# Patient Record
Sex: Female | Born: 1960 | Race: White | Hispanic: No | Marital: Single | Smoking: Never smoker
Health system: Southern US, Community
[De-identification: ages and names within clinical notes are randomized; demographics above are authoritative.]

## PROBLEM LIST (undated history)

## (undated) DIAGNOSIS — F419 Anxiety disorder, unspecified: Secondary | ICD-10-CM

## (undated) DIAGNOSIS — L309 Dermatitis, unspecified: Secondary | ICD-10-CM

## (undated) DIAGNOSIS — G709 Myoneural disorder, unspecified: Secondary | ICD-10-CM

## (undated) DIAGNOSIS — I890 Lymphedema, not elsewhere classified: Secondary | ICD-10-CM

## (undated) DIAGNOSIS — R51 Headache: Secondary | ICD-10-CM

## (undated) DIAGNOSIS — F32A Depression, unspecified: Secondary | ICD-10-CM

## (undated) DIAGNOSIS — C50919 Malignant neoplasm of unspecified site of unspecified female breast: Secondary | ICD-10-CM

## (undated) DIAGNOSIS — G4730 Sleep apnea, unspecified: Secondary | ICD-10-CM

## (undated) DIAGNOSIS — R350 Frequency of micturition: Secondary | ICD-10-CM

## (undated) DIAGNOSIS — R519 Headache, unspecified: Secondary | ICD-10-CM

## (undated) DIAGNOSIS — E079 Disorder of thyroid, unspecified: Secondary | ICD-10-CM

## (undated) DIAGNOSIS — K219 Gastro-esophageal reflux disease without esophagitis: Secondary | ICD-10-CM

## (undated) DIAGNOSIS — E669 Obesity, unspecified: Secondary | ICD-10-CM

## (undated) DIAGNOSIS — G629 Polyneuropathy, unspecified: Secondary | ICD-10-CM

## (undated) DIAGNOSIS — F329 Major depressive disorder, single episode, unspecified: Secondary | ICD-10-CM

## (undated) HISTORY — DX: Anxiety disorder, unspecified: F41.9

## (undated) HISTORY — DX: Obesity, unspecified: E66.9

## (undated) HISTORY — DX: Depression, unspecified: F32.A

## (undated) HISTORY — DX: Dermatitis, unspecified: L30.9

## (undated) HISTORY — DX: Headache: R51

## (undated) HISTORY — DX: Frequency of micturition: R35.0

## (undated) HISTORY — PX: PORTACATH PLACEMENT: SHX2246

## (undated) HISTORY — PX: OOPHORECTOMY: SHX86

## (undated) HISTORY — DX: Myoneural disorder, unspecified: G70.9

## (undated) HISTORY — DX: Major depressive disorder, single episode, unspecified: F32.9

## (undated) HISTORY — PX: OTHER SURGICAL HISTORY: SHX169

## (undated) HISTORY — DX: Malignant neoplasm of unspecified site of unspecified female breast: C50.919

## (undated) HISTORY — DX: Sleep apnea, unspecified: G47.30

## (undated) HISTORY — DX: Gastro-esophageal reflux disease without esophagitis: K21.9

## (undated) HISTORY — DX: Disorder of thyroid, unspecified: E07.9

## (undated) HISTORY — DX: Headache, unspecified: R51.9

---

## 2002-11-04 ENCOUNTER — Other Ambulatory Visit: Admission: RE | Admit: 2002-11-04 | Discharge: 2002-11-04 | Payer: Self-pay | Admitting: Gynecology

## 2003-11-18 ENCOUNTER — Other Ambulatory Visit: Admission: RE | Admit: 2003-11-18 | Discharge: 2003-11-18 | Payer: Self-pay | Admitting: Gynecology

## 2003-11-18 ENCOUNTER — Ambulatory Visit (HOSPITAL_COMMUNITY): Admission: RE | Admit: 2003-11-18 | Discharge: 2003-11-18 | Payer: Self-pay | Admitting: Gynecology

## 2004-10-31 ENCOUNTER — Other Ambulatory Visit: Admission: RE | Admit: 2004-10-31 | Discharge: 2004-10-31 | Payer: Self-pay | Admitting: Family Medicine

## 2005-07-13 ENCOUNTER — Emergency Department (HOSPITAL_COMMUNITY): Admission: EM | Admit: 2005-07-13 | Discharge: 2005-07-13 | Payer: Self-pay | Admitting: Emergency Medicine

## 2005-07-14 ENCOUNTER — Emergency Department (HOSPITAL_COMMUNITY): Admission: EM | Admit: 2005-07-14 | Discharge: 2005-07-14 | Payer: Self-pay | Admitting: Emergency Medicine

## 2010-09-19 ENCOUNTER — Encounter: Admission: RE | Admit: 2010-09-19 | Discharge: 2010-09-19 | Payer: Self-pay | Admitting: Family Medicine

## 2010-10-25 ENCOUNTER — Encounter
Admission: RE | Admit: 2010-10-25 | Discharge: 2010-10-25 | Payer: Self-pay | Source: Home / Self Care | Attending: Obstetrics and Gynecology | Admitting: Obstetrics and Gynecology

## 2010-11-15 ENCOUNTER — Encounter
Admission: RE | Admit: 2010-11-15 | Discharge: 2010-11-15 | Payer: Self-pay | Source: Home / Self Care | Attending: Obstetrics and Gynecology | Admitting: Obstetrics and Gynecology

## 2010-11-22 ENCOUNTER — Ambulatory Visit: Payer: Self-pay | Admitting: Oncology

## 2010-11-25 ENCOUNTER — Ambulatory Visit (HOSPITAL_COMMUNITY)
Admission: RE | Admit: 2010-11-25 | Discharge: 2010-11-25 | Payer: Self-pay | Source: Home / Self Care | Attending: Surgery | Admitting: Surgery

## 2010-11-28 ENCOUNTER — Encounter
Admission: RE | Admit: 2010-11-28 | Discharge: 2010-11-28 | Payer: Self-pay | Source: Home / Self Care | Attending: Surgery | Admitting: Surgery

## 2010-11-28 LAB — GLUCOSE, CAPILLARY: Glucose-Capillary: 94 mg/dL (ref 70–99)

## 2010-11-29 ENCOUNTER — Other Ambulatory Visit: Payer: Self-pay | Admitting: Radiology

## 2010-11-29 ENCOUNTER — Encounter
Admission: RE | Admit: 2010-11-29 | Discharge: 2010-11-29 | Payer: Self-pay | Source: Home / Self Care | Attending: Surgery | Admitting: Surgery

## 2010-11-29 ENCOUNTER — Ambulatory Visit
Admission: RE | Admit: 2010-11-29 | Discharge: 2010-12-02 | Payer: Self-pay | Source: Home / Self Care | Attending: Radiation Oncology | Admitting: Radiation Oncology

## 2010-11-30 ENCOUNTER — Ambulatory Visit (HOSPITAL_COMMUNITY)
Admission: RE | Admit: 2010-11-30 | Discharge: 2010-11-30 | Payer: Self-pay | Source: Home / Self Care | Attending: Surgery | Admitting: Surgery

## 2010-11-30 ENCOUNTER — Ambulatory Visit (HOSPITAL_BASED_OUTPATIENT_CLINIC_OR_DEPARTMENT_OTHER): Payer: Self-pay | Admitting: Genetic Counselor

## 2010-11-30 LAB — CBC WITH DIFFERENTIAL/PLATELET
BASO%: 0.4 % (ref 0.0–2.0)
Basophils Absolute: 0 10*3/uL (ref 0.0–0.1)
EOS%: 1.8 % (ref 0.0–7.0)
Eosinophils Absolute: 0.1 10*3/uL (ref 0.0–0.5)
HCT: 33.8 % — ABNORMAL LOW (ref 34.8–46.6)
HGB: 11.4 g/dL — ABNORMAL LOW (ref 11.6–15.9)
LYMPH%: 25.6 % (ref 14.0–49.7)
MCH: 27.6 pg (ref 25.1–34.0)
MCHC: 33.6 g/dL (ref 31.5–36.0)
MCV: 82 fL (ref 79.5–101.0)
MONO#: 0.3 10*3/uL (ref 0.1–0.9)
MONO%: 6.1 % (ref 0.0–14.0)
NEUT#: 3.7 10*3/uL (ref 1.5–6.5)
NEUT%: 66.1 % (ref 38.4–76.8)
Platelets: 259 10*3/uL (ref 145–400)
RBC: 4.12 10*6/uL (ref 3.70–5.45)
RDW: 14.7 % — ABNORMAL HIGH (ref 11.2–14.5)
WBC: 5.6 10*3/uL (ref 3.9–10.3)
lymph#: 1.4 10*3/uL (ref 0.9–3.3)

## 2010-11-30 LAB — COMPREHENSIVE METABOLIC PANEL
ALT: 10 U/L (ref 0–35)
AST: 11 U/L (ref 0–37)
Albumin: 4.4 g/dL (ref 3.5–5.2)
Alkaline Phosphatase: 50 U/L (ref 39–117)
BUN: 12 mg/dL (ref 6–23)
CO2: 26 mEq/L (ref 19–32)
Calcium: 9.5 mg/dL (ref 8.4–10.5)
Chloride: 105 mEq/L (ref 96–112)
Creatinine, Ser: 0.66 mg/dL (ref 0.40–1.20)
Glucose, Bld: 120 mg/dL — ABNORMAL HIGH (ref 70–99)
Potassium: 4 mEq/L (ref 3.5–5.3)
Sodium: 140 mEq/L (ref 135–145)
Total Bilirubin: 0.2 mg/dL — ABNORMAL LOW (ref 0.3–1.2)
Total Protein: 6.7 g/dL (ref 6.0–8.3)

## 2010-11-30 LAB — CANCER ANTIGEN 27.29: CA 27.29: 17 U/mL (ref 0–39)

## 2010-12-02 ENCOUNTER — Ambulatory Visit (HOSPITAL_COMMUNITY)
Admission: RE | Admit: 2010-12-02 | Discharge: 2010-12-02 | Payer: Self-pay | Source: Home / Self Care | Attending: Oncology | Admitting: Oncology

## 2010-12-06 ENCOUNTER — Ambulatory Visit
Admission: RE | Admit: 2010-12-06 | Discharge: 2010-12-06 | Payer: Self-pay | Source: Home / Self Care | Attending: Surgery | Admitting: Surgery

## 2010-12-07 LAB — POCT HEMOGLOBIN-HEMACUE: Hemoglobin: 12 g/dL (ref 12.0–15.0)

## 2010-12-08 NOTE — Op Note (Addendum)
  Tina Vaughan, Tina Vaughan                   ACCOUNT NO.:  [id number]  MEDICAL RECORD NO.:  [id number]          PATIENT TYPE:  AMB  LOCATION:  DSC                          FACILITY:  [facility]  PHYSICIAN:  Currie Paris, M.D.DATE OF BIRTH:  [birth year]  DATE OF PROCEDURE:  12/06/2010 DATE OF DISCHARGE:                              OPERATIVE REPORT   PREOPERATIVE DIAGNOSIS:  Inflammatory cancer of the left breast.  POSTOPERATIVE DIAGNOSIS:  Inflammatory cancer of the left breast.  PROCEDURE:  Placement of Port-A-Cath.  SURGEON:  Currie Paris, MD  ANESTHESIA:  General.  CLINICAL HISTORY:  This is a 50 year old lady recently diagnosed with inflammatory left breast cancer.  After multiple disciplinary conference and conversations with the patient, she elected to proceed to chemotherapy.  A port was required for chemo.  DESCRIPTION OF PROCEDURE:  I saw the patient in the holding area with her friend and we went over the plans again.  All questions were answered.  The patient was taken to the operating room and after satisfactory general (LMA) anesthesia had been obtained, the patient was placed in Trendelenburg and the upper chest and lower neck prepped and draped. The time-out was done.  I was able to enter the right subclavian vein easily and threaded this guidewire easily and went into the superior vena cava and into the right atrial area confirmed with fluoro.  I made a transverse incision fairly high because the patient has very large breast and is overweight and I am afraid that we would not have a good site for the port where there would be too much fatty tissue or not enough support underneath it.  I fashioned the pocket with cautery. Bleeders were coagulated.  I placed a Port-A-Cath tubing through a tunnel from the port site to the guidewire site.  Using the dilator and peel-away sheath, the tract was dilated once and the guidewire dilator were removed and the  catheter threaded the 20 cm, were aspirated and flushed easily.  Using fluoro, I backed this up to approximately 16-17 cm where it appeared to be in the SVC.  I used some contrast to try to see the catheter better.  The reservoir was flushed, attached, and locking mechanism engaged. This aspirated and flushed easily.  It was placed in the pocket and a final fluoro was done showing what looked like good positioning.  I flushed the catheter with dilute heparin here and then closed the incision with 3-0 Vicryl, 4-0 Monocryl, subcuticular, and Steri-Strips. The reservoir was accessed and a final flush of dilute followed by concentrated aqueous heparin done and then the tubing for the IV locked so that they could be used for chemo tomorrow.  The patient tolerated the procedure well and there were no complications.  All counts were correct.     Currie Paris, M.D.     CJS/MEDQ  D:  12/06/2010  T:  12/07/2010  Job:  [id number]  cc:   Naima A. Normand Sloop, M.D. Pierce Crane, MD  Electronically Signed by Cyndia Bent M.D. on 12/08/2010 07:30:41 AM

## 2010-12-14 ENCOUNTER — Ambulatory Visit: Payer: Self-pay | Admitting: Radiation Oncology

## 2010-12-14 ENCOUNTER — Encounter (HOSPITAL_BASED_OUTPATIENT_CLINIC_OR_DEPARTMENT_OTHER): Payer: Self-pay | Admitting: Oncology

## 2010-12-14 DIAGNOSIS — C50919 Malignant neoplasm of unspecified site of unspecified female breast: Secondary | ICD-10-CM

## 2010-12-14 LAB — BASIC METABOLIC PANEL
BUN: 10 mg/dL (ref 6–23)
CO2: 27 mEq/L (ref 19–32)
Calcium: 9.2 mg/dL (ref 8.4–10.5)
Chloride: 103 mEq/L (ref 96–112)
Creatinine, Ser: 0.6 mg/dL (ref 0.40–1.20)
Glucose, Bld: 85 mg/dL (ref 70–99)
Potassium: 4.2 mEq/L (ref 3.5–5.3)
Sodium: 137 mEq/L (ref 135–145)

## 2010-12-14 LAB — CBC WITH DIFFERENTIAL/PLATELET
BASO%: 1.3 % (ref 0.0–2.0)
Basophils Absolute: 0 10*3/uL (ref 0.0–0.1)
EOS%: 7.3 % — ABNORMAL HIGH (ref 0.0–7.0)
Eosinophils Absolute: 0.1 10*3/uL (ref 0.0–0.5)
HCT: 32.5 % — ABNORMAL LOW (ref 34.8–46.6)
HGB: 11 g/dL — ABNORMAL LOW (ref 11.6–15.9)
LYMPH%: 51.4 % — ABNORMAL HIGH (ref 14.0–49.7)
MCH: 27.6 pg (ref 25.1–34.0)
MCHC: 34 g/dL (ref 31.5–36.0)
MCV: 81.2 fL (ref 79.5–101.0)
MONO#: 0.1 10*3/uL (ref 0.1–0.9)
MONO%: 7.9 % (ref 0.0–14.0)
NEUT#: 0.4 10*3/uL — CL (ref 1.5–6.5)
NEUT%: 32.1 % — ABNORMAL LOW (ref 38.4–76.8)
Platelets: 145 10*3/uL (ref 145–400)
RBC: 3.99 10*6/uL (ref 3.70–5.45)
RDW: 15.3 % — ABNORMAL HIGH (ref 11.2–14.5)
WBC: 1.2 10*3/uL — ABNORMAL LOW (ref 3.9–10.3)
lymph#: 0.6 10*3/uL — ABNORMAL LOW (ref 0.9–3.3)

## 2010-12-21 ENCOUNTER — Other Ambulatory Visit: Payer: Self-pay | Admitting: Physician Assistant

## 2010-12-21 ENCOUNTER — Encounter (HOSPITAL_BASED_OUTPATIENT_CLINIC_OR_DEPARTMENT_OTHER): Payer: Self-pay | Admitting: Oncology

## 2010-12-21 DIAGNOSIS — Z5111 Encounter for antineoplastic chemotherapy: Secondary | ICD-10-CM

## 2010-12-21 DIAGNOSIS — C50919 Malignant neoplasm of unspecified site of unspecified female breast: Secondary | ICD-10-CM

## 2010-12-21 LAB — CBC WITH DIFFERENTIAL/PLATELET
BASO%: 1.3 % (ref 0.0–2.0)
Basophils Absolute: 0.1 10*3/uL (ref 0.0–0.1)
EOS%: 0.3 % (ref 0.0–7.0)
Eosinophils Absolute: 0 10*3/uL (ref 0.0–0.5)
HCT: 30.1 % — ABNORMAL LOW (ref 34.8–46.6)
HGB: 10.4 g/dL — ABNORMAL LOW (ref 11.6–15.9)
LYMPH%: 24.4 % (ref 14.0–49.7)
MCH: 27.9 pg (ref 25.1–34.0)
MCHC: 34.5 g/dL (ref 31.5–36.0)
MCV: 80.9 fL (ref 79.5–101.0)
MONO#: 0.2 10*3/uL (ref 0.1–0.9)
MONO%: 2.3 % (ref 0.0–14.0)
NEUT#: 5.1 10*3/uL (ref 1.5–6.5)
NEUT%: 71.7 % (ref 38.4–76.8)
Platelets: 171 10*3/uL (ref 145–400)
RBC: 3.73 10*6/uL (ref 3.70–5.45)
RDW: 15.2 % — ABNORMAL HIGH (ref 11.2–14.5)
WBC: 7.2 10*3/uL (ref 3.9–10.3)
lymph#: 1.8 10*3/uL (ref 0.9–3.3)

## 2010-12-21 LAB — COMPREHENSIVE METABOLIC PANEL
ALT: 13 U/L (ref 0–35)
AST: 12 U/L (ref 0–37)
Albumin: 3.9 g/dL (ref 3.5–5.2)
Alkaline Phosphatase: 49 U/L (ref 39–117)
BUN: 12 mg/dL (ref 6–23)
CO2: 24 mEq/L (ref 19–32)
Calcium: 8.7 mg/dL (ref 8.4–10.5)
Chloride: 104 mEq/L (ref 96–112)
Creatinine, Ser: 0.63 mg/dL (ref 0.40–1.20)
Glucose, Bld: 90 mg/dL (ref 70–99)
Potassium: 4.2 mEq/L (ref 3.5–5.3)
Sodium: 136 mEq/L (ref 135–145)
Total Bilirubin: 0.2 mg/dL — ABNORMAL LOW (ref 0.3–1.2)
Total Protein: 6.2 g/dL (ref 6.0–8.3)

## 2010-12-22 ENCOUNTER — Encounter (HOSPITAL_BASED_OUTPATIENT_CLINIC_OR_DEPARTMENT_OTHER): Payer: Self-pay | Admitting: Oncology

## 2010-12-22 DIAGNOSIS — C50919 Malignant neoplasm of unspecified site of unspecified female breast: Secondary | ICD-10-CM

## 2010-12-22 DIAGNOSIS — Z5189 Encounter for other specified aftercare: Secondary | ICD-10-CM

## 2010-12-28 ENCOUNTER — Encounter (HOSPITAL_BASED_OUTPATIENT_CLINIC_OR_DEPARTMENT_OTHER): Payer: Self-pay | Admitting: Oncology

## 2010-12-28 ENCOUNTER — Other Ambulatory Visit: Payer: Self-pay | Admitting: Physician Assistant

## 2010-12-28 DIAGNOSIS — C50919 Malignant neoplasm of unspecified site of unspecified female breast: Secondary | ICD-10-CM

## 2010-12-28 LAB — CBC WITH DIFFERENTIAL/PLATELET
BASO%: 1.5 % (ref 0.0–2.0)
Basophils Absolute: 0.1 10*3/uL (ref 0.0–0.1)
EOS%: 0.6 % (ref 0.0–7.0)
Eosinophils Absolute: 0 10*3/uL (ref 0.0–0.5)
HCT: 30.1 % — ABNORMAL LOW (ref 34.8–46.6)
HGB: 10.1 g/dL — ABNORMAL LOW (ref 11.6–15.9)
LYMPH%: 27 % (ref 14.0–49.7)
MCH: 27.5 pg (ref 25.1–34.0)
MCHC: 33.6 g/dL (ref 31.5–36.0)
MCV: 82 fL (ref 79.5–101.0)
MONO#: 0.3 10*3/uL (ref 0.1–0.9)
MONO%: 6.4 % (ref 0.0–14.0)
NEUT#: 2.5 10*3/uL (ref 1.5–6.5)
NEUT%: 64.5 % (ref 38.4–76.8)
Platelets: 173 10*3/uL (ref 145–400)
RBC: 3.67 10*6/uL — ABNORMAL LOW (ref 3.70–5.45)
RDW: 15.9 % — ABNORMAL HIGH (ref 11.2–14.5)
WBC: 4 10*3/uL (ref 3.9–10.3)
lymph#: 1.1 10*3/uL (ref 0.9–3.3)

## 2011-01-04 ENCOUNTER — Other Ambulatory Visit: Payer: Self-pay | Admitting: Physician Assistant

## 2011-01-04 ENCOUNTER — Encounter (HOSPITAL_BASED_OUTPATIENT_CLINIC_OR_DEPARTMENT_OTHER): Payer: Self-pay | Admitting: Oncology

## 2011-01-04 DIAGNOSIS — Z170 Estrogen receptor positive status [ER+]: Secondary | ICD-10-CM

## 2011-01-04 DIAGNOSIS — Z5111 Encounter for antineoplastic chemotherapy: Secondary | ICD-10-CM

## 2011-01-04 DIAGNOSIS — C50919 Malignant neoplasm of unspecified site of unspecified female breast: Secondary | ICD-10-CM

## 2011-01-04 LAB — CBC WITH DIFFERENTIAL/PLATELET
BASO%: 0.5 % (ref 0.0–2.0)
Basophils Absolute: 0 10*3/uL (ref 0.0–0.1)
EOS%: 0.1 % (ref 0.0–7.0)
Eosinophils Absolute: 0 10*3/uL (ref 0.0–0.5)
HCT: 31.3 % — ABNORMAL LOW (ref 34.8–46.6)
HGB: 10.1 g/dL — ABNORMAL LOW (ref 11.6–15.9)
LYMPH%: 15.6 % (ref 14.0–49.7)
MCH: 26.8 pg (ref 25.1–34.0)
MCHC: 32.3 g/dL (ref 31.5–36.0)
MCV: 83 fL (ref 79.5–101.0)
MONO#: 0.6 10*3/uL (ref 0.1–0.9)
MONO%: 6.4 % (ref 0.0–14.0)
NEUT#: 6.7 10*3/uL — ABNORMAL HIGH (ref 1.5–6.5)
NEUT%: 77.4 % — ABNORMAL HIGH (ref 38.4–76.8)
Platelets: 117 10*3/uL — ABNORMAL LOW (ref 145–400)
RBC: 3.77 10*6/uL (ref 3.70–5.45)
RDW: 16.2 % — ABNORMAL HIGH (ref 11.2–14.5)
WBC: 8.7 10*3/uL (ref 3.9–10.3)
lymph#: 1.4 10*3/uL (ref 0.9–3.3)
nRBC: 1 % — ABNORMAL HIGH (ref 0–0)

## 2011-01-04 LAB — COMPREHENSIVE METABOLIC PANEL
ALT: 12 U/L (ref 0–35)
AST: 11 U/L (ref 0–37)
Albumin: 4 g/dL (ref 3.5–5.2)
Alkaline Phosphatase: 52 U/L (ref 39–117)
BUN: 10 mg/dL (ref 6–23)
CO2: 23 mEq/L (ref 19–32)
Calcium: 8.9 mg/dL (ref 8.4–10.5)
Chloride: 105 mEq/L (ref 96–112)
Creatinine, Ser: 0.6 mg/dL (ref 0.40–1.20)
Glucose, Bld: 91 mg/dL (ref 70–99)
Potassium: 4 mEq/L (ref 3.5–5.3)
Sodium: 138 mEq/L (ref 135–145)
Total Bilirubin: 0.4 mg/dL (ref 0.3–1.2)
Total Protein: 5.9 g/dL — ABNORMAL LOW (ref 6.0–8.3)

## 2011-01-05 ENCOUNTER — Encounter (HOSPITAL_BASED_OUTPATIENT_CLINIC_OR_DEPARTMENT_OTHER): Payer: Self-pay | Admitting: Oncology

## 2011-01-05 DIAGNOSIS — C50919 Malignant neoplasm of unspecified site of unspecified female breast: Secondary | ICD-10-CM

## 2011-01-05 DIAGNOSIS — Z170 Estrogen receptor positive status [ER+]: Secondary | ICD-10-CM

## 2011-01-11 ENCOUNTER — Other Ambulatory Visit: Payer: Self-pay | Admitting: Physician Assistant

## 2011-01-11 ENCOUNTER — Encounter (HOSPITAL_BASED_OUTPATIENT_CLINIC_OR_DEPARTMENT_OTHER): Payer: Self-pay | Admitting: Oncology

## 2011-01-11 DIAGNOSIS — C50919 Malignant neoplasm of unspecified site of unspecified female breast: Secondary | ICD-10-CM

## 2011-01-11 DIAGNOSIS — Z170 Estrogen receptor positive status [ER+]: Secondary | ICD-10-CM

## 2011-01-11 LAB — CBC WITH DIFFERENTIAL/PLATELET
BASO%: 0.4 % (ref 0.0–2.0)
Basophils Absolute: 0 10*3/uL (ref 0.0–0.1)
EOS%: 0.6 % (ref 0.0–7.0)
Eosinophils Absolute: 0 10*3/uL (ref 0.0–0.5)
HCT: 29.3 % — ABNORMAL LOW (ref 34.8–46.6)
HGB: 9.9 g/dL — ABNORMAL LOW (ref 11.6–15.9)
LYMPH%: 15 % (ref 14.0–49.7)
MCH: 27.8 pg (ref 25.1–34.0)
MCHC: 34 g/dL (ref 31.5–36.0)
MCV: 81.6 fL (ref 79.5–101.0)
MONO#: 0.2 10*3/uL (ref 0.1–0.9)
MONO%: 4.7 % (ref 0.0–14.0)
NEUT#: 3.4 10*3/uL (ref 1.5–6.5)
NEUT%: 79.3 % — ABNORMAL HIGH (ref 38.4–76.8)
Platelets: 145 10*3/uL (ref 145–400)
RBC: 3.58 10*6/uL — ABNORMAL LOW (ref 3.70–5.45)
RDW: 17.4 % — ABNORMAL HIGH (ref 11.2–14.5)
WBC: 4.3 10*3/uL (ref 3.9–10.3)
lymph#: 0.6 10*3/uL — ABNORMAL LOW (ref 0.9–3.3)

## 2011-01-18 ENCOUNTER — Other Ambulatory Visit: Payer: Self-pay | Admitting: Physician Assistant

## 2011-01-18 ENCOUNTER — Other Ambulatory Visit: Payer: Self-pay | Admitting: Oncology

## 2011-01-18 ENCOUNTER — Encounter (HOSPITAL_BASED_OUTPATIENT_CLINIC_OR_DEPARTMENT_OTHER): Payer: Self-pay | Admitting: Oncology

## 2011-01-18 DIAGNOSIS — C50919 Malignant neoplasm of unspecified site of unspecified female breast: Secondary | ICD-10-CM

## 2011-01-18 DIAGNOSIS — Z170 Estrogen receptor positive status [ER+]: Secondary | ICD-10-CM

## 2011-01-18 DIAGNOSIS — Z5111 Encounter for antineoplastic chemotherapy: Secondary | ICD-10-CM

## 2011-01-18 LAB — URINALYSIS, MICROSCOPIC - CHCC
Bilirubin (Urine): NEGATIVE
Blood: NEGATIVE
Glucose: NEGATIVE g/dL
Ketones: NEGATIVE mg/dL
Nitrite: NEGATIVE
Protein: <30 mg/dL
Specific Gravity, Urine: 1.015 (ref 1.003–1.035)
pH: 7.5 (ref 4.6–8.0)

## 2011-01-18 LAB — CBC WITH DIFFERENTIAL/PLATELET
BASO%: 0.5 % (ref 0.0–2.0)
Basophils Absolute: 0.1 10*3/uL (ref 0.0–0.1)
EOS%: 0.3 % (ref 0.0–7.0)
Eosinophils Absolute: 0 10*3/uL (ref 0.0–0.5)
HCT: 31 % — ABNORMAL LOW (ref 34.8–46.6)
HGB: 10 g/dL — ABNORMAL LOW (ref 11.6–15.9)
LYMPH%: 11.6 % — ABNORMAL LOW (ref 14.0–49.7)
MCH: 26.8 pg (ref 25.1–34.0)
MCHC: 32.3 g/dL (ref 31.5–36.0)
MCV: 83.1 fL (ref 79.5–101.0)
MONO#: 0.5 10*3/uL (ref 0.1–0.9)
MONO%: 4.6 % (ref 0.0–14.0)
NEUT#: 9.2 10*3/uL — ABNORMAL HIGH (ref 1.5–6.5)
NEUT%: 83 % — ABNORMAL HIGH (ref 38.4–76.8)
Platelets: 151 10*3/uL (ref 145–400)
RBC: 3.73 10*6/uL (ref 3.70–5.45)
RDW: 17.5 % — ABNORMAL HIGH (ref 11.2–14.5)
WBC: 11.1 10*3/uL — ABNORMAL HIGH (ref 3.9–10.3)
lymph#: 1.3 10*3/uL (ref 0.9–3.3)
nRBC: 1 % — ABNORMAL HIGH (ref 0–0)

## 2011-01-18 LAB — COMPREHENSIVE METABOLIC PANEL
ALT: 12 U/L (ref 0–35)
AST: 12 U/L (ref 0–37)
Albumin: 4.2 g/dL (ref 3.5–5.2)
Alkaline Phosphatase: 56 U/L (ref 39–117)
BUN: 8 mg/dL (ref 6–23)
CO2: 23 mEq/L (ref 19–32)
Calcium: 8.5 mg/dL (ref 8.4–10.5)
Chloride: 105 mEq/L (ref 96–112)
Creatinine, Ser: 0.67 mg/dL (ref 0.40–1.20)
Glucose, Bld: 102 mg/dL — ABNORMAL HIGH (ref 70–99)
Potassium: 4.1 mEq/L (ref 3.5–5.3)
Sodium: 139 mEq/L (ref 135–145)
Total Bilirubin: 0.3 mg/dL (ref 0.3–1.2)
Total Protein: 6.2 g/dL (ref 6.0–8.3)

## 2011-01-19 ENCOUNTER — Encounter (HOSPITAL_BASED_OUTPATIENT_CLINIC_OR_DEPARTMENT_OTHER): Payer: Self-pay | Admitting: Oncology

## 2011-01-19 DIAGNOSIS — C50919 Malignant neoplasm of unspecified site of unspecified female breast: Secondary | ICD-10-CM

## 2011-01-19 DIAGNOSIS — Z170 Estrogen receptor positive status [ER+]: Secondary | ICD-10-CM

## 2011-01-19 LAB — URINE CULTURE

## 2011-01-21 ENCOUNTER — Inpatient Hospital Stay (HOSPITAL_COMMUNITY): Admission: RE | Admit: 2011-01-21 | Payer: Self-pay | Source: Ambulatory Visit

## 2011-01-22 ENCOUNTER — Ambulatory Visit (HOSPITAL_COMMUNITY)
Admission: RE | Admit: 2011-01-22 | Discharge: 2011-01-22 | Disposition: A | Discharge status: Home / Self Care | Payer: Self-pay | Source: Ambulatory Visit | Attending: Oncology | Admitting: Oncology

## 2011-01-22 DIAGNOSIS — C50419 Malignant neoplasm of upper-outer quadrant of unspecified female breast: Secondary | ICD-10-CM | POA: Insufficient documentation

## 2011-01-22 DIAGNOSIS — C50519 Malignant neoplasm of lower-outer quadrant of unspecified female breast: Secondary | ICD-10-CM | POA: Insufficient documentation

## 2011-01-22 DIAGNOSIS — C773 Secondary and unspecified malignant neoplasm of axilla and upper limb lymph nodes: Secondary | ICD-10-CM | POA: Insufficient documentation

## 2011-01-22 MED ORDER — GADOBENATE DIMEGLUMINE 529 MG/ML IV SOLN
20.0000 mL | Freq: Once | INTRAVENOUS | Status: AC | PRN
  Administered 2011-01-22: 20 mL via INTRAVENOUS

## 2011-01-23 ENCOUNTER — Other Ambulatory Visit: Payer: Self-pay | Admitting: Physician Assistant

## 2011-01-23 ENCOUNTER — Encounter (HOSPITAL_BASED_OUTPATIENT_CLINIC_OR_DEPARTMENT_OTHER): Payer: Self-pay | Admitting: Oncology

## 2011-01-23 DIAGNOSIS — C50919 Malignant neoplasm of unspecified site of unspecified female breast: Secondary | ICD-10-CM

## 2011-01-23 DIAGNOSIS — Z170 Estrogen receptor positive status [ER+]: Secondary | ICD-10-CM

## 2011-01-23 LAB — CBC WITH DIFFERENTIAL/PLATELET
BASO%: 0.1 % (ref 0.0–2.0)
Basophils Absolute: 0 10*3/uL (ref 0.0–0.1)
Eosinophils Absolute: 0 10*3/uL (ref 0.0–0.5)
HCT: 30 % — ABNORMAL LOW (ref 34.8–46.6)
HGB: 10.1 g/dL — ABNORMAL LOW (ref 11.6–15.9)
LYMPH%: 5.3 % — ABNORMAL LOW (ref 14.0–49.7)
MCHC: 33.5 g/dL (ref 31.5–36.0)
MONO#: 0.1 10*3/uL (ref 0.1–0.9)
NEUT%: 94.1 % — ABNORMAL HIGH (ref 38.4–76.8)
Platelets: 176 10*3/uL (ref 145–400)
WBC: 14.9 10*3/uL — ABNORMAL HIGH (ref 3.9–10.3)
lymph#: 0.8 10*3/uL — ABNORMAL LOW (ref 0.9–3.3)

## 2011-02-01 ENCOUNTER — Other Ambulatory Visit: Payer: Self-pay | Admitting: Physician Assistant

## 2011-02-01 ENCOUNTER — Encounter (HOSPITAL_BASED_OUTPATIENT_CLINIC_OR_DEPARTMENT_OTHER): Payer: Self-pay | Admitting: Oncology

## 2011-02-01 DIAGNOSIS — Z170 Estrogen receptor positive status [ER+]: Secondary | ICD-10-CM

## 2011-02-01 DIAGNOSIS — C50919 Malignant neoplasm of unspecified site of unspecified female breast: Secondary | ICD-10-CM

## 2011-02-01 DIAGNOSIS — Z5111 Encounter for antineoplastic chemotherapy: Secondary | ICD-10-CM

## 2011-02-01 DIAGNOSIS — Z5189 Encounter for other specified aftercare: Secondary | ICD-10-CM

## 2011-02-01 LAB — CBC WITH DIFFERENTIAL/PLATELET
BASO%: 0.1 % (ref 0.0–2.0)
Basophils Absolute: 0 10*3/uL (ref 0.0–0.1)
EOS%: 0.8 % (ref 0.0–7.0)
HCT: 30 % — ABNORMAL LOW (ref 34.8–46.6)
HGB: 10.2 g/dL — ABNORMAL LOW (ref 11.6–15.9)
LYMPH%: 16 % (ref 14.0–49.7)
MCH: 28.3 pg (ref 25.1–34.0)
MCHC: 34 g/dL (ref 31.5–36.0)
MCV: 83.2 fL (ref 79.5–101.0)
NEUT%: 79.7 % — ABNORMAL HIGH (ref 38.4–76.8)
Platelets: 125 10*3/uL — ABNORMAL LOW (ref 145–400)
lymph#: 1.6 10*3/uL (ref 0.9–3.3)

## 2011-02-01 LAB — COMPREHENSIVE METABOLIC PANEL
AST: 16 U/L (ref 0–37)
BUN: 8 mg/dL (ref 6–23)
Calcium: 9.3 mg/dL (ref 8.4–10.5)
Chloride: 105 mEq/L (ref 96–112)
Creatinine, Ser: 0.76 mg/dL (ref 0.40–1.20)
Total Bilirubin: 0.4 mg/dL (ref 0.3–1.2)

## 2011-02-02 ENCOUNTER — Encounter (HOSPITAL_BASED_OUTPATIENT_CLINIC_OR_DEPARTMENT_OTHER): Payer: Self-pay | Admitting: Oncology

## 2011-02-02 DIAGNOSIS — C50919 Malignant neoplasm of unspecified site of unspecified female breast: Secondary | ICD-10-CM

## 2011-02-02 DIAGNOSIS — Z170 Estrogen receptor positive status [ER+]: Secondary | ICD-10-CM

## 2011-02-08 ENCOUNTER — Other Ambulatory Visit: Payer: Self-pay | Admitting: Physician Assistant

## 2011-02-08 ENCOUNTER — Encounter (HOSPITAL_BASED_OUTPATIENT_CLINIC_OR_DEPARTMENT_OTHER): Payer: Self-pay | Admitting: Oncology

## 2011-02-08 DIAGNOSIS — C50919 Malignant neoplasm of unspecified site of unspecified female breast: Secondary | ICD-10-CM

## 2011-02-08 DIAGNOSIS — Z170 Estrogen receptor positive status [ER+]: Secondary | ICD-10-CM

## 2011-02-08 DIAGNOSIS — Z5189 Encounter for other specified aftercare: Secondary | ICD-10-CM

## 2011-02-08 DIAGNOSIS — Z5111 Encounter for antineoplastic chemotherapy: Secondary | ICD-10-CM

## 2011-02-08 LAB — CBC WITH DIFFERENTIAL/PLATELET
Basophils Absolute: 0 10*3/uL (ref 0.0–0.1)
HCT: 29.3 % — ABNORMAL LOW (ref 34.8–46.6)
HGB: 9.5 g/dL — ABNORMAL LOW (ref 11.6–15.9)
LYMPH%: 17.5 % (ref 14.0–49.7)
MCH: 27.2 pg (ref 25.1–34.0)
MONO#: 0.1 10*3/uL (ref 0.1–0.9)
NEUT%: 77.5 % — ABNORMAL HIGH (ref 38.4–76.8)
Platelets: 94 10*3/uL — ABNORMAL LOW (ref 145–400)
WBC: 3 10*3/uL — ABNORMAL LOW (ref 3.9–10.3)
lymph#: 0.5 10*3/uL — ABNORMAL LOW (ref 0.9–3.3)

## 2011-02-15 ENCOUNTER — Other Ambulatory Visit: Payer: Self-pay | Admitting: Oncology

## 2011-02-15 ENCOUNTER — Encounter (HOSPITAL_BASED_OUTPATIENT_CLINIC_OR_DEPARTMENT_OTHER): Payer: Self-pay | Admitting: Oncology

## 2011-02-15 DIAGNOSIS — Z5111 Encounter for antineoplastic chemotherapy: Secondary | ICD-10-CM

## 2011-02-15 DIAGNOSIS — Z170 Estrogen receptor positive status [ER+]: Secondary | ICD-10-CM

## 2011-02-15 DIAGNOSIS — C50919 Malignant neoplasm of unspecified site of unspecified female breast: Secondary | ICD-10-CM

## 2011-02-15 LAB — CBC WITH DIFFERENTIAL/PLATELET
Basophils Absolute: 0.1 10*3/uL (ref 0.0–0.1)
EOS%: 0.2 % (ref 0.0–7.0)
HCT: 30.4 % — ABNORMAL LOW (ref 34.8–46.6)
HGB: 10.5 g/dL — ABNORMAL LOW (ref 11.6–15.9)
LYMPH%: 10.9 % — ABNORMAL LOW (ref 14.0–49.7)
MCH: 29.1 pg (ref 25.1–34.0)
MCV: 84.7 fL (ref 79.5–101.0)
MONO%: 1.4 % (ref 0.0–14.0)
NEUT%: 86.5 % — ABNORMAL HIGH (ref 38.4–76.8)

## 2011-02-15 LAB — COMPREHENSIVE METABOLIC PANEL
AST: 15 U/L (ref 0–37)
Alkaline Phosphatase: 54 U/L (ref 39–117)
BUN: 10 mg/dL (ref 6–23)
Calcium: 9.4 mg/dL (ref 8.4–10.5)
Creatinine, Ser: 0.7 mg/dL (ref 0.40–1.20)
Total Bilirubin: 0.4 mg/dL (ref 0.3–1.2)

## 2011-02-16 ENCOUNTER — Encounter (HOSPITAL_BASED_OUTPATIENT_CLINIC_OR_DEPARTMENT_OTHER): Payer: Self-pay | Admitting: Oncology

## 2011-02-16 DIAGNOSIS — Z170 Estrogen receptor positive status [ER+]: Secondary | ICD-10-CM

## 2011-02-16 DIAGNOSIS — C50919 Malignant neoplasm of unspecified site of unspecified female breast: Secondary | ICD-10-CM

## 2011-02-22 ENCOUNTER — Other Ambulatory Visit: Payer: Self-pay | Admitting: Physician Assistant

## 2011-02-22 ENCOUNTER — Encounter (HOSPITAL_BASED_OUTPATIENT_CLINIC_OR_DEPARTMENT_OTHER): Payer: Self-pay | Admitting: Oncology

## 2011-02-22 DIAGNOSIS — C50919 Malignant neoplasm of unspecified site of unspecified female breast: Secondary | ICD-10-CM

## 2011-02-22 DIAGNOSIS — Z5111 Encounter for antineoplastic chemotherapy: Secondary | ICD-10-CM

## 2011-02-22 DIAGNOSIS — Z170 Estrogen receptor positive status [ER+]: Secondary | ICD-10-CM

## 2011-02-22 LAB — CBC WITH DIFFERENTIAL/PLATELET
Eosinophils Absolute: 0 10*3/uL (ref 0.0–0.5)
LYMPH%: 14.4 % (ref 14.0–49.7)
MONO#: 0.1 10*3/uL (ref 0.1–0.9)
NEUT#: 3.4 10*3/uL (ref 1.5–6.5)
Platelets: 132 10*3/uL — ABNORMAL LOW (ref 145–400)
RBC: 3.21 10*6/uL — ABNORMAL LOW (ref 3.70–5.45)
WBC: 4.2 10*3/uL (ref 3.9–10.3)

## 2011-03-01 ENCOUNTER — Other Ambulatory Visit: Payer: Self-pay | Admitting: Physician Assistant

## 2011-03-01 ENCOUNTER — Encounter (HOSPITAL_BASED_OUTPATIENT_CLINIC_OR_DEPARTMENT_OTHER): Payer: Self-pay | Admitting: Oncology

## 2011-03-01 DIAGNOSIS — Z170 Estrogen receptor positive status [ER+]: Secondary | ICD-10-CM

## 2011-03-01 DIAGNOSIS — C50919 Malignant neoplasm of unspecified site of unspecified female breast: Secondary | ICD-10-CM

## 2011-03-01 DIAGNOSIS — Z5111 Encounter for antineoplastic chemotherapy: Secondary | ICD-10-CM

## 2011-03-01 LAB — CBC WITH DIFFERENTIAL/PLATELET
Basophils Absolute: 0.1 10*3/uL (ref 0.0–0.1)
Eosinophils Absolute: 0 10*3/uL (ref 0.0–0.5)
HCT: 31.1 % — ABNORMAL LOW (ref 34.8–46.6)
LYMPH%: 4.4 % — ABNORMAL LOW (ref 14.0–49.7)
MCHC: 32.5 g/dL (ref 31.5–36.0)
MONO#: 0.7 10*3/uL (ref 0.1–0.9)
NEUT#: 19.1 10*3/uL — ABNORMAL HIGH (ref 1.5–6.5)
NEUT%: 92.2 % — ABNORMAL HIGH (ref 38.4–76.8)
Platelets: 128 10*3/uL — ABNORMAL LOW (ref 145–400)
WBC: 20.7 10*3/uL — ABNORMAL HIGH (ref 3.9–10.3)

## 2011-03-01 LAB — COMPREHENSIVE METABOLIC PANEL
BUN: 12 mg/dL (ref 6–23)
CO2: 19 mEq/L (ref 19–32)
Calcium: 9.6 mg/dL (ref 8.4–10.5)
Chloride: 105 mEq/L (ref 96–112)
Creatinine, Ser: 0.63 mg/dL (ref 0.40–1.20)
Glucose, Bld: 149 mg/dL — ABNORMAL HIGH (ref 70–99)

## 2011-03-02 ENCOUNTER — Encounter (HOSPITAL_BASED_OUTPATIENT_CLINIC_OR_DEPARTMENT_OTHER): Payer: Self-pay | Admitting: Oncology

## 2011-03-02 DIAGNOSIS — C50919 Malignant neoplasm of unspecified site of unspecified female breast: Secondary | ICD-10-CM

## 2011-03-02 DIAGNOSIS — Z170 Estrogen receptor positive status [ER+]: Secondary | ICD-10-CM

## 2011-03-08 ENCOUNTER — Encounter (HOSPITAL_BASED_OUTPATIENT_CLINIC_OR_DEPARTMENT_OTHER): Payer: Self-pay | Admitting: Oncology

## 2011-03-08 ENCOUNTER — Other Ambulatory Visit: Payer: Self-pay | Admitting: Physician Assistant

## 2011-03-08 DIAGNOSIS — Z170 Estrogen receptor positive status [ER+]: Secondary | ICD-10-CM

## 2011-03-08 DIAGNOSIS — C50919 Malignant neoplasm of unspecified site of unspecified female breast: Secondary | ICD-10-CM

## 2011-03-08 DIAGNOSIS — C773 Secondary and unspecified malignant neoplasm of axilla and upper limb lymph nodes: Secondary | ICD-10-CM

## 2011-03-08 DIAGNOSIS — Z1501 Genetic susceptibility to malignant neoplasm of breast: Secondary | ICD-10-CM

## 2011-03-08 LAB — CBC WITH DIFFERENTIAL/PLATELET
BASO%: 1.9 % (ref 0.0–2.0)
Basophils Absolute: 0.2 10*3/uL — ABNORMAL HIGH (ref 0.0–0.1)
HCT: 29.9 % — ABNORMAL LOW (ref 34.8–46.6)
HGB: 9.9 g/dL — ABNORMAL LOW (ref 11.6–15.9)
MONO#: 1.1 10*3/uL — ABNORMAL HIGH (ref 0.1–0.9)
NEUT%: 75.1 % (ref 38.4–76.8)
RDW: 20.5 % — ABNORMAL HIGH (ref 11.2–14.5)
WBC: 10.3 10*3/uL (ref 3.9–10.3)
lymph#: 1.2 10*3/uL (ref 0.9–3.3)

## 2011-03-15 ENCOUNTER — Other Ambulatory Visit: Payer: Self-pay | Admitting: Physician Assistant

## 2011-03-15 ENCOUNTER — Encounter (HOSPITAL_BASED_OUTPATIENT_CLINIC_OR_DEPARTMENT_OTHER): Payer: Self-pay | Admitting: Oncology

## 2011-03-15 DIAGNOSIS — Z170 Estrogen receptor positive status [ER+]: Secondary | ICD-10-CM

## 2011-03-15 DIAGNOSIS — C50919 Malignant neoplasm of unspecified site of unspecified female breast: Secondary | ICD-10-CM

## 2011-03-15 DIAGNOSIS — Z5111 Encounter for antineoplastic chemotherapy: Secondary | ICD-10-CM

## 2011-03-15 LAB — CBC WITH DIFFERENTIAL/PLATELET
Basophils Absolute: 0 10*3/uL (ref 0.0–0.1)
EOS%: 0 % (ref 0.0–7.0)
Eosinophils Absolute: 0 10*3/uL (ref 0.0–0.5)
HCT: 34.6 % — ABNORMAL LOW (ref 34.8–46.6)
HGB: 11.1 g/dL — ABNORMAL LOW (ref 11.6–15.9)
MCH: 28.2 pg (ref 25.1–34.0)
NEUT#: 24.6 10*3/uL — ABNORMAL HIGH (ref 1.5–6.5)
NEUT%: 94.2 % — ABNORMAL HIGH (ref 38.4–76.8)
lymph#: 1 10*3/uL (ref 0.9–3.3)

## 2011-03-15 LAB — COMPREHENSIVE METABOLIC PANEL
Albumin: 4.8 g/dL (ref 3.5–5.2)
Alkaline Phosphatase: 73 U/L (ref 39–117)
BUN: 13 mg/dL (ref 6–23)
CO2: 19 mEq/L (ref 19–32)
Calcium: 10.3 mg/dL (ref 8.4–10.5)
Chloride: 105 mEq/L (ref 96–112)
Glucose, Bld: 174 mg/dL — ABNORMAL HIGH (ref 70–99)
Potassium: 4.3 mEq/L (ref 3.5–5.3)
Total Protein: 7.3 g/dL (ref 6.0–8.3)

## 2011-03-16 ENCOUNTER — Encounter (HOSPITAL_BASED_OUTPATIENT_CLINIC_OR_DEPARTMENT_OTHER): Payer: Self-pay | Admitting: Oncology

## 2011-03-16 DIAGNOSIS — C50919 Malignant neoplasm of unspecified site of unspecified female breast: Secondary | ICD-10-CM

## 2011-03-16 DIAGNOSIS — Z170 Estrogen receptor positive status [ER+]: Secondary | ICD-10-CM

## 2011-03-22 ENCOUNTER — Other Ambulatory Visit: Payer: Self-pay | Admitting: Physician Assistant

## 2011-03-22 ENCOUNTER — Encounter (HOSPITAL_BASED_OUTPATIENT_CLINIC_OR_DEPARTMENT_OTHER): Payer: Self-pay | Admitting: Oncology

## 2011-03-22 DIAGNOSIS — R000 Tachycardia, unspecified: Secondary | ICD-10-CM

## 2011-03-22 DIAGNOSIS — G4700 Insomnia, unspecified: Secondary | ICD-10-CM

## 2011-03-22 DIAGNOSIS — Z170 Estrogen receptor positive status [ER+]: Secondary | ICD-10-CM

## 2011-03-22 DIAGNOSIS — Z5111 Encounter for antineoplastic chemotherapy: Secondary | ICD-10-CM

## 2011-03-22 DIAGNOSIS — C50919 Malignant neoplasm of unspecified site of unspecified female breast: Secondary | ICD-10-CM

## 2011-03-22 LAB — CBC WITH DIFFERENTIAL/PLATELET
Basophils Absolute: 0.1 10*3/uL (ref 0.0–0.1)
Eosinophils Absolute: 0 10*3/uL (ref 0.0–0.5)
HGB: 10 g/dL — ABNORMAL LOW (ref 11.6–15.9)
MONO#: 0.4 10*3/uL (ref 0.1–0.9)
NEUT#: 4.2 10*3/uL (ref 1.5–6.5)
RBC: 3.42 10*6/uL — ABNORMAL LOW (ref 3.70–5.45)
RDW: 19.1 % — ABNORMAL HIGH (ref 11.2–14.5)
WBC: 5.6 10*3/uL (ref 3.9–10.3)
lymph#: 0.9 10*3/uL (ref 0.9–3.3)

## 2011-03-29 ENCOUNTER — Other Ambulatory Visit: Payer: Self-pay | Admitting: Physician Assistant

## 2011-03-29 ENCOUNTER — Encounter (HOSPITAL_BASED_OUTPATIENT_CLINIC_OR_DEPARTMENT_OTHER): Payer: Self-pay | Admitting: Oncology

## 2011-03-29 DIAGNOSIS — Z5111 Encounter for antineoplastic chemotherapy: Secondary | ICD-10-CM

## 2011-03-29 DIAGNOSIS — C50919 Malignant neoplasm of unspecified site of unspecified female breast: Secondary | ICD-10-CM

## 2011-03-29 DIAGNOSIS — Z170 Estrogen receptor positive status [ER+]: Secondary | ICD-10-CM

## 2011-03-29 LAB — COMPREHENSIVE METABOLIC PANEL
ALT: 18 U/L (ref 0–35)
CO2: 18 mEq/L — ABNORMAL LOW (ref 19–32)
Calcium: 9.3 mg/dL (ref 8.4–10.5)
Chloride: 107 mEq/L (ref 96–112)
Creatinine, Ser: 0.65 mg/dL (ref 0.40–1.20)
Glucose, Bld: 119 mg/dL — ABNORMAL HIGH (ref 70–99)
Sodium: 139 mEq/L (ref 135–145)
Total Bilirubin: 0.4 mg/dL (ref 0.3–1.2)
Total Protein: 6.3 g/dL (ref 6.0–8.3)

## 2011-03-29 LAB — CBC WITH DIFFERENTIAL/PLATELET
Basophils Absolute: 0 10*3/uL (ref 0.0–0.1)
Eosinophils Absolute: 0 10*3/uL (ref 0.0–0.5)
HGB: 9.8 g/dL — ABNORMAL LOW (ref 11.6–15.9)
LYMPH%: 5.4 % — ABNORMAL LOW (ref 14.0–49.7)
MCV: 88.5 fL (ref 79.5–101.0)
MONO#: 0.7 10*3/uL (ref 0.1–0.9)
MONO%: 5.2 % (ref 0.0–14.0)
NEUT#: 12.4 10*3/uL — ABNORMAL HIGH (ref 1.5–6.5)
Platelets: 144 10*3/uL — ABNORMAL LOW (ref 145–400)
RBC: 3.49 10*6/uL — ABNORMAL LOW (ref 3.70–5.45)
WBC: 13.9 10*3/uL — ABNORMAL HIGH (ref 3.9–10.3)
nRBC: 0 % (ref 0–0)

## 2011-03-30 ENCOUNTER — Encounter (HOSPITAL_BASED_OUTPATIENT_CLINIC_OR_DEPARTMENT_OTHER): Payer: Self-pay | Admitting: Oncology

## 2011-03-30 DIAGNOSIS — C50919 Malignant neoplasm of unspecified site of unspecified female breast: Secondary | ICD-10-CM

## 2011-03-30 DIAGNOSIS — Z170 Estrogen receptor positive status [ER+]: Secondary | ICD-10-CM

## 2011-04-03 ENCOUNTER — Encounter (INDEPENDENT_AMBULATORY_CARE_PROVIDER_SITE_OTHER): Payer: Self-pay | Admitting: Surgery

## 2011-04-05 ENCOUNTER — Encounter (HOSPITAL_BASED_OUTPATIENT_CLINIC_OR_DEPARTMENT_OTHER): Payer: Self-pay | Admitting: Oncology

## 2011-04-05 ENCOUNTER — Other Ambulatory Visit: Payer: Self-pay | Admitting: Physician Assistant

## 2011-04-05 DIAGNOSIS — Z1501 Genetic susceptibility to malignant neoplasm of breast: Secondary | ICD-10-CM

## 2011-04-05 DIAGNOSIS — Z170 Estrogen receptor positive status [ER+]: Secondary | ICD-10-CM

## 2011-04-05 DIAGNOSIS — C773 Secondary and unspecified malignant neoplasm of axilla and upper limb lymph nodes: Secondary | ICD-10-CM

## 2011-04-05 DIAGNOSIS — C50919 Malignant neoplasm of unspecified site of unspecified female breast: Secondary | ICD-10-CM

## 2011-04-05 DIAGNOSIS — Z5111 Encounter for antineoplastic chemotherapy: Secondary | ICD-10-CM

## 2011-04-05 LAB — CBC WITH DIFFERENTIAL/PLATELET
EOS%: 0.5 % (ref 0.0–7.0)
Eosinophils Absolute: 0 10*3/uL (ref 0.0–0.5)
MCHC: 34.1 g/dL (ref 31.5–36.0)
MONO#: 0.7 10*3/uL (ref 0.1–0.9)
MONO%: 8.9 % (ref 0.0–14.0)
NEUT#: 5.5 10*3/uL (ref 1.5–6.5)
RBC: 3.28 10*6/uL — ABNORMAL LOW (ref 3.70–5.45)
RDW: 18.7 % — ABNORMAL HIGH (ref 11.2–14.5)
WBC: 7.4 10*3/uL (ref 3.9–10.3)
lymph#: 1.1 10*3/uL (ref 0.9–3.3)

## 2011-04-06 ENCOUNTER — Other Ambulatory Visit: Payer: Self-pay | Admitting: Oncology

## 2011-04-06 DIAGNOSIS — C50919 Malignant neoplasm of unspecified site of unspecified female breast: Secondary | ICD-10-CM

## 2011-04-12 ENCOUNTER — Other Ambulatory Visit: Payer: Self-pay | Admitting: Physician Assistant

## 2011-04-12 ENCOUNTER — Encounter (HOSPITAL_BASED_OUTPATIENT_CLINIC_OR_DEPARTMENT_OTHER): Payer: Self-pay | Admitting: Oncology

## 2011-04-12 DIAGNOSIS — Z5111 Encounter for antineoplastic chemotherapy: Secondary | ICD-10-CM

## 2011-04-12 DIAGNOSIS — Z170 Estrogen receptor positive status [ER+]: Secondary | ICD-10-CM

## 2011-04-12 DIAGNOSIS — C50919 Malignant neoplasm of unspecified site of unspecified female breast: Secondary | ICD-10-CM

## 2011-04-12 LAB — CBC WITH DIFFERENTIAL/PLATELET
EOS%: 0 % (ref 0.0–7.0)
LYMPH%: 5.5 % — ABNORMAL LOW (ref 14.0–49.7)
MCH: 28.7 pg (ref 25.1–34.0)
MCV: 88.2 fL (ref 79.5–101.0)
MONO%: 2.7 % (ref 0.0–14.0)
RBC: 3.73 10*6/uL (ref 3.70–5.45)
RDW: 17.1 % — ABNORMAL HIGH (ref 11.2–14.5)

## 2011-04-12 LAB — COMPREHENSIVE METABOLIC PANEL
AST: 14 U/L (ref 0–37)
Albumin: 4.6 g/dL (ref 3.5–5.2)
Alkaline Phosphatase: 62 U/L (ref 39–117)
BUN: 10 mg/dL (ref 6–23)
Potassium: 4.5 mEq/L (ref 3.5–5.3)
Sodium: 137 mEq/L (ref 135–145)
Total Bilirubin: 0.4 mg/dL (ref 0.3–1.2)
Total Protein: 6.8 g/dL (ref 6.0–8.3)

## 2011-04-13 ENCOUNTER — Encounter: Payer: Self-pay | Admitting: Oncology

## 2011-04-18 ENCOUNTER — Encounter (HOSPITAL_BASED_OUTPATIENT_CLINIC_OR_DEPARTMENT_OTHER): Payer: Self-pay | Admitting: Oncology

## 2011-04-18 ENCOUNTER — Other Ambulatory Visit: Payer: Self-pay | Admitting: Physician Assistant

## 2011-04-18 ENCOUNTER — Ambulatory Visit (HOSPITAL_COMMUNITY)
Admission: RE | Admit: 2011-04-18 | Discharge: 2011-04-18 | Disposition: A | Discharge status: Home / Self Care | Payer: Self-pay | Source: Ambulatory Visit | Attending: Oncology | Admitting: Oncology

## 2011-04-18 ENCOUNTER — Other Ambulatory Visit: Payer: Self-pay | Admitting: Oncology

## 2011-04-18 DIAGNOSIS — C50919 Malignant neoplasm of unspecified site of unspecified female breast: Secondary | ICD-10-CM | POA: Insufficient documentation

## 2011-04-18 DIAGNOSIS — Z853 Personal history of malignant neoplasm of breast: Secondary | ICD-10-CM

## 2011-04-18 DIAGNOSIS — Z5111 Encounter for antineoplastic chemotherapy: Secondary | ICD-10-CM

## 2011-04-18 DIAGNOSIS — Z170 Estrogen receptor positive status [ER+]: Secondary | ICD-10-CM

## 2011-04-18 DIAGNOSIS — Z9221 Personal history of antineoplastic chemotherapy: Secondary | ICD-10-CM | POA: Insufficient documentation

## 2011-04-18 LAB — CBC WITH DIFFERENTIAL/PLATELET
Basophils Absolute: 0.1 10*3/uL (ref 0.0–0.1)
EOS%: 0.2 % (ref 0.0–7.0)
Eosinophils Absolute: 0 10*3/uL (ref 0.0–0.5)
HGB: 10.4 g/dL — ABNORMAL LOW (ref 11.6–15.9)
LYMPH%: 16.2 % (ref 14.0–49.7)
MCH: 29.7 pg (ref 25.1–34.0)
MCV: 88.1 fL (ref 79.5–101.0)
MONO%: 4.7 % (ref 0.0–14.0)
Platelets: 163 10*3/uL (ref 145–400)
RDW: 18.4 % — ABNORMAL HIGH (ref 11.2–14.5)

## 2011-04-18 MED ORDER — GADOBENATE DIMEGLUMINE 529 MG/ML IV SOLN
20.0000 mL | Freq: Once | INTRAVENOUS | Status: AC | PRN
  Administered 2011-04-18: 20 mL via INTRAVENOUS

## 2011-05-03 ENCOUNTER — Ambulatory Visit
Admission: RE | Admit: 2011-05-03 | Discharge: 2011-05-03 | Disposition: A | Discharge status: Home / Self Care | Payer: Self-pay | Source: Ambulatory Visit | Attending: Radiation Oncology | Admitting: Radiation Oncology

## 2011-05-03 DIAGNOSIS — Z510 Encounter for antineoplastic radiation therapy: Secondary | ICD-10-CM | POA: Insufficient documentation

## 2011-05-03 DIAGNOSIS — C50919 Malignant neoplasm of unspecified site of unspecified female breast: Secondary | ICD-10-CM | POA: Insufficient documentation

## 2011-05-10 ENCOUNTER — Encounter (HOSPITAL_COMMUNITY)
Admission: RE | Admit: 2011-05-10 | Discharge: 2011-05-10 | Disposition: A | Discharge status: Home / Self Care | Payer: Self-pay | Source: Ambulatory Visit | Attending: Surgery | Admitting: Surgery

## 2011-05-10 LAB — BASIC METABOLIC PANEL
Calcium: 9.4 mg/dL (ref 8.4–10.5)
GFR calc Af Amer: >60 mL/min (ref >60)
GFR calc non Af Amer: >60 mL/min (ref >60)
Glucose, Bld: 96 mg/dL (ref 70–99)
Potassium: 4.1 mEq/L (ref 3.5–5.1)
Sodium: 138 mEq/L (ref 135–145)

## 2011-05-10 LAB — DIFFERENTIAL
Basophils Absolute: 0 10*3/uL (ref 0.0–0.1)
Basophils Relative: 1 % (ref 0–1)
Eosinophils Absolute: 0.1 10*3/uL (ref 0.0–0.7)
Eosinophils Relative: 2 % (ref 0–5)
Monocytes Absolute: 0.4 10*3/uL (ref 0.1–1.0)
Monocytes Relative: 12 % (ref 3–12)
Neutro Abs: 2.3 10*3/uL (ref 1.7–7.7)

## 2011-05-10 LAB — CBC
Hemoglobin: 10.7 g/dL — ABNORMAL LOW (ref 12.0–15.0)
MCH: 28.8 pg (ref 26.0–34.0)
MCHC: 32.9 g/dL (ref 30.0–36.0)
Platelets: 183 10*3/uL (ref 150–400)
RDW: 16.2 % — ABNORMAL HIGH (ref 11.5–15.5)

## 2011-05-10 LAB — SURGICAL PCR SCREEN
MRSA, PCR: NEGATIVE
Staphylococcus aureus: NEGATIVE

## 2011-05-12 ENCOUNTER — Other Ambulatory Visit (INDEPENDENT_AMBULATORY_CARE_PROVIDER_SITE_OTHER): Payer: Self-pay | Admitting: Surgery

## 2011-05-12 ENCOUNTER — Ambulatory Visit (HOSPITAL_COMMUNITY)
Admission: RE | Admit: 2011-05-12 | Discharge: 2011-05-14 | Disposition: A | Discharge status: Home / Self Care | Payer: Self-pay | Source: Ambulatory Visit | Attending: Surgery | Admitting: Surgery

## 2011-05-12 DIAGNOSIS — E669 Obesity, unspecified: Secondary | ICD-10-CM | POA: Insufficient documentation

## 2011-05-12 DIAGNOSIS — G4733 Obstructive sleep apnea (adult) (pediatric): Secondary | ICD-10-CM | POA: Insufficient documentation

## 2011-05-12 DIAGNOSIS — C50919 Malignant neoplasm of unspecified site of unspecified female breast: Secondary | ICD-10-CM | POA: Insufficient documentation

## 2011-05-12 DIAGNOSIS — Z1501 Genetic susceptibility to malignant neoplasm of breast: Secondary | ICD-10-CM

## 2011-05-12 DIAGNOSIS — K219 Gastro-esophageal reflux disease without esophagitis: Secondary | ICD-10-CM | POA: Insufficient documentation

## 2011-05-12 DIAGNOSIS — C773 Secondary and unspecified malignant neoplasm of axilla and upper limb lymph nodes: Secondary | ICD-10-CM | POA: Insufficient documentation

## 2011-05-12 DIAGNOSIS — D249 Benign neoplasm of unspecified breast: Secondary | ICD-10-CM

## 2011-05-12 HISTORY — PX: MASTECTOMY: SHX3

## 2011-05-13 NOTE — Op Note (Signed)
NAMENIREL, BABLER                   ACCOUNT NO.:  [id number]  MEDICAL RECORD NO.:  [id number]  LOCATION:  5123                         FACILITY:  [facility]  PHYSICIAN:  Currie Paris, M.D.DATE OF BIRTH:  02-21-1961  DATE OF PROCEDURE:  05/12/2011 DATE OF DISCHARGE:                              OPERATIVE REPORT   PREOPERATIVE DIAGNOSIS:  Left breast cancer (inflammatory), status post neoadjuvant chemotherapy.  Breast cancer antigen positive.  POSTOPERATIVE DIAGNOSIS:  Left breast cancer (inflammatory), status post neoadjuvant chemotherapy.  Breast cancer antigen positive.  PROCEDURE:  Right total mastectomy, left modified radical mastectomy.  SURGEON:  Currie Paris, MD  ANESTHESIA:  General.  ASSISTANT:  Angelia Mould. Derrell Lolling, MD  CLINICAL HISTORY:  This is a 50 year old lady who presented several months ago with a fairly large left breast mass with diagnosis of cancer and axillary metastases.  She is BRCA positive.  She elected to have a bilateral mastectomy with plans for subsequent oophorectomy.  Because of her disease in the left axilla, we planned a left axillary dissection, so her left side would be essentially a left modified mastectomy.  DESCRIPTION OF PROCEDURE:  I saw the patient in the holding area and confirmed the plans for surgery as noted above.  I initialed left axillary area as the site for the axillary dissection.  The patient was taken to the operating room.  After satisfactory general anesthesia had been obtained, both breasts were prepped and draped, and the time-out was done.  I started on the right side.  I outlined a generous elliptical incision. I then raised the usual skin flaps to sternum, clavicle, inframammary fold, and latissimus.  The breast was removed from medial to lateral taking the fascia and using cautery.  I got to the edge of the clavipectoral fascia and then tried to stay out of the axilla and was taken off the fatty tissue  between the chest wall and the skin.  Once this was all done, I irrigated and spent several minutes making sure everything was dry.  I then put a 19 Blake drain in and secured it with 2-0 nylon.  We irrigated another time and everything appeared to be dry, so I closed with interrupted 3-0 Vicryl and a running 4-0 Monocryl, subcuticular plus Dermabond.  Attention was turned to the left side.  I began the mastectomy identically and raised the same skin flaps.  However, here once I got to the edge of the pectoralis, I opened the clavipectoral fascia.  I identified the axillary vein, dissected the axillary contents out from medial to lateral and superior to inferior.  I stayed below the vein.  I saw and preserved the long thoracodorsal nerves.  Once I had the axillary contents swept out, I disconnected the final lateral attachments to the anterior edge of the latissimus.  Again, we irrigated put in two drains here, irrigated and then closed.  The patient tolerated the procedure well.  There were no operative complications.  All counts were correct.  Estimated blood loss was 100 mL.     Currie Paris, M.D.     CJS/MEDQ  D:  05/12/2011  T:  05/13/2011  Job:  [id number]  cc:   Pierce Crane, M.D., F.R.C.P.C.  Electronically Signed by Cyndia Bent M.D. on 05/13/2011 11:40:18 AM

## 2011-05-15 ENCOUNTER — Encounter (INDEPENDENT_AMBULATORY_CARE_PROVIDER_SITE_OTHER): Payer: Self-pay | Admitting: Surgery

## 2011-05-18 ENCOUNTER — Encounter (INDEPENDENT_AMBULATORY_CARE_PROVIDER_SITE_OTHER): Payer: Self-pay | Admitting: Surgery

## 2011-05-18 ENCOUNTER — Ambulatory Visit (INDEPENDENT_AMBULATORY_CARE_PROVIDER_SITE_OTHER): Payer: Self-pay | Admitting: Surgery

## 2011-05-18 VITALS — BP 128/86 | HR 84 | Temp 97.8°F | Ht 63.0 in | Wt 231.4 lb

## 2011-05-18 DIAGNOSIS — C50512 Malignant neoplasm of lower-outer quadrant of left female breast: Secondary | ICD-10-CM | POA: Insufficient documentation

## 2011-05-18 DIAGNOSIS — C50919 Malignant neoplasm of unspecified site of unspecified female breast: Secondary | ICD-10-CM

## 2011-05-18 MED ORDER — HYDROMORPHONE HCL 2 MG PO TABS: 2.0000 mg | ORAL_TABLET | ORAL | Status: DC | PRN

## 2011-05-18 NOTE — Progress Notes (Signed)
CC: A bilateral mastectomy with left axillary node dissection  HPI: This patient comes in for post op follow-up. She underwent right total mastectomy and left modified radical mastectomy on 05/12/11. She feels that she is doing well.  PE: General: The patient appears to be healthy, NAD  Both incisions are healing nicely. There is no evidence of infection. All drains are draining serous material. There still draining more than 50 cc per day each.  IMPRESSION: The patient is doing well S/P bilateral mastectomies.  DATA REVIWED: Pathology report was reviewed. The right side showed some fibroadenomas. 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 I reviewed that with the patient  PLAN: We will see her next week to see if we can get some drains out.

## 2011-05-18 NOTE — Patient Instructions (Signed)
I will need to see you to next week to take a drain or two out.

## 2011-05-22 ENCOUNTER — Encounter (INDEPENDENT_AMBULATORY_CARE_PROVIDER_SITE_OTHER): Payer: Self-pay | Admitting: Surgery

## 2011-05-22 ENCOUNTER — Ambulatory Visit (INDEPENDENT_AMBULATORY_CARE_PROVIDER_SITE_OTHER): Payer: Self-pay | Admitting: Surgery

## 2011-05-22 DIAGNOSIS — C50919 Malignant neoplasm of unspecified site of unspecified female breast: Secondary | ICD-10-CM

## 2011-05-22 NOTE — Patient Instructions (Addendum)
Call the office when at least one of your drains slows down to less than 30 cc in 24 hours. We will have you come in to begin having drains removed.  I will need to see you in about three weeks.  You may shower and take the streri-strips off as they come loose. Keep a sterile gauze dressing on each drain and use antibiotic ointment when you put new dressing on.

## 2011-05-22 NOTE — Progress Notes (Signed)
CC: Bilateral mastectomy with left axillary dissection  HPI: This patient comes in for post op follow-up. She underwent bilateral mastectomy with left axillary dissection on about June 30. []  feels that she is doing well.  PE: General: The patient appears to be healthy, NAD  Was mastectomy incisions are healing nicely. The Steri-Strips are starting to come loose. The drains still have clear fluid. They have not slowed up enough to remove any of them.  IMPRESSION: The patient is doing well S/P bilateral mastoid.  DATA REVIWED: Noted  PLAN: She will monitor the drain used. She will call our office when at least one of the drains gets less than 30 cc over a 24-hour period so we can begin to take them out. She knows that I will be out of town the next couple of weeks and that one of the other physicians here we'll be able to take care of her.

## 2011-05-24 ENCOUNTER — Ambulatory Visit (INDEPENDENT_AMBULATORY_CARE_PROVIDER_SITE_OTHER): Payer: Self-pay | Admitting: Surgery

## 2011-05-24 VITALS — HR 68 | Temp 96.8°F

## 2011-05-24 DIAGNOSIS — Z9889 Other specified postprocedural states: Secondary | ICD-10-CM

## 2011-05-24 NOTE — Progress Notes (Signed)
CC: Drain slowing down  HPI: This patient comes in for post op follow-up. She underwent bilateral mastectomy on June 29. She feels that she is doing well. Her drain #2 has slowed down. She thought it was ready to be taken out  PE: General: The patient appears to be healthy, NAD  Both mastectomy incisions are healing nicely. All of the drains are producing serous material. The left axillary drain has slowed down to about 10-15 cc per day.  IMPRESSION: The patient is doing well S/P bilateral mastectomy. I think the left lateral drain can be taken out today.Marland Kitchen  DATA REVIWED: No need to  PLAN: The lateral drain is removed easily. Sterile dressings applied. She is to follow up here when the other drains slowed down.

## 2011-05-26 ENCOUNTER — Telehealth (INDEPENDENT_AMBULATORY_CARE_PROVIDER_SITE_OTHER): Payer: Self-pay

## 2011-05-26 NOTE — Telephone Encounter (Signed)
Pt called wanting to know if she can just try Ultram she was given by Dr Jamey Ripa . Pt was taking it with Dilaudid per his instruction. Pt wants to just try Ultram and wait on taking other pain meds unless needed.Pt will d/c dilaudid at this time and pt will call if other pain meds are needed.

## 2011-05-30 ENCOUNTER — Other Ambulatory Visit: Payer: Self-pay

## 2011-06-02 ENCOUNTER — Ambulatory Visit (INDEPENDENT_AMBULATORY_CARE_PROVIDER_SITE_OTHER): Payer: Self-pay | Admitting: General Surgery

## 2011-06-02 VITALS — Temp 96.8°F

## 2011-06-02 DIAGNOSIS — Z4889 Encounter for other specified surgical aftercare: Secondary | ICD-10-CM

## 2011-06-02 DIAGNOSIS — Z5189 Encounter for other specified aftercare: Secondary | ICD-10-CM

## 2011-06-02 MED ORDER — HYDROCODONE-ACETAMINOPHEN 10-325 MG PO TABS: 1.0000 | ORAL_TABLET | Freq: Four times a day (QID) | ORAL | Status: DC | PRN

## 2011-06-02 MED ORDER — CELECOXIB 200 MG PO CAPS: 200.0000 mg | ORAL_CAPSULE | Freq: Every day | ORAL | Status: DC

## 2011-06-02 NOTE — Progress Notes (Signed)
Tina Vaughan is a 50 y.o. female.    Chief Complaint  Patient presents with  . Other    PO br drains    HPI HPI She follows up today for evaluation of bilateral mastectomies with left axillary dissection. She still has 2 Drains in place under each breast flap. Her right mastectomy drain has decreased output to less than 30 cc per day for the last 3 days and she desires removal. The left mastectomy drain is still putting out 50-60 cc per day. She denies any fevers or chills and is doing okay other than left axillary pain which she describes as "shooting" pain to her left arm. She's been taking Ultram for relief which she states helps and would like her usual that today. She denies any swelling in her arms.  Past Medical History  Diagnosis Date  . Frequent urination   . Depression   . Sleep apnea   . Eczema     Past Surgical History  Procedure Date  . Breast surgery     Family History  Problem Relation Age of Onset  . Cancer Mother   . Stroke Father   . Hypertension Father     Social History History  Substance Use Topics  . Smoking status: Never Smoker   . Smokeless tobacco: Not on file  . Alcohol Use: Yes     social    Allergies  Allergen Reactions  . Doxycycline Hydrochloride Nausea Only    Current Outpatient Prescriptions  Medication Sig Dispense Refill  . Citalopram Hydrobromide (CELEXA PO) Take by mouth.        Marland Kitchen econazole nitrate 1 % cream Apply topically daily.        . Ibuprofen (ADVIL PO) Take by mouth as needed.        Marland Kitchen LORazepam (ATIVAN PO) Take by mouth.        . Omeprazole Magnesium (PRILOSEC OTC PO) Take by mouth.        . Tolterodine Tartrate (DETROL PO) Take by mouth.        . TraMADol HCl (ULTRAM PO) Take by mouth every 8 (eight) hours as needed.        . celecoxib (CELEBREX) 200 MG capsule Take 1 capsule (200 mg total) by mouth daily.  30 capsule  2  . HYDROcodone-acetaminophen (NORCO) 10-325 MG per tablet Take 1 tablet by mouth every 6 (six)  hours as needed for pain.  40 tablet  0  . HYDROmorphone (DILAUDID) 2 MG tablet Take 1 tablet (2 mg total) by mouth every 4 (four) hours as needed for pain.  30 tablet  0    Review of Systems ROS  Physical Exam Physical Exam  She is in no acute distress and nontoxic-appearing  Her incisions are healing well without sign of infection. Her right JP drain #1 was removed today without difficulty. Her left JP drain has serous output with somewhat thicker material in it which is likely fat necrosis but sign of infection. The JP site looks fine. She does have some limit in her range of motion in both arms left greater than right which seems to be due to discomfort. Temperature 96.8 F (36 C).  Assessment/Plan Status post bilateral mastectomies for inflammatory breast cancer. Her incisions are healing well and her right JP was removed today without complication. A recommended that she leave JP #3 in place until the output decreases. I also refilled some pain medication for her but instead of the Ultram which interacts  with her Celexa I prescribed some hydrocodone. I also added some Celebrex to take daily which will hopefully replace the advil she is taking routinely for added relief. She will continue current drain care and followup in a week or 2 but her drain output decreases.  Lodema Pilot DAVID 06/02/2011, 10:52 AM

## 2011-06-12 ENCOUNTER — Telehealth (INDEPENDENT_AMBULATORY_CARE_PROVIDER_SITE_OTHER): Payer: Self-pay

## 2011-06-12 NOTE — Telephone Encounter (Signed)
Can you please call [facility] about a follow up appointment. Dr Tenna Child schedule is full but she will need an appointment for possible drain check/removal. Dr Biagio Quint saw her on 7/20 and notes said she needed 2 to be seen in 2 weeks. Please call pt regarding an appointment.

## 2011-06-20 ENCOUNTER — Encounter (INDEPENDENT_AMBULATORY_CARE_PROVIDER_SITE_OTHER): Payer: Self-pay | Admitting: Surgery

## 2011-06-20 ENCOUNTER — Ambulatory Visit (INDEPENDENT_AMBULATORY_CARE_PROVIDER_SITE_OTHER): Payer: Self-pay | Admitting: Surgery

## 2011-06-20 VITALS — Temp 97.0°F

## 2011-06-20 DIAGNOSIS — Z9889 Other specified postprocedural states: Secondary | ICD-10-CM

## 2011-06-20 DIAGNOSIS — C50919 Malignant neoplasm of unspecified site of unspecified female breast: Secondary | ICD-10-CM

## 2011-06-20 NOTE — Progress Notes (Signed)
This patient comes back for drain management. She is about five weeks postop bilateral mastectomies. She still has a left axillary drain in place. It is slow down to 20 or 30 cc per day over the last several days.  Exam: Both incisions are healing nicely. There is extra tissue bilaterally but I told her I thought this would resolve. The drain is draining clear material but is slow down so I removed that day.  Impression: Status post bilateral mastectomies with drain removal today plan: I will see her back in about a week for followup. We did give her ABC information.

## 2011-06-20 NOTE — Patient Instructions (Signed)
You can have normal activities. Please go to the ABC class.  You may get some fluid with the drain out so we will check for that next week

## 2011-06-27 ENCOUNTER — Encounter (INDEPENDENT_AMBULATORY_CARE_PROVIDER_SITE_OTHER): Payer: Self-pay | Admitting: Surgery

## 2011-06-27 ENCOUNTER — Ambulatory Visit (INDEPENDENT_AMBULATORY_CARE_PROVIDER_SITE_OTHER): Payer: Self-pay | Admitting: Surgery

## 2011-06-27 VITALS — BP 120/80 | HR 80 | Temp 98.1°F

## 2011-06-27 DIAGNOSIS — Z9889 Other specified postprocedural states: Secondary | ICD-10-CM

## 2011-06-27 NOTE — Patient Instructions (Signed)
Work more on the exercises on your shoulder

## 2011-06-27 NOTE — Progress Notes (Signed)
History of present illness: Patient comes in for her followup visit. She had her drain from the left axilla removed on her last visit. She feels like she is doing well. She doesn't think she has accumulated any fluid since the drain was removed. She has been working on her shoulder exercises.  Exam: Iincisions bilaterally are healing nicely. There is a fair amount of extraneous fatty tissue in the left axilla but I think this will resolve. There is no evidence of infection. She has improved range of motion of the left shoulder but it is still limited and she is developing a little bit of chordee.   Impression stable exam getting ready to start radiation.  Plan: I will see her back for a followup in about two months. I urged her to continue working on her exercises and followup at the [facility] class.

## 2011-08-01 ENCOUNTER — Ambulatory Visit
Admission: RE | Admit: 2011-08-01 | Discharge: 2011-08-01 | Disposition: A | Discharge status: Home / Self Care | Payer: Self-pay | Source: Ambulatory Visit | Attending: Radiation Oncology | Admitting: Radiation Oncology

## 2011-08-01 DIAGNOSIS — C50919 Malignant neoplasm of unspecified site of unspecified female breast: Secondary | ICD-10-CM | POA: Insufficient documentation

## 2011-08-01 DIAGNOSIS — Y842 Radiological procedure and radiotherapy as the cause of abnormal reaction of the patient, or of later complication, without mention of misadventure at the time of the procedure: Secondary | ICD-10-CM | POA: Insufficient documentation

## 2011-08-01 DIAGNOSIS — Z510 Encounter for antineoplastic radiation therapy: Secondary | ICD-10-CM | POA: Insufficient documentation

## 2011-08-01 DIAGNOSIS — G4700 Insomnia, unspecified: Secondary | ICD-10-CM | POA: Insufficient documentation

## 2011-08-01 DIAGNOSIS — L988 Other specified disorders of the skin and subcutaneous tissue: Secondary | ICD-10-CM | POA: Insufficient documentation

## 2011-08-08 LAB — CBC WITH DIFFERENTIAL/PLATELET
BASO%: 0.4 % (ref 0.0–2.0)
EOS%: 2.3 % (ref 0.0–7.0)
LYMPH%: 14.8 % (ref 14.0–49.7)
MCHC: 35 g/dL (ref 31.5–36.0)
MCV: 79.2 fL — ABNORMAL LOW (ref 79.5–101.0)
MONO%: 8.2 % (ref 0.0–14.0)
Platelets: 200 10*3/uL (ref 145–400)
RBC: 4.55 10*6/uL (ref 3.70–5.45)
RDW: 17.9 % — ABNORMAL HIGH (ref 11.2–14.5)
WBC: 3.9 10*3/uL (ref 3.9–10.3)

## 2011-08-08 LAB — COMPREHENSIVE METABOLIC PANEL
ALT: 15 U/L (ref 0–35)
AST: 11 U/L (ref 0–37)
Alkaline Phosphatase: 76 U/L (ref 39–117)
Potassium: 3.5 mEq/L (ref 3.5–5.3)
Sodium: 140 mEq/L (ref 135–145)
Total Bilirubin: 0.2 mg/dL — ABNORMAL LOW (ref 0.3–1.2)
Total Protein: 6.9 g/dL (ref 6.0–8.3)

## 2011-09-07 ENCOUNTER — Ambulatory Visit
Admission: RE | Admit: 2011-09-07 | Discharge: 2011-09-07 | Disposition: A | Discharge status: Home / Self Care | Payer: Self-pay | Source: Ambulatory Visit | Attending: Radiation Oncology | Admitting: Radiation Oncology

## 2011-09-14 ENCOUNTER — Encounter (INDEPENDENT_AMBULATORY_CARE_PROVIDER_SITE_OTHER): Payer: Self-pay | Admitting: Surgery

## 2011-09-14 ENCOUNTER — Ambulatory Visit (INDEPENDENT_AMBULATORY_CARE_PROVIDER_SITE_OTHER): Payer: Self-pay | Admitting: Surgery

## 2011-09-14 VITALS — BP 122/86 | HR 72 | Temp 97.6°F | Resp 14 | Ht 62.5 in | Wt 232.8 lb

## 2011-09-14 DIAGNOSIS — C50919 Malignant neoplasm of unspecified site of unspecified female breast: Secondary | ICD-10-CM

## 2011-09-14 NOTE — Progress Notes (Signed)
NAME: [name]       DOB: [birth year]           DATE: 09/14/2011       MRN: [id number]   Tina Vaughan is a 50 y.o.Marland Kitchenfemale who presents for routine followup of her Left breast cancer diagnosed in Jan 2012 and treated with neoadjuvant chemo, mastectomy and radiation. She has no problems or concerns on either side.  PFSH: She has had no significant changes since the last visit here.Of note is her mother had breast cancer and the patient is BRCA +  ROS: There have been no significant changes since the last visit here  EXAM: General: The patient is alert, oriented, generally healty appearing, NAD. Mood and affect are normal.  Breasts:  She is status post bilateral mastectomies. The right side is fairly soft and has very little excess tissue. The left side had some recent radiation changes with increased pigmentation. It is always been noted some excess tissues laterally and this is fairly firm now from the radiation. There is no evidence of any local recurrence. She is still trying to decide about having a reconstruction.  Lymphatics: She has no axillary or supraclavicular adenopathy on either side.  Extremities: Full ROM of the surgical side with no lymphedema noted.  Data Reviewed: No new data  Impression: Doing well, with no evidence of recurrent cancer or new cancer  Plan: I will plan to see her back in six months. Since she is BRCA positive I suggested that she strongly consider having an oophorectomy. This should reduce her chance of a recurrent breast cancer plus reduce the chance of developing a primary ovarian cancer. She is still taking about what to do about that. She was originally planning for reconstructions but now is taking about deferring or not doing that also.

## 2011-09-14 NOTE — Patient Instructions (Signed)
I will see you again in about six months. I think you should strongly consider taking your ovaries out

## 2011-09-28 ENCOUNTER — Telehealth: Payer: Self-pay | Admitting: *Deleted

## 2011-09-28 MED ORDER — ZOLPIDEM TARTRATE 10 MG PO TABS: 10.0000 mg | ORAL_TABLET | Freq: Every evening | ORAL | Status: DC | PRN

## 2011-09-28 NOTE — Telephone Encounter (Signed)
Gave patient appointment for 10-18-2011 starting at 10:00am for labs and dr.rubin

## 2011-09-29 ENCOUNTER — Other Ambulatory Visit: Payer: Self-pay | Admitting: Oncology

## 2011-10-13 ENCOUNTER — Telehealth: Payer: Self-pay | Admitting: *Deleted

## 2011-10-13 NOTE — Telephone Encounter (Signed)
the patient of the new appointment asked patient to please call me back and let me know she did get the message

## 2011-10-16 ENCOUNTER — Telehealth: Payer: Self-pay | Admitting: *Deleted

## 2011-10-16 ENCOUNTER — Telehealth: Payer: Self-pay | Admitting: Oncology

## 2011-10-16 DIAGNOSIS — C50919 Malignant neoplasm of unspecified site of unspecified female breast: Secondary | ICD-10-CM

## 2011-10-16 MED ORDER — ZOLPIDEM TARTRATE 10 MG PO TABS: 10.0000 mg | ORAL_TABLET | Freq: Every evening | ORAL | Status: DC | PRN

## 2011-10-16 NOTE — Telephone Encounter (Addendum)
Pt. Called.  She was to see Dr. Donnie Coffin this week and her appt. Has been r/s to 11/23/11.  She c/o  Her right hand swelling and pain up to her elbow.  She does not see any difference early in the am or late at night and she is concerned that she may be starting with lymphedema..    Also Dr. Neldon Mc her Robbins for sleep and it helped.  She has finished radiation and Dr. Michell Heinrich will not refill this and wonders if Dr.Rubin would give her a refill of ambien 10mg ? Per Dr. Donnie Coffin:  Make referral to [facility]/ referral faxed.   OK to refill ambien #30 with one refill.  Done/[facility].  Pt. Aware of both of these.

## 2011-10-16 NOTE — Telephone Encounter (Signed)
Called Jarred @ [facility] [phone] concerning patients disability.  Rosselyn will not see Dr. Donnie Coffin until January and her surgery will possibly be scheduled in February or March, therefore she will be disabled another 4-6 months

## 2011-10-16 NOTE — Telephone Encounter (Signed)
patient called in confirmed appointment on 11-2011

## 2011-10-18 ENCOUNTER — Ambulatory Visit: Payer: Self-pay | Admitting: Oncology

## 2011-10-18 ENCOUNTER — Other Ambulatory Visit: Payer: Self-pay | Admitting: Lab

## 2011-10-23 ENCOUNTER — Ambulatory Visit: Payer: Self-pay | Attending: Oncology | Admitting: Physical Therapy

## 2011-10-23 DIAGNOSIS — I890 Lymphedema, not elsewhere classified: Secondary | ICD-10-CM | POA: Insufficient documentation

## 2011-10-23 DIAGNOSIS — IMO0001 Reserved for inherently not codable concepts without codable children: Secondary | ICD-10-CM | POA: Insufficient documentation

## 2011-10-25 ENCOUNTER — Ambulatory Visit: Payer: Self-pay

## 2011-10-25 ENCOUNTER — Encounter: Payer: Self-pay | Admitting: *Deleted

## 2011-10-25 ENCOUNTER — Telehealth: Payer: Self-pay | Admitting: *Deleted

## 2011-10-25 NOTE — Telephone Encounter (Signed)
patient confirmed new appointment on 10-26-2011 arrival at 2:00pm

## 2011-10-26 ENCOUNTER — Telehealth: Payer: Self-pay | Admitting: *Deleted

## 2011-10-26 ENCOUNTER — Ambulatory Visit (HOSPITAL_BASED_OUTPATIENT_CLINIC_OR_DEPARTMENT_OTHER): Payer: Self-pay | Admitting: Oncology

## 2011-10-26 VITALS — BP 122/86 | HR 108 | Temp 98.1°F | Ht 62.5 in | Wt 238.3 lb

## 2011-10-26 DIAGNOSIS — Z170 Estrogen receptor positive status [ER+]: Secondary | ICD-10-CM

## 2011-10-26 DIAGNOSIS — C50919 Malignant neoplasm of unspecified site of unspecified female breast: Secondary | ICD-10-CM

## 2011-10-26 MED ORDER — GABAPENTIN 300 MG PO CAPS: 300.0000 mg | ORAL_CAPSULE | Freq: Three times a day (TID) | ORAL | Status: DC

## 2011-10-26 NOTE — Progress Notes (Signed)
Hematology and Oncology Follow Up Visit  Tina Vaughan [id number] 1961-02-09 50 y.o. 10/26/2011 2:57 PM PCP  Principle Diagnosis: 50 yo female with hx of inflammatory rt breast cancer s/p FEC x 6 followed by taxotere x 4,  S/p MRM with ypT3N3 er+, breast cancer 05/12/11, s/p xrt completed 10/12.  Interim History:  There have been no intercurrent illness, hospitalizations or medication changes.  Medications: I have reviewed the patient's current medications.  Allergies:  Allergies  Allergen Reactions  . Doxycycline Hydrochloride Nausea Only    Past Medical History, Surgical history, Social history, and Family History were reviewed and updated.  Review of Systems: Constitutional:  Negative for fever, chills, night sweats, anorexia, weight loss, pain. Cardiovascular: no chest pain or dyspnea on exertion Respiratory: no cough, shortness of breath, or wheezing Neurological: no TIA or stroke symptoms Dermatological: negative ENT: negative Skin Gastrointestinal: no abdominal pain, change in bowel habits, or black or bloody stools Genito-Urinary: no dysuria, trouble voiding, or hematuria Hematological and Lymphatic: negative Breast: negative for breast lumps Musculoskeletal: negative Remaining ROS negative., difficulty sleeping  Physical Exam: Blood pressure 122/86, pulse 108, temperature 98.1 F (36.7 C), height 5' 2.5" (1.588 m), weight 238 lb 4.8 oz (108.092 kg). ECOG: 0 General appearance: alert, cooperative and appears stated age Head: Normocephalic, without obvious abnormality, atraumatic Neck: no adenopathy, no carotid bruit, no JVD, supple, symmetrical, trachea midline and thyroid not enlarged, symmetric, no tenderness/mass/nodules Lymph nodes: Cervical, supraclavicular, and axillary nodes normal. Cardiac : regular rate and rhythm Pulmonary:clear to auscultation bilaterally and normal percussion bilaterally Breasts:, s/p bilateral mastectomy, no evidence of local  recurrence Abdomen:soft, non-tender; bowel sounds normal; no masses,  no organomegaly Extremities negative, cyanosis, clubbing Neuro: alert, oriented, normal speech, no focal findings or movement disorder noted  Lab Results: Lab Results  Component Value Date   WBC 3.9 08/08/2011   HGB 12.6 08/08/2011   HCT 36.1 08/08/2011   MCV 79.2* 08/08/2011   PLT 200 08/08/2011     Chemistry      Component Value Date/Time   NA 140 08/08/2011 1544   K 3.5 08/08/2011 1544   CL 102 08/08/2011 1544   CO2 32 08/08/2011 1544   BUN 11 08/08/2011 1544   CREATININE 0.48* 08/08/2011 1544      Component Value Date/Time   CALCIUM 9.5 08/08/2011 1544   ALKPHOS 76 08/08/2011 1544   AST 11 08/08/2011 1544   ALT 15 08/08/2011 1544   BILITOT 0.2* 08/08/2011 1544       Radiological Studies: chest X-ray n/a Mammogram n/a Bone density n/a  Impression and Plan: Pleasant woman with large primary breast cancer now s/p neoadjuvant chemotherapy and surgery.  Hx of amenorrhea with significan vasomotor symptoms Hx of lt arm pain  We diuscussed going on with oophorectomy which would further educe her risks of breast cancer relapse and risk of ovarian cancer. I have recommended tamoxifen in the interim. She still has a port in place that has not been flushed. We will make those arrangements.  F/u in 3 months.  More than 50% of the visit was spent in patient-related counselling   Pierce Crane, MD 12/13/20122:57 PM

## 2011-10-26 NOTE — Telephone Encounter (Signed)
gave patient appointment for flush 10-2011 and three months with dr.rubin

## 2011-10-30 ENCOUNTER — Ambulatory Visit: Payer: Self-pay | Admitting: Physical Therapy

## 2011-10-31 ENCOUNTER — Telehealth: Payer: Self-pay | Admitting: Oncology

## 2011-10-31 ENCOUNTER — Ambulatory Visit (HOSPITAL_BASED_OUTPATIENT_CLINIC_OR_DEPARTMENT_OTHER): Payer: Self-pay

## 2011-10-31 VITALS — BP 128/90 | HR 90 | Temp 97.4°F

## 2011-10-31 DIAGNOSIS — Z452 Encounter for adjustment and management of vascular access device: Secondary | ICD-10-CM

## 2011-10-31 DIAGNOSIS — C50919 Malignant neoplasm of unspecified site of unspecified female breast: Secondary | ICD-10-CM

## 2011-10-31 MED ORDER — SODIUM CHLORIDE 0.9 % IJ SOLN
10.0000 mL | INTRAMUSCULAR | Status: DC | PRN
  Administered 2011-10-31: 10 mL via INTRAVENOUS
  Filled 2011-10-31: qty 10

## 2011-10-31 MED ORDER — HEPARIN SOD (PORK) LOCK FLUSH 100 UNIT/ML IV SOLN
500.0000 [IU] | Freq: Once | INTRAVENOUS | Status: AC
  Administered 2011-10-31: 500 [IU] via INTRAVENOUS
  Filled 2011-10-31: qty 5

## 2011-10-31 NOTE — Telephone Encounter (Signed)
Gv pt appt for [id number] 

## 2011-10-31 NOTE — Telephone Encounter (Signed)
Patient received one prescription from [facility] on 10/26/11 $44.15,her remaning balance ALIGHT $42.39

## 2011-11-01 ENCOUNTER — Ambulatory Visit: Payer: Self-pay

## 2011-11-03 ENCOUNTER — Other Ambulatory Visit: Payer: Self-pay | Admitting: *Deleted

## 2011-11-03 DIAGNOSIS — S20119A Abrasion of breast, unspecified breast, initial encounter: Secondary | ICD-10-CM

## 2011-11-03 MED ORDER — TAMOXIFEN CITRATE 20 MG PO TABS: 20.0000 mg | ORAL_TABLET | Freq: Every day | ORAL | Status: AC

## 2011-11-08 ENCOUNTER — Telehealth: Payer: Self-pay | Admitting: Oncology

## 2011-11-08 ENCOUNTER — Ambulatory Visit: Payer: Self-pay

## 2011-11-08 NOTE — Telephone Encounter (Signed)
Patient received one prescription from [facility] on 11/03/11 $28.31,her remaning balance ALIGHT $14.08.

## 2011-11-10 ENCOUNTER — Encounter: Payer: Self-pay | Admitting: *Deleted

## 2011-11-10 NOTE — Progress Notes (Unsigned)
Pt reports that since she started taking neurotin 300mg  Tid, she is experiencing "nightmares" Lack of sleep  '  pt was instructed to decrease  Dose to 300mg  bid and notify this desk if sx worsen or dont improve

## 2011-11-15 ENCOUNTER — Ambulatory Visit: Payer: Self-pay | Attending: Oncology

## 2011-11-15 DIAGNOSIS — I890 Lymphedema, not elsewhere classified: Secondary | ICD-10-CM | POA: Insufficient documentation

## 2011-11-15 DIAGNOSIS — IMO0001 Reserved for inherently not codable concepts without codable children: Secondary | ICD-10-CM | POA: Insufficient documentation

## 2011-11-17 ENCOUNTER — Telehealth: Payer: Self-pay | Admitting: *Deleted

## 2011-11-17 DIAGNOSIS — C50919 Malignant neoplasm of unspecified site of unspecified female breast: Secondary | ICD-10-CM

## 2011-11-17 MED ORDER — TRAZODONE HCL 50 MG PO TABS: 50.0000 mg | ORAL_TABLET | Freq: Every day | ORAL | Status: AC

## 2011-11-17 NOTE — Telephone Encounter (Signed)
Pt. Calls.  She has decreased the neurontin 300mg  to one tablet from 3.  She is still not sleeping well, although it has helped her hot flashed.  Discussed with Dr. Donnie Coffin.  Add trazadone  50mg   To help her sleep.  Will call to [facility].  Pt. Will try this and stay on the one neurontin.

## 2011-11-21 ENCOUNTER — Encounter: Payer: Self-pay | Admitting: Oncology

## 2011-11-21 NOTE — Progress Notes (Signed)
Patient received one prescription from [facility] on 11/17/11 $14.80,her remaning balance [facility] -0- and ALIGHT- 0-

## 2011-11-22 ENCOUNTER — Ambulatory Visit: Payer: Self-pay

## 2011-11-23 ENCOUNTER — Ambulatory Visit: Payer: Self-pay | Admitting: Oncology

## 2011-11-23 ENCOUNTER — Other Ambulatory Visit: Payer: Self-pay | Admitting: Lab

## 2011-11-24 ENCOUNTER — Ambulatory Visit: Payer: Self-pay | Admitting: Physical Therapy

## 2011-12-01 ENCOUNTER — Encounter: Payer: Self-pay | Admitting: Oncology

## 2011-12-01 NOTE — Progress Notes (Signed)
Faxed clinical information to [facility] to extend patients disability.

## 2011-12-19 ENCOUNTER — Ambulatory Visit (HOSPITAL_BASED_OUTPATIENT_CLINIC_OR_DEPARTMENT_OTHER): Payer: Self-pay

## 2011-12-19 DIAGNOSIS — C50919 Malignant neoplasm of unspecified site of unspecified female breast: Secondary | ICD-10-CM

## 2011-12-19 DIAGNOSIS — Z469 Encounter for fitting and adjustment of unspecified device: Secondary | ICD-10-CM

## 2011-12-19 MED ORDER — SODIUM CHLORIDE 0.9 % IJ SOLN
10.0000 mL | INTRAMUSCULAR | Status: DC | PRN
  Administered 2011-12-19: 10 mL via INTRAVENOUS
  Filled 2011-12-19: qty 10

## 2011-12-19 MED ORDER — HEPARIN SOD (PORK) LOCK FLUSH 100 UNIT/ML IV SOLN
500.0000 [IU] | Freq: Once | INTRAVENOUS | Status: AC
  Administered 2011-12-19: 500 [IU] via INTRAVENOUS
  Filled 2011-12-19: qty 5

## 2012-01-08 ENCOUNTER — Other Ambulatory Visit: Payer: Self-pay | Admitting: *Deleted

## 2012-01-08 DIAGNOSIS — C50919 Malignant neoplasm of unspecified site of unspecified female breast: Secondary | ICD-10-CM

## 2012-01-08 MED ORDER — CITALOPRAM HYDROBROMIDE 40 MG PO TABS: 40.0000 mg | ORAL_TABLET | Freq: Every day | ORAL | Status: DC

## 2012-01-24 ENCOUNTER — Telehealth: Payer: Self-pay | Admitting: *Deleted

## 2012-01-24 ENCOUNTER — Other Ambulatory Visit: Payer: Self-pay | Admitting: Physician Assistant

## 2012-01-24 ENCOUNTER — Ambulatory Visit (HOSPITAL_BASED_OUTPATIENT_CLINIC_OR_DEPARTMENT_OTHER): Payer: Self-pay | Admitting: Oncology

## 2012-01-24 ENCOUNTER — Other Ambulatory Visit (HOSPITAL_BASED_OUTPATIENT_CLINIC_OR_DEPARTMENT_OTHER): Payer: Self-pay | Admitting: Lab

## 2012-01-24 VITALS — BP 115/82 | HR 93 | Temp 98.4°F | Ht 62.5 in | Wt 233.1 lb

## 2012-01-24 DIAGNOSIS — Z170 Estrogen receptor positive status [ER+]: Secondary | ICD-10-CM

## 2012-01-24 DIAGNOSIS — C50919 Malignant neoplasm of unspecified site of unspecified female breast: Secondary | ICD-10-CM

## 2012-01-24 LAB — LACTATE DEHYDROGENASE: LDH: 105 U/L (ref 94–250)

## 2012-01-24 LAB — COMPREHENSIVE METABOLIC PANEL
ALT: 12 U/L (ref 0–35)
AST: 12 U/L (ref 0–37)
Alkaline Phosphatase: 61 U/L (ref 39–117)
Creatinine, Ser: 0.65 mg/dL (ref 0.50–1.10)
Sodium: 140 mEq/L (ref 135–145)
Total Bilirubin: 0.4 mg/dL (ref 0.3–1.2)
Total Protein: 6.8 g/dL (ref 6.0–8.3)

## 2012-01-24 LAB — CBC WITH DIFFERENTIAL/PLATELET
BASO%: 0.3 % (ref 0.0–2.0)
HCT: 38.8 % (ref 34.8–46.6)
LYMPH%: 25.7 % (ref 14.0–49.7)
MCHC: 34.9 g/dL (ref 31.5–36.0)
MCV: 87.8 fL (ref 79.5–101.0)
MONO#: 0.2 10*3/uL (ref 0.1–0.9)
MONO%: 6.2 % (ref 0.0–14.0)
NEUT%: 66.2 % (ref 38.4–76.8)
Platelets: 177 10*3/uL (ref 145–400)
RBC: 4.42 10*6/uL (ref 3.70–5.45)

## 2012-01-24 LAB — CANCER ANTIGEN 27.29: CA 27.29: 17 U/mL (ref 0–39)

## 2012-01-24 NOTE — Patient Instructions (Signed)
  TOTAL ABDOMINAL HYSTERECTOMY AND OOPHORECTOMY (TAH-BSO)

## 2012-01-24 NOTE — Telephone Encounter (Signed)
gave patient appointment for 07-2012 printed out calendar and gave to the patient 

## 2012-02-14 ENCOUNTER — Encounter: Payer: Self-pay | Admitting: *Deleted

## 2012-02-14 NOTE — Progress Notes (Unsigned)
Pt reports a small growth between her fingers and is somewhat painful. Pt has been told in the past to call this desk if "anything out of the ordinary persists for more than 2 weeks". Pt is not in active tx and has been instructed to follow up with her PCP. Pt has been instructed to call this desk if PCP not available for appt

## 2012-02-28 ENCOUNTER — Ambulatory Visit (INDEPENDENT_AMBULATORY_CARE_PROVIDER_SITE_OTHER): Payer: Self-pay | Admitting: Obstetrics & Gynecology

## 2012-02-28 ENCOUNTER — Encounter: Payer: Self-pay | Admitting: Obstetrics & Gynecology

## 2012-02-28 VITALS — BP 127/86 | HR 111 | Temp 99.6°F | Ht 63.0 in | Wt 237.1 lb

## 2012-02-28 DIAGNOSIS — Z01818 Encounter for other preprocedural examination: Secondary | ICD-10-CM

## 2012-02-28 DIAGNOSIS — C50919 Malignant neoplasm of unspecified site of unspecified female breast: Secondary | ICD-10-CM

## 2012-02-28 NOTE — Progress Notes (Signed)
History:  50 y.o. 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 She was found to be BRCA positive she underwent neoadjuvant chemotherapy. On May 12, 2011, she underwent right total and left modified radical mastectomy.  Pathology showed a 5.8 cm residual carcinoma, and 11/17 lymph nodes positive for metastatic disease. She is here today for discussion of prophylactic bilateral salpingo-oophorectomy (BSO).  No bleeding since her chemotherapy, no abnormal vaginal discharge or any other gynecologic concerns.  The following portions of the patient's history were reviewed and updated as appropriate: allergies, current medications, past family history, past medical history, past social history, past surgical history and problem list.  Review of Systems:  Pertinent items are noted in HPI.  Objective:  Physical Exam Blood pressure 127/86, pulse 111, temperature 99.6 F (37.6 C), temperature source Oral, height 5' 3" (1.6 m), weight 237 lb 1.6 oz (107.548 kg). Gen: NAD Abd: Soft, nontender and nondistended Pelvic: Normal appearing external genitalia; normal appearing vaginal mucosa and cervix.  Normal discharge.  Small uterus, no palpable masses or adnexal tenderness.  Assessment & Plan:  Patient desires prophylactic BSO; recommended laparoscopic BSO.  The risks of surgery were discussed in detail with the patient including but not limited to: bleeding which may require transfusion or reoperation; infection which may require prolonged hospitalization or re-hospitalization and antibiotic therapy; injury to bowel, bladder, ureters and major vessels or other surrounding organs; need for additional procedures including laparotomy; thromboembolic phenomenon, incisional problems and other postoperative or anesthesia complications.  Patient was told that the likelihood that her condition and symptoms will be treated effectively with this  surgical managament was very high; the postoperative expectations were also discussed in detail.  All questions were answered.  She was told that she will be contacted by our surgical scheduler regarding the time and date of her surgery; routine preoperative instructions of having nothing to eat or drink after midnight on the day prior to surgery and also coming to the hospital 1 1/2 hours prior to her time of surgery were also emphasized.  She was told she may be called for a preoperative appointment about a week prior to surgery and will be given further preoperative instructions at that visit. Printed patient education handouts about the procedure was given to the patient to review at home.     

## 2012-02-28 NOTE — Patient Instructions (Signed)
Bilateral Salpingo-Oophorectomy Removal of both fallopian tubes and ovaries is called a Bilateral Salpingo-oophorectomy (BSO). The fallopian tubes transport the egg from the ovary to the womb (uterus). The fallopian tube is also where the sperm and egg meet and become fertilized and move down into the uterus. Usually when a BSO is done, the uterus was previously removed. Removing both tubes and ovaries will:  Put you into the menopause. You will no longer have menstrual periods.   May cause you to have symptoms of menopause (hot flashes, night sweats, mood changes).   Not affect your sex drive or physical relationship.   Cause you to not be able to become pregnant (sterile).  LET YOUR CAREGIVER KNOW ABOUT:  Allergies to food or medication.   Medications taken including herbs, eye drops, over-the-counter medications, and creams.   Use of steroids (by mouth or creams).   Previous problems with anesthetics or numbing medication.   Possibility of pregnancy, if this applies.   Your smoking habits   History of blood clots (thrombophlebitis).   History of bleeding or blood problems.   Previous surgeries.   Other health problems.  RISKS AND COMPLICATIONS All surgery is associated with risks. Some of these risks are:  Injury to surrounding organs.   Bleeding.   Infection.   Blood clots in the legs or lungs.   Problems with the anesthesia.   The surgery does not help the problem.   Death.  BEFORE THE PROCEDURE  Do not take aspirin or blood thinners because it can make you bleed.   Do not eat or drink anything at least 8 hours before the surgery.   Let your caregiver know if you develop a cold or an infection.   If you are being admitted the day of surgery, arrive at least 1 hour before the surgery to read and sign the necessary forms and consents.   Arrange for help when you go home from the hospital.   If you smoke, do not smoke for at least 2 weeks before the  surgery.  PROCEDURE  You will change into a hospital gown. Then, you will be given an IV (intravenous) and a medication to relax you. You will be put to sleep with an anesthetic. Any hair on your lower belly (abdomen) will be removed, and a catheter will be placed in your bladder. The fallopian tubes and ovaries will be removed either through 2 very small cuts (incisions) or through large incision in the lower abdomen. AFTER THE PROCEDURE  You will be taken to the recovery room for 1 to 3 hours until your blood pressure, pulse, and temperature are stable and you are waking up.   If you had a laparoscopy, you may be discharged in several hours.   If you had a laparoscopy, you may have shoulder pain for a day or two from air left in the abdomen. The air can irritate the nerve that goes from the diaphragm to the shoulder.   You will be given pain medication as is necessary.   The intravenous and catheter will be removed.   Have someone available to take you home from the hospital.  HOME CARE INSTRUCTIONS   Only take over-the-counter or prescription medicines for pain, discomfort, or fever as directed by your caregiver.   Do not take aspirin. It can cause bleeding.   Do not drive when taking pain medication.   Follow your caregiver's advice regarding diet, exercise, lifting, driving, and general activities.   You may   resume your usual diet as directed and allowed.   Get plenty of rest and sleep.   Do not douche, use tampons, or have sexual intercourse until your caregiver says it is okay.   Change your bandages (dressings) as directed.   Take your temperature twice a day and write it down.   Your caregiver may recommend showers instead of baths for a few weeks.   Do not drink alcohol until your caregiver says it is okay.   If you develop constipation, you may take a mild laxative with your caregiver's permission. Bran foods and drinking fluids helps with constipation problems.    Try to have someone home with you for a week or two to help with the household activities.   Make sure you and your family understands everything about your operation and recovery.   Do not sign any legal documents until you feel normal again.   Keep all your follow-up appointments.  SEEK MEDICAL CARE IF:   There is swelling, redness, or increasing pain in the wound area.   Pus is coming from the wound.   You notice a bad smell from the wound or surgical dressing.   You have pain, redness, or swelling from the intravenous site.   The wound is breaking open (the edges are not staying together).   You feel dizzy or feel like fainting.   You develop pain or bleeding when you urinate.   You develop diarrhea.   You develop nausea and vomiting.   You develop abnormal vaginal discharge.   You develop a rash.   You have any type of abnormal reaction or develop an allergy to your medication.   You need stronger pain medication for your pain.  SEEK IMMEDIATE MEDICAL CARE IF:   You develop an unexplained temperature above 100 F (37.8 C).   You develop abdominal pain.   You develop chest pain.   You develop shortness of breath.   You pass out.   You develop pain, swelling, or redness of your leg.   You develop heavy vaginal bleeding with or without blood clots.  Document Released: 10/30/2005 Document Revised: 10/19/2011 Document Reviewed: 03/26/2009 [employer] Patient Information 2012 [employer]. 

## 2012-02-29 ENCOUNTER — Telehealth: Payer: Self-pay

## 2012-02-29 ENCOUNTER — Encounter (HOSPITAL_COMMUNITY): Payer: Self-pay | Admitting: Pharmacist

## 2012-02-29 NOTE — Telephone Encounter (Signed)
Tc tp rgd below msg. Lm on vm tcb rgd appt.

## 2012-02-29 NOTE — Telephone Encounter (Signed)
Message copied by Rolla Plate on Thu Feb 29, 2012  9:16 AM ------      Message from: Jaymes Graff      Created: Wed Feb 28, 2012  6:00 PM       please make appt to discuss below message.  thanks      ----- Message -----         From: Pierce Crane, MD         Sent: 10/26/2011   3:21 PM           To: Michael Litter, MD            Dr Normand Sloop,      pls see [facility] for consideration of TAH-BSO . She has a hstory f breast cancer with a BRCA2 phenotype. As you know pathology needs to know the background of BRCA positivity, so that they can do serial sections of ovaries and fallopian tubes.      Thanks      Pierce Crane MD

## 2012-03-01 NOTE — Telephone Encounter (Signed)
Spoke with pt rgd below msg advised pt per [place] need to have appt to [facility] surgery pt states already met with surgeon from [facility] they will do her surgery due to her not having insurance informed pt will inform ND pt voice understanding.

## 2012-03-04 ENCOUNTER — Encounter (HOSPITAL_COMMUNITY): Payer: Self-pay

## 2012-03-04 ENCOUNTER — Encounter (HOSPITAL_COMMUNITY)
Admission: RE | Admit: 2012-03-04 | Discharge: 2012-03-04 | Disposition: A | Discharge status: Home / Self Care | Payer: Self-pay | Source: Ambulatory Visit | Attending: Obstetrics & Gynecology | Admitting: Obstetrics & Gynecology

## 2012-03-04 HISTORY — DX: Polyneuropathy, unspecified: G62.9

## 2012-03-04 LAB — SURGICAL PCR SCREEN
MRSA, PCR: NEGATIVE
Staphylococcus aureus: NEGATIVE

## 2012-03-04 LAB — CBC
MCHC: 33.3 g/dL (ref 30.0–36.0)
Platelets: 162 10*3/uL (ref 150–400)
RDW: 13.8 % (ref 11.5–15.5)
WBC: 4.5 10*3/uL (ref 4.0–10.5)

## 2012-03-04 NOTE — Patient Instructions (Addendum)
21 Tina Vaughan  03/04/2012   Your procedure is scheduled on:  03/14/12  Enter through the Main Entrance of [facility] at 1230 PM Pick up the phone at the desk and dial [phone].   Call this number if you have problems the morning of surgery: [phone]   Remember:   Do not eat food:after midnight the evening before.  Do not drink clear liquids: 4 Hours before arrival.  Take these medicines the morning of surgery with A SIP OF WATER: take all AM medications as prescribed   Do not wear jewelry, make-up or nail polish.  Do not wear lotions, powders, or perfumes. You may wear deodorant.  Do not shave 48 hours prior to surgery.  Do not bring valuables to the hospital.  Contacts, dentures or bridgework may not be worn into surgery.  Leave suitcase in the car. After surgery it may be brought to your room.  For patients admitted to the hospital, checkout time is 11:00 AM the day of discharge.   Patients discharged the day of surgery will not be allowed to drive home.  Name and phone number of your driver: Friend  Special Instructions: CHG Shower Use Special Wash: 1/2 bottle night before surgery and 1/2 bottle morning of surgery.   Please read over the following fact sheets that you were given: MRSA Information

## 2012-03-04 NOTE — Pre-Procedure Instructions (Signed)
Previous EKG reviewed and accepted by Dr, Arby Barrette.  Patton Salles, RN

## 2012-03-04 NOTE — Pre-Procedure Instructions (Signed)
Pt. Has Porta Cath. Chemo completed, may be removed by surgeon soon.  Patton Salles, RN

## 2012-03-04 NOTE — Pre-Procedure Instructions (Signed)
Do not use left arm for IV, BP or Labs.  Patton Salles, RN

## 2012-03-13 MED ORDER — CEFAZOLIN SODIUM-DEXTROSE 2-3 GM-% IV SOLR
2.0000 g | INTRAVENOUS | Status: AC
  Administered 2012-03-14: 2 g via INTRAVENOUS
  Filled 2012-03-13: qty 50

## 2012-03-14 ENCOUNTER — Ambulatory Visit (HOSPITAL_COMMUNITY): Payer: Self-pay | Admitting: Anesthesiology

## 2012-03-14 ENCOUNTER — Ambulatory Visit (HOSPITAL_COMMUNITY)
Admission: RE | Admit: 2012-03-14 | Discharge: 2012-03-14 | Disposition: A | Discharge status: Home / Self Care | Payer: Self-pay | Source: Ambulatory Visit | Attending: Obstetrics & Gynecology | Admitting: Obstetrics & Gynecology

## 2012-03-14 ENCOUNTER — Encounter (HOSPITAL_COMMUNITY): Payer: Self-pay | Admitting: Anesthesiology

## 2012-03-14 ENCOUNTER — Encounter (HOSPITAL_COMMUNITY): Payer: Self-pay | Admitting: *Deleted

## 2012-03-14 ENCOUNTER — Encounter (HOSPITAL_COMMUNITY)
Admission: RE | Disposition: A | Discharge status: Home / Self Care | Payer: Self-pay | Source: Ambulatory Visit | Attending: Obstetrics & Gynecology

## 2012-03-14 DIAGNOSIS — Z01818 Encounter for other preprocedural examination: Secondary | ICD-10-CM | POA: Insufficient documentation

## 2012-03-14 DIAGNOSIS — C50919 Malignant neoplasm of unspecified site of unspecified female breast: Secondary | ICD-10-CM

## 2012-03-14 DIAGNOSIS — Z853 Personal history of malignant neoplasm of breast: Secondary | ICD-10-CM | POA: Insufficient documentation

## 2012-03-14 DIAGNOSIS — C50512 Malignant neoplasm of lower-outer quadrant of left female breast: Secondary | ICD-10-CM | POA: Diagnosis present

## 2012-03-14 DIAGNOSIS — Z1501 Genetic susceptibility to malignant neoplasm of breast: Secondary | ICD-10-CM | POA: Insufficient documentation

## 2012-03-14 DIAGNOSIS — Z4002 Encounter for prophylactic removal of ovary: Secondary | ICD-10-CM | POA: Insufficient documentation

## 2012-03-14 DIAGNOSIS — Z01812 Encounter for preprocedural laboratory examination: Secondary | ICD-10-CM | POA: Insufficient documentation

## 2012-03-14 HISTORY — PX: LAPAROSCOPY: SHX197

## 2012-03-14 HISTORY — PX: SALPINGOOPHORECTOMY: SHX82

## 2012-03-14 LAB — PREGNANCY, URINE: Preg Test, Ur: NEGATIVE

## 2012-03-14 SURGERY — LAPAROSCOPY OPERATIVE
Anesthesia: General | Site: Abdomen | Wound class: Clean

## 2012-03-14 MED ORDER — GLYCOPYRROLATE 0.2 MG/ML IJ SOLN
INTRAMUSCULAR | Status: AC
  Filled 2012-03-14: qty 1

## 2012-03-14 MED ORDER — PROPOFOL 10 MG/ML IV BOLUS
INTRAVENOUS | Status: DC | PRN
  Administered 2012-03-14: 150 mg via INTRAVENOUS

## 2012-03-14 MED ORDER — ROCURONIUM BROMIDE 100 MG/10ML IV SOLN
INTRAVENOUS | Status: DC | PRN
  Administered 2012-03-14: 50 mg via INTRAVENOUS

## 2012-03-14 MED ORDER — BUPIVACAINE-EPINEPHRINE PF 0.25-1:200000 % IJ SOLN
INTRAMUSCULAR | Status: AC
  Filled 2012-03-14: qty 30

## 2012-03-14 MED ORDER — ONDANSETRON HCL 4 MG/2ML IJ SOLN
INTRAMUSCULAR | Status: AC
  Filled 2012-03-14: qty 2

## 2012-03-14 MED ORDER — GLYCOPYRROLATE 0.2 MG/ML IJ SOLN
INTRAMUSCULAR | Status: DC | PRN
  Administered 2012-03-14: .5 mg via INTRAVENOUS

## 2012-03-14 MED ORDER — FENTANYL CITRATE 0.05 MG/ML IJ SOLN
INTRAMUSCULAR | Status: DC | PRN
  Administered 2012-03-14: 50 ug via INTRAVENOUS
  Administered 2012-03-14: 100 ug via INTRAVENOUS
  Administered 2012-03-14: 50 ug via INTRAVENOUS
  Administered 2012-03-14: 100 ug via INTRAVENOUS
  Administered 2012-03-14: 50 ug via INTRAVENOUS

## 2012-03-14 MED ORDER — LACTATED RINGERS IV SOLN
INTRAVENOUS | Status: DC
  Administered 2012-03-14: 125 mL/h via INTRAVENOUS
  Administered 2012-03-14: 15:00:00 via INTRAVENOUS

## 2012-03-14 MED ORDER — HEMOSTATIC AGENTS (NO CHARGE) OPTIME
TOPICAL | Status: DC | PRN
  Administered 2012-03-14: 1 via TOPICAL

## 2012-03-14 MED ORDER — MIDAZOLAM HCL 5 MG/5ML IJ SOLN
INTRAMUSCULAR | Status: DC | PRN
  Administered 2012-03-14: 2 mg via INTRAVENOUS

## 2012-03-14 MED ORDER — FENTANYL CITRATE 0.05 MG/ML IJ SOLN
INTRAMUSCULAR | Status: AC
  Filled 2012-03-14: qty 2

## 2012-03-14 MED ORDER — METOCLOPRAMIDE HCL 5 MG/ML IJ SOLN: 10.0000 mg | Freq: Once | INTRAMUSCULAR | Status: DC | PRN

## 2012-03-14 MED ORDER — MEPERIDINE HCL 25 MG/ML IJ SOLN: 6.2500 mg | INTRAMUSCULAR | Status: DC | PRN

## 2012-03-14 MED ORDER — 0.9 % SODIUM CHLORIDE (POUR BTL) OPTIME
TOPICAL | Status: DC | PRN
  Administered 2012-03-14: 1000 mL

## 2012-03-14 MED ORDER — NEOSTIGMINE METHYLSULFATE 1 MG/ML IJ SOLN
INTRAMUSCULAR | Status: AC
  Filled 2012-03-14: qty 10

## 2012-03-14 MED ORDER — DEXAMETHASONE SODIUM PHOSPHATE 10 MG/ML IJ SOLN
INTRAMUSCULAR | Status: AC
  Filled 2012-03-14: qty 1

## 2012-03-14 MED ORDER — LACTATED RINGERS IR SOLN
Status: DC | PRN
  Administered 2012-03-14: 3000 mL

## 2012-03-14 MED ORDER — NEOSTIGMINE METHYLSULFATE 1 MG/ML IJ SOLN
INTRAMUSCULAR | Status: DC | PRN
  Administered 2012-03-14: 4 mg via INTRAVENOUS

## 2012-03-14 MED ORDER — PROPOFOL 10 MG/ML IV EMUL
INTRAVENOUS | Status: AC
  Filled 2012-03-14: qty 20

## 2012-03-14 MED ORDER — ONDANSETRON HCL 4 MG/2ML IJ SOLN
INTRAMUSCULAR | Status: DC | PRN
  Administered 2012-03-14: 4 mg via INTRAVENOUS

## 2012-03-14 MED ORDER — FENTANYL CITRATE 0.05 MG/ML IJ SOLN
INTRAMUSCULAR | Status: AC
  Filled 2012-03-14: qty 5

## 2012-03-14 MED ORDER — BUPIVACAINE HCL (PF) 0.25 % IJ SOLN
INTRAMUSCULAR | Status: DC | PRN
  Administered 2012-03-14: 30 mL

## 2012-03-14 MED ORDER — DEXAMETHASONE SODIUM PHOSPHATE 10 MG/ML IJ SOLN
INTRAMUSCULAR | Status: DC | PRN
  Administered 2012-03-14: 10 mg via INTRAVENOUS

## 2012-03-14 MED ORDER — IBUPROFEN 600 MG PO TABS: 600.0000 mg | ORAL_TABLET | Freq: Four times a day (QID) | ORAL | Status: AC | PRN

## 2012-03-14 MED ORDER — MIDAZOLAM HCL 2 MG/2ML IJ SOLN
INTRAMUSCULAR | Status: AC
  Filled 2012-03-14: qty 2

## 2012-03-14 MED ORDER — OXYCODONE-ACETAMINOPHEN 5-325 MG PO TABS: 1.0000 | ORAL_TABLET | Freq: Four times a day (QID) | ORAL | Status: AC | PRN

## 2012-03-14 MED ORDER — ROCURONIUM BROMIDE 50 MG/5ML IV SOLN
INTRAVENOUS | Status: AC
  Filled 2012-03-14: qty 1

## 2012-03-14 MED ORDER — HYDROMORPHONE HCL PF 1 MG/ML IJ SOLN
0.2500 mg | INTRAMUSCULAR | Status: DC | PRN
  Administered 2012-03-14: 0.25 mg via INTRAVENOUS

## 2012-03-14 MED ORDER — HYDROMORPHONE HCL PF 1 MG/ML IJ SOLN
INTRAMUSCULAR | Status: AC
  Administered 2012-03-14: 0.25 mg via INTRAVENOUS
  Filled 2012-03-14: qty 1

## 2012-03-14 MED ORDER — DOCUSATE SODIUM 100 MG PO CAPS: 100.0000 mg | ORAL_CAPSULE | Freq: Two times a day (BID) | ORAL | Status: AC | PRN

## 2012-03-14 SURGICAL SUPPLY — 35 items
BARRIER ADHS 3X4 INTERCEED (GAUZE/BANDAGES/DRESSINGS) ×3 IMPLANT
CABLE HIGH FREQUENCY MONO STRZ (ELECTRODE) IMPLANT
CHLORAPREP W/TINT 26ML (MISCELLANEOUS) ×3 IMPLANT
CLOTH BEACON ORANGE TIMEOUT ST (SAFETY) ×3 IMPLANT
DERMABOND ADVANCED (GAUZE/BANDAGES/DRESSINGS) ×1
DERMABOND ADVANCED .7 DNX12 (GAUZE/BANDAGES/DRESSINGS) ×2 IMPLANT
GLOVE BIO SURGEON STRL SZ7 (GLOVE) ×3 IMPLANT
GLOVE BIOGEL PI IND STRL 6 (GLOVE) ×2 IMPLANT
GLOVE BIOGEL PI IND STRL 7.0 (GLOVE) ×6 IMPLANT
GLOVE BIOGEL PI INDICATOR 6 (GLOVE) ×1
GLOVE BIOGEL PI INDICATOR 7.0 (GLOVE) ×3
GLOVE ECLIPSE 6.0 STRL STRAW (GLOVE) ×3 IMPLANT
GLOVE INDICATOR 7.0 STRL GRN (GLOVE) ×6 IMPLANT
GOWN PREVENTION PLUS LG XLONG (DISPOSABLE) ×6 IMPLANT
HEMOSTAT SURGICEL 4X8 (HEMOSTASIS) ×3 IMPLANT
NEEDLE GYRUS 33CM (NEEDLE) IMPLANT
NEEDLE INSUFFLATION 14GA 120MM (NEEDLE) IMPLANT
NS IRRIG 1000ML POUR BTL (IV SOLUTION) ×3 IMPLANT
PACK LAPAROSCOPY BASIN (CUSTOM PROCEDURE TRAY) ×3 IMPLANT
POUCH SPECIMEN RETRIEVAL 10MM (ENDOMECHANICALS) ×3 IMPLANT
PROTECTOR NERVE ULNAR (MISCELLANEOUS) ×3 IMPLANT
SEALER TISSUE G2 CVD JAW 35 (ENDOMECHANICALS) ×2 IMPLANT
SEALER TISSUE G2 CVD JAW 45CM (ENDOMECHANICALS) ×1 IMPLANT
SET IRRIG TUBING LAPAROSCOPIC (IRRIGATION / IRRIGATOR) ×3 IMPLANT
SUT VIC AB 3-0 X1 27 (SUTURE) IMPLANT
SUT VICRYL 0 UR6 27IN ABS (SUTURE) ×3 IMPLANT
SUT VICRYL 4-0 PS2 18IN ABS (SUTURE) ×3 IMPLANT
TOWEL OR 17X24 6PK STRL BLUE (TOWEL DISPOSABLE) ×6 IMPLANT
TRAY FOLEY CATH 14FR (SET/KITS/TRAYS/PACK) ×3 IMPLANT
TROCAR XCEL NON-BLD 11X100MML (ENDOMECHANICALS) ×6 IMPLANT
TROCAR Z-THREAD BLADED 11X100M (TROCAR) ×3 IMPLANT
TROCAR Z-THREAD BLADED 5X100MM (TROCAR) ×3 IMPLANT
TROCAR Z-THREAD FIOS 11X100 BL (TROCAR) ×3 IMPLANT
WARMER LAPAROSCOPE (MISCELLANEOUS) ×3 IMPLANT
WATER STERILE IRR 1000ML POUR (IV SOLUTION) ×3 IMPLANT

## 2012-03-14 NOTE — Preoperative (Signed)
Beta Blockers   Reason not to administer Beta Blockers:Not Applicable 

## 2012-03-14 NOTE — Anesthesia Postprocedure Evaluation (Signed)
  Anesthesia Post-op Note  Patient: Tina Vaughan  Procedure(s) Performed: Procedure(s) (LRB): LAPAROSCOPY OPERATIVE (N/A) SALPINGO OOPHERECTOMY (Bilateral)  Patient is awake and responsive. Pain and nausea are reasonably well controlled. Vital signs are stable and clinically acceptable. Oxygen saturation is clinically acceptable. There are no apparent anesthetic complications at this time. Patient is ready for discharge.

## 2012-03-14 NOTE — Transfer of Care (Signed)
Immediate Anesthesia Transfer of Care Note  Patient: Tina Vaughan  Procedure(s) Performed: Procedure(s) (LRB): LAPAROSCOPY OPERATIVE (N/A) SALPINGO OOPHERECTOMY (Bilateral)  Patient Location: PACU  Anesthesia Type: General  Level of Consciousness: awake, alert , oriented and patient cooperative  Airway & Oxygen Therapy: Patient Spontanous Breathing and Patient connected to face mask oxygen  Post-op Assessment: Report given to PACU RN and Post -op Vital signs reviewed and stable  Post vital signs: Reviewed and stable  Complications: No apparent anesthesia complications

## 2012-03-14 NOTE — Anesthesia Preprocedure Evaluation (Signed)
Anesthesia Evaluation  Patient identified by MRN, date of birth, ID band Patient awake    Reviewed: Allergy & Precautions, H&P , NPO status , Patient's Chart, lab work & pertinent test results  Airway Mallampati: III TM Distance: >3 FB Neck ROM: full    Dental No notable dental hx. (+) Teeth Intact   Pulmonary sleep apnea and Continuous Positive Airway Pressure Ventilation ,  breath sounds clear to auscultation  Pulmonary exam normal       Cardiovascular Rhythm:regular Rate:Normal     Neuro/Psych PSYCHIATRIC DISORDERS  Neuromuscular disease    GI/Hepatic Neg liver ROS, GERD-  Medicated and Controlled,  Endo/Other  Morbid obesity  Renal/GU negative Renal ROS  negative genitourinary   Musculoskeletal   Abdominal Normal abdominal exam  (+)   Peds  Hematology negative hematology ROS (+)   Anesthesia Other Findings   Reproductive/Obstetrics negative OB ROS                           Anesthesia Physical Anesthesia Plan  ASA: III  Anesthesia Plan: General ETT   Post-op Pain Management:    Induction:   Airway Management Planned:   Additional Equipment:   Intra-op Plan:   Post-operative Plan:   Informed Consent: I have reviewed the patients History and Physical, chart, labs and discussed the procedure including the risks, benefits and alternatives for the proposed anesthesia with the patient or authorized representative who has indicated his/her understanding and acceptance.   Dental Advisory Given  Plan Discussed with: Anesthesiologist, CRNA and Surgeon  Anesthesia Plan Comments:         Anesthesia Quick Evaluation

## 2012-03-14 NOTE — Discharge Instructions (Signed)
Laparoscopic Surgery Care After Refer to this sheet in the next few weeks. These instructions provide you with information on caring for yourself after your procedure. Your caregiver may also give you more specific instructions. Your treatment has been planned according to current medical practices, but problems sometimes occur. Call your caregiver if you have any problems or questions after your procedure. HOME CARE INSTRUCTIONS Healing will take time. You may have discomfort, tenderness, swelling, and bruising at the surgical site for a couple of weeks. This is normal and will get better as time goes on.  Only take over-the-counter or prescription medicines for pain, discomfort, or fever as directed by your caregiver.   Do not take aspirin. It can cause bleeding.   Do not drive when taking pain medicine.   Follow your caregiver's advice regarding diet, exercise, lifting, driving, and general activities.   Resume your usual diet as directed and allowed.   Get plenty of rest and sleep.   Change your bandages (dressings) as directed by your caregiver.   Take showers instead of baths for 1 week.   Do not drink alcohol until your caregiver gives you permission.   If you develop constipation, you may take a mild laxative with your caregiver's permission. Bran foods may help with constipation problems. Drinking enough fluids to keep your urine clear or pale yellow may help as well.   Try to have someone home with you for 1 or 2 days to help around the house.   Keep all your follow-up appointments as directed by your caregiver.  SEEK MEDICIAL CARE IF:   You have swelling, redness, or increasing pain in the wound.   You have pus coming from the wound.   You notice a bad smell coming from the wound or bandage (dressing).   You have swelling, redness, or pain from the intravenous (IV) site.   Your wound breaks open.   You feel dizzy or lightheaded.   You have pain or bleeding when  you urinate.   You have persistent diarrhea.   You have persistent nausea and vomiting.   You have abnormal vaginal discharge.   You have a rash.   You have any type of abnormal reaction or develop an allergy to your medicine.   You have poor pain control with your prescribed medicine.  SEEK IMMEDIATE MEDICIAL CARE IF:   You have a fever.   You have severe abdominal pain.   You have chest pain.   You have shortness of breath.   You faint.   You have pain, swelling, or redness of your leg.   You have heavy vaginal bleeding with blood clots.  MAKE SURE YOU:  Understand these instructions.   Will watch your condition.   Will get help right away if you are not doing well or get worse.  Document Released: 10/19/2011 Document Reviewed: 10/16/2011 [facility] Patient Information 2012 [place], [place].  General Gynecological Post-Operative Instructions You may expect to feel dizzy, weak, and drowsy for as long as 24 hours after receiving the medicine that made you sleep (anesthetic).  Do not drive a car, ride a bicycle, participate in physical activities, or take public transportation until you are done taking narcotic pain medicines or as directed by your doctor.  Do not drink alcohol or take tranquilizers.  Do not take medicine that has not been prescribed by your doctor.  Do not sign important papers or make important decisions while on narcotic pain medicines.  Have a responsible person with  you.  CARE OF INCISION  Keep incision clean and dry. Take showers instead of baths until your doctor gives you permission to take baths.  Avoid heavy lifting (more than 10 pounds/4.5 kilograms), pushing, or pulling.  Avoid activities that may risk injury to your surgical site.  No sexual intercourse or placement of anything in the vagina for one week Only take prescription or over-the-counter medicines  for pain, discomfort, or fever as directed by your doctor. Do not take aspirin. It  can make you bleed. Take medicines (antibiotics) that kill germs if they are prescribed for you.  Call the office or go to the MAU if:  You feel sick to your stomach (nauseous).  You start to throw up (vomit).  You have trouble eating or drinking.  You have an oral temperature above 101.  You have constipation that is not helped by adjusting diet or increasing fluid intake. Pain medicines are a common cause of constipation.  You have any other concerns. SEEK IMMEDIATE MEDICAL CARE IF:  You have persistent dizziness.  You have difficulty breathing or a congested sounding (croupy) cough.  You have an oral temperature above 102.5, not controlled by medicine.  There is increasing pain or tenderness near or in the surgical site.

## 2012-03-14 NOTE — Op Note (Signed)
Tina Vaughan PROCEDURE DATE: 03/14/2012  PREOPERATIVE DIAGNOSIS: Diagnosis of BRCA positive, ER positive breast cancer; desires prophylactic removal of fallopian tubes and ovaries. POSTOPERATIVE DIAGNOSIS: The same PROCEDURE: Laparoscopic bilateral salpingo-oophorectomy; lysis of adhesions SURGEON:  Dr. Jaynie Collins ASSISTANT: Dr. Elsie Lincoln  INDICATIONS: 51 y.o. G1P0010 here for prophylactic surgery, with the diagnosis of BRCA positive, ER positive breast cancer who desires prophylactic removal of fallopian tubes and ovaries. Risks of surgery were discussed with the patient including but not limited to: bleeding which may require transfusion or reoperation; infection which may require antibiotics; injury to bowel, bladder, ureters or other surrounding organs; need for additional procedures; thromboembolic phenomenon, incisional problems and other postoperative/anesthesia complications. Written informed consent was obtained.    FINDINGS:  Normal ovary and fallopian tube on her right; the left adnexa was involved with adhesions from the surrounding bowel and side wall that were lysed using blunt and sharp methods.  Uterus was enlarged and had multiple subserosal fibroids.  ANESTHESIA:    General INTRAVENOUS FLUIDS: 1400  ml ESTIMATED BLOOD LOSS:100 ml URINE OUTPUT: 200 ml SPECIMENS: Bilateral fallopian tubes and ovaries COMPLICATIONS: None immediate  PROCEDURE IN DETAIL:  The patient received intravenous antibiotics and had sequential compression devices applied to her lower extremities while in the preoperative area.  She was then taken to the operating room where general anesthesia was administered and was found to be adequate.  She was placed in the dorsal lithotomy position, and was prepped and draped in a sterile manner.  A Foley catheter was inserted into her bladder and attached to constant drainage and a uterine manipulator was then advanced into the uterus .  After an adequate timeout  was performed, attention was then turned to the patient's abdomen where a 10-mm skin incision was made on the umbilical fold.  The laparoscope was placed into the abdomen with the help of the 10-mm Optivue trocar which allowed for direct visualization.  The abdomen was insufflated with carbon dioxide gas.  Adequate pneumoperitoneum was obtained, and a survey of the patient's pelvis and abdomen revealed the findings above.   Bilateral 10-mm Xcel lower quadrant ports  were then placed under direct visualization.   On the right side, the uteroovarian ligament was then clamped and transected with the Enseal device.  The right infundibulopelvic ligament was also clamped and transected allowing for salpingooophorectomy.  Excellent hemostasis was noted.  On the left side, extensive lysis of adhesions was carried out to free the adnexa from the adhesions to bowel and pelvic side wall with care given to avoid the ureters and injury to the bowel.  A combination of blunt and sharp methods were used.  Once the lysis of adhesions were done, the left uteroovarian ligament was then clamped and transected with the Enseal device; and the left infundibulopelvic ligament was also clamped and transected allowing for salpingooophorectomy. The fimbriated end of the left fallopian tube was recognized and no remnant of the fallopian tube or ovary was noted. The specimens were then removed from the abdomen through the 10-mm ports  under direct visualization.  The operative site was surveyed, and it was found to be hemostatic.  Surgicel and Interceed were placed on the left operative site.  No intraoperative injury to other surrounding organs was noted.  The abdomen was desufflated and all instruments were then removed from the patient's abdomen. The fascial incision of the umbilicus was closed with a 0 Vicryl figure of eight stitch.  All skin incisions were closed with  3-0 Vicryl subcuticular stitches and Dermabond.   The patient will be  discharged to home as per PACU criteria.  Routine postoperative instructions given.  She was prescribed Percocet, Ibuprofen and Colace.  She will follow up in the clinic on 04/11/12 for postoperative evaluation .

## 2012-03-14 NOTE — Interval H&P Note (Signed)
History and Physical Interval Note:  Tina Vaughan  has presented today for prophylactic surgery, with the diagnosis of BRCA positive, ER positive breast cancer  The various methods of treatment have been discussed with the patient and family. After consideration of risks, benefits and other options for treatment, the patient has consented to  Procedure(s): LAPAROSCOPY OPERATIVE, BILATERAL SALPINGO OOPHERECTOMY as a surgical intervention .  The patients' history has been reviewed, patient examined, no change in status, stable for surgery.  I have reviewed the patients' chart and labs.  Questions were answered to the patient's satisfaction.  To OR when ready.  Jaynie Collins, M.D. 03/14/2012 1:01 PM

## 2012-03-14 NOTE — H&P (View-Only) (Signed)
History:  51 y.o. 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 She was found to be BRCA positive she underwent neoadjuvant chemotherapy. On May 12, 2011, she underwent right total and left modified radical mastectomy.  Pathology showed a 5.8 cm residual carcinoma, and 11/17 lymph nodes positive for metastatic disease. She is here today for discussion of prophylactic bilateral salpingo-oophorectomy (BSO).  No bleeding since her chemotherapy, no abnormal vaginal discharge or any other gynecologic concerns.  The following portions of the patient's history were reviewed and updated as appropriate: allergies, current medications, past family history, past medical history, past social history, past surgical history and problem list.  Review of Systems:  Pertinent items are noted in HPI.  Objective:  Physical Exam Blood pressure 127/86, pulse 111, temperature 99.6 F (37.6 C), temperature source Oral, height 5\' 3"  (1.6 m), weight 237 lb 1.6 oz (107.548 kg). Gen: NAD Abd: Soft, nontender and nondistended Pelvic: Normal appearing external genitalia; normal appearing vaginal mucosa and cervix.  Normal discharge.  Small uterus, no palpable masses or adnexal tenderness.  Assessment & Plan:  Patient desires prophylactic BSO; recommended laparoscopic BSO.  The risks of surgery were discussed in detail with the patient including but not limited to: bleeding which may require transfusion or reoperation; infection which may require prolonged hospitalization or re-hospitalization and antibiotic therapy; injury to bowel, bladder, ureters and major vessels or other surrounding organs; need for additional procedures including laparotomy; thromboembolic phenomenon, incisional problems and other postoperative or anesthesia complications.  Patient was told that the likelihood that her condition and symptoms will be treated effectively with this  surgical managament was very high; the postoperative expectations were also discussed in detail.  All questions were answered.  She was told that she will be contacted by our surgical scheduler regarding the time and date of her surgery; routine preoperative instructions of having nothing to eat or drink after midnight on the day prior to surgery and also coming to the hospital 1 1/2 hours prior to her time of surgery were also emphasized.  She was told she may be called for a preoperative appointment about a week prior to surgery and will be given further preoperative instructions at that visit. Printed patient education handouts about the procedure was given to the patient to review at home.

## 2012-03-18 ENCOUNTER — Encounter (HOSPITAL_COMMUNITY): Payer: Self-pay | Admitting: Obstetrics & Gynecology

## 2012-03-19 ENCOUNTER — Encounter: Payer: Self-pay | Admitting: Obstetrics & Gynecology

## 2012-03-19 DIAGNOSIS — Z90722 Acquired absence of ovaries, bilateral: Secondary | ICD-10-CM | POA: Insufficient documentation

## 2012-03-28 ENCOUNTER — Telehealth (INDEPENDENT_AMBULATORY_CARE_PROVIDER_SITE_OTHER): Payer: Self-pay | Admitting: General Surgery

## 2012-03-28 NOTE — Telephone Encounter (Signed)
Spoke with patient. Will discuss PAC removal with Dr Jamey Ripa at her appt next week.

## 2012-03-28 NOTE — Telephone Encounter (Signed)
Message copied by Liliana Cline on Thu Mar 28, 2012  4:15 PM ------      Message from: Larry Sierras      Created: Thu Mar 28, 2012  4:11 PM      Regarding: [facility] RE: [facility]      Contact: [phone]       PLEASE CALL PT TO CLARIFY PAC REMOVAL?/ CS PT/THANKS GM

## 2012-03-29 ENCOUNTER — Encounter (INDEPENDENT_AMBULATORY_CARE_PROVIDER_SITE_OTHER): Payer: Self-pay | Admitting: Surgery

## 2012-04-03 ENCOUNTER — Ambulatory Visit (INDEPENDENT_AMBULATORY_CARE_PROVIDER_SITE_OTHER): Payer: Self-pay | Admitting: Surgery

## 2012-04-03 ENCOUNTER — Encounter (INDEPENDENT_AMBULATORY_CARE_PROVIDER_SITE_OTHER): Payer: Self-pay | Admitting: Surgery

## 2012-04-03 VITALS — BP 120/80 | HR 118 | Temp 97.6°F | Resp 14 | Ht 63.0 in | Wt 240.2 lb

## 2012-04-03 DIAGNOSIS — Z853 Personal history of malignant neoplasm of breast: Secondary | ICD-10-CM

## 2012-04-03 NOTE — Progress Notes (Signed)
NAME: [name]       DOB: 12/19/1960           DATE: 04/03/2012       MRN: [id number]   Tina Vaughan is a 50 y.o..female who presents for routine followup of her Left breast cnacer diagnosed in 2012 and treated with neo-adjuvant chemo, mastectomy, radiation. She has no problems or concerns on either side. She would like to get her port out and Dr Rubin has told her that is OK. She is planning to see a plastic surgeon about reconstruction  PFSH: She has had no significant changes since the last visit here.  ROS: There have been no significant changes since the last visit here  EXAM: General: The patient is alert, oriented, generally healty appearing, NAD. Mood and affect are normal.  Breasts:  S/P bilateral mastectomy. No evidence of recurrence. Marked radiation change on the left  Lymphatics: She has no axillary or supraclavicular adenopathy on either side.  Extremities: Full ROM of the surgical side with no lymphedema noted.  Data Reviewed: Notes in Epic  Impression: Doing well, with no evidence of recurrent cancer or new cancer Un-needed PAC  Plan: Will continue to follow up on an annual basis here, next visit in six months. Will schedule for port removal under local anesthesia   

## 2012-04-03 NOTE — Patient Instructions (Signed)
We will schedule you to have your port removed under local anesthesia

## 2012-04-11 ENCOUNTER — Ambulatory Visit: Payer: Self-pay | Admitting: Obstetrics & Gynecology

## 2012-04-19 ENCOUNTER — Ambulatory Visit (HOSPITAL_BASED_OUTPATIENT_CLINIC_OR_DEPARTMENT_OTHER)
Admission: RE | Admit: 2012-04-19 | Discharge: 2012-04-19 | Disposition: A | Discharge status: Home / Self Care | Payer: Self-pay | Source: Ambulatory Visit | Attending: Surgery | Admitting: Surgery

## 2012-04-19 ENCOUNTER — Encounter (HOSPITAL_BASED_OUTPATIENT_CLINIC_OR_DEPARTMENT_OTHER)
Admission: RE | Disposition: A | Discharge status: Home / Self Care | Payer: Self-pay | Source: Ambulatory Visit | Attending: Surgery

## 2012-04-19 DIAGNOSIS — Z853 Personal history of malignant neoplasm of breast: Secondary | ICD-10-CM | POA: Insufficient documentation

## 2012-04-19 DIAGNOSIS — Z452 Encounter for adjustment and management of vascular access device: Secondary | ICD-10-CM | POA: Insufficient documentation

## 2012-04-19 HISTORY — PX: PORT-A-CATH REMOVAL: SHX5289

## 2012-04-19 SURGERY — MINOR REMOVAL PORT-A-CATH
Anesthesia: LOCAL | Site: Chest | Wound class: Clean

## 2012-04-19 MED ORDER — SODIUM BICARBONATE 4 % IV SOLN
INTRAVENOUS | Status: DC | PRN
  Administered 2012-04-19 (×2): via INTRAMUSCULAR

## 2012-04-19 SURGICAL SUPPLY — 27 items
BENZOIN TINCTURE PRP APPL 2/3 (GAUZE/BANDAGES/DRESSINGS) IMPLANT
BLADE SURG 15 STRL LF DISP TIS (BLADE) ×1 IMPLANT
BLADE SURG 15 STRL SS (BLADE) ×1
CHLORAPREP W/TINT 26ML (MISCELLANEOUS) ×2 IMPLANT
CLOTH BEACON ORANGE TIMEOUT ST (SAFETY) ×2 IMPLANT
DERMABOND ADVANCED (GAUZE/BANDAGES/DRESSINGS)
DERMABOND ADVANCED .7 DNX12 (GAUZE/BANDAGES/DRESSINGS) IMPLANT
DRSG TEGADERM 4X4.75 (GAUZE/BANDAGES/DRESSINGS) IMPLANT
ELECT REM PT RETURN 9FT ADLT (ELECTROSURGICAL)
ELECTRODE REM PT RTRN 9FT ADLT (ELECTROSURGICAL) IMPLANT
GAUZE SPONGE 4X4 12PLY STRL LF (GAUZE/BANDAGES/DRESSINGS) IMPLANT
GAUZE SPONGE 4X4 16PLY XRAY LF (GAUZE/BANDAGES/DRESSINGS) IMPLANT
GLOVE BIOGEL M STRL SZ7.5 (GLOVE) ×4 IMPLANT
GLOVE EUDERMIC 7 POWDERFREE (GLOVE) ×2 IMPLANT
MARKER SKIN DUAL TIP RULER LAB (MISCELLANEOUS) ×2 IMPLANT
NDL SAFETY ECLIPSE 18X1.5 (NEEDLE) ×1 IMPLANT
NEEDLE HYPO 18GX1.5 SHARP (NEEDLE) ×1
NEEDLE HYPO 25X1 1.5 SAFETY (NEEDLE) ×4 IMPLANT
PENCIL BUTTON HOLSTER BLD 10FT (ELECTRODE) IMPLANT
STRIP CLOSURE SKIN 1/2X4 (GAUZE/BANDAGES/DRESSINGS) IMPLANT
SUT MNCRL AB 4-0 PS2 18 (SUTURE) ×2 IMPLANT
SUT VIC AB 3-0 FS2 27 (SUTURE) IMPLANT
SUT VIC AB 4-0 BRD 54 (SUTURE) IMPLANT
SUT VIC AB 4-0 P-3 18XBRD (SUTURE) IMPLANT
SUT VIC AB 4-0 P3 18 (SUTURE)
SUT VIC AB 4-0 SH 18 (SUTURE) ×2 IMPLANT
SYR CONTROL 10ML LL (SYRINGE) ×4 IMPLANT

## 2012-04-19 NOTE — Op Note (Signed)
Tina Vaughan 04-Nov-1961 [id number] 04/03/2012  Preoperative diagnosis: Un-Needed PAC  Postoperative diagnosis: Same  Procedure: Portacath Removal  Surgeon: Currie Paris, MD, FACS  Anesthesia:local   Clinical History and Indications: The patient has finished her chemotherapy and no longer needs a port. She wishes to have it removed.  Procedure: The patient was seen in the preoperative area and we confirmed the plans for the procedure as noted above. The Port-A-Cath site was identified and marked. The patient had no further questions.  The patient was then taken into the procedure room. The timeout was done. The area over the Port-A-Cath was anesthetized with 1% Xylocaine with epinephrine. I waited about 10 minutes and then the area was prepped and draped.  The old scar was opened. The capsule around the port opened and the port identified. The holding sutures were cut. The catheter was backed partially out of its tract. A figure 8 3-0 Vicryl suture was placed, the tubing removed, and the suture tied down to prevent backbleeding.  The port was then removed from its pocket. I made sure everything was dry. The incision was closed with 3-0 Vicryl, 4-0 Monocryl subcuticular, and Dermabond.  The patient tolerated the procedure well. There were no complications.  Currie Paris, MD, FACS 04/19/2012 2:33 PM

## 2012-04-19 NOTE — H&P (View-Only) (Signed)
NAME: Tina Vaughan       DOB: 05-03-1961           DATE: 04/03/2012       MRN: [id number]   TSUYAKO JOLLEY is a 51 y.o.Marland Kitchenfemale who presents for routine followup of her Left breast cnacer diagnosed in 2012 and treated with neo-adjuvant chemo, mastectomy, radiation. She has no problems or concerns on either side. She would like to get her port out and Dr Donnie Coffin has told her that is OK. She is planning to see a plastic surgeon about reconstruction  PFSH: She has had no significant changes since the last visit here.  ROS: There have been no significant changes since the last visit here  EXAM: General: The patient is alert, oriented, generally healty appearing, NAD. Mood and affect are normal.  Breasts:  S/P bilateral mastectomy. No evidence of recurrence. Marked radiation change on the left  Lymphatics: She has no axillary or supraclavicular adenopathy on either side.  Extremities: Full ROM of the surgical side with no lymphedema noted.  Data Reviewed: Notes in Epic  Impression: Doing well, with no evidence of recurrent cancer or new cancer Un-needed PAC  Plan: Will continue to follow up on an annual basis here, next visit in six months. Will schedule for port removal under local anesthesia

## 2012-04-19 NOTE — Interval H&P Note (Signed)
History and Physical Interval Note:  04/19/2012 1:52 PM  Tina Vaughan  has presented today for surgery, with the diagnosis of unneeded port a cath  The various methods of treatment have been discussed with the patient and family. After consideration of risks, benefits and other options for treatment, the patient has consented to  Procedure(s) (LRB): MINOR REMOVAL PORT-A-CATH (N/A) as a surgical intervention .  The patients' history has been reviewed, patient examined, no change in status, stable for surgery.  I have reviewed the patients' chart and labs.  Questions were answered to the patient's satisfaction.     [name]

## 2012-04-22 ENCOUNTER — Encounter (HOSPITAL_BASED_OUTPATIENT_CLINIC_OR_DEPARTMENT_OTHER): Payer: Self-pay

## 2012-04-22 ENCOUNTER — Encounter (HOSPITAL_BASED_OUTPATIENT_CLINIC_OR_DEPARTMENT_OTHER): Payer: Self-pay | Admitting: Surgery

## 2012-04-22 ENCOUNTER — Telehealth: Payer: Self-pay | Admitting: Oncology

## 2012-04-22 ENCOUNTER — Encounter: Payer: Self-pay | Admitting: *Deleted

## 2012-04-22 NOTE — Telephone Encounter (Signed)
S/w the pt and she is aware of the July appt

## 2012-04-25 ENCOUNTER — Telehealth: Payer: Self-pay | Admitting: Oncology

## 2012-04-25 ENCOUNTER — Ambulatory Visit (HOSPITAL_BASED_OUTPATIENT_CLINIC_OR_DEPARTMENT_OTHER): Payer: Self-pay | Admitting: Oncology

## 2012-04-25 ENCOUNTER — Ambulatory Visit (HOSPITAL_COMMUNITY)
Admission: RE | Admit: 2012-04-25 | Discharge: 2012-04-25 | Disposition: A | Discharge status: Home / Self Care | Payer: Self-pay | Source: Ambulatory Visit | Attending: Oncology | Admitting: Oncology

## 2012-04-25 VITALS — BP 122/85 | HR 105 | Temp 98.6°F | Ht 63.0 in | Wt 236.6 lb

## 2012-04-25 DIAGNOSIS — C50919 Malignant neoplasm of unspecified site of unspecified female breast: Secondary | ICD-10-CM | POA: Insufficient documentation

## 2012-04-25 DIAGNOSIS — M25559 Pain in unspecified hip: Secondary | ICD-10-CM | POA: Insufficient documentation

## 2012-04-25 DIAGNOSIS — Z170 Estrogen receptor positive status [ER+]: Secondary | ICD-10-CM

## 2012-04-25 MED ORDER — ANASTROZOLE 1 MG PO TABS: 1.0000 mg | ORAL_TABLET | Freq: Every day | ORAL | Status: AC

## 2012-04-25 NOTE — Telephone Encounter (Signed)
gve the pt her sept 2013 appt calendar along with the bone density appt

## 2012-04-25 NOTE — Progress Notes (Signed)
Hematology and Oncology Follow Up Visit  Tina Vaughan [id number] May 30, 1961 51 y.o. 04/25/2012 3:32 PM PCP  Principle Diagnosis: 51 yo female with hx of inflammatory rt breast cancer s/p FEC x 6 followed by taxotere x 4,  S/p MRM with ypT3N3 er+, breast cancer 05/12/11, s/p xrt completed 10/12.  Status post TAH/BSO completed March 2013., Port has been removed Pathology is negative.  Interim History:  There have been no intercurrent illness, hospitalizations or medication changes. She returns today because of hip pain which is been going on for about 2 weeks. Of note is she is also having significant hot flashes since she had her oophorectomy. In her hip there is persistent worse at nighttime she is able to and without significant pain.  Medications: I have reviewed the patient's current medications.  Allergies:  Allergies  Allergen Reactions  . Doxycycline Hydrochloride Nausea Only    Past Medical History, Surgical history, Social history, and Family History were reviewed and updated.  Review of Systems: Constitutional:  Negative for fever, chills, night sweats, anorexia, weight loss, pain. Cardiovascular: no chest pain or dyspnea on exertion Respiratory: no cough, shortness of breath, or wheezing Neurological: no TIA or stroke symptoms Dermatological: negative ENT: negative Skin Gastrointestinal: no abdominal pain, change in bowel habits, or black or bloody stools Genito-Urinary: no dysuria, trouble voiding, or hematuria Hematological and Lymphatic: negative Breast: negative for breast lumps Musculoskeletal: negative, good range of motion her right hip. Good strength no obvious neurological changes. Remaining ROS negative., difficulty sleeping  Physical Exam: Blood pressure 122/85, pulse 105, temperature 98.6 F (37 C), height 5\' 3"  (1.6 m), weight 236 lb 9.6 oz (107.321 kg). ECOG: 0 General appearance: alert, cooperative and appears stated age Head: Normocephalic, without  obvious abnormality, atraumatic Neck: no adenopathy, no carotid bruit, no JVD, supple, symmetrical, trachea midline and thyroid not enlarged, symmetric, no tenderness/mass/nodules Lymph nodes: Cervical, supraclavicular, and axillary nodes normal. Cardiac : regular rate and rhythm Pulmonary:clear to auscultation bilaterally and normal percussion bilaterally Breasts:, s/p bilateral mastectomy, no evidence of local recurrence Abdomen:soft, non-tender; bowel sounds normal; no masses,  no organomegaly Extremities negative, cyanosis, clubbing Neuro: alert, oriented, normal speech, no focal findings or movement disorder noted  Lab Results: Lab Results  Component Value Date   WBC 4.5 03/04/2012   HGB 12.5 03/04/2012   HCT 37.5 03/04/2012   MCV 89.7 03/04/2012   PLT 162 03/04/2012     Chemistry      Component Value Date/Time   NA 140 01/24/2012 1303   K 4.1 01/24/2012 1303   CL 106 01/24/2012 1303   CO2 27 01/24/2012 1303   BUN 13 01/24/2012 1303   CREATININE 0.65 01/24/2012 1303      Component Value Date/Time   CALCIUM 9.2 01/24/2012 1303   ALKPHOS 61 01/24/2012 1303   AST 12 01/24/2012 1303   ALT 12 01/24/2012 1303   BILITOT 0.4 01/24/2012 1303       Radiological Studies: chest X-ray n/a Mammogram n/a Bone density n/a  Impression and Plan: Pleasant woman with large primary breast cancer now s/p neoadjuvant chemotherapy and surgery.  Hx of amenorrhea with significan vasomotor symptoms  Patient returns with new onset right hip pain. I will go ahead and get x-rays today. She is range of motion I did not feel overly suspicious that she has occult metastatic disease. She was returned to see [name] in September. I while she was here I discussed switching her from tamoxifen to Arimidex again that she's had an  oophorectomy. discussed side effects of Arimidex for her. I will arrange for a bone density test prior to her return. We will notify her if there are any issues related to the x-rays to  More  than 50% of the visit was spent in patient-related counselling   Pierce Crane, MD 6/13/20133:32 PM

## 2012-05-21 ENCOUNTER — Other Ambulatory Visit: Payer: Self-pay | Admitting: *Deleted

## 2012-05-21 DIAGNOSIS — C50919 Malignant neoplasm of unspecified site of unspecified female breast: Secondary | ICD-10-CM

## 2012-05-21 MED ORDER — CITALOPRAM HYDROBROMIDE 40 MG PO TABS: 40.0000 mg | ORAL_TABLET | Freq: Every day | ORAL | Status: DC

## 2012-06-25 ENCOUNTER — Ambulatory Visit
Admission: RE | Admit: 2012-06-25 | Discharge: 2012-06-25 | Disposition: A | Discharge status: Home / Self Care | Payer: Self-pay | Source: Ambulatory Visit | Attending: Oncology | Admitting: Oncology

## 2012-06-25 DIAGNOSIS — C50919 Malignant neoplasm of unspecified site of unspecified female breast: Secondary | ICD-10-CM

## 2012-07-30 ENCOUNTER — Ambulatory Visit (HOSPITAL_BASED_OUTPATIENT_CLINIC_OR_DEPARTMENT_OTHER): Payer: Self-pay | Admitting: Oncology

## 2012-07-30 ENCOUNTER — Ambulatory Visit (HOSPITAL_BASED_OUTPATIENT_CLINIC_OR_DEPARTMENT_OTHER): Payer: Self-pay | Admitting: Lab

## 2012-07-30 ENCOUNTER — Other Ambulatory Visit: Payer: Self-pay | Admitting: *Deleted

## 2012-07-30 ENCOUNTER — Other Ambulatory Visit: Payer: Self-pay | Admitting: Lab

## 2012-07-30 ENCOUNTER — Telehealth: Payer: Self-pay | Admitting: *Deleted

## 2012-07-30 VITALS — BP 126/85 | HR 98 | Temp 98.8°F | Resp 20 | Ht 63.0 in | Wt 239.6 lb

## 2012-07-30 DIAGNOSIS — C773 Secondary and unspecified malignant neoplasm of axilla and upper limb lymph nodes: Secondary | ICD-10-CM

## 2012-07-30 DIAGNOSIS — Z170 Estrogen receptor positive status [ER+]: Secondary | ICD-10-CM

## 2012-07-30 DIAGNOSIS — C50919 Malignant neoplasm of unspecified site of unspecified female breast: Secondary | ICD-10-CM

## 2012-07-30 LAB — CBC WITH DIFFERENTIAL/PLATELET
BASO%: 0.4 % (ref 0.0–2.0)
Basophils Absolute: 0 10e3/uL (ref 0.0–0.1)
EOS%: 1.4 % (ref 0.0–7.0)
Eosinophils Absolute: 0.1 10e3/uL (ref 0.0–0.5)
HCT: 37 % (ref 34.8–46.6)
HGB: 13 g/dL (ref 11.6–15.9)
LYMPH%: 19.6 % (ref 14.0–49.7)
MCH: 31.5 pg (ref 25.1–34.0)
MCHC: 35.3 g/dL (ref 31.5–36.0)
MCV: 89.5 fL (ref 79.5–101.0)
MONO#: 0.5 10e3/uL (ref 0.1–0.9)
MONO%: 8 % (ref 0.0–14.0)
NEUT#: 4.2 10e3/uL (ref 1.5–6.5)
NEUT%: 70.6 % (ref 38.4–76.8)
Platelets: 172 10e3/uL (ref 145–400)
RBC: 4.14 10e6/uL (ref 3.70–5.45)
RDW: 13.9 % (ref 11.2–14.5)
WBC: 5.9 10e3/uL (ref 3.9–10.3)
lymph#: 1.2 10e3/uL (ref 0.9–3.3)

## 2012-07-30 LAB — COMPREHENSIVE METABOLIC PANEL (CC13)
ALT: 28 U/L (ref 0–55)
AST: 17 U/L (ref 5–34)
Albumin: 3.9 g/dL (ref 3.5–5.0)
Alkaline Phosphatase: 66 U/L (ref 40–150)
BUN: 13 mg/dL (ref 7.0–26.0)
CO2: 25 meq/L (ref 22–29)
Calcium: 9.4 mg/dL (ref 8.4–10.4)
Chloride: 108 meq/L — ABNORMAL HIGH (ref 98–107)
Creatinine: 0.7 mg/dL (ref 0.6–1.1)
Glucose: 83 mg/dL (ref 70–99)
Potassium: 4.1 meq/L (ref 3.5–5.1)
Sodium: 142 meq/L (ref 136–145)
Total Bilirubin: 0.3 mg/dL (ref 0.20–1.20)
Total Protein: 6.7 g/dL (ref 6.4–8.3)

## 2012-07-30 MED ORDER — OXYBUTYNIN CHLORIDE 5 MG PO TABS: ORAL_TABLET | ORAL | Status: DC

## 2012-07-30 MED ORDER — SOLIFENACIN SUCCINATE 5 MG PO TABS: 5.0000 mg | ORAL_TABLET | Freq: Every day | ORAL | Status: DC

## 2012-07-30 NOTE — Telephone Encounter (Signed)
01-23-2013 lab only  01-30-2013 md

## 2012-07-30 NOTE — Progress Notes (Signed)
Hematology and Oncology Follow Up Visit  Tina Vaughan [id number] 1961/02/20 51 y.o. 07/30/2012 1:53 PM PCP  Principle Diagnosis: 50 yo female with hx of inflammatory rt breast cancer s/p FEC x 6 followed by taxotere x 4,  S/p MRM with ypT3N3 er+, breast cancer 05/12/11, s/p xrt completed 10/12.  Status post TAH/BSO completed March 2013., Port has been removed Pathology is negative.  Interim History:  There have been no intercurrent illness, hospitalizations or medication changes. She is on Arimidex and tolerates a fairly well. She is having occasional joint pains. She is having some hot flashes and night sweats. She has also began to experience some urinary incontinence which is fairly frequent and troubling for her. She had a you are GI recently and is to go to [place] for a meeting. Medications: I have reviewed the patient's current medications.  Allergies:  Allergies  Allergen Reactions  . Doxycycline Hydrochloride Nausea Only    Past Medical History, Surgical history, Social history, and Family History were reviewed and updated.  Review of Systems: Constitutional:  Negative for fever, chills, night sweats, anorexia, weight loss, pain. Cardiovascular: no chest pain or dyspnea on exertion Respiratory: no cough, shortness of breath, or wheezing Neurological: no TIA or stroke symptoms Dermatological: negative ENT: negative Skin Gastrointestinal: no abdominal pain, change in bowel habits, or black or bloody stools Genito-Urinary: no dysuria, trouble voiding, or hematuria Hematological and Lymphatic: negative Breast: negative for breast lumps Musculoskeletal: negative, good range of motion her right hip. Good strength no obvious neurological changes. Remaining ROS negative., difficulty sleeping  Physical Exam: Blood pressure 126/85, pulse 98, temperature 98.8 F (37.1 C), temperature source Oral, resp. rate 20, height 5\' 3"  (1.6 m), weight 239 lb 9.6 oz (108.682 kg). ECOG: 0 General  appearance: alert, cooperative and appears stated age Head: Normocephalic, without obvious abnormality, atraumatic Neck: no adenopathy, no carotid bruit, no JVD, supple, symmetrical, trachea midline and thyroid not enlarged, symmetric, no tenderness/mass/nodules Lymph nodes: Cervical, supraclavicular, and axillary nodes normal. Cardiac : regular rate and rhythm Pulmonary:clear to auscultation bilaterally and normal percussion bilaterally Breasts:, s/p bilateral mastectomy, no evidence of local recurrence Abdomen:soft, non-tender; bowel sounds normal; no masses,  no organomegaly Extremities negative, cyanosis, clubbing Neuro: alert, oriented, normal speech, no focal findings or movement disorder noted  Lab Results: Lab Results  Component Value Date   WBC 4.5 03/04/2012   HGB 12.5 03/04/2012   HCT 37.5 03/04/2012   MCV 89.7 03/04/2012   PLT 162 03/04/2012     Chemistry      Component Value Date/Time   NA 140 01/24/2012 1303   K 4.1 01/24/2012 1303   CL 106 01/24/2012 1303   CO2 27 01/24/2012 1303   BUN 13 01/24/2012 1303   CREATININE 0.65 01/24/2012 1303      Component Value Date/Time   CALCIUM 9.2 01/24/2012 1303   ALKPHOS 61 01/24/2012 1303   AST 12 01/24/2012 1303   ALT 12 01/24/2012 1303   BILITOT 0.4 01/24/2012 1303        Impression and Plan: Pleasant woman with large primary breast cancer now s/p neoadjuvant chemotherapy and surgery.  Hx of amenorrhea with significan vasomotor symptoms  Moderate pannus to improve. Her axilla negative. She is is not particularly interested in pursuing reconstructive surgery. She is tolerating Arimidex pretty well we discussed using anticholinergic agent for her bladder control. I also gave her some information about urinary incontinence. I suspect this may be related to her new menopausal state. I discussed weight  loss with her as well. She knows that she needs to focus on this. I will plan to see her back in followup in 6 months time.  More than  50% of the visit was spent in patient-related counselling   Pierce Crane, MD 9/17/20131:53 PM

## 2012-09-30 ENCOUNTER — Other Ambulatory Visit: Payer: Self-pay | Admitting: *Deleted

## 2012-09-30 DIAGNOSIS — C50919 Malignant neoplasm of unspecified site of unspecified female breast: Secondary | ICD-10-CM

## 2012-09-30 MED ORDER — CITALOPRAM HYDROBROMIDE 40 MG PO TABS: 40.0000 mg | ORAL_TABLET | Freq: Every day | ORAL | Status: DC

## 2012-10-09 ENCOUNTER — Encounter (INDEPENDENT_AMBULATORY_CARE_PROVIDER_SITE_OTHER): Payer: Self-pay | Admitting: Surgery

## 2012-12-12 NOTE — Progress Notes (Signed)
ID: Tina Vaughan  DOB: 01/02/1961  MR#: [id number]  CSN#: [id number] DOS: 01/24/12  Principle Diagnosis: 52 yo female with hx of inflammatory lt breast cancer s/p FEC x 6 followed by taxotere x 4, S/p bilateral MRM with ypT3N3 er+, breast cancer 05/12/11, s/p xrt completed 10/12. On arimidex.   Interval History:   Tina Vaughan returns for f/u she is doing well, anticipates having a TAH in a few months. She denies any significant problems .  ROS:  14 point ROS is negative  Allergies  Allergen Reactions  . Doxycycline Hydrochloride Nausea Only    Current Outpatient Prescriptions  Medication Sig Dispense Refill  . anastrozole (ARIMIDEX) 1 MG tablet Take 1 mg by mouth daily.      . citalopram (CELEXA) 40 MG tablet Take 1 tablet (40 mg total) by mouth daily.  30 tablet  3  . econazole nitrate 1 % cream Apply topically daily as needed. rash      . gabapentin (NEURONTIN) 300 MG capsule Take 1 capsule (300 mg total) by mouth 3 (three) times daily.  90 capsule  11  . Ibuprofen (ADVIL PO) Take 200-400 mg by mouth every 6 (six) hours as needed. For pain      . Omeprazole Magnesium (PRILOSEC OTC PO) Take 1 capsule by mouth daily.       Marland Kitchen oxybutynin (DITROPAN) 5 MG tablet TAKE ONE TAB TWICE A DAY  60 tablet  1  . prochlorperazine (COMPAZINE) 10 MG tablet Take 10 mg by mouth every 6 (six) hours as needed.           Objective:  Filed Vitals:   01/24/12 1241  BP: 115/82  Pulse: 93  Temp: 98.4 F (36.9 C)    BMI: Body mass index is 41.95 kg/(m^2).   ECOG FS: 0  Physical Exam:   Sclerae unicteric  Oropharynx clear  No peripheral adenopathy  Lungs clear -- no rales or rhonchi  Heart regular rate and rhythm  Abdomen benign  MSK no focal spinal tenderness, no peripheral edema  Neuro nonfocal  Breast exam: cw exam is unremarkable.  Lab Results:      Chemistry      Component Value Date/Time   NA 142 07/30/2012 1430   NA 140 01/24/2012 1303   K 4.1 07/30/2012 1430   K 4.1 01/24/2012 1303   CL 108*  07/30/2012 1430   CL 106 01/24/2012 1303   CO2 25 07/30/2012 1430   CO2 27 01/24/2012 1303   BUN 13.0 07/30/2012 1430   BUN 13 01/24/2012 1303   CREATININE 0.7 07/30/2012 1430   CREATININE 0.65 01/24/2012 1303      Component Value Date/Time   CALCIUM 9.4 07/30/2012 1430   CALCIUM 9.2 01/24/2012 1303   ALKPHOS 66 07/30/2012 1430   ALKPHOS 61 01/24/2012 1303   AST 17 07/30/2012 1430   AST 12 01/24/2012 1303   ALT 28 07/30/2012 1430   ALT 12 01/24/2012 1303   BILITOT 0.30 07/30/2012 1430   BILITOT 0.4 01/24/2012 1303       Lab Results  Component Value Date   WBC 5.9 07/30/2012   HGB 13.0 07/30/2012   HCT 37.0 07/30/2012   MCV 89.5 07/30/2012   PLT 172 07/30/2012   NEUTROABS 4.2 07/30/2012    Studies/Results:  No results found.  Assessment: Hx inflammatory breast cancer s/p chemotherapy/mrm/radiation on arimidex. She is clinically NED     Plan: anticipated f/u in 6 months, after TAH-BS)  Tina Vaughan

## 2012-12-20 ENCOUNTER — Encounter: Payer: Self-pay | Admitting: Oncology

## 2012-12-20 ENCOUNTER — Telehealth: Payer: Self-pay | Admitting: *Deleted

## 2012-12-20 NOTE — Telephone Encounter (Signed)
Pt request Dr. Welton Flakes.  Confirmed new appt date and time with Larina Bras, NP at 2:00pm on 01/13/13.

## 2013-01-13 ENCOUNTER — Encounter: Payer: Self-pay | Admitting: Family

## 2013-01-13 ENCOUNTER — Ambulatory Visit (HOSPITAL_BASED_OUTPATIENT_CLINIC_OR_DEPARTMENT_OTHER): Payer: Self-pay

## 2013-01-13 ENCOUNTER — Ambulatory Visit (HOSPITAL_BASED_OUTPATIENT_CLINIC_OR_DEPARTMENT_OTHER): Payer: Self-pay | Admitting: Family

## 2013-01-13 ENCOUNTER — Telehealth: Payer: Self-pay | Admitting: Oncology

## 2013-01-13 VITALS — BP 121/84 | HR 103 | Temp 98.1°F | Resp 20 | Ht 63.0 in | Wt 240.0 lb

## 2013-01-13 DIAGNOSIS — R5381 Other malaise: Secondary | ICD-10-CM

## 2013-01-13 DIAGNOSIS — C50912 Malignant neoplasm of unspecified site of left female breast: Secondary | ICD-10-CM

## 2013-01-13 DIAGNOSIS — C50919 Malignant neoplasm of unspecified site of unspecified female breast: Secondary | ICD-10-CM

## 2013-01-13 DIAGNOSIS — F39 Unspecified mood [affective] disorder: Secondary | ICD-10-CM

## 2013-01-13 DIAGNOSIS — N959 Unspecified menopausal and perimenopausal disorder: Secondary | ICD-10-CM

## 2013-01-13 LAB — CBC WITH DIFFERENTIAL/PLATELET
Basophils Absolute: 0 10*3/uL (ref 0.0–0.1)
Eosinophils Absolute: 0.1 10*3/uL (ref 0.0–0.5)
HCT: 39.4 % (ref 34.8–46.6)
LYMPH%: 26.3 % (ref 14.0–49.7)
MCHC: 34.5 g/dL (ref 31.5–36.0)
MONO#: 0.4 10*3/uL (ref 0.1–0.9)
NEUT#: 4.2 10*3/uL (ref 1.5–6.5)
NEUT%: 65 % (ref 38.4–76.8)
Platelets: 183 10*3/uL (ref 145–400)
WBC: 6.5 10*3/uL (ref 3.9–10.3)

## 2013-01-13 LAB — COMPREHENSIVE METABOLIC PANEL
Albumin: 4 g/dL (ref 3.5–5.2)
BUN: 13 mg/dL (ref 6–23)
Calcium: 9.5 mg/dL (ref 8.4–10.5)
Chloride: 100 mEq/L (ref 96–112)
Glucose, Bld: 92 mg/dL (ref 70–99)
Potassium: 4 mEq/L (ref 3.5–5.3)

## 2013-01-13 MED ORDER — GABAPENTIN 300 MG PO CAPS: 300.0000 mg | ORAL_CAPSULE | Freq: Every day | ORAL | Status: DC

## 2013-01-13 MED ORDER — ANASTROZOLE 1 MG PO TABS: 1.0000 mg | ORAL_TABLET | Freq: Every day | ORAL | Status: DC

## 2013-01-13 MED ORDER — CITALOPRAM HYDROBROMIDE 40 MG PO TABS: 40.0000 mg | ORAL_TABLET | Freq: Every day | ORAL | Status: DC

## 2013-01-13 NOTE — Patient Instructions (Addendum)
Please contact us at [phone] if you have any questions or concerns.  Exercise including strength training and aerobics  Consume balanced meals including an increase in you protein intake ( greek yogurt, chicken and fish).

## 2013-01-13 NOTE — Progress Notes (Signed)
[facility]  [phone]) [phone] [phone]) [phone]  OFFICE PROGRESS NOTE  PATIENT: Tina Vaughan   DOB: [birth year]  MR#: [id number]  HYQ#:[id number]  XB:[id number],MWNUU A, MD Currie Paris, MD Claud Kelp, MD Jethro Bastos. Macon Large, MD Lurline Hare, MD   DIAGNOSIS: 52 year old [place], [place] woman with inflammatory carcinoma of the left breast.  PRIOR THERAPY: 1. Neoadjuvant chemotherapy with FEC from 12/07/2010 through 02/16/2011 x 6 cycles with Neulasta support.  2. Neoadjuvant chemotherapy with Taxotere from 03/01/2011 through 04/12/2011 x 4 cycles.  3. Bilateral mastectomy on 05/12/2011 with left axillary dissection and 11/21 positive lymph nodes, 5.8 cm tumor, ER 95%, PR 0%, Ki-67 61%, HER-2/neu no amplification with a ratio of 1.39. 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  4. Radiation therapy from 07/03/2011 through 08/17/2011.  5. The patient started antiestrogen therapy with Tamoxifen in 08/2011.  She was switched to Arimidex in 07/2012.  6. 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  7. Bilateral salpingo-oophorectomy in 04/2012. Uterus was retained.   CURRENT THERAPY:  Arimidex 1 mg by mouth daily with plans to continue this medication for a at least 10 years, possibly indefinitely.   INTERVAL HISTORY: Dr. Welton Flakes and I saw Tina Vaughan for followup today regarding inflammatory cancer of the left breast. She was last seen by Dr. Donnie Coffin on 07/30/2012. Since her last office visit, the patient states that she has no energy, she is experiencing frequent mood swings, hot flashes/night sweats and she's had some nausea/vomiting. The patient denies any other symptomatology.  PAST MEDICAL HISTORY: Past Medical History  Diagnosis Date  . Frequent urination   . Depression   . Sleep apnea   . Eczema   . Breast cancer, stage 3 2012  . Neuromuscular disorder    . GERD (gastroesophageal reflux disease)   . 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 Breast surgery  05/12/11    total mastectomy on right, radical mastectomy on left  . Portacath placement    . Laparoscopy  03/14/2012    Procedure: LAPAROSCOPY OPERATIVE;  Surgeon: Tereso Newcomer, MD;  Location: [facility];  Service: Gynecology;  Laterality: N/A;  . Salpingoophorectomy  03/14/2012    Procedure: SALPINGO OOPHERECTOMY;  Surgeon: Tereso Newcomer, MD;  Location: [facility];  Service: Gynecology;  Laterality: Bilateral;  . Port-a-cath removal  04/19/2012    Procedure: MINOR REMOVAL PORT-A-CATH;  Surgeon: Currie Paris, MD;  Location: [facility];  Service: General;  Laterality: N/A;     FAMILY HISTORY: Family History  Problem Relation Age of Onset  . Cancer Mother     Breast cancer and pancreatic cancer x 2  . Stroke Father   . Hypertension Father     SOCIAL HISTORY: History  Substance Use Topics  . Smoking status: Never Smoker   . Smokeless tobacco: Never Used  . Alcohol Use: Yes     Comment: social    ALLERGIES: Allergies  Allergen Reactions  . Doxycycline Hydrochloride Nausea Only     MEDICATIONS:  Current Outpatient Prescriptions  Medication Sig Dispense Refill  . anastrozole (ARIMIDEX) 1 MG tablet Take 1 tablet (1 mg total) by mouth daily.  30 tablet  36  . citalopram (CELEXA) 40 MG tablet Take 1 tablet (40 mg total) by mouth daily.  30 tablet  3  . econazole nitrate 1 % cream Apply topically daily as needed. rash      . gabapentin (NEURONTIN) 300 MG capsule Take 1 capsule (300 mg total) by mouth daily.  30 capsule  12  . Ibuprofen (ADVIL PO) Take 200-400 mg by mouth every 6 (six) hours as needed. For pain      . Omeprazole Magnesium (PRILOSEC OTC PO) Take 1 capsule by mouth daily.       Marland Kitchen oxybutynin (DITROPAN) 5 MG tablet TAKE ONE TAB TWICE A DAY  60 tablet  1  .  prochlorperazine (COMPAZINE) 10 MG tablet Take 10 mg by mouth every 6 (six) hours as needed.         No current facility-administered medications for this visit.      REVIEW OF SYSTEMS: A 10 point review of systems was completed and is negative except as noted above.    PHYSICAL EXAMINATION: BP 121/84  Pulse 103  Temp(Src) 98.1 F (36.7 C) (Oral)  Resp 20  Ht 5\' 3"  (1.6 m)  Wt 240 lb (108.863 kg)  BMI 42.52 kg/m2   General appearance: Alert, cooperative, well nourished, no apparent distress Head: Normocephalic, without obvious abnormality, atraumatic Eyes: Conjunctivae/corneas clear, PERRLA, EOMI Nose: Nares, septum and mucosa are normal, no drainage or sinus tenderness Neck: No adenopathy, supple, symmetrical, trachea midline, thyroid not enlarged, no tenderness Resp: Clear to auscultation bilaterally Cardio: Regular rate and rhythm, S1, S2 normal, no murmur, click, rub or gallop Breasts: Surgically absent bilaterally, well-healed surgical scars, bilateral lymphadenopathy, right axillary ridge, bilateral axillary fullness GI: Soft, distended, non-tender, normoactive bowel sounds, no organomegaly Extremities: Extremities normal, atraumatic, no cyanosis or edema Lymph nodes: Cervical and supraclavicular are normal Neurologic: Grossly normal    ECOG FS:   Grade 1 - Symptomatic but completely ambulatory   LAB RESULTS: Lab Results  Component Value Date   WBC 6.5 01/13/2013   NEUTROABS 4.2 01/13/2013   HGB 13.6 01/13/2013   HCT 39.4 01/13/2013   MCV 87.9 01/13/2013   PLT 183 01/13/2013      Chemistry      Component Value Date/Time   NA 137 01/13/2013 1634   NA 142 07/30/2012 1430   K 4.0 01/13/2013 1634   K 4.1 07/30/2012 1430   CL 100 01/13/2013 1634   CL 108* 07/30/2012 1430   CO2 28 01/13/2013 1634   CO2 25 07/30/2012 1430   BUN 13 01/13/2013 1634   BUN 13.0 07/30/2012 1430   CREATININE 0.72 01/13/2013 1634   CREATININE 0.7 07/30/2012 1430      Component Value Date/Time   CALCIUM 9.5  01/13/2013 1634   CALCIUM 9.4 07/30/2012 1430   ALKPHOS 73 01/13/2013 1634   ALKPHOS 66 07/30/2012 1430   AST 21 01/13/2013 1634   AST 17 07/30/2012 1430   ALT 30 01/13/2013 1634   ALT 28 07/30/2012 1430   BILITOT 0.3 01/13/2013 1634   BILITOT 0.30 07/30/2012 1430       Lab Results  Component Value Date   LABCA2 17 01/24/2012     RADIOGRAPHIC STUDIES: No results found.  ASSESSMENT: 52 y.[place]:  1. Stage IIIB, T3 N3, inflammatory left breast carcinoma, grade 2,  5.8 cm tumor, ER 95%, PR 0%, Ki-67 61%, HER-2/neu no amplification with a ratio of 1.39. Status post neoadjuvant chemotherapy with FEC x 6 cycles which was completed on 02/16/2011 and Taxotere x 4 cycles which was completed on 04/12/2011.. Status post bilateral mastectomy with axillary  node dissection on 05/12/2011. She had 11 of 21 positive lymph nodes. Status post radiation therapy that completed on 08/17/2011.  2. 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 Fatigue and mood swings.  4. Hot flashes/night sweats.   PLAN:  1. The patient will continue antiestrogen therapy with Arimidex 1 mg by mouth daily for at least 10 years, possibly indefinitely per Dr. Welton Flakes. An electronic prescription for this medication was sent to the patient's pharmacy, #30 with 36 refills today. The patient's last bone density scan was on 07/05/2012 which had a T score of -0.2 (normal).  2. We plan to see the patient again in 6 months at which time we will check a CBC and CMP.  3. The patient was asked to increase her physical activity (strength training and aerobic exercise) and consume a healthy diet including more protein (Greek yogurt, fish and chicken) for fatigue and mood swings.  An electronic prescription for Citalopram 40 mg by mouth daily #30 with 3 refills was sent to the patient's pharmacy.  4. An electronic prescription was sent to the patient's pharmacy for Gabapentin 300 mg by mouth daily #30 with 12 refills for hot  flashes and night sweats.  All questions were answered.  The patient was encouraged to contact us in the interim with any problems, questions or concerns.    Larina Bras, NP-C 01/14/2013, 5:39 PM

## 2013-01-13 NOTE — Telephone Encounter (Signed)
gv pt appt schedule for September.  °

## 2013-01-14 ENCOUNTER — Encounter: Payer: Self-pay | Admitting: Family

## 2013-01-21 ENCOUNTER — Telehealth: Payer: Self-pay | Admitting: Emergency Medicine

## 2013-01-23 ENCOUNTER — Other Ambulatory Visit: Payer: Self-pay | Admitting: Lab

## 2013-01-23 ENCOUNTER — Telehealth (INDEPENDENT_AMBULATORY_CARE_PROVIDER_SITE_OTHER): Payer: Self-pay

## 2013-01-23 NOTE — Telephone Encounter (Signed)
Pt was just seen by Dr Welton Flakes last week and now she is calling their office today with right side burning on the chest. The pt had a benign exam per Dr Welton Flakes last week. The pt had bilateral mastectomy with no reconstruction by Dr Jamey Ripa. Dr Welton Flakes is thinking the pt should be sent back to [name] for the burning on the right side but they were not exactly sure. I advised them that I would send a message to Dr Jamey Ripa for him to advise on this pt.

## 2013-01-23 NOTE — Telephone Encounter (Signed)
Left message on machine for patient to call back and ask for me. To offer follow up appt with Dr Jamey Ripa. Per Dr Jamey Ripa - burning sounds like nerve pain but he would be happy to evaluate her. I was going to offer appt with Dr Jamey Ripa next week. Awaiting call back.

## 2013-01-24 ENCOUNTER — Telehealth: Payer: Self-pay | Admitting: Emergency Medicine

## 2013-01-24 ENCOUNTER — Ambulatory Visit: Payer: Self-pay | Admitting: Adult Health

## 2013-01-24 NOTE — Telephone Encounter (Signed)
Patient has called multiple times with complaints of chest burning on right breast area. Patient has been instructed to see Dr Jamey Ripa at first available for concerns. Patient verbalized understanding.

## 2013-01-24 NOTE — Telephone Encounter (Signed)
Received call from [facility], at Dr. Milta Deiters office, that she has been attempting to call this pt as well, but has not made contact.  If she hears from her she will have the pt call for the offered appt with Dr. Jamey Ripa.

## 2013-01-30 ENCOUNTER — Ambulatory Visit: Payer: Self-pay | Admitting: Oncology

## 2013-01-31 ENCOUNTER — Encounter (INDEPENDENT_AMBULATORY_CARE_PROVIDER_SITE_OTHER): Payer: Self-pay | Admitting: Surgery

## 2013-01-31 ENCOUNTER — Ambulatory Visit (INDEPENDENT_AMBULATORY_CARE_PROVIDER_SITE_OTHER): Payer: Self-pay | Admitting: Surgery

## 2013-01-31 VITALS — BP 124/88 | HR 80 | Temp 98.6°F | Resp 16 | Ht 62.5 in | Wt 239.6 lb

## 2013-01-31 DIAGNOSIS — N644 Mastodynia: Secondary | ICD-10-CM

## 2013-01-31 NOTE — Patient Instructions (Signed)
See me again in six weeks

## 2013-01-31 NOTE — Progress Notes (Signed)
NAME: Tina Vaughan       DOB: 1961/04/02           DATE: 01/31/2013       MRN: [id number]   Tina Vaughan is a 52 y.o.[place] Kitchenfemale who presents for routine followup of her Left breast cnacer diagnosed in 2012 and treated with neo-adjuvant chemo, mastectomy, radiation. She come back because she has had some pain on the left mastectomy side. It is somewhat burning at times, and seems to be in the upper inner and upperouter areas of the skin flap. She has some discomfort in the anterior axillary area as well PFSH: She has had no significant changes since the last visit here.  ROS: There have been no significant changes since the last visit here  EXAM: General: The patient is alert, oriented, generally healty appearing, NAD. Mood and affect are normal.  Breasts:  S/P bilateral mastectomy. No evidence of recurrence.Less radiation changes noted. Seems tender along superior flap, especially laterally, but no definite mass or other abnormality  Lymphatics: She has no axillary or supraclavicular adenopathy on either side.  Extremities: Full ROM of the surgical side with no lymphedema noted.  Data Reviewed: Notes in Epic  Impression: Pain in mastectomy flap uncertain etiology. Sounds somewhat like some neuitis, possible developing a neuroma.  Plan: Since no definite abnormality on PE and sx only a few weeks, will re-evaluate in six weeks

## 2013-02-07 ENCOUNTER — Ambulatory Visit (INDEPENDENT_AMBULATORY_CARE_PROVIDER_SITE_OTHER): Payer: Self-pay | Admitting: Surgery

## 2013-02-12 ENCOUNTER — Other Ambulatory Visit: Payer: Self-pay | Admitting: *Deleted

## 2013-02-12 DIAGNOSIS — R32 Unspecified urinary incontinence: Secondary | ICD-10-CM

## 2013-02-12 MED ORDER — OXYBUTYNIN CHLORIDE 5 MG PO TABS: ORAL_TABLET | ORAL | Status: DC

## 2013-02-13 ENCOUNTER — Encounter (HOSPITAL_COMMUNITY): Payer: Self-pay | Admitting: *Deleted

## 2013-02-13 ENCOUNTER — Emergency Department (HOSPITAL_COMMUNITY)
Admission: EM | Admit: 2013-02-13 | Discharge: 2013-02-13 | Disposition: A | Discharge status: Home / Self Care | Payer: Self-pay | Attending: Emergency Medicine | Admitting: Emergency Medicine

## 2013-02-13 ENCOUNTER — Other Ambulatory Visit: Payer: Self-pay

## 2013-02-13 DIAGNOSIS — Z853 Personal history of malignant neoplasm of breast: Secondary | ICD-10-CM | POA: Insufficient documentation

## 2013-02-13 DIAGNOSIS — R109 Unspecified abdominal pain: Secondary | ICD-10-CM | POA: Insufficient documentation

## 2013-02-13 DIAGNOSIS — R51 Headache: Secondary | ICD-10-CM | POA: Insufficient documentation

## 2013-02-13 DIAGNOSIS — R350 Frequency of micturition: Secondary | ICD-10-CM | POA: Insufficient documentation

## 2013-02-13 DIAGNOSIS — G4730 Sleep apnea, unspecified: Secondary | ICD-10-CM | POA: Insufficient documentation

## 2013-02-13 DIAGNOSIS — R197 Diarrhea, unspecified: Secondary | ICD-10-CM | POA: Insufficient documentation

## 2013-02-13 DIAGNOSIS — G709 Myoneural disorder, unspecified: Secondary | ICD-10-CM | POA: Insufficient documentation

## 2013-02-13 DIAGNOSIS — F329 Major depressive disorder, single episode, unspecified: Secondary | ICD-10-CM | POA: Insufficient documentation

## 2013-02-13 DIAGNOSIS — L259 Unspecified contact dermatitis, unspecified cause: Secondary | ICD-10-CM | POA: Insufficient documentation

## 2013-02-13 DIAGNOSIS — R000 Tachycardia, unspecified: Secondary | ICD-10-CM | POA: Insufficient documentation

## 2013-02-13 DIAGNOSIS — R112 Nausea with vomiting, unspecified: Secondary | ICD-10-CM | POA: Insufficient documentation

## 2013-02-13 DIAGNOSIS — K219 Gastro-esophageal reflux disease without esophagitis: Secondary | ICD-10-CM | POA: Insufficient documentation

## 2013-02-13 DIAGNOSIS — G589 Mononeuropathy, unspecified: Secondary | ICD-10-CM | POA: Insufficient documentation

## 2013-02-13 DIAGNOSIS — R52 Pain, unspecified: Secondary | ICD-10-CM | POA: Insufficient documentation

## 2013-02-13 DIAGNOSIS — F3289 Other specified depressive episodes: Secondary | ICD-10-CM | POA: Insufficient documentation

## 2013-02-13 DIAGNOSIS — Z79899 Other long term (current) drug therapy: Secondary | ICD-10-CM | POA: Insufficient documentation

## 2013-02-13 LAB — POCT I-STAT, CHEM 8
BUN: 16 mg/dL (ref 6–23)
Calcium, Ion: 1.17 mmol/L (ref 1.12–1.23)
Chloride: 105 meq/L (ref 96–112)
Creatinine, Ser: 0.7 mg/dL (ref 0.50–1.10)
Glucose, Bld: 134 mg/dL — ABNORMAL HIGH (ref 70–99)
HCT: 43 % (ref 36.0–46.0)
Hemoglobin: 14.6 g/dL (ref 12.0–15.0)
Potassium: 4.1 meq/L (ref 3.5–5.1)
Sodium: 140 meq/L (ref 135–145)
TCO2: 28 mmol/L (ref 0–100)

## 2013-02-13 LAB — CBC WITH DIFFERENTIAL/PLATELET
Basophils Absolute: 0 10*3/uL (ref 0.0–0.1)
Basophils Relative: 0 % (ref 0–1)
Eosinophils Relative: 0 % (ref 0–5)
Lymphocytes Relative: 3 % — ABNORMAL LOW (ref 12–46)
MCV: 87.7 fL (ref 78.0–100.0)
Platelets: 155 10*3/uL (ref 150–400)
RDW: 12.9 % (ref 11.5–15.5)
WBC: 7.1 10*3/uL (ref 4.0–10.5)

## 2013-02-13 MED ORDER — METOCLOPRAMIDE HCL 5 MG/ML IJ SOLN
10.0000 mg | Freq: Once | INTRAMUSCULAR | Status: AC
  Administered 2013-02-13: 10 mg via INTRAVENOUS
  Filled 2013-02-13: qty 2

## 2013-02-13 MED ORDER — SODIUM CHLORIDE 0.9 % IV SOLN
1000.0000 mL | INTRAVENOUS | Status: DC
  Administered 2013-02-13: 1000 mL via INTRAVENOUS

## 2013-02-13 MED ORDER — SODIUM CHLORIDE 0.9 % IV SOLN
1000.0000 mL | Freq: Once | INTRAVENOUS | Status: AC
  Administered 2013-02-13: 1000 mL via INTRAVENOUS

## 2013-02-13 MED ORDER — KETOROLAC TROMETHAMINE 30 MG/ML IJ SOLN
30.0000 mg | Freq: Once | INTRAMUSCULAR | Status: AC
  Administered 2013-02-13: 30 mg via INTRAVENOUS
  Filled 2013-02-13: qty 1

## 2013-02-13 MED ORDER — MORPHINE SULFATE 4 MG/ML IJ SOLN
4.0000 mg | Freq: Once | INTRAMUSCULAR | Status: AC
  Administered 2013-02-13: 4 mg via INTRAVENOUS
  Filled 2013-02-13: qty 1

## 2013-02-13 MED ORDER — PROMETHAZINE HCL 25 MG PO TABS: 25.0000 mg | ORAL_TABLET | Freq: Four times a day (QID) | ORAL | Status: DC | PRN

## 2013-02-13 MED ORDER — DIPHENHYDRAMINE HCL 50 MG/ML IJ SOLN
25.0000 mg | Freq: Once | INTRAMUSCULAR | Status: AC
  Administered 2013-02-13: 25 mg via INTRAVENOUS
  Filled 2013-02-13: qty 1

## 2013-02-13 MED ORDER — ONDANSETRON HCL 4 MG/2ML IJ SOLN
4.0000 mg | Freq: Once | INTRAMUSCULAR | Status: AC
  Administered 2013-02-13: 4 mg via INTRAVENOUS
  Filled 2013-02-13: qty 2

## 2013-02-13 NOTE — ED Provider Notes (Signed)
Medical screening examination/treatment/procedure(s) were performed by non-physician practitioner and as supervising physician I was immediately available for consultation/collaboration.   Gavin Pound. Oletta Lamas, MD 02/13/13 2242

## 2013-02-13 NOTE — ED Notes (Signed)
Pt states started having n/v/d 8 am this morning, complaining of headache also and body aches.

## 2013-02-13 NOTE — ED Provider Notes (Signed)
History     CSN: [id number]  Arrival date & time 02/13/13  1717   First MD Initiated Contact with Patient 02/13/13 1739      Chief Complaint  Patient presents with  . Nausea  . Emesis  . Diarrhea    (Consider location/radiation/quality/duration/timing/severity/associated sxs/prior treatment) HPI Comments: Patient presents with nausea, vomiting, and diarrhea since this morning at 8 AM. It is also complaining of generalized headache and body aches. She states that she has had multiple episodes of nonbloody nonbilious vomiting and diarrhea. She states that she has tried taking some Zofran, which did not help. She states that her pain is located in her abdomen, and is not well localized. She states that she is in moderate amount of discomfort. Patient states she's been unable to keep anything down. She denies any sick contacts.  The history is provided by the patient. No language interpreter was used.    Past Medical History  Diagnosis Date  . Frequent urination   . Depression   . Sleep apnea   . Eczema   . Neuromuscular disorder   . GERD (gastroesophageal reflux disease)   . 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 Breast cancer, stage 3 2012    left    Past Surgical History  Procedure Laterality Date  . Breast surgery  05/12/11    total mastectomy on right, radical mastectomy on left  . Portacath placement    . Laparoscopy  03/14/2012    Procedure: LAPAROSCOPY OPERATIVE;  Surgeon: Tereso Newcomer, MD;  Location: [facility];  Service: Gynecology;  Laterality: N/A;  . Salpingoophorectomy  03/14/2012    Procedure: SALPINGO OOPHERECTOMY;  Surgeon: Tereso Newcomer, MD;  Location: [facility];  Service: Gynecology;  Laterality: Bilateral;  . Port-a-cath removal  04/19/2012    Procedure: MINOR REMOVAL PORT-A-CATH;  Surgeon: Currie Paris, MD;  Location: [facility];  Service: General;  Laterality: N/A;    Family History  Problem Relation Age of Onset  . Cancer  Mother     Breast cancer and pancreatic cancer x 2  . Stroke Father   . Hypertension Father     History  Substance Use Topics  . Smoking status: Never Smoker   . Smokeless tobacco: Never Used  . Alcohol Use: Yes     Comment: social    OB History   Grav Para Term Preterm Abortions TAB SAB Ect Mult Living   1    1 1           Review of Systems  All other systems reviewed and are negative.    Allergies  Doxycycline hydrochloride  Home Medications   Current Outpatient Rx  Name  Route  Sig  Dispense  Refill  . anastrozole (ARIMIDEX) 1 MG tablet   Oral   Take 1 tablet (1 mg total) by mouth daily.   30 tablet   36   . citalopram (CELEXA) 40 MG tablet   Oral   Take 1 tablet (40 mg total) by mouth daily.   30 tablet   3   . econazole nitrate 1 % cream   Topical   Apply topically daily as needed. rash         . gabapentin (NEURONTIN) 300 MG capsule   Oral   Take 1 capsule (300 mg total) by mouth daily.   30 capsule   12   . Ibuprofen (ADVIL PO)   Oral   Take 200-400  mg by mouth every 6 (six) hours as needed. For pain         . Omeprazole Magnesium (PRILOSEC OTC PO)   Oral   Take 1 capsule by mouth daily.          Marland Kitchen oxybutynin (DITROPAN) 5 MG tablet      TAKE ONE TAB TWICE A DAY   60 tablet   1   . prochlorperazine (COMPAZINE) 10 MG tablet   Oral   Take 10 mg by mouth every 6 (six) hours as needed.             BP 139/90  Pulse 144  Temp(Src) 100.1 F (37.8 C) (Oral)  Resp 18  SpO2 94%  Physical Exam  Nursing note and vitals reviewed. Constitutional: She is oriented to person, place, and time. She appears well-developed and well-nourished.  HENT:  Head: Normocephalic and atraumatic.  Eyes: Conjunctivae and EOM are normal. Pupils are equal, round, and reactive to light.  Neck: Normal range of motion. Neck supple.  Cardiovascular: Regular rhythm and normal heart sounds.  Exam reveals no gallop and no friction rub.   No murmur  heard. Tachycardic  Pulmonary/Chest: Effort normal and breath sounds normal. No respiratory distress. She has no wheezes. She has no rales. She exhibits no tenderness.  Abdominal: Soft. Bowel sounds are normal. She exhibits no distension and no mass. There is no tenderness. There is no rebound and no guarding.  Diffuse, crampy abdominal pain, no well localized abdominal pain, no right lower quadrant tenderness or McBurney's point tenderness, no right upper quadrant tenderness or Murphy sign, no left lower cord tenderness, no fluid wave, or signs of peritonitis  Musculoskeletal: Normal range of motion. She exhibits no edema and no tenderness.  Neurological: She is alert and oriented to person, place, and time.  Skin: Skin is warm and dry.  Psychiatric: She has a normal mood and affect. Her behavior is normal. Judgment and thought content normal.    ED Course  Procedures (including critical care time)  Labs Reviewed  CBC WITH DIFFERENTIAL   Results for orders placed during the hospital encounter of 02/13/13  CBC WITH DIFFERENTIAL      Result Value Range   WBC 7.1  4.0 - 10.5 K/uL   RBC 4.72  3.87 - 5.11 MIL/uL   Hemoglobin 14.4  12.0 - 15.0 g/dL   HCT 47.8  29.5 - 62.1 %   MCV 87.7  78.0 - 100.0 fL   MCH 30.5  26.0 - 34.0 pg   MCHC 34.8  30.0 - 36.0 g/dL   RDW 30.8  65.7 - 84.6 %   Platelets 155  150 - 400 K/uL   Neutrophils Relative 93 (*) 43 - 77 %   Neutro Abs 6.6  1.7 - 7.7 K/uL   Lymphocytes Relative 3 (*) 12 - 46 %   Lymphs Abs 0.2 (*) 0.7 - 4.0 K/uL   Monocytes Relative 4  3 - 12 %   Monocytes Absolute 0.3  0.1 - 1.0 K/uL   Eosinophils Relative 0  0 - 5 %   Eosinophils Absolute 0.0  0.0 - 0.7 K/uL   Basophils Relative 0  0 - 1 %   Basophils Absolute 0.0  0.0 - 0.1 K/uL  POCT I-STAT, CHEM 8      Result Value Range   Sodium 140  135 - 145 mEq/L   Potassium 4.1  3.5 - 5.1 mEq/L   Chloride 105  96 - 112 mEq/L  BUN 16  6 - 23 mg/dL   Creatinine, Ser 1.19  0.50 - 1.10  mg/dL   Glucose, Bld 147 (*) 70 - 99 mg/dL   Calcium, Ion 8.29  5.62 - 1.23 mmol/L   TCO2 28  0 - 100 mmol/L   Hemoglobin 14.6  12.0 - 15.0 g/dL   HCT 13.0  86.5 - 78.4 %      ED ECG REPORT  I personally interpreted this EKG   Date: 02/13/2013   Rate: 140  Rhythm: sinus tachycardia  QRS Axis: normal  Intervals: PR normal  ST/T Wave abnormalities: normal  Conduction Disutrbances:none  Narrative Interpretation:   Old EKG Reviewed: none available    1. Nausea vomiting and diarrhea       MDM  Patient with nausea, vomiting, and diarrhea. She is tachycardic care. Suspect this is due to the dehydration. Please to be viral gastroenteritis.  Will treat with fluids, pain meds, and zofran.  No focal abdominal tenderness or signs of acute abdomen.  Patient is feeling much better.  Tolerating PO fluids.  Still slightly tachycardic, however, in reviewing her previous progress notes her baseline HR runs 98-105.  She is 114 now, but was 144 when she arrived.  She has had 3 liters of fluid.  She is asymptomatic.  Discussed with Dr. Oletta Lamas, who agrees that we can discharge her.  PCP follow-up.  Patient is stable and ready for discharge.        Roxy Horseman, PA-C 02/13/13 2231

## 2013-02-26 NOTE — Telephone Encounter (Signed)
Erroneous

## 2013-03-04 ENCOUNTER — Encounter (INDEPENDENT_AMBULATORY_CARE_PROVIDER_SITE_OTHER): Payer: Self-pay | Admitting: Surgery

## 2013-03-04 ENCOUNTER — Ambulatory Visit (INDEPENDENT_AMBULATORY_CARE_PROVIDER_SITE_OTHER): Payer: Self-pay | Admitting: Surgery

## 2013-03-04 VITALS — BP 118/70 | HR 108 | Resp 16 | Ht 62.5 in | Wt 239.0 lb

## 2013-03-04 DIAGNOSIS — N644 Mastodynia: Secondary | ICD-10-CM

## 2013-03-04 NOTE — Patient Instructions (Signed)
We will obtain a chest x-ray just to be sure there is not something in your lung or rib cage causing her pain

## 2013-03-04 NOTE — Progress Notes (Signed)
NAME: Tina Vaughan       DOB: 01/21/1961           DATE: 03/04/2013       ZOX:[id number]  CC:  Chief Complaint  Patient presents with  . Pain    at mastectomy site    HPI: she comes back to followup because of some left anterior chest wall pain above her mastectomy scar. It may be a little bit better than it was saw her about 6 weeks ago. It is more of a burning discomfort and is not disabling at all but just worrisome and annoying.  EXAM: Vital signs: BP 118/70  Pulse 108  Resp 16  Ht 5' 2.5" (1.588 m)  Wt 239 lb (108.41 kg)  BMI 42.99 kg/m2  General: Patient alert, oriented, NAD  Chest wall: Bilateral mastectomy incisions are well healed. There is no evidence of any local recurrence, no employment for changes, very mild tenderness of the superior left flap. Lymphatics: There is no axillary adenopathy noted. IMP: chest wall pain which I think is from some neuritis or perhaps a developing neuroma although none is palpable today.  PLAN: we'll doublecheck by getting a chest x-ray to be sure there is not something in the underlying rib. Assuming that is negative, will follow her along a few months to see if this is improving, getting worse, or can make a better diagnosis of by finding something on physical examination.  Channing Savich J 03/04/2013

## 2013-04-11 ENCOUNTER — Encounter (INDEPENDENT_AMBULATORY_CARE_PROVIDER_SITE_OTHER): Payer: Self-pay | Admitting: Surgery

## 2013-06-24 ENCOUNTER — Telehealth: Payer: Self-pay | Admitting: Oncology

## 2013-07-16 ENCOUNTER — Ambulatory Visit: Payer: Self-pay | Admitting: Oncology

## 2013-07-16 ENCOUNTER — Other Ambulatory Visit: Payer: Self-pay | Admitting: Lab

## 2013-07-22 ENCOUNTER — Other Ambulatory Visit (HOSPITAL_BASED_OUTPATIENT_CLINIC_OR_DEPARTMENT_OTHER): Payer: Self-pay | Admitting: Lab

## 2013-07-22 ENCOUNTER — Ambulatory Visit (HOSPITAL_BASED_OUTPATIENT_CLINIC_OR_DEPARTMENT_OTHER): Payer: Medicare Other | Admitting: Oncology

## 2013-07-22 ENCOUNTER — Encounter: Payer: Self-pay | Admitting: Oncology

## 2013-07-22 ENCOUNTER — Telehealth: Payer: Self-pay | Admitting: *Deleted

## 2013-07-22 VITALS — BP 115/80 | HR 83 | Temp 98.3°F | Resp 18 | Ht 62.0 in | Wt 234.4 lb

## 2013-07-22 DIAGNOSIS — C50919 Malignant neoplasm of unspecified site of unspecified female breast: Secondary | ICD-10-CM

## 2013-07-22 DIAGNOSIS — C50912 Malignant neoplasm of unspecified site of left female breast: Secondary | ICD-10-CM

## 2013-07-22 LAB — COMPREHENSIVE METABOLIC PANEL (CC13)
ALT: 39 U/L (ref 0–55)
AST: 25 U/L (ref 5–34)
BUN: 11.7 mg/dL (ref 7.0–26.0)
CO2: 27 mEq/L (ref 22–29)
Creatinine: 0.8 mg/dL (ref 0.6–1.1)
Total Bilirubin: 0.57 mg/dL (ref 0.20–1.20)

## 2013-07-22 LAB — CBC WITH DIFFERENTIAL/PLATELET
BASO%: 0.6 % (ref 0.0–2.0)
LYMPH%: 23.7 % (ref 14.0–49.7)
MCHC: 34.3 g/dL (ref 31.5–36.0)
MCV: 88.3 fL (ref 79.5–101.0)
MONO%: 7.2 % (ref 0.0–14.0)
NEUT%: 66.7 % (ref 38.4–76.8)
Platelets: 191 10*3/uL (ref 145–400)
RBC: 4.42 10*6/uL (ref 3.70–5.45)

## 2013-07-22 MED ORDER — ANASTROZOLE 1 MG PO TABS: 1.0000 mg | ORAL_TABLET | Freq: Every day | ORAL | Status: DC

## 2013-07-22 MED ORDER — OMEPRAZOLE 20 MG PO CPDR: 20.0000 mg | DELAYED_RELEASE_CAPSULE | Freq: Every day | ORAL | Status: DC

## 2013-07-22 NOTE — Telephone Encounter (Signed)
appts made and printed...td 

## 2013-07-22 NOTE — Progress Notes (Signed)
[facility]  Telephone:([phone] Fax:(336) [phone]  OFFICE PROGRESS NOTE  PATIENT: Tina Vaughan   DOB: 06-18-1961  MR#: [id number]  ION#:[id number]  KG:[id number] A, MD Currie Paris, MD Claud Kelp, MD Jethro Bastos. Macon Large, MD Lurline Hare, MD   DIAGNOSIS: 52 year old [place], [place] woman with inflammatory carcinoma of the left breast.  PRIOR THERAPY: 1. Neoadjuvant chemotherapy with FEC from 12/07/2010 through 02/16/2011 x 6 cycles with Neulasta support.  2. Neoadjuvant chemotherapy with Taxotere from 03/01/2011 through 04/12/2011 x 4 cycles.  3. Bilateral mastectomy on 05/12/2011 with left axillary dissection and 11/21 positive lymph nodes, 5.8 cm tumor, ER 95%, PR 0%, Ki-67 61%, HER-2/neu no amplification with a ratio of 1.39. 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  4. Radiation therapy from 07/03/2011 through 08/17/2011.  5. The patient started antiestrogen therapy with Tamoxifen in 08/2011.  She was switched to Arimidex in 07/2012.  6. 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  7. Bilateral salpingo-oophorectomy in 04/2012. Uterus was retained.   CURRENT THERAPY:  Arimidex 1 mg by mouth daily with plans to continue this medication for a at least 10 years, possibly indefinitely.   INTERVAL HISTORY:  Tina Vaughan is seen for followup today regarding inflammatory cancer of the left breast.  Since her last office visit, the patient states that she has no energy, she is experiencing frequent mood swings, hot flashes/night sweats and she's had some nausea/vomiting. The patient denies any other symptomatology.  PAST MEDICAL HISTORY: Past Medical History  Diagnosis Date  . Frequent urination   . Depression   . Sleep apnea   . Eczema   . Neuromuscular disorder   . GERD (gastroesophageal reflux disease)   . 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 Breast cancer, stage 3 2012    left    PAST SURGICAL HISTORY: Past Surgical History  Procedure Laterality Date  . Breast surgery  05/12/11    total mastectomy on right, radical mastectomy on left  . Portacath placement    . Laparoscopy  03/14/2012    Procedure: LAPAROSCOPY OPERATIVE;  Surgeon: Tereso Newcomer, MD;  Location: [facility];  Service: Gynecology;  Laterality: N/A;  . Salpingoophorectomy  03/14/2012    Procedure: SALPINGO OOPHERECTOMY;  Surgeon: Tereso Newcomer, MD;  Location: [facility];  Service: Gynecology;  Laterality: Bilateral;  . Port-a-cath removal  04/19/2012    Procedure: MINOR REMOVAL PORT-A-CATH;  Surgeon: Currie Paris, MD;  Location: [facility];  Service: General;  Laterality: N/A;     FAMILY HISTORY: Family History  Problem Relation Age of Onset  . Cancer Mother     Breast cancer and pancreatic cancer x 2  . Stroke Father   . Hypertension Father     SOCIAL HISTORY: History  Substance Use Topics  . Smoking status: Never Smoker   . Smokeless tobacco: Never Used  . Alcohol Use: Yes     Comment: social    ALLERGIES: Allergies  Allergen Reactions  . Doxycycline Hydrochloride Nausea Only     MEDICATIONS:  Current Outpatient Prescriptions  Medication Sig Dispense Refill  . anastrozole (ARIMIDEX) 1 MG tablet Take 1 tablet (1 mg total) by mouth daily.  90 tablet  6  . citalopram (CELEXA) 40 MG tablet Take 1 tablet (40 mg total) by mouth daily.  30 tablet  3  . fluocinonide cream (  LIDEX) 0.05 % Apply 1 application topically 2 (two) times daily as needed (for rash.).      Marland Kitchen gabapentin (NEURONTIN) 300 MG capsule Take 1 capsule (300 mg total) by mouth daily.  30 capsule  12  . ibuprofen (ADVIL,MOTRIN) 200 MG tablet Take 400-600 mg by mouth every 8 (eight) hours as needed for pain.      . naproxen sodium (ANAPROX) 220 MG tablet Take 220 mg by mouth 2 (two) times daily as needed (for muscle aches.).      Marland Kitchen omeprazole  (PRILOSEC) 20 MG capsule Take 1 capsule (20 mg total) by mouth daily.  90 capsule  6  . oxybutynin (DITROPAN) 5 MG tablet Take 5 mg by mouth 2 (two) times daily.      . prochlorperazine (COMPAZINE) 10 MG tablet Take 10 mg by mouth every 6 (six) hours as needed (for nausea.).       Marland Kitchen promethazine (PHENERGAN) 25 MG tablet Take 1 tablet (25 mg total) by mouth every 6 (six) hours as needed for nausea.  12 tablet  0  . [DISCONTINUED] oxybutynin (DITROPAN) 5 MG tablet TAKE ONE TAB TWICE A DAY  60 tablet  1   No current facility-administered medications for this visit.      REVIEW OF SYSTEMS: A 10 point review of systems was completed and is negative except as noted above.    PHYSICAL EXAMINATION: BP 115/80  Pulse 83  Temp(Src) 98.3 F (36.8 C) (Oral)  Resp 18  Ht 5\' 2"  (1.575 m)  Wt 234 lb 6.4 oz (106.323 kg)  BMI 42.86 kg/m2   General appearance: Alert, cooperative, well nourished, no apparent distress Head: Normocephalic, without obvious abnormality, atraumatic Eyes: Conjunctivae/corneas clear, PERRLA, EOMI Nose: Nares, septum and mucosa are normal, no drainage or sinus tenderness Neck: No adenopathy, supple, symmetrical, trachea midline, thyroid not enlarged, no tenderness Resp: Clear to auscultation bilaterally Cardio: Regular rate and rhythm, S1, S2 normal, no murmur, click, rub or gallop Breasts: Surgically absent bilaterally, well-healed surgical scars, bilateral lymphadenopathy, right axillary ridge, bilateral axillary fullness GI: Soft, distended, non-tender, normoactive bowel sounds, no organomegaly Extremities: Extremities normal, atraumatic, no cyanosis or edema Lymph nodes: Cervical and supraclavicular are normal Neurologic: Grossly normal    ECOG FS:   Grade 1 - Symptomatic but completely ambulatory   LAB RESULTS: Lab Results  Component Value Date   WBC 4.8 07/22/2013   NEUTROABS 3.2 07/22/2013   HGB 13.4 07/22/2013   HCT 39.0 07/22/2013   MCV 88.3 07/22/2013   PLT 191  07/22/2013      Chemistry      Component Value Date/Time   NA 142 07/22/2013 1038   NA 140 02/13/2013 1812   K 4.2 07/22/2013 1038   K 4.1 02/13/2013 1812   CL 105 02/13/2013 1812   CL 108* 07/30/2012 1430   CO2 27 07/22/2013 1038   CO2 28 01/13/2013 1634   BUN 11.7 07/22/2013 1038   BUN 16 02/13/2013 1812   CREATININE 0.8 07/22/2013 1038   CREATININE 0.70 02/13/2013 1812      Component Value Date/Time   CALCIUM 9.6 07/22/2013 1038   CALCIUM 9.5 01/13/2013 1634   ALKPHOS 62 07/22/2013 1038   ALKPHOS 73 01/13/2013 1634   AST 25 07/22/2013 1038   AST 21 01/13/2013 1634   ALT 39 07/22/2013 1038   ALT 30 01/13/2013 1634   BILITOT 0.57 07/22/2013 1038   BILITOT 0.3 01/13/2013 1634       Lab Results  Component  Value Date   LABCA2 17 01/24/2012     RADIOGRAPHIC STUDIES: No results found.  ASSESSMENT: 52 y.o. [place], [place] with:  1. Stage IIIB, T3 N3, inflammatory left breast carcinoma, grade 2,  5.8 cm tumor, ER 95%, PR 0%, Ki-67 61%, HER-2/neu no amplification with a ratio of 1.39. Status post neoadjuvant chemotherapy with FEC x 6 cycles which was completed on 02/16/2011 and Taxotere x 4 cycles which was completed on 04/12/2011.. Status post bilateral mastectomy with axillary node dissection on 05/12/2011. She had 11 of 21 positive lymph nodes. Status post radiation therapy that completed on 08/17/2011.  2. 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 Fatigue and mood swings.  4. Hot flashes/night sweats.   PLAN:  1. The patient will continue antiestrogen therapy with Arimidex 1 mg by mouth daily for at least 10 years  2. We plan to see the patient again in 6 months at which time we will check a CBC and CMP.  3. The patient was asked to increase her physical activity (strength training and aerobic exercise) and consume a healthy diet including more protein (Greek yogurt, fish and chicken) for fatigue and mood swings.  An electronic prescription for Citalopram 40 mg by mouth daily #30 with 3 refills  was sent to the patient's pharmacy.   All questions were answered.  The patient was encouraged to contact us in the interim with any problems, questions or concerns.   Drue Second, MD Medical/Oncology [facility] [phone] (beeper) [phone] (Office)  07/22/2013, 11:32 AM

## 2013-07-23 LAB — VITAMIN D 25 HYDROXY (VIT D DEFICIENCY, FRACTURES): Vit D, 25-Hydroxy: 23 ng/mL — ABNORMAL LOW (ref 30–89)

## 2013-08-06 ENCOUNTER — Other Ambulatory Visit: Payer: Self-pay | Admitting: Emergency Medicine

## 2013-08-06 MED ORDER — ERGOCALCIFEROL 1.25 MG (50000 UT) PO CAPS: 50000.0000 [IU] | ORAL_CAPSULE | ORAL | Status: DC

## 2013-08-06 NOTE — Telephone Encounter (Signed)
Called patient and left message; instructed patient to pick up rx for vitamin D at [facility] and to take once a week for 8 weeks then she is to start taking Vitamin D3 2,000 iu daily. Instructed patient to call with any questions or concerns.

## 2013-08-26 ENCOUNTER — Other Ambulatory Visit: Payer: Self-pay | Admitting: Oncology

## 2013-08-26 DIAGNOSIS — C50912 Malignant neoplasm of unspecified site of left female breast: Secondary | ICD-10-CM

## 2013-09-18 ENCOUNTER — Other Ambulatory Visit: Payer: Self-pay

## 2013-09-23 ENCOUNTER — Other Ambulatory Visit: Payer: Self-pay

## 2013-09-23 DIAGNOSIS — C50912 Malignant neoplasm of unspecified site of left female breast: Secondary | ICD-10-CM

## 2013-09-23 DIAGNOSIS — C50919 Malignant neoplasm of unspecified site of unspecified female breast: Secondary | ICD-10-CM

## 2013-09-23 MED ORDER — CITALOPRAM HYDROBROMIDE 40 MG PO TABS: 40.0000 mg | ORAL_TABLET | Freq: Every day | ORAL | Status: DC

## 2013-12-09 ENCOUNTER — Telehealth: Payer: Self-pay | Admitting: Emergency Medicine

## 2013-12-09 NOTE — Telephone Encounter (Signed)
Patient called requesting something for sleep. Patient states she has taken Ambien 10mg  in the past and has also tried Xanax which gave her more relief. Patient is due to be seen in this office for follow up on 01/19/14.

## 2013-12-09 NOTE — Telephone Encounter (Signed)
May give small prescription for xanax 0.5 mg #30/0

## 2013-12-10 ENCOUNTER — Other Ambulatory Visit: Payer: Self-pay | Admitting: Emergency Medicine

## 2013-12-10 MED ORDER — ALPRAZOLAM 0.5 MG PO TABS: 0.5000 mg | ORAL_TABLET | Freq: Every evening | ORAL | Status: DC | PRN

## 2014-01-19 ENCOUNTER — Encounter: Payer: Self-pay | Admitting: Oncology

## 2014-01-19 ENCOUNTER — Other Ambulatory Visit (HOSPITAL_BASED_OUTPATIENT_CLINIC_OR_DEPARTMENT_OTHER): Payer: Medicare Other

## 2014-01-19 ENCOUNTER — Telehealth: Payer: Self-pay | Admitting: Oncology

## 2014-01-19 ENCOUNTER — Ambulatory Visit (HOSPITAL_BASED_OUTPATIENT_CLINIC_OR_DEPARTMENT_OTHER): Payer: Medicare Other | Admitting: Oncology

## 2014-01-19 VITALS — BP 127/89 | HR 93 | Temp 98.0°F | Resp 20 | Ht 62.0 in | Wt 243.2 lb

## 2014-01-19 DIAGNOSIS — C50519 Malignant neoplasm of lower-outer quadrant of unspecified female breast: Secondary | ICD-10-CM

## 2014-01-19 DIAGNOSIS — C50419 Malignant neoplasm of upper-outer quadrant of unspecified female breast: Secondary | ICD-10-CM

## 2014-01-19 DIAGNOSIS — R5383 Other fatigue: Secondary | ICD-10-CM

## 2014-01-19 DIAGNOSIS — Z170 Estrogen receptor positive status [ER+]: Secondary | ICD-10-CM | POA: Diagnosis not present

## 2014-01-19 DIAGNOSIS — F411 Generalized anxiety disorder: Secondary | ICD-10-CM | POA: Diagnosis not present

## 2014-01-19 DIAGNOSIS — F39 Unspecified mood [affective] disorder: Secondary | ICD-10-CM | POA: Diagnosis not present

## 2014-01-19 DIAGNOSIS — R5381 Other malaise: Secondary | ICD-10-CM

## 2014-01-19 DIAGNOSIS — G4700 Insomnia, unspecified: Secondary | ICD-10-CM | POA: Diagnosis not present

## 2014-01-19 DIAGNOSIS — C773 Secondary and unspecified malignant neoplasm of axilla and upper limb lymph nodes: Secondary | ICD-10-CM | POA: Diagnosis not present

## 2014-01-19 DIAGNOSIS — C50919 Malignant neoplasm of unspecified site of unspecified female breast: Secondary | ICD-10-CM

## 2014-01-19 DIAGNOSIS — R61 Generalized hyperhidrosis: Secondary | ICD-10-CM | POA: Diagnosis not present

## 2014-01-19 DIAGNOSIS — C50912 Malignant neoplasm of unspecified site of left female breast: Secondary | ICD-10-CM

## 2014-01-19 DIAGNOSIS — M8580 Other specified disorders of bone density and structure, unspecified site: Secondary | ICD-10-CM

## 2014-01-19 LAB — CBC WITH DIFFERENTIAL/PLATELET
BASO%: 0.5 % (ref 0.0–2.0)
Basophils Absolute: 0 10*3/uL (ref 0.0–0.1)
EOS ABS: 0.2 10*3/uL (ref 0.0–0.5)
EOS%: 2.4 % (ref 0.0–7.0)
HCT: 40 % (ref 34.8–46.6)
HGB: 13.6 g/dL (ref 11.6–15.9)
LYMPH%: 25.8 % (ref 14.0–49.7)
MCH: 30.5 pg (ref 25.1–34.0)
MCHC: 33.9 g/dL (ref 31.5–36.0)
MCV: 89.9 fL (ref 79.5–101.0)
MONO#: 0.5 10*3/uL (ref 0.1–0.9)
MONO%: 6.8 % (ref 0.0–14.0)
NEUT%: 64.5 % (ref 38.4–76.8)
NEUTROS ABS: 4.7 10*3/uL (ref 1.5–6.5)
Platelets: 191 10*3/uL (ref 145–400)
RBC: 4.45 10*6/uL (ref 3.70–5.45)
RDW: 13.4 % (ref 11.2–14.5)
WBC: 7.3 10*3/uL (ref 3.9–10.3)
lymph#: 1.9 10*3/uL (ref 0.9–3.3)

## 2014-01-19 LAB — COMPREHENSIVE METABOLIC PANEL (CC13)
ALBUMIN: 4 g/dL (ref 3.5–5.0)
ALK PHOS: 76 U/L (ref 40–150)
ALT: 23 U/L (ref 0–55)
AST: 12 U/L (ref 5–34)
Anion Gap: 10 mEq/L (ref 3–11)
BUN: 13.2 mg/dL (ref 7.0–26.0)
CO2: 26 mEq/L (ref 22–29)
Calcium: 9.6 mg/dL (ref 8.4–10.4)
Chloride: 107 mEq/L (ref 98–109)
Creatinine: 0.8 mg/dL (ref 0.6–1.1)
Glucose: 116 mg/dl (ref 70–140)
POTASSIUM: 3.7 meq/L (ref 3.5–5.1)
SODIUM: 142 meq/L (ref 136–145)
TOTAL PROTEIN: 7.1 g/dL (ref 6.4–8.3)
Total Bilirubin: 0.33 mg/dL (ref 0.20–1.20)

## 2014-01-19 NOTE — Telephone Encounter (Signed)
, °

## 2014-01-19 NOTE — Progress Notes (Signed)
[facility]  Telephone:(336) [phone] Fax:(336) [phone]  OFFICE PROGRESS NOTE  PATIENT: Tina Vaughan   DOB: 06-26-1961  MR#: [id number]  RCV#:[id number]  ZW:[id number] A, MD Haywood Lasso, MD Fanny Skates, MD Sallyanne Havers. Harolyn Rutherford, MD Thea Silversmith, MD   DIAGNOSIS: 53 year old [place], [place] woman with inflammatory carcinoma of the left breast.  L Breast Cancer, Inflammatory, ER+,HER2-, Stage III   Primary site: Breast   Staging method: AJCC 7th Edition   Pathologic: Stage IIIC (T4d, N3, cM0) signed by [name], MD on 01/19/2014  9:51 AM   Summary: Stage IIIC (T4d, N3, cM0)  PRIOR THERAPY: 1. Neoadjuvant chemotherapy with FEC from 12/07/2010 through 02/16/2011 x 6 cycles with Neulasta support.  2. Neoadjuvant chemotherapy with Taxotere from 03/01/2011 through 04/12/2011 x 4 cycles.  3. Bilateral mastectomy on 05/12/2011 with left axillary dissection and 11/21 positive lymph nodes, 5.8 cm tumor, ER 95%, PR 0%, Ki-67 61%, HER-2/neu no amplification with a ratio of 1.39. 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  4. Radiation therapy from 07/03/2011 through 08/17/2011.  5. The patient started antiestrogen therapy with Tamoxifen in 08/2011.  She was switched to Arimidex in 07/2012. 10 years of therapy planned with possibility of indefinite  6. 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  7. Bilateral salpingo-oophorectomy in 04/2012. Uterus was retained.   CURRENT THERAPY:  Arimidex 1 mg by mouth daily   INTERVAL HISTORY:  Ms. Tina Vaughan is seen for followup today regarding inflammatory cancer of the left breast. She is feeling really well. She is taking everything one day at a time. I do think the celexa has helped with her mood swings that she had been experiencing.Today she denies any headaches double vision blurring of vision fevers chills night sweats. No  shortness of breath chest pains palpitations. No abdominal pain no diarrhea or constipation. She has no easy bruising or bleeding. She has no myalgias and arthralgias. No peripheral paresthesias or gait disturbances. Remainder of the 10 point review of systems is negative.  PAST MEDICAL HISTORY: Past Medical History  Diagnosis Date  . Frequent urination   . Depression   . Sleep apnea   . Eczema   . Neuromuscular disorder   . GERD (gastroesophageal reflux disease)   . 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 Breast cancer, stage 3 2012    left    PAST SURGICAL HISTORY: Past Surgical History  Procedure Laterality Date  . Breast surgery  05/12/11    total mastectomy on right, radical mastectomy on left  . Portacath placement    . Laparoscopy  03/14/2012    Procedure: LAPAROSCOPY OPERATIVE;  Surgeon: Osborne Oman, MD;  Location: [facility];  Service: Gynecology;  Laterality: N/A;  . Salpingoophorectomy  03/14/2012    Procedure: SALPINGO OOPHERECTOMY;  Surgeon: Osborne Oman, MD;  Location: [facility];  Service: Gynecology;  Laterality: Bilateral;  . Port-a-cath removal  04/19/2012    Procedure: MINOR REMOVAL PORT-A-CATH;  Surgeon: Haywood Lasso, MD;  Location: [facility];  Service: General;  Laterality: N/A;     FAMILY HISTORY: Family History  Problem Relation Age of Onset  . Cancer Mother     Breast cancer and pancreatic cancer x 2  . Stroke Father   . Hypertension Father     SOCIAL HISTORY: History  Substance Use Topics  . Smoking status:  Never Smoker   . Smokeless tobacco: Never Used  . Alcohol Use: Yes     Comment: social    ALLERGIES: Allergies  Allergen Reactions  . Doxycycline Hydrochloride Nausea Only     MEDICATIONS:  Current Outpatient Prescriptions  Medication Sig Dispense Refill  . ALPRAZolam (XANAX) 0.5 MG tablet Take 1 tablet (0.5 mg total) by mouth at bedtime as needed for sleep.  30 tablet  0  . anastrozole (ARIMIDEX) 1  MG tablet Take 1 tablet (1 mg total) by mouth daily.  90 tablet  6  . citalopram (CELEXA) 40 MG tablet Take 1 tablet (40 mg total) by mouth daily.  30 tablet  4  . fluocinonide cream (LIDEX) 2.56 % Apply 1 application topically 2 (two) times daily as needed (for rash.).      Marland Kitchen gabapentin (NEURONTIN) 300 MG capsule Take 1 capsule (300 mg total) by mouth daily.  30 capsule  12  . ibuprofen (ADVIL,MOTRIN) 200 MG tablet Take 400-600 mg by mouth every 8 (eight) hours as needed for pain.      . naproxen sodium (ANAPROX) 220 MG tablet Take 220 mg by mouth 2 (two) times daily as needed (for muscle aches.).      Marland Kitchen omeprazole (PRILOSEC) 20 MG capsule Take 1 capsule (20 mg total) by mouth daily.  90 capsule  6  . prochlorperazine (COMPAZINE) 10 MG tablet Take 10 mg by mouth every 6 (six) hours as needed (for nausea.).       Marland Kitchen promethazine (PHENERGAN) 25 MG tablet Take 1 tablet (25 mg total) by mouth every 6 (six) hours as needed for nausea.  12 tablet  0  . [DISCONTINUED] oxybutynin (DITROPAN) 5 MG tablet TAKE ONE TAB TWICE A DAY  60 tablet  1   No current facility-administered medications for this visit.      REVIEW OF SYSTEMS: A 10 point review of systems was completed and is negative except as noted above.    PHYSICAL EXAMINATION: BP 127/89  Pulse 93  Temp(Src) 98 F (36.7 C) (Oral)  Resp 20  Ht $R'5\' 2"'hE$  (1.575 m)  Wt 243 lb 3.2 oz (110.315 kg)  BMI 44.47 kg/m2   General appearance: Alert, cooperative, well nourished, no apparent distress Head: Normocephalic, without obvious abnormality, atraumatic Eyes: Conjunctivae/corneas clear, PERRLA, EOMI Nose: Nares, septum and mucosa are normal, no drainage or sinus tenderness Neck: No adenopathy, supple, symmetrical, trachea midline, thyroid not enlarged, no tenderness Resp: Clear to auscultation bilaterally Cardio: Regular rate and rhythm, S1, S2 normal, no murmur, click, rub or gallop Breasts: Surgically absent bilaterally, well-healed surgical  scars, bilateral lymphadenopathy, right axillary ridge, bilateral axillary fullness GI: Soft, distended, non-tender, normoactive bowel sounds, no organomegaly Extremities: Extremities normal, atraumatic, no cyanosis or edema Lymph nodes: Cervical and supraclavicular are normal Neurologic: Grossly normal    ECOG FS:   Grade 1 - Symptomatic but completely ambulatory   LAB RESULTS: Lab Results  Component Value Date   WBC 7.3 01/19/2014   NEUTROABS 4.7 01/19/2014   HGB 13.6 01/19/2014   HCT 40.0 01/19/2014   MCV 89.9 01/19/2014   PLT 191 01/19/2014      Chemistry      Component Value Date/Time   NA 142 07/22/2013 1038   NA 140 02/13/2013 1812   K 4.2 07/22/2013 1038   K 4.1 02/13/2013 1812   CL 105 02/13/2013 1812   CL 108* 07/30/2012 1430   CO2 27 07/22/2013 1038   CO2 28 01/13/2013 1634  BUN 11.7 07/22/2013 1038   BUN 16 02/13/2013 1812   CREATININE 0.8 07/22/2013 1038   CREATININE 0.70 02/13/2013 1812      Component Value Date/Time   CALCIUM 9.6 07/22/2013 1038   CALCIUM 9.5 01/13/2013 1634   ALKPHOS 62 07/22/2013 1038   ALKPHOS 73 01/13/2013 1634   AST 25 07/22/2013 1038   AST 21 01/13/2013 1634   ALT 39 07/22/2013 1038   ALT 30 01/13/2013 1634   BILITOT 0.57 07/22/2013 1038   BILITOT 0.3 01/13/2013 1634       Lab Results  Component Value Date   LABCA2 17 01/24/2012     RADIOGRAPHIC STUDIES: No results found.  ASSESSMENT/PLAN: 53 y.o. [place], [place] with:  1. Stage IIIB, T3 N3, inflammatory left breast carcinoma, grade 2,  5.8 cm tumor, ER 95%, PR 0%, Ki-67 61%, HER-2/neu no amplification with a ratio of 1.39. Status post neoadjuvant chemotherapy with FEC x 6 cycles which was completed on 02/16/2011 and Taxotere x 4 cycles which was completed on 04/12/2011.. Status post bilateral mastectomy with axillary node dissection on 05/12/2011. She had 11 of 21 positive lymph nodes. Status post radiation therapy that completed on 08/17/2011.  2. 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 Fatigue and mood  swings.  4. Hot flashes/night sweats.  5. The patient will continue antiestrogen therapy with Arimidex 1 mg by mouth daily for at least 10 years  6. We plan to see the patient again in 6 months at which time we will check a CBC and CMP.  7. Anxiety: on celexa tolerating it well  8. Will order bone density scan prior to next visit   9. Insomnia: patient has been using xanax for this but recommended that she see her PCP for an alternative prescription.  10. Follow up: 6 months with cbc, CMET   All questions were answered.  The patient was encouraged to contact us in the interim with any problems, questions or concerns. The length of time of the face-to-face encounter was 30    minutes. More than 50% of time was spent counseling and coordination of care.   Marcy Panning, MD Medical/Oncology [facility] [phone] (beeper) [phone] (Office)  01/19/2014, 9:50 AM

## 2014-01-19 NOTE — Patient Instructions (Signed)
Anastrozole tablets What is this medicine? ANASTROZOLE (an AS troe zole) is used to treat breast cancer in women who have gone through menopause. Some types of breast cancer depend on estrogen to grow, and this medicine can stop tumor growth by blocking estrogen production. This medicine may be used for other purposes; ask your health care provider or pharmacist if you have questions. COMMON BRAND NAME(S): Arimidex What should I tell my health care provider before I take this medicine? They need to know if you have any of these conditions: -liver disease -an unusual or allergic reaction to anastrozole, other medicines, foods, dyes, or preservatives -pregnant or trying to get pregnant -breast-feeding How should I use this medicine? Take this medicine by mouth with a glass of water. Follow the directions on the prescription label. You can take this medicine with or without food. Take your doses at regular intervals. Do not take your medicine more often than directed. Do not stop taking except on the advice of your doctor or health care professional. Talk to your pediatrician regarding the use of this medicine in children. Special care may be needed. Overdosage: If you think you have taken too much of this medicine contact a poison control center or emergency room at once. NOTE: This medicine is only for you. Do not share this medicine with others. What if I miss a dose? If you miss a dose, take it as soon as you can. If it is almost time for your next dose, take only that dose. Do not take double or extra doses. What may interact with this medicine? Do not take this medicine with any of the following medications: -female hormones, like estrogens or progestins and birth control pills This medicine may also interact with the following medications: -tamoxifen This list may not describe all possible interactions. Give your health care provider a list of all the medicines, herbs, non-prescription  drugs, or dietary supplements you use. Also tell them if you smoke, drink alcohol, or use illegal drugs. Some items may interact with your medicine. What should I watch for while using this medicine? Visit your doctor or health care professional for regular checks on your progress. Let your doctor or health care professional know about any unusual vaginal bleeding. Do not treat yourself for diarrhea, nausea, vomiting or other side effects. Ask your doctor or health care professional for advice. What side effects may I notice from receiving this medicine? Side effects that you should report to your doctor or health care professional as soon as possible: -allergic reactions like skin rash, itching or hives, swelling of the face, lips, or tongue -any new or unusual symptoms -breathing problems -chest pain -leg pain or swelling -vomiting Side effects that usually do not require medical attention (report to your doctor or health care professional if they continue or are bothersome): -back or bone pain -cough, or throat infection -diarrhea or constipation -dizziness -headache -hot flashes -loss of appetite -nausea -sweating -weakness and tiredness -weight gain This list may not describe all possible side effects. Call your doctor for medical advice about side effects. You may report side effects to FDA at 1-800-FDA-1088. Where should I keep my medicine? Keep out of the reach of children. Store at room temperature between 20 and 25 degrees C (68 and 77 degrees F). Throw away any unused medicine after the expiration date. NOTE: This sheet is a summary. It may not cover all possible information. If you have questions about this medicine, talk to your doctor, pharmacist,  or health care provider.  2014, [employer]vier/Gold Standard. (2008-01-10 16:31:52)  Osteoporosis Throughout your life, your body breaks down old bone and replaces it with new bone. As you get older, your body does not replace bone  as quickly as it breaks it down. By the age of 90 years, most people begin to gradually lose bone because of the imbalance between bone loss and replacement. Some people lose more bone than others. Bone loss beyond a specified normal degree is considered osteoporosis.  Osteoporosis affects the strength and durability of your bones. The inside of the ends of your bones and your flat bones, like the bones of your pelvis, look like honeycomb, filled with tiny open spaces. As bone loss occurs, your bones become less dense. This means that the open spaces inside your bones become bigger and the walls between these spaces become thinner. This makes your bones weaker. Bones of a person with osteoporosis can become so weak that they can break (fracture) during minor accidents, such as a simple fall. CAUSES  The following factors have been associated with the development of osteoporosis:  Smoking.  Drinking more than 2 alcoholic drinks several days per week.  Long-term use of certain medicines:  Corticosteroids.  Chemotherapy medicines.  Thyroid medicines.  Antiepileptic medicines.  Gonadal hormone suppression medicine.  Immunosuppression medicine.  Being underweight.  Lack of physical activity.  Lack of exposure to the sun. This can lead to vitamin D deficiency.  Certain medical conditions:  Certain inflammatory bowel diseases, such as Crohn disease and ulcerative colitis.  Diabetes.  Hyperthyroidism.  Hyperparathyroidism. RISK FACTORS Anyone can develop osteoporosis. However, the following factors can increase your risk of developing osteoporosis:  Gender Women are at higher risk than men.  Age Being older than 17 years increases your risk.  Ethnicity White and Asian people have an increased risk.  Weight Being extremely underweight can increase your risk of osteoporosis.  Family history of osteoporosis Having a family member who has developed osteoporosis can increase your  risk. SYMPTOMS  Usually, people with osteoporosis have no symptoms.  DIAGNOSIS  Signs during a physical exam that may prompt your caregiver to suspect osteoporosis include:  Decreased height. This is usually caused by the compression of the bones that form your spine (vertebrae) because they have weakened and become fractured.  A curving or rounding of the upper back (kyphosis). To confirm signs of osteoporosis, your caregiver may request a procedure that uses 2 low-dose X-ray beams with different levels of energy to measure your bone mineral density (dual-energy X-ray absorptiometry [DXA]). Also, your caregiver may check your level of vitamin D. TREATMENT  The goal of osteoporosis treatment is to strengthen bones in order to decrease the risk of bone fractures. There are different types of medicines available to help achieve this goal. Some of these medicines work by slowing the processes of bone loss. Some medicines work by increasing bone density. Treatment also involves making sure that your levels of calcium and vitamin D are adequate. PREVENTION  There are things you can do to help prevent osteoporosis. Adequate intake of calcium and vitamin D can help you achieve optimal bone mineral density. Regular exercise can also help, especially resistance and weight-bearing activities. If you smoke, quitting smoking is an important part of osteoporosis prevention. MAKE SURE YOU:  Understand these instructions.  Will watch your condition.  Will get help right away if you are not doing well or get worse. FOR MORE INFORMATION www.osteo.org and [email or web address].com.ee Document Released: 08/09/2005  Document Revised: 02/24/2013 Document Reviewed: 10/14/2011 [facility] Patient Information 2014 [place].  Breast Cancer Survivor Follow-Up Breast cancer begins when cells in the breast divide too rapidly. The extra cells form a lump (tumor). When the cancer is treated, the goal is to get rid of all cancer  cells. However, sometimes a few cells survive. These cancer cells can then grow. They become recurrent cancer. This means the cancer comes back after treatment.  Most cases of recurrent breast cancer develop 3 to 5 years after treatment. However, sometimes it comes back just a few months after treatment. Other times, it does not come back until years later. If the cancer comes back in the same area as the first breast cancer, it is called a local recurrence. If the cancer comes back somewhere else in the body, it is called regional recurrence if the site is fairly near the breast or distant recurrence if it is far from the breast. Your caregiver may also use the term metastasize to indicate a cancer that has gone to another part of your body. Treatment is still possible after either kind of recurrence. The cancer can still be controlled.  CAUSES OF RECURRENT CANCER No one knows exactly why breast cancer starts in the first place. Why the cancer comes back after treatment is also not clear. It is known that certain conditions, called risk factors, can make this more likely. They include:  Developing breast cancer for the first time before age 44.  Having breast cancer that involves the lymph nodes. These are small, round pieces of tissue found all over the body. Their job is to help fight infections.  Having a large tumor. Cancer is more apt to come back if the first tumor was bigger than 2 inches (5 cm).  Having certain types of breast cancer, such as:  Inflammatory breast cancer. This rare type grows rapidly and causes the breast to become red and swollen.  A high-grade tumor. The grade of a tumor indicates how fast it will grow and spread. High-grade tumors grow more quickly than other types.  HER2 cancer. This refers to the tumor's genetic makeup. Tumors that have this type of gene are more likely to come back after treatment.  Having close tumor margins. This refers to the space between the  tumor and normal, noncancerous cells. If the space is small, the tumor has a greater chance of coming back.  Having treatment involving a surgery to remove the tumor but not the entire breast (lumpectomy) and no radiation therapy. CARE AFTER BREAST CANCER Home Monitoring Women who have had breast cancer should continue to examine their breasts every month. The goal is to catch the cancer quickly if it comes back. Many women find it helpful to do so on the same day each month and to mark the calendar as a reminder. Let your caregiver know immediately if you have any signs of recurrent breast cancer. Symptoms will vary, depending on where the cancer recurs. The original type of treatment can also make a difference. Symptoms of local recurrence after a lumpectomy or a recurrence in the opposite breast may include:  A new lump or thickening in the breast.  A change in the way the skin looks on the breast (such as a rash, dimpling, or wrinkling).  Redness or swelling of the breast.  Changes in the nipple (such as being red, puckered, swollen, or leaking fluid). Symptoms of a recurrence after a breast removal surgery (mastectomy) may include:  A  lump or thickening under the skin.  A thickening around the mastectomy scar. Symptoms of regional recurrence in the lymph nodes near the breast may include:  A lump under the arm or above the collarbone.  Swelling of the arm.  Pain in the arm, shoulder, or chest.  Numbness in the hand or arm. Symptoms of distant recurrence may include:  A cough that does not go away.  Trouble breathing or shortness of breath.  Pain in the bones or the chest. This is pain that lasts or does not respond to rest and medicine.  Headaches.  Sudden vision problems.  Dizziness.  Nausea or vomiting.  Losing weight without trying to.  Persistent abdominal pain.  Changes in bowel movements or blood in the stool.  Yellowing of the skin or eyes  (jaundice).  Blood in the urine or bloody vaginal discharge. Clinical Monitoring  It is helpful to keep a schedule of appointments for needed tests and exams. This includes physical exams, breast exams, exams of the lymph nodes, and general exams.  For the first 3 years after being treated for breast cancer, see your caregiver every 3 to 6 months.  For years 4 and 5 after breast cancer, see your caregiver every 6 to 12 months.  After 5 years, see your caregiver at least once a year.  Regular breast X-rays (mammograms) should continue even if you had a mastectomy.  A mammogram should be done 1 year after the mammogram that first detected breast cancer.  A mammogram should be done every 6 to 12 months after that. Follow your caregiver's advice.  A pelvic exam done by your caregiver checks whether female organs are the normal size and shape. The exam is usually done every year. Ask your caregiver if that schedule is right for you.  Women taking tamoxifen should report any vaginal bleeding immediately to their caregiver. Tamoxifen is often given to women with a certain type of breast cancer. It has been shown to help prevent recurrence.  You will need to decide who your primary caregiver will be.  Most people continue to see their cancer specialist (oncologist) every 3 to 6 months for the first year after cancer treatment.  At some point, you may want to go back to seeing your family caregiver. You would no longer see your oncologist for regular checkups. Many women do this about 1 year after their first diagnosis of breast cancer.  You will still need to be seen every so often by your oncologist. Ask how often that should be. Coordinate this with your family or primary caregiver.  Think about having genetic counseling. This would provide information on traits that can be passed or inherited from one generation to the next. In some cases, breast cancer runs in families. Tell your caregiver  if you:  Are of Ashkenazi Jewish heritage.  Have any family member who has had ovarian cancer.  Have a mother, sister, or daughter who had breast cancer before age 74.  Have 2 or more close relatives who have had breast cancer. This means a mother, sister, daughter, aunt, or grandmother.  Had breast cancer in both breasts.  Have a female relative who has had breast cancer.  Some tests are not recommended for routine screening. Someone recovering from breast cancer does not need to have these tests if there are no problems. The tests have risks, such as radiation exposure, and can be costly. The risks of these tests are thought to be greater than the  benefits:  Blood tests.  Chest X-rays.  Bone scans.  Liver ultrasound.  Computed tomography (CT scan).  Positron emission tomography (PET scan).  Magnetic resonance imaging (MRI scan). DIAGNOSIS OF RECURRENT CANCER Recurrent breast cancer may be suspected for various reasons. A mammogram may not look normal. You might feel a lump or have other symptoms. Your caregiver may find something unusual during an exam. To be sure, your caregiver will probably order some tests. The tests are needed because there are symptoms or hints of a problem. They could include:  Blood tests, including a test to check how well the liver is working. The liver is a common site for a distant cancer recurrence.  Imaging tests that create pictures of the inside of the body. These tests include:  Chest X-rays to show if the cancer has come back in the lungs.  CT scans to create detailed pictures of various areas of the body and help find a distant recurrence.  MRI scans to find anything unusual in the breast, chest, or lymph nodes.  Breast ultrasound tests to examine the breasts.  Bone scans to create a picture of your whole skeleton and find cancer in bony areas.  PET scans to create an image of the whole body. PET scans can be used together with CT  scans to show more detail.  Biopsy. A small sample of tissue is taken and checked under a microscope. If cancer cells are found, they may be tested to see if they contain the HER2 gene or the hormones estrogen and progesterone. This will help your caregiver decide how to treat the recurrent cancer. TREATMENT  How recurrent breast cancer is treated depends on where the new cancer is found. The type of treatment that was used for the first breast cancer makes a difference, too. A combination of treatments may be used. Options include:  Surgery.  If the cancer comes back in the breast that was not treated before, you may need a lumpectomy or mastectomy.  If the cancer comes back in the breast that was treated before, you may need a mastectomy.  The lymph nodes under the arm may need to be removed.  Radiation therapy.  For a local recurrence, radiation may be used if it was not used during the first treatment.  For a distance recurrence, radiation is sometimes used.  Chemotherapy.  This may be used before surgery to treat recurrent breast cancer.  This may be used to treat recurrent cancer that cannot be treated with surgery.  This may be used to treat a distant recurrence.  Hormone therapy.  Women with the HER2 gene may be given hormone therapy to attack this gene. Document Released: 06/28/2011 Document Revised: 01/22/2012 Document Reviewed: 06/28/2011 [facility] Patient Information 2014 [place], [place].

## 2014-02-10 ENCOUNTER — Other Ambulatory Visit: Payer: Self-pay | Admitting: *Deleted

## 2014-02-10 DIAGNOSIS — C50919 Malignant neoplasm of unspecified site of unspecified female breast: Secondary | ICD-10-CM

## 2014-02-10 MED ORDER — GABAPENTIN 300 MG PO CAPS
300.0000 mg | ORAL_CAPSULE | Freq: Every day | ORAL | Status: DC
Start: 2014-02-10 — End: 2016-09-08

## 2014-07-23 ENCOUNTER — Telehealth: Payer: Self-pay | Admitting: *Deleted

## 2014-07-23 ENCOUNTER — Telehealth: Payer: Self-pay | Admitting: Hematology and Oncology

## 2014-07-23 NOTE — Telephone Encounter (Signed)
Marden Noble brought to my attention that the pt has a lab appt on Monday and she is still scheduled with Dr. Humphrey Rolls.  Called pt and left a message for her to return my call so I can reschedule her w/ a new provider.

## 2014-07-23 NOTE — Telephone Encounter (Signed)
, °

## 2014-07-27 ENCOUNTER — Other Ambulatory Visit: Payer: Medicare Other

## 2014-08-03 ENCOUNTER — Ambulatory Visit: Payer: Medicare Other | Admitting: Oncology

## 2014-08-03 ENCOUNTER — Ambulatory Visit
Admission: RE | Admit: 2014-08-03 | Discharge: 2014-08-03 | Disposition: A | Discharge status: Home / Self Care | Payer: Medicare Other | Source: Ambulatory Visit | Attending: Oncology | Admitting: Oncology

## 2014-08-03 DIAGNOSIS — M899 Disorder of bone, unspecified: Secondary | ICD-10-CM | POA: Diagnosis not present

## 2014-08-03 DIAGNOSIS — M8580 Other specified disorders of bone density and structure, unspecified site: Secondary | ICD-10-CM

## 2014-08-03 DIAGNOSIS — M949 Disorder of cartilage, unspecified: Secondary | ICD-10-CM | POA: Diagnosis not present

## 2014-08-03 DIAGNOSIS — Z780 Asymptomatic menopausal state: Secondary | ICD-10-CM | POA: Diagnosis not present

## 2014-08-10 ENCOUNTER — Other Ambulatory Visit: Payer: Self-pay

## 2014-08-10 DIAGNOSIS — C50919 Malignant neoplasm of unspecified site of unspecified female breast: Secondary | ICD-10-CM

## 2014-08-11 ENCOUNTER — Encounter: Payer: Self-pay | Admitting: Hematology and Oncology

## 2014-08-11 ENCOUNTER — Ambulatory Visit (HOSPITAL_BASED_OUTPATIENT_CLINIC_OR_DEPARTMENT_OTHER): Payer: Medicare Other | Admitting: Hematology and Oncology

## 2014-08-11 ENCOUNTER — Other Ambulatory Visit (HOSPITAL_BASED_OUTPATIENT_CLINIC_OR_DEPARTMENT_OTHER): Payer: Medicare Other

## 2014-08-11 VITALS — BP 121/76 | HR 96 | Temp 98.1°F | Resp 18 | Ht 62.0 in | Wt 248.9 lb

## 2014-08-11 DIAGNOSIS — C773 Secondary and unspecified malignant neoplasm of axilla and upper limb lymph nodes: Secondary | ICD-10-CM

## 2014-08-11 DIAGNOSIS — Z170 Estrogen receptor positive status [ER+]: Secondary | ICD-10-CM

## 2014-08-11 DIAGNOSIS — C50919 Malignant neoplasm of unspecified site of unspecified female breast: Secondary | ICD-10-CM

## 2014-08-11 DIAGNOSIS — C50912 Malignant neoplasm of unspecified site of left female breast: Secondary | ICD-10-CM

## 2014-08-11 LAB — COMPREHENSIVE METABOLIC PANEL (CC13)
ALK PHOS: 66 U/L (ref 40–150)
ALT: 21 U/L (ref 0–55)
AST: 13 U/L (ref 5–34)
Albumin: 3.9 g/dL (ref 3.5–5.0)
Anion Gap: 7 mEq/L (ref 3–11)
BILIRUBIN TOTAL: 0.47 mg/dL (ref 0.20–1.20)
BUN: 13.7 mg/dL (ref 7.0–26.0)
CO2: 27 mEq/L (ref 22–29)
Calcium: 9.5 mg/dL (ref 8.4–10.4)
Chloride: 106 mEq/L (ref 98–109)
Creatinine: 0.8 mg/dL (ref 0.6–1.1)
GLUCOSE: 124 mg/dL (ref 70–140)
Potassium: 3.6 mEq/L (ref 3.5–5.1)
SODIUM: 140 meq/L (ref 136–145)
TOTAL PROTEIN: 7 g/dL (ref 6.4–8.3)

## 2014-08-11 LAB — CBC WITH DIFFERENTIAL/PLATELET
BASO%: 0.4 % (ref 0.0–2.0)
Basophils Absolute: 0 10*3/uL (ref 0.0–0.1)
EOS ABS: 0.1 10*3/uL (ref 0.0–0.5)
EOS%: 1.2 % (ref 0.0–7.0)
HCT: 38.8 % (ref 34.8–46.6)
HGB: 13.1 g/dL (ref 11.6–15.9)
LYMPH%: 30.4 % (ref 14.0–49.7)
MCH: 29.9 pg (ref 25.1–34.0)
MCHC: 33.6 g/dL (ref 31.5–36.0)
MCV: 88.9 fL (ref 79.5–101.0)
MONO#: 0.3 10*3/uL (ref 0.1–0.9)
MONO%: 5.3 % (ref 0.0–14.0)
NEUT#: 3.9 10*3/uL (ref 1.5–6.5)
NEUT%: 62.7 % (ref 38.4–76.8)
PLATELETS: 189 10*3/uL (ref 145–400)
RBC: 4.37 10*6/uL (ref 3.70–5.45)
RDW: 13.4 % (ref 11.2–14.5)
WBC: 6.1 10*3/uL (ref 3.9–10.3)
lymph#: 1.9 10*3/uL (ref 0.9–3.3)

## 2014-08-11 NOTE — Assessment & Plan Note (Signed)
1. Left breast inflammatory breast cancer T4d, N3, M0 stage IIIc grade 2 left 21 positive lymph nodes, 5.8 cm tumor ER 95% PR 0% Ki-67 61% HER-2 negative. BRCA 2 mutation underwent salpingo-oophorectomy Status post bilateral mastectomies and radiation to left chest wall and axilla currently on tamoxifen since October 2012. Tolerating it very well without any major problems.  2. surveillance: Clinical exam of the chest wall did not reveal any abnormalities of concern. She does not have any other symptoms that need workup. I recommend watching her every 6 months with a clinical breast exam and followup.  3. survivorship: I encouraged her to do exercise and lose weight. I encouraged her to eat more fruits and vegetables and less red meat. I reviewed her bone density test which showed a decline in the T score from -0.2 down to -1.5 in spite of being on tamoxifen. I encouraged her to take calcium twice a day along with consuming milk. She will need a next bone density in 2 years.

## 2014-08-11 NOTE — Progress Notes (Signed)
Patient Care Team: Betsy Coder, MD as PCP - General (Obstetrics and Gynecology) Thea Silversmith, MD as Consulting Physician (Radiation Oncology) [name], MD as Consulting Physician (Hematology and Oncology)  DIAGNOSIS: Breast cancer, stage 3   Primary site: Breast   Staging method: AJCC 7th Edition   Pathologic: Stage IIIC (T4d, N3, cM0) signed by [name], MD on 01/19/2014  9:51 AM   Summary: Stage IIIC (T4d, N3, cM0)   SUMMARY OF ONCOLOGIC HISTORY:   Breast cancer, stage 3   12/07/2010 - 04/12/2011 Neo-Adjuvant Chemotherapy Neoadjuvant FEC x6 followed by Taxotere x4   12/26/2010 Procedure Genetic testing showed mutation for BRCA2 [id number]   05/12/2011 Surgery Bilateral mastectomy with left axillary dissection 11/21 positive lymph nodes 5.8 cm tumor ER 95% PR 0% Ki-67 61% HER-2 negative ratio 1.39 T3, N3, 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 Because of BRCA2 mutation positivity, she underwent bilateral salpingo-oophorectomy and is currently on tamoxifen since October 2004. She is tolerating it very well without any major problems or concerns. She had a recent bone density test and is here today to discuss the results. She reports no new concerns in terms of breast cancer recurrence. Today she is accompanied by her sister who was tested negative for BRCA mutation. Neither of them have any children. 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  REVIEW OF SYSTEMS:   Constitutional: Denies fevers, chills or abnormal weight loss Eyes: Denies blurriness of vision Ears, nose, mouth, throat, and face: Denies mucositis or sore throat Respiratory: Denies cough, dyspnea or wheezes Cardiovascular: Denies palpitation, chest discomfort or lower extremity swelling Gastrointestinal:  Denies nausea, heartburn or change in bowel habits Skin: Denies abnormal skin rashes Lymphatics: Denies new lymphadenopathy or easy bruising Neurological:Denies numbness, tingling or new weaknesses Behavioral/Psych: Forgetfulness  Breast: Chest wall there are no lumps or nodules there is increased sensitivity to touch All other systems were reviewed with the patient and are negative.  I have reviewed the past medical history, past surgical history, social history and family history with the patient and they are unchanged from previous note.  ALLERGIES:  is allergic to doxycycline hydrochloride.  MEDICATIONS:  Current Outpatient Prescriptions  Medication Sig Dispense Refill  . ALPRAZolam (XANAX) 0.5 MG tablet Take 1 tablet (0.5 mg total) by mouth at bedtime as needed for sleep.  30 tablet  0  . anastrozole (ARIMIDEX) 1 MG tablet Take 1 tablet (1 mg total) by mouth daily.  90 tablet  6  . citalopram (CELEXA) 40 MG tablet Take 1 tablet (40 mg total) by mouth daily.  30 tablet  4  . fluocinonide cream (LIDEX) 9.32 % Apply 1 application topically 2 (two) times daily as needed (for rash.).      Marland Kitchen gabapentin (NEURONTIN) 300 MG capsule Take 1 capsule (300 mg total) by mouth daily.  30 capsule  5  . ibuprofen (ADVIL,MOTRIN) 200 MG tablet Take 400-600 mg by mouth every 8 (eight) hours as needed for pain.      . naproxen  sodium (ANAPROX) 220 MG tablet Take 220 mg by mouth 2 (two) times daily as needed (for muscle aches.).      Marland Kitchen omeprazole (PRILOSEC) 20 MG capsule Take 1 capsule (20 mg total) by mouth daily.  90 capsule  6  . prochlorperazine (COMPAZINE) 10 MG  tablet Take 10 mg by mouth every 6 (six) hours as needed (for nausea.).       Marland Kitchen promethazine (PHENERGAN) 25 MG tablet Take 1 tablet (25 mg total) by mouth every 6 (six) hours as needed for nausea.  12 tablet  0  . [DISCONTINUED] oxybutynin (DITROPAN) 5 MG tablet TAKE ONE TAB TWICE A DAY  60 tablet  1   No current facility-administered medications for this visit.    PHYSICAL EXAMINATION: ECOG PERFORMANCE STATUS: 0 - Asymptomatic  Filed Vitals:   08/11/14 1452  BP: 121/76  Pulse: 96  Temp: 98.1 F (36.7 C)  Resp: 18   Filed Weights   08/11/14 1452  Weight: 248 lb 14.4 oz (112.9 kg)    GENERAL:alert, no distress and comfortable SKIN: skin color, texture, turgor are normal, no rashes or significant lesions EYES: normal, Conjunctiva are pink and non-injected, sclera clear OROPHARYNX:no exudate, no erythema and lips, buccal mucosa, and tongue normal  NECK: supple, thyroid normal size, non-tender, without nodularity LYMPH:  no palpable lymphadenopathy in the cervical, axillary or inguinal LUNGS: clear to auscultation and percussion with normal breathing effort HEART: regular rate & rhythm and no murmurs and no lower extremity edema ABDOMEN:abdomen soft, non-tender and normal bowel sounds Musculoskeletal:no cyanosis of digits and no clubbing  NEURO: alert & oriented x 3 with fluent speech, no focal motor/sensory deficits BREAST: No palpable masses but increased sensitivity to touch along the scars   LABORATORY DATA:  I have reviewed the data as listed   Chemistry      Component Value Date/Time   NA 140 08/11/2014 1438   NA 140 02/13/2013 1812   K 3.6 08/11/2014 1438   K 4.1 02/13/2013 1812   CL 105 02/13/2013 1812   CL 108* 07/30/2012 1430   CO2 27 08/11/2014 1438   CO2 28 01/13/2013 1634   BUN 13.7 08/11/2014 1438   BUN 16 02/13/2013 1812   CREATININE 0.8 08/11/2014 1438   CREATININE 0.70 02/13/2013 1812      Component Value Date/Time   CALCIUM 9.5 08/11/2014 1438   CALCIUM 9.5  01/13/2013 1634   ALKPHOS 66 08/11/2014 1438   ALKPHOS 73 01/13/2013 1634   AST 13 08/11/2014 1438   AST 21 01/13/2013 1634   ALT 21 08/11/2014 1438   ALT 30 01/13/2013 1634   BILITOT 0.47 08/11/2014 1438   BILITOT 0.3 01/13/2013 1634       Lab Results  Component Value Date   WBC 6.1 08/11/2014   HGB 13.1 08/11/2014   HCT 38.8 08/11/2014   MCV 88.9 08/11/2014   PLT 189 08/11/2014   NEUTROABS 3.9 08/11/2014     RADIOGRAPHIC STUDIES: I have personally reviewed the radiology reports and agreed with their findings. Bone density test revealed a T score of -1.5 in the spine  ASSESSMENT & PLAN:  Breast cancer, stage 3 1. Left breast inflammatory breast cancer T4d, N3, M0 stage IIIc grade 2 left 21 positive lymph nodes, 5.8 cm tumor ER 95% PR 0% Ki-67 61% HER-2 negative. BRCA 2 mutation underwent salpingo-oophorectomy Status post bilateral mastectomies and radiation to left chest wall and axilla currently on tamoxifen since October 2012. Tolerating it very well without  any major problems.  2. surveillance: Clinical exam of the chest wall did not reveal any abnormalities of concern. She does not have any other symptoms that need workup. I recommend watching her every 6 months with a clinical breast exam and followup.  3. survivorship: I encouraged her to do exercise and lose weight. I encouraged her to eat more fruits and vegetables and less red meat. I reviewed her bone density test which showed a decline in the T score from -0.2 down to -1.5 in spite of being on tamoxifen. I encouraged her to take calcium twice a day along with consuming milk. She will need a next bone density in 2 years.   No orders of the defined types were placed in this encounter.   The patient has a good understanding of the overall plan. she agrees with it. She will call with any problems that may develop before her next visit here.  I spent 25 minutes counseling the patient face to face. The total time spent in the appointment  was 30 minutes and more than 50% was on counseling and review of test results    Rulon Eisenmenger, MD 08/11/2014 3:28 PM

## 2014-08-12 ENCOUNTER — Telehealth: Payer: Self-pay | Admitting: Hematology and Oncology

## 2014-08-12 NOTE — Telephone Encounter (Signed)
lvm for pt regarding to March 2016 appt.....mailed pt appt sched/avs adn letter

## 2014-08-13 ENCOUNTER — Telehealth: Payer: Self-pay

## 2014-08-13 NOTE — Telephone Encounter (Signed)
Received results of bone density dtd 08/03/14.  Copy to Dr Lindi Adie.  Original to scan.

## 2014-09-01 ENCOUNTER — Telehealth: Payer: Self-pay | Admitting: Hematology and Oncology

## 2014-09-01 NOTE — Telephone Encounter (Signed)
S/w pt advised appt chg from 3/29 (md pal) to 4/21. Also mailed appt calendar.

## 2014-09-11 ENCOUNTER — Encounter: Payer: Self-pay | Admitting: Nurse Practitioner

## 2014-09-11 ENCOUNTER — Ambulatory Visit (HOSPITAL_BASED_OUTPATIENT_CLINIC_OR_DEPARTMENT_OTHER): Payer: Medicare Other | Admitting: Nurse Practitioner

## 2014-09-11 ENCOUNTER — Telehealth: Payer: Self-pay | Admitting: *Deleted

## 2014-09-11 VITALS — BP 127/84 | HR 75 | Temp 98.4°F | Resp 18 | Ht 62.0 in | Wt 249.8 lb

## 2014-09-11 DIAGNOSIS — C50919 Malignant neoplasm of unspecified site of unspecified female breast: Secondary | ICD-10-CM | POA: Diagnosis not present

## 2014-09-11 DIAGNOSIS — I890 Lymphedema, not elsewhere classified: Secondary | ICD-10-CM | POA: Insufficient documentation

## 2014-09-11 DIAGNOSIS — C50912 Malignant neoplasm of unspecified site of left female breast: Secondary | ICD-10-CM

## 2014-09-11 NOTE — Assessment & Plan Note (Signed)
Does appear that patient has some moderate left upper extremity lymphedema now.  There is no erythema, no warmth, no tenderness with palpation or movement, and no red streaks noted on exam.  Have ordered a lymphedema clinic referral for lymphedema massage instruction and a left upper extremity compression sleeve as well.  Also advised patient that she could try some ibuprofen on appearing basis.  Advised patient to call or go directed to the emergency department over the weekend she develops any new or worsening symptoms whatsoever.  Patient stated understanding of all.

## 2014-09-11 NOTE — Progress Notes (Signed)
SYMPTOM MANAGEMENT CLINIC   HPI: Tina Vaughan 53 y.o. female diagnosed with breast cancer.  Patient is status post bilateral mastectomy in June 2012.  She completed radiation therapy in October 2012.  Currently undergoing anastrozole therapy.  Patient called the cancer Center today requesting urgent care visit.  She is complaining of new onset left upper cavity edema for the past few days.  She denies any known injury or trauma to this arm.  She denies any erythema, warmth, tenderness, or red streaks.  She denies any fever or chills.  Patient states that she is a never had lymphedema to this arm in the past.   HPI   ROS  Past Medical History  Diagnosis Date  . Frequent urination   . Depression   . Sleep apnea   . Eczema   . Neuromuscular disorder   . GERD (gastroesophageal reflux disease)   . 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 Breast cancer, stage 3 2012    left    Past Surgical History  Procedure Laterality Date  . Breast surgery  05/12/11    total mastectomy on right, radical mastectomy on left  . Portacath placement    . Laparoscopy  03/14/2012    Procedure: LAPAROSCOPY OPERATIVE;  Surgeon: Osborne Oman, MD;  Location: [facility];  Service: Gynecology;  Laterality: N/A;  . Salpingoophorectomy  03/14/2012    Procedure: SALPINGO OOPHERECTOMY;  Surgeon: Osborne Oman, MD;  Location: [facility];  Service: Gynecology;  Laterality: Bilateral;  . Port-a-cath removal  04/19/2012    Procedure: MINOR REMOVAL PORT-A-CATH;  Surgeon: Haywood Lasso, MD;  Location: [facility];  Service: General;  Laterality: N/A;    has Breast cancer, stage 3; Status post prophylactic bilateral salpingo-oophorectomy for BRCA; and Lymphedema of upper extremity on her problem list.     is allergic to doxycycline hydrochloride.    Medication List       This list is accurate as of: 09/11/14  5:00 PM.  Always use your most recent med list.               ALPRAZolam  0.5 MG tablet  Commonly known as:  XANAX  Take 1 tablet (0.5 mg total) by mouth at bedtime as needed for sleep.     anastrozole 1 MG tablet  Commonly known as:  ARIMIDEX  Take 1 tablet (1 mg total) by mouth daily.     Calcium 200 MG Tabs  Take by mouth.     cholecalciferol 1000 UNITS tablet  Commonly known as:  VITAMIN D  Take 4,000 Units by mouth daily.     citalopram 40 MG tablet  Commonly known as:  CELEXA  Take 1 tablet (40 mg total) by mouth daily.     fluocinonide cream 0.05 %  Commonly known as:  LIDEX  Apply 1 application topically 2 (two) times daily as needed (for rash.).     gabapentin 300 MG capsule  Commonly known as:  NEURONTIN  Take 1 capsule (300 mg total) by mouth daily.     ibuprofen 200 MG tablet  Commonly known as:  ADVIL,MOTRIN  Take 400-600 mg by mouth every 8 (eight) hours as needed for pain.     naproxen sodium 220 MG tablet  Commonly known as:  ANAPROX  Take 220 mg by mouth 2 (two) times daily as needed (for muscle aches.).     omeprazole 20 MG capsule  Commonly  known as:  PRILOSEC  Take 1 capsule (20 mg total) by mouth daily.     prochlorperazine 10 MG tablet  Commonly known as:  COMPAZINE  Take 10 mg by mouth every 6 (six) hours as needed (for nausea.).     promethazine 25 MG tablet  Commonly known as:  PHENERGAN  Take 1 tablet (25 mg total) by mouth every 6 (six) hours as needed for nausea.         PHYSICAL EXAMINATION  Blood pressure 127/84, pulse 75, temperature 98.4 F (36.9 C), temperature source Oral, resp. rate 18, height $RemoveBe'5\' 2"'byroePEjm$  (1.575 m), weight 249 lb 12.8 oz (113.309 kg).  Physical Exam  Nursing note and vitals reviewed. Constitutional: She is oriented to person, place, and time and well-developed, well-nourished, and in no distress.  HENT:  Head: Normocephalic.  Eyes: Conjunctivae and EOM are normal. Pupils are equal, round, and reactive to light. No scleral icterus.  Neck: Normal range of motion. No JVD present.    Pulmonary/Chest: Effort normal. No respiratory distress.  Musculoskeletal: Normal range of motion. She exhibits edema. She exhibits no tenderness.  Patient has lymphedema to her entire left upper extremity.  There is no erythema, no warmth, no tenderness, or red streaks noted on exam.  Patient is observed with full range of motion with all extremities; and all pulses are palpable.  Neurological: She is alert and oriented to person, place, and time. Gait normal.  Skin: Skin is warm and dry. No rash noted. No erythema.  Psychiatric: Affect normal.    LABORATORY DATA:. No visits with results within 3 Day(s) from this visit. Latest known visit with results is:  Appointment on 08/11/2014  Component Date Value Ref Range Status  . WBC 08/11/2014 6.1  3.9 - 10.3 10e3/uL Final  . NEUT# 08/11/2014 3.9  1.5 - 6.5 10e3/uL Final  . HGB 08/11/2014 13.1  11.6 - 15.9 g/dL Final  . HCT 08/11/2014 38.8  34.8 - 46.6 % Final  . Platelets 08/11/2014 189  145 - 400 10e3/uL Final  . MCV 08/11/2014 88.9  79.5 - 101.0 fL Final  . MCH 08/11/2014 29.9  25.1 - 34.0 pg Final  . MCHC 08/11/2014 33.6  31.5 - 36.0 g/dL Final  . RBC 08/11/2014 4.37  3.70 - 5.45 10e6/uL Final  . RDW 08/11/2014 13.4  11.2 - 14.5 % Final  . lymph# 08/11/2014 1.9  0.9 - 3.3 10e3/uL Final  . MONO# 08/11/2014 0.3  0.1 - 0.9 10e3/uL Final  . Eosinophils Absolute 08/11/2014 0.1  0.0 - 0.5 10e3/uL Final  . Basophils Absolute 08/11/2014 0.0  0.0 - 0.1 10e3/uL Final  . NEUT% 08/11/2014 62.7  38.4 - 76.8 % Final  . LYMPH% 08/11/2014 30.4  14.0 - 49.7 % Final  . MONO% 08/11/2014 5.3  0.0 - 14.0 % Final  . EOS% 08/11/2014 1.2  0.0 - 7.0 % Final  . BASO% 08/11/2014 0.4  0.0 - 2.0 % Final  . Sodium 08/11/2014 140  136 - 145 mEq/L Final  . Potassium 08/11/2014 3.6  3.5 - 5.1 mEq/L Final  . Chloride 08/11/2014 106  98 - 109 mEq/L Final  . CO2 08/11/2014 27  22 - 29 mEq/L Final  . Glucose 08/11/2014 124  70 - 140 mg/dl Final  . BUN 08/11/2014  13.7  7.0 - 26.0 mg/dL Final  . Creatinine 08/11/2014 0.8  0.6 - 1.1 mg/dL Final  . Total Bilirubin 08/11/2014 0.47  0.20 - 1.20 mg/dL Final  . Alkaline Phosphatase  08/11/2014 66  40 - 150 U/L Final  . AST 08/11/2014 13  5 - 34 U/L Final  . ALT 08/11/2014 21  0 - 55 U/L Final  . Total Protein 08/11/2014 7.0  6.4 - 8.3 g/dL Final  . Albumin 08/11/2014 3.9  3.5 - 5.0 g/dL Final  . Calcium 08/11/2014 9.5  8.4 - 10.4 mg/dL Final  . Anion Gap 08/11/2014 7  3 - 11 mEq/L Final     RADIOGRAPHIC STUDIES: No results found.  ASSESSMENT/PLAN:    Breast cancer, stage 3  Assessment & Plan Patient is status post bilateral mastectomy June 2012.  She completed radiation therapy in October 2012.  She initially underwent tamoxifen therapy; but was later switched to anastrozole in September 2013.  She continues to tolerate the anastrozole fairly well; with minimal side effect complaints.  She is scheduled for her next followup care the [facility] on 03/04/2015.   Lymphedema of upper extremity  Assessment & Plan Does appear that patient has some moderate left upper extremity lymphedema now.  There is no erythema, no warmth, no tenderness with palpation or movement, and no red streaks noted on exam.  Have ordered a lymphedema clinic referral for lymphedema massage instruction and a left upper extremity compression sleeve as well.  Also advised patient that she could try some ibuprofen on appearing basis.  Advised patient to call or go directed to the emergency department over the weekend she develops any new or worsening symptoms whatsoever.  Patient stated understanding of all.   Patient stated understanding of all instructions; and was in agreement with this plan of care. The patient knows to call the clinic with any problems, questions or concerns.   Review/collaboration with Dr. Lindi Adie and Dr. Pablo Ledger regarding all aspects of patient's visit today.   Total time spent with patient was 25 minutes;   with greater than 75 percent of that time spent in face to face counseling regarding her symptoms, and coordination of care and follow up.  Disclaimer: This note was dictated with voice recognition software. Similar sounding words can inadvertently be transcribed and may not be corrected upon review.   Drue Second, NP 09/11/2014

## 2014-09-11 NOTE — Telephone Encounter (Signed)
Patient called stating that her lower left arm has been swollen for 3 days. States that it feels tight and hurts on the inside of the arm. States that it just "aches". Denies any warmth or redness to arm, denies fever. Discussed with MD and appt made with Cyndee Bacon,NP. Patient verbalized understanding.

## 2014-09-11 NOTE — Assessment & Plan Note (Signed)
Patient is status post bilateral mastectomy June 2012.  She completed radiation therapy in October 2012.  She initially underwent tamoxifen therapy; but was later switched to anastrozole in September 2013.  She continues to tolerate the anastrozole fairly well; with minimal side effect complaints.  She is scheduled for her next followup care the [facility] on 03/04/2015.

## 2014-09-14 ENCOUNTER — Telehealth: Payer: Self-pay | Admitting: *Deleted

## 2014-09-14 ENCOUNTER — Encounter: Payer: Self-pay | Admitting: Nurse Practitioner

## 2014-09-14 ENCOUNTER — Other Ambulatory Visit: Payer: Self-pay | Admitting: *Deleted

## 2014-09-14 DIAGNOSIS — C50919 Malignant neoplasm of unspecified site of unspecified female breast: Secondary | ICD-10-CM

## 2014-09-14 MED ORDER — UNABLE TO FIND: Status: DC

## 2014-09-14 NOTE — Telephone Encounter (Signed)
Patient called wanting to know if she should get a compression sleeve now as she can not be seen in the lymphedema clinic until Nov 17th. Called patient and told her that she can come pick up prescription for compression sleeve at her convenience. Instructed patient to obtain sleeve from medical supply so that they can measure her for a proper fit. Patient verbalized understanding.

## 2014-09-14 NOTE — Telephone Encounter (Signed)
Collaborative nurse has already spoken with patient about edema lymphedema appointment and sleeve earlier today.

## 2014-09-15 NOTE — Progress Notes (Signed)
Does she need to be referred to PT?

## 2014-09-18 ENCOUNTER — Telehealth: Payer: Self-pay

## 2014-09-18 NOTE — Telephone Encounter (Signed)
Returned pt call re: swelling of hand with new compression sleeve.  Pt reports she was measured and fitted at [facility].  Let pt know she needs to go back to [facility] voiced understanding.

## 2014-09-29 ENCOUNTER — Other Ambulatory Visit: Payer: Self-pay | Admitting: Oncology

## 2014-09-29 ENCOUNTER — Ambulatory Visit: Payer: Medicare Other | Attending: Hematology and Oncology | Admitting: Physical Therapy

## 2014-09-29 DIAGNOSIS — Z5189 Encounter for other specified aftercare: Secondary | ICD-10-CM | POA: Diagnosis not present

## 2014-09-29 DIAGNOSIS — I890 Lymphedema, not elsewhere classified: Secondary | ICD-10-CM | POA: Diagnosis not present

## 2014-09-29 DIAGNOSIS — C50919 Malignant neoplasm of unspecified site of unspecified female breast: Secondary | ICD-10-CM | POA: Diagnosis not present

## 2014-09-29 DIAGNOSIS — C50912 Malignant neoplasm of unspecified site of left female breast: Secondary | ICD-10-CM

## 2014-09-29 DIAGNOSIS — R5381 Other malaise: Secondary | ICD-10-CM | POA: Diagnosis not present

## 2014-09-29 DIAGNOSIS — E8989 Other postprocedural endocrine and metabolic complications and disorders: Secondary | ICD-10-CM | POA: Insufficient documentation

## 2014-09-29 NOTE — Therapy (Signed)
Physical Therapy Evaluation  Patient Details  Name: Tina Vaughan MRN: [id number] Date of Birth: 13-Jul-1961  Encounter Date: 09/29/2014      PT End of Session - 09/29/14 1510    Visit Number 1   Number of Visits 8   Date for PT Re-Evaluation 11/12/14   PT Start Time 4854   PT Stop Time 1445   PT Time Calculation (min) 60 min   Activity Tolerance Patient tolerated treatment well   Behavior During Therapy [facility] for tasks assessed/performed      Past Medical History  Diagnosis Date  . Frequent urination   . Depression   . Sleep apnea   . Eczema   . Neuromuscular disorder   . GERD (gastroesophageal reflux disease)   . 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 Breast cancer, stage 3 2012    left    Past Surgical History  Procedure Laterality Date  . Breast surgery  05/12/11    total mastectomy on right, radical mastectomy on left  . Portacath placement    . Laparoscopy  03/14/2012    Procedure: LAPAROSCOPY OPERATIVE;  Surgeon: Osborne Oman, MD;  Location: [facility];  Service: Gynecology;  Laterality: N/A;  . Salpingoophorectomy  03/14/2012    Procedure: SALPINGO OOPHERECTOMY;  Surgeon: Osborne Oman, MD;  Location: [facility];  Service: Gynecology;  Laterality: Bilateral;  . Port-a-cath removal  04/19/2012    Procedure: MINOR REMOVAL PORT-A-CATH;  Surgeon: Haywood Lasso, MD;  Location: [facility];  Service: General;  Laterality: N/A;    There were no vitals taken for this visit.  Visit Diagnosis:  Lymphedema of upper extremity following lymphadenectomy - Plan: PT plan of care cert/re-cert  Physical deconditioning - Plan: PT plan of care cert/re-cert      Subjective Assessment - 09/29/14 1456    Symptoms pt comes to therapy today for treatment of new onset of lymphedema in her left arm   Patient Stated Goals to get her lymphedema   Currently in Pain? Yes   Pain Score 2    Pain Location Arm   Pain Orientation Left   Pain Descriptors /  Indicators Aching   Pain Frequency Constant   Pain Relieving Factors elevating arm          [facility] PT Assessment - 09/29/14 1505    Assessment   Medical Diagnosis lymphedema   Onset Date 09/01/14   Precautions   Precautions Other (comment)   Precaution Comments cancer with chemo and radiation   Restrictions   Weight Bearing Restrictions No   Balance Screen   Has the patient fallen in the past 6 months No   Has the patient had a decrease in activity level because of a fear of falling?  No   Is the patient reluctant to leave their home because of a fear of falling?  No   Home Environment   Living Enviornment Private residence   Living Arrangements Alone   Available Help at Discharge Friend(s)   Prior Function   Level of Independence Independent with basic ADLs;Independent with gait;Independent with homemaking with ambulation   Cognition   Overall Cognitive Status Within Functional Limits for tasks assessed   Observation/Other Assessments   Observations pt with well healed scars with decreased mobility especially at left mastectomy site                  Plan - 09/29/14 1511    Clinical Impression  Statement Ms. Abate will beneift from complete decongestive therapy to learn how to and manage the lymphedema in her arm.  She will also benefit from strength training program to increase upper body strength to help decrease future lymphedema flare ups  she was given introduction to manual lymph drainage technque and given the Klose DVD  to begn self treatment at home since her next appointment will not be til Nov. 30   Pt will benefit from skilled therapeutic intervention in order to improve on the following deficits Increased edema;Decreased scar mobility;Postural dysfunction;Decreased strength   Rehab Potential Good   PT Frequency 2x / week   PT Duration 4 weeks   PT Treatment/Interventions Therapeutic exercise;Compression bandaging;Scar mobilization;Manual lymph  drainage;Patient/family education   PT Next Visit Plan manul lymph drainage,  If appointment is on a Thursday or Friday and pt will not have to use her arms at school, the next day,  consider compression bandaging for more aggressive reductionin fluid   Consulted and Agree with Plan of Care Patient          G-Codes - 10-27-2014 1746    Functional Assessment Tool Used clinical judgement   Functional Limitation Other PT primary   Other PT Primary Current Status (I9518) At least 20 percent but less than 40 percent impaired, limited or restricted   Other PT Primary Goal Status (A4166) At least 1 percent but less than 20 percent impaired, limited or restricted      Problem List Patient Active Problem List   Diagnosis Date Noted  . Lymphedema of upper extremity 09/11/2014  . Status post prophylactic bilateral salpingo-oophorectomy for BRCA 03/19/2012  . Breast cancer, stage 3 05/18/2011            LYMPHEDEMA/ONCOLOGY QUESTIONNAIRE - October 27, 2014 1355    Type   Cancer Type breast cancer   Surgeries   Mastectomy Date 05/13/11  bilateral   Number Lymph Nodes Removed 29  from left, sentinel nodes on right   Date Lymphedema/Swelling Started   Date 09/01/14  recently got compression sleeve and glove from [facility]   Treatment   Active Chemotherapy Treatment No   Past Chemotherapy Treatment Yes   Date 11/29/10   Active Radiation Treatment No   Past Radiation Treatment Yes   Date 06/30/11   Body Site left axilla and chest   Current Hormone Treatment No   Past Hormone Therapy No   What other symptoms do you have   Are you Having Heaviness or Tightness Yes   Are you having Pain Yes   Are you having pitting edema Yes  not so much now   Body Site left medial arm   Is it Hard or Difficult finding clothes that fit No   Do you have infections No   Is there Decreased scar mobility Yes   Stemmer Sign No   Other Symptoms [employer] LIight 20-30 wears most of the time during the day    Right Upper Extremity Lymphedema   10 cm Proximal to Olecranon Process 39 cm   Olecranon Process 28.4 cm   15 cm Proximal to Ulnar Styloid Process 28.8 cm   10 cm Proximal to Ulnar Styloid Process 25.5 cm   Just Proximal to Ulnar Styloid Process 17 cm   Across Hand at [employer] 20.4 cm   At [place] of 2nd Digit 6.8 cm   Left Upper Extremity Lymphedema   15 cm Proximal to Olecranon Process 41 cm   10 cm Proximal to  Olecranon Process 39.2 cm   Olecranon Process 29.9 cm   15 cm Proximal to Ulnar Styloid Process 30.8 cm   10 cm Proximal to Ulnar Styloid Process 27.5 cm   Just Proximal to Ulnar Styloid Process 17.3 cm   Across Hand at [employer] 20.5 cm   At [place] of 2nd Digit 6.5 cm             Quick Dash - 09/29/14 0001    Open a tight or new jar Moderate difficulty   Do heavy household chores (wash walls, wash floors) Moderate difficulty   Carry a shopping bag or briefcase Moderate difficulty   Wash your back Moderate difficulty   Use a knife to cut food Mild difficulty   Recreational activities in which you take some force or impact through your arm, shoulder, or hand (golf, hammering, tennis) Moderate difficulty   During the past week, to what extent has your arm, shoulder or hand problem interfered with your normal social activities with family, friends, neighbors, or groups? Slightly   During the past week, to what extent has your arm, shoulder or hand problem limited your work or other regular daily activities Slightly   Arm, shoulder, or hand pain. Moderate   Tingling (pins and needles) in your arm, shoulder, or hand Moderate   Difficulty Sleeping Mild difficulty   DASH Score 40.91 %               Short Term Clinic Goals - 09/29/14 1742    CC Short Term Goal  #1   Title short term goals = long term goals          Long Term Clinic Goals - 09/29/14 1758    CC Long Term Goal  #1   Title pt will have an understanding of lymphedema risk reduction  practices   Period Weeks   Status New   CC Long Term Goal  #2   Title pt will be able to perform self manual lymph drainage and use compression for self management of lymphedema in  her left upper extremity   Time 4   Period Weeks   Status New   CC Long Term Goal  #3   Title pt will have reducition of circumferential measurement of left arm by  1.0 cm at 10 cm above the ulnar styloid   Time 4   Period Weeks   Status New   CC Long Term Goal  #4   Title pt will verbalize understanding of a strength exercise program for her upper extremitites   CC Long Term Goal  #5   Title Patient will have a reduction in Quick Dash score by 5 points   Time 4   Period Weeks   Status New         Dannika Hilgeman K. Owens Shark, PT  09/29/2014, 5:59 PM

## 2014-10-12 ENCOUNTER — Encounter: Payer: Self-pay | Admitting: Physical Therapy

## 2014-10-12 ENCOUNTER — Ambulatory Visit: Payer: Medicare Other | Admitting: Physical Therapy

## 2014-10-12 ENCOUNTER — Telehealth: Payer: Self-pay

## 2014-10-12 VITALS — BP 128/90

## 2014-10-12 DIAGNOSIS — I972 Postmastectomy lymphedema syndrome: Secondary | ICD-10-CM

## 2014-10-12 DIAGNOSIS — C50919 Malignant neoplasm of unspecified site of unspecified female breast: Secondary | ICD-10-CM | POA: Diagnosis not present

## 2014-10-12 DIAGNOSIS — R5381 Other malaise: Secondary | ICD-10-CM | POA: Diagnosis not present

## 2014-10-12 DIAGNOSIS — I890 Lymphedema, not elsewhere classified: Secondary | ICD-10-CM | POA: Diagnosis not present

## 2014-10-12 DIAGNOSIS — Z5189 Encounter for other specified aftercare: Secondary | ICD-10-CM | POA: Diagnosis not present

## 2014-10-12 DIAGNOSIS — E8989 Other postprocedural endocrine and metabolic complications and disorders: Secondary | ICD-10-CM | POA: Diagnosis not present

## 2014-10-12 NOTE — Therapy (Signed)
Physical Therapy Treatment  Patient Details  Name: Tina Vaughan MRN: [id number] Date of Birth: 1961-05-10  Encounter Date: 10/12/2014      PT End of Session - 10/12/14 1515    Visit Number 2   Number of Visits 8   Date for PT Re-Evaluation 11/12/14   PT Start Time 1430   PT Stop Time 1515   PT Time Calculation (min) 45 min      Past Medical History  Diagnosis Date  . Frequent urination   . Depression   . Sleep apnea   . Eczema   . Neuromuscular disorder   . GERD (gastroesophageal reflux disease)   . 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 Breast cancer, stage 3 2012    left    Past Surgical History  Procedure Laterality Date  . Breast surgery  05/12/11    total mastectomy on right, radical mastectomy on left  . Portacath placement    . Laparoscopy  03/14/2012    Procedure: LAPAROSCOPY OPERATIVE;  Surgeon: Tereso Newcomer, MD;  Location: [facility];  Service: Gynecology;  Laterality: N/A;  . Salpingoophorectomy  03/14/2012    Procedure: SALPINGO OOPHERECTOMY;  Surgeon: Tereso Newcomer, MD;  Location: [facility];  Service: Gynecology;  Laterality: Bilateral;  . Port-a-cath removal  04/19/2012    Procedure: MINOR REMOVAL PORT-A-CATH;  Surgeon: Currie Paris, MD;  Location: [facility];  Service: General;  Laterality: N/A;    BP 128/90 mmHg  Visit Diagnosis:  Post-mastectomy lymphedema syndrome      Subjective Assessment - 10/12/14 1434    Symptoms I don't feel good today.  I feel kind of dizzy and kind of in a fog.  I called the doctor and am waiting for a call back.   Currently in Pain? No/denies            [facility] Adult PT Treatment/Exercise - 10/12/14 0001    Manual Therapy   Manual Therapy Manual Lymphatic Drainage (MLD);Compression Bandaging   Compression Bandaging Bandaged elft upper extremity: lotion applied to left arm. Stockinette applied to entire left arm.  Artiflex applied to left hand and arm to axilla.  Elastomull applied to  all 5 fingers.  4 Comprilan short stretch compression bandages applied from hand to axilla.    Manual lymph drainage in supine as follows: short neck, right axillary nodes, left inguinal nodes, superficial and deep abdominals;  anterior inter-axillary anastamoses, left axillo-inguinal anastamoses. Left upper extremity from fingers and dorsal hand to lateral  shoulder redirecting along pathways.      PT Education - 10/12/14 1516    Education provided Yes   Education Details Instructed patient to move left UE as much as possible; explained how her muscles work as a pump and lymphedema responds well to active use of arm while bandaged.   Person(s) Educated Patient   Methods Explanation   Comprehension Verbalized understanding              Plan - 10/12/14 1632    Clinical Impression Statement Patient tolerated manual lymph drainage and compression bandaging without complaint.  She did not appear to have a significant amount of swelling today and reported that her swelling has reduced some over the past week but that she wanted to go ahead with bandaging to try to reduce her arm to its previous size.     Pt will benefit from skilled therapeutic intervention in order to improve on the following  deficits Increased edema   Rehab Potential Excellent   PT Frequency 2x / week   PT Duration 4 weeks   PT Treatment/Interventions Therapeutic exercise;Compression bandaging;Scar mobilization;Manual lymph drainage;Patient/family education   PT Next Visit Plan Teach patient's sister to perform compression bandaging or instruct patient if her sister isn't present.  Continue manual lymph drainage and remeasure.   Consulted and Agree with Plan of Care Patient        Problem List Patient Active Problem List   Diagnosis Date Noted  . Lymphedema of upper extremity 09/11/2014  . Status post prophylactic bilateral salpingo-oophorectomy for BRCA 03/19/2012  . Breast cancer, stage 3 05/18/2011        Clide Remmers,MARTI COOPER, PT 10/12/2014, 4:35 PM

## 2014-10-12 NOTE — Telephone Encounter (Signed)
Returned pt call re: Dizzy for last 3 days.  Pt reports no other symptoms.  No recent medication changes, patient drinking adequately, denies falls.  Patient reports small cold a few weeks ago.  Recommended pt see PCP and if PCP feels she needs further studies or to be seen by [name], we will be happy to bring her in for appt with MD.Pt voiced understanding.

## 2014-10-13 ENCOUNTER — Emergency Department (INDEPENDENT_AMBULATORY_CARE_PROVIDER_SITE_OTHER)
Admission: EM | Admit: 2014-10-13 | Discharge: 2014-10-13 | Disposition: A | Discharge status: Home / Self Care | Payer: Medicare Other | Source: Home / Self Care | Attending: Family Medicine | Admitting: Family Medicine

## 2014-10-13 ENCOUNTER — Encounter (HOSPITAL_COMMUNITY): Payer: Self-pay | Admitting: Family Medicine

## 2014-10-13 DIAGNOSIS — R42 Dizziness and giddiness: Secondary | ICD-10-CM | POA: Diagnosis not present

## 2014-10-13 DIAGNOSIS — R519 Headache, unspecified: Secondary | ICD-10-CM

## 2014-10-13 DIAGNOSIS — R51 Headache: Secondary | ICD-10-CM | POA: Diagnosis not present

## 2014-10-13 HISTORY — DX: Lymphedema, not elsewhere classified: I89.0

## 2014-10-13 MED ORDER — SUMATRIPTAN SUCCINATE 6 MG/0.5ML ~~LOC~~ SOLN
SUBCUTANEOUS | Status: AC
  Filled 2014-10-13: qty 0.5

## 2014-10-13 MED ORDER — ONDANSETRON 4 MG PO TBDP
4.0000 mg | ORAL_TABLET | Freq: Once | ORAL | Status: AC
  Administered 2014-10-13: 4 mg via ORAL

## 2014-10-13 MED ORDER — SUMATRIPTAN SUCCINATE 6 MG/0.5ML ~~LOC~~ SOLN
6.0000 mg | Freq: Once | SUBCUTANEOUS | Status: AC
  Administered 2014-10-13: 6 mg via SUBCUTANEOUS

## 2014-10-13 MED ORDER — ONDANSETRON HCL 4 MG PO TABS: 4.0000 mg | ORAL_TABLET | Freq: Three times a day (TID) | ORAL | Status: DC | PRN

## 2014-10-13 MED ORDER — DEXAMETHASONE SODIUM PHOSPHATE 10 MG/ML IJ SOLN
10.0000 mg | Freq: Once | INTRAMUSCULAR | Status: AC
  Administered 2014-10-13: 10 mg via INTRAMUSCULAR

## 2014-10-13 MED ORDER — DEXAMETHASONE SODIUM PHOSPHATE 10 MG/ML IJ SOLN
INTRAMUSCULAR | Status: AC
Start: 2014-10-13 — End: 2014-10-13
  Filled 2014-10-13: qty 1

## 2014-10-13 MED ORDER — ONDANSETRON 4 MG PO TBDP
ORAL_TABLET | ORAL | Status: AC
  Filled 2014-10-13: qty 1

## 2014-10-13 NOTE — ED Notes (Signed)
Feels better since lying down

## 2014-10-13 NOTE — ED Notes (Signed)
C/o dizziness and lightheadedness for 4 days. The first day symptoms were mild, day 2 symptoms worsened and have maintained at that level days 2-4.  Denies denies cough, cold or runny nose. No nausea, vomiting or diarrhea.  Does have a headache left side of head.  Patient has a wrapping to left arm for lymphedema.

## 2014-10-13 NOTE — ED Notes (Signed)
Called in zofran (generic) to [facility] as patient requested [phone]

## 2014-10-13 NOTE — ED Notes (Signed)
Patient called this nurse to the room for "not feeling well". " Feeling hot, nauseated, like passing out".  Assisted patient to stretcher, patient lying flat.  Dr Marily Memos notified and brought patient's sister to bedside as requested by patient

## 2014-10-13 NOTE — ED Provider Notes (Signed)
CSN: [id number]     Arrival date & time 10/13/14  8563 History   First MD Initiated Contact with Patient 10/13/14 1001     Chief Complaint  Patient presents with  . Dizziness   (Consider location/radiation/quality/duration/timing/severity/associated sxs/prior Treatment) HPI  Dizzy episodes: started 4 days ago. Described as feeling woozy or lightheaded. Last only a few seconds to a few hours. 3-4 episodes per day. Occasionally comes on w/ going from sitting or laying to standing but other times they occur at random. Deniees fevers, CP, SOB, palpitations, syncope, cough congestion, or ear fullness. HA started on Saturday. Advil 400 w/ HA but not other symptoms. No recent changes in medications.    Past Medical History  Diagnosis Date  . Frequent urination   . Depression   . Sleep apnea   . Eczema   . Neuromuscular disorder   . GERD (gastroesophageal reflux disease)   . 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 Breast cancer, stage 3 2012    left  . Lymphedema    Past Surgical History  Procedure Laterality Date  . Breast surgery  05/12/11    total mastectomy on right, radical mastectomy on left  . Portacath placement    . Laparoscopy  03/14/2012    Procedure: LAPAROSCOPY OPERATIVE;  Surgeon: Osborne Oman, MD;  Location: [facility];  Service: Gynecology;  Laterality: N/A;  . Salpingoophorectomy  03/14/2012    Procedure: SALPINGO OOPHERECTOMY;  Surgeon: Osborne Oman, MD;  Location: [facility];  Service: Gynecology;  Laterality: Bilateral;  . Port-a-cath removal  04/19/2012    Procedure: MINOR REMOVAL PORT-A-CATH;  Surgeon: Haywood Lasso, MD;  Location: [facility];  Service: General;  Laterality: N/A;   Family History  Problem Relation Age of Onset  . Cancer Mother     Breast cancer and pancreatic cancer x 2  . Stroke Father   . Hypertension Father    History  Substance Use Topics  . Smoking status: Never Smoker   . Smokeless tobacco: Never Used  .  Alcohol Use: Yes     Comment: social   OB History    Gravida Para Term Preterm AB TAB SAB Ectopic Multiple Living   1    1 1          Review of Systems Per HPI with all other pertinent systems negative.   Allergies  Doxycycline hydrochloride  Home Medications   Prior to Admission medications   Medication Sig Start Date End Date Taking? Authorizing Provider  ALPRAZolam Duanne Moron) 0.5 MG tablet Take 1 tablet (0.5 mg total) by mouth at bedtime as needed for sleep. 12/10/13   [name], MD  anastrozole (ARIMIDEX) 1 MG tablet Take 1 tablet (1 mg total) by mouth daily. 07/22/13   [name], MD  Calcium 200 MG TABS Take by mouth.    Historical Provider, MD  cholecalciferol (VITAMIN D) 1000 UNITS tablet Take 4,000 Units by mouth daily.    Historical Provider, MD  citalopram (CELEXA) 40 MG tablet TAKE 1 TABLET BY MOUTH DAILY. 09/30/14   Rulon Eisenmenger, MD  fluocinonide cream (LIDEX) 1.49 % Apply 1 application topically 2 (two) times daily as needed (for rash.).    Historical Provider, MD  gabapentin (NEURONTIN) 300 MG capsule Take 1 capsule (300 mg total) by mouth daily. 02/10/14   [name], MD  ibuprofen (ADVIL,MOTRIN) 200 MG tablet Take 400-600 mg by mouth every 8 (eight) hours as  needed for pain.    Historical Provider, MD  naproxen sodium (ANAPROX) 220 MG tablet Take 220 mg by mouth 2 (two) times daily as needed (for muscle aches.).    Historical Provider, MD  omeprazole (PRILOSEC) 20 MG capsule Take 1 capsule (20 mg total) by mouth daily. 07/22/13   [name], MD  ondansetron (ZOFRAN) 4 MG tablet Take 1 tablet (4 mg total) by mouth every 8 (eight) hours as needed for nausea or vomiting. 10/13/14   Waldemar Dickens, MD  prochlorperazine (COMPAZINE) 10 MG tablet Take 10 mg by mouth every 6 (six) hours as needed (for nausea.).     Historical Provider, MD  promethazine (PHENERGAN) 25 MG tablet Take 1 tablet (25 mg total) by mouth every 6 (six) hours as needed for nausea. 02/13/13    Montine Circle, PA-C  UNABLE TO FIND Dispense compression sleeve 20-30 mm for this patient with history of breast cancer s/p surgery 09/14/14   Rulon Eisenmenger, MD   BP 152/94 mmHg  Pulse 118  Temp(Src) 98.5 F (36.9 C) (Oral)  Resp 16  SpO2 97% Physical Exam  Constitutional: She is oriented to person, place, and time. She appears well-developed and well-nourished. No distress.  HENT:  Head: Normocephalic and atraumatic.  Eyes: EOM are normal. Pupils are equal, round, and reactive to light.  Neck: Normal range of motion.  Cardiovascular: Normal rate, normal heart sounds and intact distal pulses.   No murmur heard. Pulmonary/Chest: Effort normal and breath sounds normal.  Abdominal: Soft.  Musculoskeletal: Normal range of motion. She exhibits edema.  Neurological: She is alert and oriented to person, place, and time. No cranial nerve deficit. She exhibits normal muscle tone. Coordination normal.  EOMI Cerebellar fxn nml   Skin: Skin is warm. She is not diaphoretic.  Psychiatric: She has a normal mood and affect. Her behavior is normal. Judgment and thought content normal.    ED Course  Procedures (including critical care time) Labs Review Labs Reviewed - No data to display  Imaging Review No results found.    Imitrex 6mg  [place], Decadron 10mg  IM, Zofran 4mg  ODT given in office    MDM   1. Dizziness   2. Headache in front of head    Etiology unclear.  Orthostatics nml May be related to HA w/ aura, intermittent orthostasis, viral illness. Intracranial growth is much less likely but a posibility given h/o breast cancer.  Treatment as above in office Continue zofran and NSAIDs, rest, fluids If not improving then go to ED for possible intracranial scan.  Precautions given and all questions answered  Linna Darner, MD Family Medicine 10/13/2014, 10:55 AM     Waldemar Dickens, MD 10/13/14 1055

## 2014-10-13 NOTE — Discharge Instructions (Signed)
The cause of your symptoms is not clear This may be related to your headache, intermittent low blood pressures, a viral illness, or other cause You were given a steroid, imitrex, and zofran medicine for your symptoms - primarily as headache treatment Please continue the zofran as needed Please take Advil 400-600mg  every 4-6 hours as needed for symptoms for the next 12 hours or so.  Please go tot the emergency room if you are not better or get worse in the next 48-72 hours.

## 2014-10-14 ENCOUNTER — Ambulatory Visit: Payer: Medicare Other | Attending: Hematology and Oncology | Admitting: Physical Therapy

## 2014-10-14 DIAGNOSIS — I972 Postmastectomy lymphedema syndrome: Secondary | ICD-10-CM

## 2014-10-14 DIAGNOSIS — I890 Lymphedema, not elsewhere classified: Secondary | ICD-10-CM | POA: Diagnosis not present

## 2014-10-14 DIAGNOSIS — R5381 Other malaise: Secondary | ICD-10-CM | POA: Diagnosis not present

## 2014-10-14 DIAGNOSIS — C50919 Malignant neoplasm of unspecified site of unspecified female breast: Secondary | ICD-10-CM | POA: Diagnosis not present

## 2014-10-14 DIAGNOSIS — E8989 Other postprocedural endocrine and metabolic complications and disorders: Secondary | ICD-10-CM | POA: Insufficient documentation

## 2014-10-14 DIAGNOSIS — Z5189 Encounter for other specified aftercare: Secondary | ICD-10-CM | POA: Diagnosis not present

## 2014-10-14 NOTE — Therapy (Signed)
[name], [place], [address] Phone: [phone]   Fax:  [phone]  Physical Therapy Treatment  Patient Details  Name: Tina Vaughan MRN: [id number] Date of Birth: Aug 16, 1961  Encounter Date: 10/14/2014      PT End of Session - 10/14/14 1748    Visit Number 3   Number of Visits 8   Date for PT Re-Evaluation 11/12/14   PT Start Time 1430   PT Stop Time 1518   PT Time Calculation (min) 48 min   Activity Tolerance Patient tolerated treatment well   Behavior During Therapy [facility] for tasks assessed/performed      Past Medical History  Diagnosis Date  . Frequent urination   . Depression   . Sleep apnea   . Eczema   . Neuromuscular disorder   . GERD (gastroesophageal reflux disease)   . 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 Breast cancer, stage 3 2012    left  . Lymphedema     Past Surgical History  Procedure Laterality Date  . Breast surgery  05/12/11    total mastectomy on right, radical mastectomy on left  . Portacath placement    . Laparoscopy  03/14/2012    Procedure: LAPAROSCOPY OPERATIVE;  Surgeon: Osborne Oman, MD;  Location: [facility];  Service: Gynecology;  Laterality: N/A;  . Salpingoophorectomy  03/14/2012    Procedure: SALPINGO OOPHERECTOMY;  Surgeon: Osborne Oman, MD;  Location: [facility];  Service: Gynecology;  Laterality: Bilateral;  . Port-a-cath removal  04/19/2012    Procedure: MINOR REMOVAL PORT-A-CATH;  Surgeon: Haywood Lasso, MD;  Location: [facility];  Service: General;  Laterality: N/A;    There were no vitals taken for this visit.  Visit Diagnosis:  Post-mastectomy lymphedema syndrome  Lymphedema of upper extremity following lymphadenectomy  Physical deconditioning      Subjective Assessment - 10/14/14 1747    Symptoms pt states she feels better than yesterday.  She went to urgent care and got some medicine   Patient Stated Goals to get her  lymphedema under control   Currently in Pain? No/denies      Treatment: Manual lymph drainage in supine as follows: short neck, right axillary nodes, left inguinal nodes, superficial and deep abdominals; anterior inter-axillary anastamoses, left axillo-inguinal anastamoses. Left upper extremity from fingers and dorsal hand to lateral shoulder redirecting along pathways.  Biotone applied to arm, thin stockinette elastomull to all 5 fingers, artiflex and 4 short stretch bandages         Plan - 10/14/14 1749    Clinical Impression Statement arm appears to be responding to compression. She will take the bandages off Sat am and wear compression garments this weekend to see if she is able to maintain improvements. Issued tg soft for her to wear at night   PT Next Visit Plan Remeasure.  If reduction maintained with compression garments, begin instruction in strength ABC program              LYMPHEDEMA/ONCOLOGY QUESTIONNAIRE - 10/14/14 1438    Left Upper Extremity Lymphedema   15 cm Proximal to Olecranon Process 39.5 cm   10 cm Proximal to Olecranon Process 38.6 cm   Olecranon Process 29.2 cm   15 cm Proximal to Ulnar Styloid Process 30.3 cm   10 cm Proximal to Ulnar Styloid Process 26.2 cm   Just Proximal to Ulnar Styloid Process 17.3 cm   Across Hand at  Thumb Web Space 20.4 cm   At [place] of 2nd Digit 6.42 cm            Long Term Clinic Goals - 10/14/14 1751    CC Long Term Goal  #1   Title pt will have an understanding of lymphedema risk reduction practices   Time 4   Period Weeks   Status On-going   CC Long Term Goal  #2   Title pt will be able to perform self manual lymph drainage and use compression for self management of lymphedema in  her left upper extremity   Time 4   Period Weeks   Status On-going   CC Long Term Goal  #3   Title pt will have reducition of circumferential measurement of left arm by  1.0 cm at 10 cm above the ulnar styloid   Time 4   Period  Weeks   Status On-going   CC Long Term Goal  #4   Title pt will verbalize understanding of a strength exercise program for her upper extremitites   Time 4   Period Weeks   Status On-going   CC Long Term Goal  #5   Title Patient will have a reduction in Quick Dash score by 5 points   Time 4   Period Weeks   Status On-going         Problem List Patient Active Problem List   Diagnosis Date Noted  . Lymphedema of upper extremity 09/11/2014  . Status post prophylactic bilateral salpingo-oophorectomy for BRCA 03/19/2012  . Breast cancer, stage 3 05/18/2011   Tina Vaughan, PT  Norwood Levo 10/14/2014, 5:53 PM

## 2014-10-19 ENCOUNTER — Encounter (HOSPITAL_COMMUNITY): Payer: Self-pay | Admitting: Emergency Medicine

## 2014-10-19 ENCOUNTER — Emergency Department (HOSPITAL_COMMUNITY)
Admission: EM | Admit: 2014-10-19 | Discharge: 2014-10-19 | Disposition: A | Discharge status: Home / Self Care | Payer: Medicare Other | Attending: Emergency Medicine | Admitting: Emergency Medicine

## 2014-10-19 ENCOUNTER — Emergency Department (HOSPITAL_COMMUNITY): Payer: Medicare Other

## 2014-10-19 ENCOUNTER — Ambulatory Visit: Payer: Medicare Other | Admitting: Physical Therapy

## 2014-10-19 DIAGNOSIS — F329 Major depressive disorder, single episode, unspecified: Secondary | ICD-10-CM | POA: Diagnosis not present

## 2014-10-19 DIAGNOSIS — G629 Polyneuropathy, unspecified: Secondary | ICD-10-CM | POA: Diagnosis not present

## 2014-10-19 DIAGNOSIS — Z872 Personal history of diseases of the skin and subcutaneous tissue: Secondary | ICD-10-CM | POA: Insufficient documentation

## 2014-10-19 DIAGNOSIS — Z3202 Encounter for pregnancy test, result negative: Secondary | ICD-10-CM | POA: Diagnosis not present

## 2014-10-19 DIAGNOSIS — Z8679 Personal history of other diseases of the circulatory system: Secondary | ICD-10-CM | POA: Insufficient documentation

## 2014-10-19 DIAGNOSIS — K760 Fatty (change of) liver, not elsewhere classified: Secondary | ICD-10-CM | POA: Diagnosis not present

## 2014-10-19 DIAGNOSIS — D259 Leiomyoma of uterus, unspecified: Secondary | ICD-10-CM | POA: Diagnosis not present

## 2014-10-19 DIAGNOSIS — Z79899 Other long term (current) drug therapy: Secondary | ICD-10-CM | POA: Diagnosis not present

## 2014-10-19 DIAGNOSIS — R1010 Upper abdominal pain, unspecified: Secondary | ICD-10-CM | POA: Insufficient documentation

## 2014-10-19 DIAGNOSIS — Z853 Personal history of malignant neoplasm of breast: Secondary | ICD-10-CM | POA: Diagnosis not present

## 2014-10-19 DIAGNOSIS — R110 Nausea: Secondary | ICD-10-CM | POA: Diagnosis not present

## 2014-10-19 LAB — URINALYSIS, ROUTINE W REFLEX MICROSCOPIC
BILIRUBIN URINE: NEGATIVE
Glucose, UA: NEGATIVE mg/dL
Hgb urine dipstick: NEGATIVE
KETONES UR: NEGATIVE mg/dL
LEUKOCYTES UA: NEGATIVE
Nitrite: NEGATIVE
PROTEIN: NEGATIVE mg/dL
Specific Gravity, Urine: 1.004 — ABNORMAL LOW (ref 1.005–1.030)
Urobilinogen, UA: 0.2 mg/dL (ref 0.0–1.0)
pH: 6 (ref 5.0–8.0)

## 2014-10-19 LAB — COMPREHENSIVE METABOLIC PANEL
ALT: 26 U/L (ref 0–35)
AST: 17 U/L (ref 0–37)
Albumin: 4.2 g/dL (ref 3.5–5.2)
Alkaline Phosphatase: 65 U/L (ref 39–117)
Anion gap: 11 (ref 5–15)
BILIRUBIN TOTAL: 0.3 mg/dL (ref 0.3–1.2)
BUN: 10 mg/dL (ref 6–23)
CALCIUM: 9.5 mg/dL (ref 8.4–10.5)
CO2: 26 meq/L (ref 19–32)
CREATININE: 0.61 mg/dL (ref 0.50–1.10)
Chloride: 101 mEq/L (ref 96–112)
GLUCOSE: 101 mg/dL — AB (ref 70–99)
Potassium: 4.1 mEq/L (ref 3.7–5.3)
Sodium: 138 mEq/L (ref 137–147)
Total Protein: 7.5 g/dL (ref 6.0–8.3)

## 2014-10-19 LAB — CBC WITH DIFFERENTIAL/PLATELET
Basophils Absolute: 0 10*3/uL (ref 0.0–0.1)
Basophils Relative: 0 % (ref 0–1)
EOS PCT: 2 % (ref 0–5)
Eosinophils Absolute: 0.1 10*3/uL (ref 0.0–0.7)
HCT: 39.6 % (ref 36.0–46.0)
Hemoglobin: 13.2 g/dL (ref 12.0–15.0)
LYMPHS ABS: 1.5 10*3/uL (ref 0.7–4.0)
LYMPHS PCT: 28 % (ref 12–46)
MCH: 29.3 pg (ref 26.0–34.0)
MCHC: 33.3 g/dL (ref 30.0–36.0)
MCV: 88 fL (ref 78.0–100.0)
MONOS PCT: 8 % (ref 3–12)
Monocytes Absolute: 0.4 10*3/uL (ref 0.1–1.0)
Neutro Abs: 3.3 10*3/uL (ref 1.7–7.7)
Neutrophils Relative %: 62 % (ref 43–77)
Platelets: 192 10*3/uL (ref 150–400)
RBC: 4.5 MIL/uL (ref 3.87–5.11)
RDW: 12.8 % (ref 11.5–15.5)
WBC: 5.3 10*3/uL (ref 4.0–10.5)

## 2014-10-19 LAB — PREGNANCY, URINE: Preg Test, Ur: NEGATIVE

## 2014-10-19 LAB — LIPASE, BLOOD: LIPASE: 41 U/L (ref 11–59)

## 2014-10-19 MED ORDER — IOHEXOL 300 MG/ML  SOLN
50.0000 mL | Freq: Once | INTRAMUSCULAR | Status: AC | PRN
  Administered 2014-10-19: 50 mL via ORAL

## 2014-10-19 MED ORDER — IOHEXOL 300 MG/ML  SOLN
100.0000 mL | Freq: Once | INTRAMUSCULAR | Status: AC | PRN
  Administered 2014-10-19: 100 mL via INTRAVENOUS

## 2014-10-19 MED ORDER — SODIUM CHLORIDE 0.9 % IV BOLUS (SEPSIS)
2000.0000 mL | Freq: Once | INTRAVENOUS | Status: AC
  Administered 2014-10-19: 2000 mL via INTRAVENOUS

## 2014-10-19 MED ORDER — PROMETHAZINE HCL 25 MG PO TABS: 25.0000 mg | ORAL_TABLET | Freq: Four times a day (QID) | ORAL | Status: DC | PRN

## 2014-10-19 MED ORDER — ONDANSETRON HCL 4 MG/2ML IJ SOLN
4.0000 mg | Freq: Once | INTRAMUSCULAR | Status: AC
  Administered 2014-10-19: 4 mg via INTRAVENOUS
  Filled 2014-10-19: qty 2

## 2014-10-19 NOTE — ED Provider Notes (Signed)
CSN: [id number]     Arrival date & time 10/19/14  1229 History   First MD Initiated Contact with Patient 10/19/14 1504     Chief Complaint  Patient presents with  . Nausea     HPI Patient ports nausea past 7 days without vomiting.  She has a history of breast cancer status post bilateral mastectomies and cancer treatment.  She's been cancer free for approximately 2 years.  She reports some upper abdominal pain over the past several days as well.  Denies back pain.  No urinary complaints.  No chest pain or shortness of breath.  Denies headaches.  Denies weakness of the arms or legs.  She reports she feels somewhat lightheaded when she stands up.  Mild decreased oral intake over the past 24-48 hours.  Reports abdominal pain is more pronounced.   Past Medical History  Diagnosis Date  . Frequent urination   . Depression   . Sleep apnea   . Eczema   . Neuromuscular disorder   . GERD (gastroesophageal reflux disease)   . 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 Breast cancer, stage 3 2012    left  . Lymphedema    Past Surgical History  Procedure Laterality Date  . Breast surgery  05/12/11    total mastectomy on right, radical mastectomy on left  . Portacath placement    . Laparoscopy  03/14/2012    Procedure: LAPAROSCOPY OPERATIVE;  Surgeon: Osborne Oman, MD;  Location: [facility];  Service: Gynecology;  Laterality: N/A;  . Salpingoophorectomy  03/14/2012    Procedure: SALPINGO OOPHERECTOMY;  Surgeon: Osborne Oman, MD;  Location: [facility];  Service: Gynecology;  Laterality: Bilateral;  . Port-a-cath removal  04/19/2012    Procedure: MINOR REMOVAL PORT-A-CATH;  Surgeon: Haywood Lasso, MD;  Location: [facility];  Service: General;  Laterality: N/A;   Family History  Problem Relation Age of Onset  . Cancer Mother     Breast cancer and pancreatic cancer x 2  . Stroke Father   . Hypertension Father    History  Substance Use Topics  . Smoking status: Never  Smoker   . Smokeless tobacco: Never Used  . Alcohol Use: Yes     Comment: social   OB History    Gravida Para Term Preterm AB TAB SAB Ectopic Multiple Living   1    1 1          Review of Systems  All other systems reviewed and are negative.     Allergies  Doxycycline hydrochloride  Home Medications   Prior to Admission medications   Medication Sig Start Date End Date Taking? Authorizing Provider  ALPRAZolam Duanne Moron) 0.5 MG tablet Take 1 tablet (0.5 mg total) by mouth at bedtime as needed for sleep. 12/10/13  Yes [name], MD  anastrozole (ARIMIDEX) 1 MG tablet Take 1 tablet (1 mg total) by mouth daily. 07/22/13  Yes [name], MD  Calcium 200 MG TABS Take 200 mg by mouth daily.    Yes Historical Provider, MD  cholecalciferol (VITAMIN D) 1000 UNITS tablet Take 1,000 Units by mouth daily.    Yes Historical Provider, MD  citalopram (CELEXA) 40 MG tablet TAKE 1 TABLET BY MOUTH DAILY. 09/30/14  Yes Rulon Eisenmenger, MD  fluocinonide cream (LIDEX) 7.37 % Apply 1 application topically 2 (two) times daily as needed (for rash.).   Yes Historical Provider, MD  gabapentin (NEURONTIN) 300 MG  capsule Take 1 capsule (300 mg total) by mouth daily. 02/10/14  Yes [name], MD  ibuprofen (ADVIL,MOTRIN) 200 MG tablet Take 600 mg by mouth every 8 (eight) hours as needed for moderate pain (pain).    Yes Historical Provider, MD  omeprazole (PRILOSEC) 20 MG capsule Take 1 capsule (20 mg total) by mouth daily. 07/22/13  Yes [name], MD  ondansetron (ZOFRAN) 4 MG tablet Take 1 tablet (4 mg total) by mouth every 8 (eight) hours as needed for nausea or vomiting. 10/13/14  Yes Waldemar Dickens, MD  UNABLE TO FIND Dispense compression sleeve 20-30 mm for this patient with history of breast cancer s/p surgery 09/14/14  Yes Rulon Eisenmenger, MD  naproxen sodium (ANAPROX) 220 MG tablet Take 220 mg by mouth 2 (two) times daily as needed (for muscle aches.).    Historical Provider, MD  prochlorperazine  (COMPAZINE) 10 MG tablet Take 10 mg by mouth every 6 (six) hours as needed (for nausea.).     Historical Provider, MD  promethazine (PHENERGAN) 25 MG tablet Take 1 tablet (25 mg total) by mouth every 6 (six) hours as needed for nausea. 10/19/14   Hoy Morn, MD   BP 126/81 mmHg  Pulse 71  Temp(Src) 98 F (36.7 C) (Oral)  Resp 16  SpO2 98% Physical Exam  Constitutional: She is oriented to person, place, and time. She appears well-developed and well-nourished. No distress.  HENT:  Head: Normocephalic and atraumatic.  Eyes: EOM are normal.  Neck: Normal range of motion.  Cardiovascular: Normal rate, regular rhythm and normal heart sounds.   Pulmonary/Chest: Effort normal and breath sounds normal.  Abdominal: Soft. She exhibits no distension.  Mild upper abdominal tenderness without guarding or rebound.  Musculoskeletal: Normal range of motion.  Neurological: She is alert and oriented to person, place, and time.  Gait normal.  Ambulatory in the hall.  Normal strength in arms and legs  Skin: Skin is warm and dry.  Psychiatric: She has a normal mood and affect. Judgment normal.  Nursing note and vitals reviewed.   ED Course  Procedures (including critical care time) Labs Review Labs Reviewed  COMPREHENSIVE METABOLIC PANEL - Abnormal; Notable for the following:    Glucose, Bld 101 (*)    All other components within normal limits  URINALYSIS, ROUTINE W REFLEX MICROSCOPIC - Abnormal; Notable for the following:    Specific Gravity, Urine 1.004 (*)    All other components within normal limits  CBC WITH DIFFERENTIAL  LIPASE, BLOOD  PREGNANCY, URINE    Imaging Review Ct Abdomen Pelvis W Contrast  10/19/2014   CLINICAL DATA:  Nausea and dizziness. Right lower quadrant abdominal pain. Personal history of breast cancer.  EXAM: CT ABDOMEN AND PELVIS WITH CONTRAST  TECHNIQUE: Multidetector CT imaging of the abdomen and pelvis was performed using the standard protocol following bolus  administration of intravenous contrast.  CONTRAST:  14mL OMNIPAQUE IOHEXOL 300 MG/ML SOLN, 13mL OMNIPAQUE IOHEXOL 300 MG/ML SOLN  COMPARISON:  11/25/2010  FINDINGS: Lower chest:  Unremarkable  Hepatobiliary: Diffuse hepatic steatosis.  Pancreas: Unremarkable  Spleen: Unremarkable  Adrenals/Urinary Tract: Unremarkable  Stomach/Bowel: Unremarkable.  Appendix normal.  Vascular/Lymphatic: Unremarkable  Reproductive: Multiple calcified uterine fibroids.  Other: No supplemental non-categorized findings.  Musculoskeletal: Mild disc bulges at L3-4 and L4-5.  IMPRESSION: 1. No cause for right lower quadrant abdominal pain and nausea identified. 2. Multiple calcified uterine fibroids. 3. Several lumbar disc bulges noted. 4. Diffuse hepatic steatosis.   Electronically Signed  By: Sherryl Barters M.D.   On: 10/19/2014 18:42  I personally reviewed the imaging tests through PACS system I reviewed available ER/hospitalization records through the EMR    EKG Interpretation None      MDM   Final diagnoses:  Nausea  Upper abdominal pain  History of breast cancer    Patient's nausea is improved at this time.  She still feels slightly lightheaded.  Abdominal CT without pathology including no radiographic evidence of metastatic spread.  Labs and urine without significant abnormality.  Discharge home with PCP follow-up.  Home with Phenergan for nausea.  She has Zofran at home.    Hoy Morn, MD 10/19/14 2033

## 2014-10-19 NOTE — ED Notes (Signed)
Per pt, states nausea and dizziness since the 2 nd-was worked up at Urgent Care-was told to come here if not better-states Zofran not working

## 2014-10-20 ENCOUNTER — Telehealth: Payer: Self-pay | Admitting: *Deleted

## 2014-10-20 ENCOUNTER — Ambulatory Visit (HOSPITAL_BASED_OUTPATIENT_CLINIC_OR_DEPARTMENT_OTHER): Payer: Medicare Other | Admitting: Nurse Practitioner

## 2014-10-20 ENCOUNTER — Other Ambulatory Visit: Payer: Self-pay | Admitting: *Deleted

## 2014-10-20 ENCOUNTER — Ambulatory Visit (HOSPITAL_BASED_OUTPATIENT_CLINIC_OR_DEPARTMENT_OTHER): Payer: Medicare Other

## 2014-10-20 VITALS — BP 135/80 | HR 100 | Temp 98.4°F | Resp 20 | Ht 62.0 in | Wt 250.3 lb

## 2014-10-20 DIAGNOSIS — I890 Lymphedema, not elsewhere classified: Secondary | ICD-10-CM | POA: Diagnosis not present

## 2014-10-20 DIAGNOSIS — R51 Headache: Secondary | ICD-10-CM

## 2014-10-20 DIAGNOSIS — R110 Nausea: Secondary | ICD-10-CM

## 2014-10-20 DIAGNOSIS — Z853 Personal history of malignant neoplasm of breast: Secondary | ICD-10-CM | POA: Diagnosis not present

## 2014-10-20 DIAGNOSIS — R42 Dizziness and giddiness: Secondary | ICD-10-CM

## 2014-10-20 DIAGNOSIS — C50919 Malignant neoplasm of unspecified site of unspecified female breast: Secondary | ICD-10-CM

## 2014-10-20 DIAGNOSIS — R52 Pain, unspecified: Secondary | ICD-10-CM

## 2014-10-20 DIAGNOSIS — I951 Orthostatic hypotension: Secondary | ICD-10-CM

## 2014-10-20 DIAGNOSIS — R519 Headache, unspecified: Secondary | ICD-10-CM

## 2014-10-20 MED ORDER — DIPHENHYDRAMINE HCL 50 MG/ML IJ SOLN
INTRAMUSCULAR | Status: AC
  Filled 2014-10-20: qty 1

## 2014-10-20 MED ORDER — KETOROLAC TROMETHAMINE 30 MG/ML IJ SOLN
30.0000 mg | Freq: Once | INTRAMUSCULAR | Status: AC
  Administered 2014-10-20: 30 mg via INTRAVENOUS

## 2014-10-20 MED ORDER — DIPHENHYDRAMINE HCL 50 MG/ML IJ SOLN
25.0000 mg | Freq: Once | INTRAMUSCULAR | Status: AC
  Administered 2014-10-20: 25 mg via INTRAVENOUS

## 2014-10-20 MED ORDER — SODIUM CHLORIDE 0.9 % IV SOLN
Freq: Once | INTRAVENOUS | Status: AC
  Administered 2014-10-20: 16:00:00 via INTRAVENOUS

## 2014-10-20 MED ORDER — ONDANSETRON 8 MG/50ML IVPB (CHCC)
8.0000 mg | Freq: Once | INTRAVENOUS | Status: AC
  Administered 2014-10-20: 8 mg via INTRAVENOUS

## 2014-10-20 MED ORDER — ONDANSETRON 8 MG/NS 50 ML IVPB
INTRAVENOUS | Status: AC
  Filled 2014-10-20: qty 8

## 2014-10-20 MED ORDER — KETOROLAC TROMETHAMINE 30 MG/ML IJ SOLN
INTRAMUSCULAR | Status: AC
  Filled 2014-10-20: qty 1

## 2014-10-20 NOTE — Patient Instructions (Signed)

## 2014-10-20 NOTE — Telephone Encounter (Signed)
Patient called to report that she has had nausea and dizziness/lightheadedness for about 10 days. Seen in urgent care and seen in ED yesterday. Patient states that her BP is normal and vertigo was ruled out. CT of abdomen done. Patient denies nausea at present but still has some lightheadedness that lasts about 1-2 seconds. States that it happens when she is lying down or even just when she is sitting still. Patient scheduled to see Tina Vaughan today.

## 2014-10-20 NOTE — Progress Notes (Signed)
Patient reports headache. @ 1535  Asked patient if she would like to try some peppermint oil topically at site of headache.  Patient is agreeable.  Used two drops on right forehead and behind her neck.  Patient states it smells really good. 1400.  Patient still has headache and is now getting IV medication for this.

## 2014-10-21 ENCOUNTER — Ambulatory Visit: Payer: Medicare Other | Admitting: Physical Therapy

## 2014-10-21 DIAGNOSIS — I972 Postmastectomy lymphedema syndrome: Secondary | ICD-10-CM

## 2014-10-21 DIAGNOSIS — C50919 Malignant neoplasm of unspecified site of unspecified female breast: Secondary | ICD-10-CM | POA: Diagnosis not present

## 2014-10-21 DIAGNOSIS — E8989 Other postprocedural endocrine and metabolic complications and disorders: Secondary | ICD-10-CM | POA: Diagnosis not present

## 2014-10-21 DIAGNOSIS — Z5189 Encounter for other specified aftercare: Secondary | ICD-10-CM | POA: Diagnosis not present

## 2014-10-21 DIAGNOSIS — I890 Lymphedema, not elsewhere classified: Secondary | ICD-10-CM

## 2014-10-21 DIAGNOSIS — R5381 Other malaise: Secondary | ICD-10-CM | POA: Diagnosis not present

## 2014-10-22 ENCOUNTER — Telehealth: Payer: Self-pay | Admitting: Nurse Practitioner

## 2014-10-22 ENCOUNTER — Telehealth: Payer: Self-pay | Admitting: *Deleted

## 2014-10-22 ENCOUNTER — Encounter: Payer: Self-pay | Admitting: Nurse Practitioner

## 2014-10-22 DIAGNOSIS — R51 Headache: Secondary | ICD-10-CM

## 2014-10-22 DIAGNOSIS — R519 Headache, unspecified: Secondary | ICD-10-CM | POA: Insufficient documentation

## 2014-10-22 NOTE — Telephone Encounter (Signed)
Called patient to check in; to see if her headache has completely resolved.  No answer.  Left voice message for patient to follow up.

## 2014-10-22 NOTE — Assessment & Plan Note (Signed)
Patient has a history of chronic migraine headaches in the past.  She is complaining of a chronic headache for the past 11 days.  She states that she went to the urgent care clinic and was treated for a migraine headache at that time with IV steroids and IV Imitrex.  She states that these medications made her feel nauseous; it did not resolve her headache.  She presented to the emergency department just this past weekend with the same complaints; and obtained labs, urinalysis, and a CT of the abdomen; all with no acute findings.  No neurological deficits on exam today.  Patient did not appear in acute distress.  Patient was given 1 L normal saline IV fluid rehydration; along with Toradol 30 mg IV, Benadryl 25 mg IV, and Zofran 8 mg IV.  She was observed resting quietly and comfortably while receiving the infusion.  Instructed patient to let us know if her headache continues; and may very well need to obtain a head CT and/or a neurological consult if her chronic headache and other symptoms continue.  Advised patient and her family member to call or go directly to the emergency department if she developed any worsening symptoms whatsoever.

## 2014-10-22 NOTE — Progress Notes (Signed)
will   SYMPTOM MANAGEMENT CLINIC   HPI: Tina Vaughan 53 y.o. female diagnosed with breast cancer.  Patient status post bilateral mastectomy and radiation therapy completed in June 2012.  Currently undergoing anastrozole therapy.  Patient called the cancer Center today requesting urgent care visit.  She is complaining of an approximately 11 day history of chronic headache, lightheadedness, and frequent nausea.  She denies any vomiting.  She denies any recent fevers or chills.  She denies any vision changes.  She does take Neurontin on a daily basis.  Patient has a history of chronic migraines in the past; and states that she was seen in the urgent care clinic a week or so ago.  At that time she was given IV steroids and IV Imitrex.  Patient states that the medications were too strong for her; and she became very nauseous.  These medications did not relieve her headache.  She also presented to the emergency department over this past weekend; and had a full workup which included labs, urinalysis, and a CT of the abdomen/pelvis.  There were no acute findings with either labs, urinalysis, or CT.  Also, patient has recently developed some left arm lymphedema; but has been going to the lymphedema clinic for treatment on a regular basis.  I she states her lymphedema is almost completely resolved at this time.  She typically wears her compression sleeve as well.  Patient also wears C Pap at night as her baseline.   HPI   ROS  Past Medical History  Diagnosis Date  . Frequent urination   . Depression   . Sleep apnea   . Eczema   . Neuromuscular disorder   . GERD (gastroesophageal reflux disease)   . 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 Breast cancer, stage 3 2012    left  . Lymphedema     Past Surgical History  Procedure Laterality Date  . Breast surgery  05/12/11    total mastectomy on right, radical mastectomy on left  . Portacath placement    . Laparoscopy  03/14/2012   Procedure: LAPAROSCOPY OPERATIVE;  Surgeon: Osborne Oman, MD;  Location: [facility];  Service: Gynecology;  Laterality: N/A;  . Salpingoophorectomy  03/14/2012    Procedure: SALPINGO OOPHERECTOMY;  Surgeon: Osborne Oman, MD;  Location: [facility];  Service: Gynecology;  Laterality: Bilateral;  . Port-a-cath removal  04/19/2012    Procedure: MINOR REMOVAL PORT-A-CATH;  Surgeon: Haywood Lasso, MD;  Location: [facility];  Service: General;  Laterality: N/A;    has Breast cancer, stage 3; Status post prophylactic bilateral salpingo-oophorectomy for BRCA; Lymphedema of upper extremity; and Headache on her problem list.     is allergic to doxycycline hydrochloride.    Medication List       This list is accurate as of: 10/20/14 11:59 PM.  Always use your most recent med list.               ALPRAZolam 0.5 MG tablet  Commonly known as:  XANAX  Take 1 tablet (0.5 mg total) by mouth at bedtime as needed for sleep.     anastrozole 1 MG tablet  Commonly known as:  ARIMIDEX  Take 1 tablet (1 mg total) by mouth daily.     Calcium 200 MG Tabs  Take 200 mg by mouth daily.     cholecalciferol 1000 UNITS tablet  Commonly known as:  VITAMIN D  Take 1,000 Units by  mouth daily.     citalopram 40 MG tablet  Commonly known as:  CELEXA  TAKE 1 TABLET BY MOUTH DAILY.     fluocinonide cream 0.05 %  Commonly known as:  LIDEX  Apply 1 application topically 2 (two) times daily as needed (for rash.).     gabapentin 300 MG capsule  Commonly known as:  NEURONTIN  Take 1 capsule (300 mg total) by mouth daily.     ibuprofen 200 MG tablet  Commonly known as:  ADVIL,MOTRIN  Take 600 mg by mouth every 8 (eight) hours as needed for moderate pain (pain).     naproxen sodium 220 MG tablet  Commonly known as:  ANAPROX  Take 220 mg by mouth 2 (two) times daily as needed (for muscle aches.).     omeprazole 20 MG capsule  Commonly known as:  PRILOSEC  Take 1 capsule (20 mg total) by mouth  daily.     ondansetron 4 MG tablet  Commonly known as:  ZOFRAN  Take 1 tablet (4 mg total) by mouth every 8 (eight) hours as needed for nausea or vomiting.     prochlorperazine 10 MG tablet  Commonly known as:  COMPAZINE  Take 10 mg by mouth every 6 (six) hours as needed (for nausea.).     promethazine 25 MG tablet  Commonly known as:  PHENERGAN  Take 1 tablet (25 mg total) by mouth every 6 (six) hours as needed for nausea.     UNABLE TO FIND  Dispense compression sleeve 20-30 mm for this patient with history of breast cancer s/p surgery         PHYSICAL EXAMINATION  Blood pressure 135/80, pulse 100, temperature 98.4 F (36.9 C), temperature source Oral, resp. rate 20, height _0  (1.575 m), weight 250 lb 4.8 oz (113.535 kg).  Physical Exam  Constitutional: She is oriented to person, place, and time and well-developed, well-nourished, and in no distress.  HENT:  Head: Normocephalic and atraumatic.  Right Ear: External ear normal.  Left Ear: External ear normal.  Mouth/Throat: Oropharynx is clear and moist.  Eyes: Conjunctivae and EOM are normal. Pupils are equal, round, and reactive to light. Right eye exhibits no discharge. Left eye exhibits no discharge. No scleral icterus.  Neck: Normal range of motion. Neck supple. No JVD present. No tracheal deviation present. No thyromegaly present.  Cardiovascular: Normal rate, regular rhythm, normal heart sounds and intact distal pulses.   Pulmonary/Chest: Effort normal and breath sounds normal. No respiratory distress. She has no wheezes. She has no rales.  Abdominal: Soft. Bowel sounds are normal. She exhibits no distension and no mass. There is no tenderness. There is no rebound and no guarding.  Musculoskeletal: Normal range of motion. She exhibits edema. She exhibits no tenderness.  Trace left arm lymphedema only.  Patient is not wearing a compression sleeve today.  Lymphadenopathy:    She has no cervical adenopathy.    Neurological: She is alert and oriented to person, place, and time. Gait normal.  Skin: Skin is warm and dry. No rash noted. No erythema.  Psychiatric: Affect normal.  Nursing note and vitals reviewed.   LABORATORY DATA:. Admission on 10/19/2014, Discharged on 10/19/2014  Component Date Value Ref Range Status  . WBC 10/19/2014 5.3  4.0 - 10.5 K/uL Final  . RBC 10/19/2014 4.50  3.87 - 5.11 MIL/uL Final  . Hemoglobin 10/19/2014 13.2  12.0 - 15.0 g/dL Final  . HCT 10/19/2014 39.6  36.0 - 46.0 % Final  .  MCV 10/19/2014 88.0  78.0 - 100.0 fL Final  . MCH 10/19/2014 29.3  26.0 - 34.0 pg Final  . MCHC 10/19/2014 33.3  30.0 - 36.0 g/dL Final  . RDW 10/19/2014 12.8  11.5 - 15.5 % Final  . Platelets 10/19/2014 192  150 - 400 K/uL Final  . Neutrophils Relative % 10/19/2014 62  43 - 77 % Final  . Neutro Abs 10/19/2014 3.3  1.7 - 7.7 K/uL Final  . Lymphocytes Relative 10/19/2014 28  12 - 46 % Final  . Lymphs Abs 10/19/2014 1.5  0.7 - 4.0 K/uL Final  . Monocytes Relative 10/19/2014 8  3 - 12 % Final  . Monocytes Absolute 10/19/2014 0.4  0.1 - 1.0 K/uL Final  . Eosinophils Relative 10/19/2014 2  0 - 5 % Final  . Eosinophils Absolute 10/19/2014 0.1  0.0 - 0.7 K/uL Final  . Basophils Relative 10/19/2014 0  0 - 1 % Final  . Basophils Absolute 10/19/2014 0.0  0.0 - 0.1 K/uL Final  . Sodium 10/19/2014 138  137 - 147 mEq/L Final  . Potassium 10/19/2014 4.1  3.7 - 5.3 mEq/L Final  . Chloride 10/19/2014 101  96 - 112 mEq/L Final  . CO2 10/19/2014 26  19 - 32 mEq/L Final  . Glucose, Bld 10/19/2014 101* 70 - 99 mg/dL Final  . BUN 10/19/2014 10  6 - 23 mg/dL Final  . Creatinine, Ser 10/19/2014 0.61  0.50 - 1.10 mg/dL Final  . Calcium 10/19/2014 9.5  8.4 - 10.5 mg/dL Final  . Total Protein 10/19/2014 7.5  6.0 - 8.3 g/dL Final  . Albumin 10/19/2014 4.2  3.5 - 5.2 g/dL Final  . AST 10/19/2014 17  0 - 37 U/L Final  . ALT 10/19/2014 26  0 - 35 U/L Final  . Alkaline Phosphatase 10/19/2014 65  39 - 117 U/L  Final  . Total Bilirubin 10/19/2014 0.3  0.3 - 1.2 mg/dL Final  . GFR calc non Af Amer 10/19/2014 >90  >90 mL/min Final  . GFR calc Af Amer 10/19/2014 >90  >90 mL/min Final   Comment: (NOTE) The eGFR has been calculated using the CKD EPI equation. This calculation has not been validated in all clinical situations. eGFR's persistently <90 mL/min signify possible Chronic Kidney Disease.   . Anion gap 10/19/2014 11  5 - 15 Final  . Lipase 10/19/2014 41  11 - 59 U/L Final  . Color, Urine 10/19/2014 YELLOW  YELLOW Final  . APPearance 10/19/2014 CLEAR  CLEAR Final  . Specific Gravity, Urine 10/19/2014 1.004* 1.005 - 1.030 Final  . pH 10/19/2014 6.0  5.0 - 8.0 Final  . Glucose, UA 10/19/2014 NEGATIVE  NEGATIVE mg/dL Final  . Hgb urine dipstick 10/19/2014 NEGATIVE  NEGATIVE Final  . Bilirubin Urine 10/19/2014 NEGATIVE  NEGATIVE Final  . Ketones, ur 10/19/2014 NEGATIVE  NEGATIVE mg/dL Final  . Protein, ur 10/19/2014 NEGATIVE  NEGATIVE mg/dL Final  . Urobilinogen, UA 10/19/2014 0.2  0.0 - 1.0 mg/dL Final  . Nitrite 10/19/2014 NEGATIVE  NEGATIVE Final  . Leukocytes, UA 10/19/2014 NEGATIVE  NEGATIVE Final   MICROSCOPIC NOT DONE ON URINES WITH NEGATIVE PROTEIN, BLOOD, LEUKOCYTES, NITRITE, OR GLUCOSE <1000 mg/dL.  . Preg Test, Ur 10/19/2014 NEGATIVE  NEGATIVE Final   Comment:        THE SENSITIVITY OF THIS METHODOLOGY IS >20 mIU/mL.      RADIOGRAPHIC STUDIES: Ct Abdomen Pelvis W Contrast  10/19/2014   CLINICAL DATA:  Nausea and dizziness. Right lower quadrant  abdominal pain. Personal history of breast cancer.  EXAM: CT ABDOMEN AND PELVIS WITH CONTRAST  TECHNIQUE: Multidetector CT imaging of the abdomen and pelvis was performed using the standard protocol following bolus administration of intravenous contrast.  CONTRAST:  171mL OMNIPAQUE IOHEXOL 300 MG/ML SOLN, 7mL OMNIPAQUE IOHEXOL 300 MG/ML SOLN  COMPARISON:  11/25/2010  FINDINGS: Lower chest:  Unremarkable  Hepatobiliary: Diffuse hepatic  steatosis.  Pancreas: Unremarkable  Spleen: Unremarkable  Adrenals/Urinary Tract: Unremarkable  Stomach/Bowel: Unremarkable.  Appendix normal.  Vascular/Lymphatic: Unremarkable  Reproductive: Multiple calcified uterine fibroids.  Other: No supplemental non-categorized findings.  Musculoskeletal: Mild disc bulges at L3-4 and L4-5.  IMPRESSION: 1. No cause for right lower quadrant abdominal pain and nausea identified. 2. Multiple calcified uterine fibroids. 3. Several lumbar disc bulges noted. 4. Diffuse hepatic steatosis.   Electronically Signed   By: Sherryl Barters M.D.   On: 10/19/2014 18:42    ASSESSMENT/PLAN:    Breast cancer, stage 3 Patient is status post bilateral mastectomy.  She completed radiation therapy in June 2012.  She is currently undergoing anastrozole therapy on a daily basis.  She is scheduled to return for a follow-up visit on 03/04/2015.  Headache Patient has a history of chronic migraine headaches in the past.  She is complaining of a chronic headache for the past 11 days.  She states that she went to the urgent care clinic and was treated for a migraine headache at that time with IV steroids and IV Imitrex.  She states that these medications made her feel nauseous; it did not resolve her headache.  She presented to the emergency department just this past weekend with the same complaints; and obtained labs, urinalysis, and a CT of the abdomen; all with no acute findings.  No neurological deficits on exam today.  Patient did not appear in acute distress.  Patient was given 1 L normal saline IV fluid rehydration; along with Toradol 30 mg IV, Benadryl 25 mg IV, and Zofran 8 mg IV.  She was observed resting quietly and comfortably while receiving the infusion.  Instructed patient to let us know if her headache continues; and may very well need to obtain a head CT and/or a neurological consult if her chronic headache and other symptoms continue.  Advised patient and her family member to  call or go directly to the emergency department if she developed any worsening symptoms whatsoever.  Lymphedema of upper extremity Patient just recently developed some left arm lymphedema.  I she has been diligently going to the lymphedema clinic and wearing her lymphedema sleeve on a fairly regular basis.  Patient has minimal left arm lymphedema today on exam.  However, patient did not wear her compression sleeve today; stating that she felt too bad to where it today.  Patient does have some lymphedema clinic appointments already scheduled.  Patient stated understanding of all instructions; and was in agreement with this plan of care. The patient knows to call the clinic with any problems, questions or concerns.   Review/collaboration with Dr. Lindi Adie regarding all aspects of patient's visit today.   Total time spent with patient was 25 minutes;  with greater than 80 percent of that time spent in face to face counseling regarding her symptoms, frequent monitoring of patient while in the infusion center and coordination of care and follow up.  Disclaimer: This note was dictated with voice recognition software. Similar sounding words can inadvertently be transcribed and may not be corrected upon review.   Drue Second, NP 10/22/2014

## 2014-10-22 NOTE — Therapy (Signed)
[facility] [place], [place], [address] Phone: [phone]   Fax:  [phone]  Physical Therapy Treatment  Patient Details  Name: Tina Vaughan MRN: [id number] Date of Birth: [birth year]  Encounter Date: 10/21/2014      PT End of Session - 10/22/14 1810    Visit Number 4   Number of Visits 8   Date for PT Re-Evaluation 11/12/14   PT Start Time 1430   PT Stop Time 1515   PT Time Calculation (min) 45 min      Past Medical History  Diagnosis Date  . Frequent urination   . Depression   . Sleep apnea   . Eczema   . Neuromuscular disorder   . GERD (gastroesophageal reflux disease)   . 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 Breast cancer, stage 3 2012    left  . Lymphedema     Past Surgical History  Procedure Laterality Date  . Breast surgery  05/12/11    total mastectomy on right, radical mastectomy on left  . Portacath placement    . Laparoscopy  03/14/2012    Procedure: LAPAROSCOPY OPERATIVE;  Surgeon: Osborne Oman, MD;  Location: [facility];  Service: Gynecology;  Laterality: N/A;  . Salpingoophorectomy  03/14/2012    Procedure: SALPINGO OOPHERECTOMY;  Surgeon: Osborne Oman, MD;  Location: [facility];  Service: Gynecology;  Laterality: Bilateral;  . Port-a-cath removal  04/19/2012    Procedure: MINOR REMOVAL PORT-A-CATH;  Surgeon: Haywood Lasso, MD;  Location: [facility];  Service: General;  Laterality: N/A;    There were no vitals taken for this visit.  Visit Diagnosis:  Post-mastectomy lymphedema syndrome  Lymphedema of upper extremity following lymphadenectomy  Physical deconditioning      Subjective Assessment - 10/21/14 1435    Symptoms Feeling so much better, pt reports she has been in ED and oncologist's office and recieved 3 bags of fluid, but she feels so much better today   Currently in Pain? No/denies              PT Education - 10/22/14 1815    Education  provided Yes   Education Details introduction to [employer] program   Person(s) Educated Patient   Methods Explanation   Comprehension Verbalized understanding              Plan - 10/22/14 1810    Clinical Impression Statement Pt has been without compression bandaging for several days and has not significantly increased in circumferene.  she feels that she will be ok to just wear compression garments to manage her edema.,  Very interested in strength ABC program and was attentive and demonstrated understanding of  introductory information   PT Next Visit Plan instruct and practice strenght ABC program and log completion              LYMPHEDEMA/ONCOLOGY QUESTIONNAIRE - 10/21/14 1438    Left Upper Extremity Lymphedema   15 cm Proximal to Olecranon Process 38.2 cm   10 cm Proximal to Olecranon Process 37.7 cm   Olecranon Process 29 cm   15 cm Proximal to Ulnar Styloid Process 30 cm   10 cm Proximal to Ulnar Styloid Process 28.1 cm   Just Proximal to Ulnar Styloid Process 17.4 cm   Across Hand at [employer] 20.2 cm   At [place] of 2nd Digit 6.5 cm  Short Term Clinic Goals - 10/22/14 1815    CC Short Term Goal  #1   Title short term goals = long term goals         Long Term Clinic Goals - 10/22/14 1816    CC Long Term Goal  #1   Title pt will have an understanding of lymphedema risk reduction practices   Time 4   Period Weeks   Status Achieved   CC Long Term Goal  #2   Title pt will be able to perform self manual lymph drainage and use compression for self management of lymphedema in  her left upper extremity   Time 4   Period Weeks   Status Achieved   CC Long Term Goal  #3   Title pt will have reducition of circumferential measurement of left arm by  1.0 cm at 10 cm above the ulnar styloid   Time 4   Period Weeks   Status Not Met   CC Long Term Goal  #4   Title pt will verbalize understanding of a strength exercise program for her upper  extremitites   Time 4   Period Weeks   Status On-going   CC Long Term Goal  #5   Title Patient will have a reduction in Quick Dash score by 5 points   Time 4   Period Weeks   Status On-going         Problem List Patient Active Problem List   Diagnosis Date Noted  . Headache 10/22/2014  . Lymphedema of upper extremity 09/11/2014  . Status post prophylactic bilateral salpingo-oophorectomy for BRCA 03/19/2012  . Breast cancer, stage 3 05/18/2011   Donato Heinz. Owens Shark, PT   10/22/2014, 6:17 PM

## 2014-10-22 NOTE — Patient Instructions (Signed)
Concepts and components of progressive strength training with attention to lymphatic arm load and symptoms in total lymphedema managagement

## 2014-10-22 NOTE — Assessment & Plan Note (Signed)
Patient is status post bilateral mastectomy.  She completed radiation therapy in June 2012.  She is currently undergoing anastrozole therapy on a daily basis.  She is scheduled to return for a follow-up visit on 03/04/2015.

## 2014-10-22 NOTE — Telephone Encounter (Signed)
This RN spoke with patient regarding headache from 10/20/14. Patient stated, "whatever Cyndee gave me did the trick. My headache is gone." I instructed patient to call [facility] if the headache came back or she had any further questions. Patient verbalized understanding.

## 2014-10-22 NOTE — Assessment & Plan Note (Signed)
Patient just recently developed some left arm lymphedema.  I she has been diligently going to the lymphedema clinic and wearing her lymphedema sleeve on a fairly regular basis.  Patient has minimal left arm lymphedema today on exam.  However, patient did not wear her compression sleeve today; stating that she felt too bad to where it today.  Patient does have some lymphedema clinic appointments already scheduled.

## 2014-10-26 ENCOUNTER — Ambulatory Visit: Payer: Medicare Other | Admitting: Physical Therapy

## 2014-10-28 ENCOUNTER — Ambulatory Visit: Payer: Medicare Other

## 2014-10-28 DIAGNOSIS — I890 Lymphedema, not elsewhere classified: Secondary | ICD-10-CM | POA: Diagnosis not present

## 2014-10-28 DIAGNOSIS — E8989 Other postprocedural endocrine and metabolic complications and disorders: Secondary | ICD-10-CM

## 2014-10-28 DIAGNOSIS — I972 Postmastectomy lymphedema syndrome: Secondary | ICD-10-CM

## 2014-10-28 DIAGNOSIS — R5381 Other malaise: Secondary | ICD-10-CM

## 2014-10-28 DIAGNOSIS — Z5189 Encounter for other specified aftercare: Secondary | ICD-10-CM | POA: Diagnosis not present

## 2014-10-28 DIAGNOSIS — C50919 Malignant neoplasm of unspecified site of unspecified female breast: Secondary | ICD-10-CM | POA: Diagnosis not present

## 2014-10-28 NOTE — Therapy (Signed)
[facility] [place], [place], [address] Phone: [phone]   Fax:  [phone]  Physical Therapy Treatment  Patient Details  Name: Tina Vaughan MRN: [id number] Date of Birth: October 09, 1961  Encounter Date: 10/28/2014      PT End of Session - 10/28/14 1507    Visit Number 5   Number of Visits 8   Date for PT Re-Evaluation 11/12/14   PT Start Time 5747   PT Stop Time 1519   PT Time Calculation (min) 42 min      Past Medical History  Diagnosis Date  . Frequent urination   . Depression   . Sleep apnea   . Eczema   . Neuromuscular disorder   . GERD (gastroesophageal reflux disease)   . 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 Breast cancer, stage 3 2012    left  . Lymphedema     Past Surgical History  Procedure Laterality Date  . Breast surgery  05/12/11    total mastectomy on right, radical mastectomy on left  . Portacath placement    . Laparoscopy  03/14/2012    Procedure: LAPAROSCOPY OPERATIVE;  Surgeon: Osborne Oman, MD;  Location: [facility];  Service: Gynecology;  Laterality: N/A;  . Salpingoophorectomy  03/14/2012    Procedure: SALPINGO OOPHERECTOMY;  Surgeon: Osborne Oman, MD;  Location: [facility];  Service: Gynecology;  Laterality: Bilateral;  . Port-a-cath removal  04/19/2012    Procedure: MINOR REMOVAL PORT-A-CATH;  Surgeon: Haywood Lasso, MD;  Location: [facility];  Service: General;  Laterality: N/A;    There were no vitals taken for this visit.  Visit Diagnosis:  Post-mastectomy lymphedema syndrome  Lymphedema of upper extremity following lymphadenectomy  Physical deconditioning      Subjective Assessment - 10/28/14 1443    Symptoms My swelling is doing good, feel like I have that under control since I've been coming here. Ready to learn the exercise program.   Patient Stated Goals Now to learn safe exercise program.   Currently in Pain? No/denies             [facility] Adult PT Treatment/Exercise - 10/28/14 0001    Shoulder Exercises: Standing   Other Standing Exercises Stretches from Strength ABC Program: Chest, Shoulder, Tricep, Calf, Quadriceps, Seated for figure 4 piriformis figure 4 and hamstring stretches and supine for low trunk rotation all 1 rep, for 15 sec   Other Standing Exercises Strengthening exercises from ABC Program: Supine Bridging and chest press, squats with 1# lifts, "W" with 1#, Bil hip abduction with 1 UE support, Bil Scaption 1#, , Tricep kickbacks and bicep curls with 1#, calf raises and front step ups 6" all 1 set of 10 reps.          PT Education - 10/28/14 1505    Education provided Yes   Education Details Strength ABC Program   Person(s) Educated Patient   Methods Explanation;Demonstration;Tactile cues;Handout;Verbal cues   Comprehension Verbalized understanding;Returned demonstration;Verbal cues required;Need further instruction              Plan - 10/28/14 1508    Clinical Impression Statement Pt had a good understanding of correct technique with exercises today, and verbalized good understanding of purpose of pacing herself with exercises to avoid increase lymphatic load.   Pt will benefit from skilled therapeutic intervention in order to improve on the following deficits Increased edema   Rehab Potential Excellent   PT  Frequency 2x / week   PT Duration 4 weeks   PT Treatment/Interventions Therapeutic exercise;Compression bandaging;Scar mobilization;Manual lymph drainage;Patient/family education   PT Next Visit Plan Review HEP issued today and answer any questions with pt.   PT Home Exercise Plan Strength ABC Program   Consulted and Agree with Plan of Care Patient                              Long Term Clinic Goals - 10/28/14 1520    CC Long Term Goal  #4   Title pt will verbalize understanding of a strength exercise program for her upper extremitites   Time 4   Period Weeks    Status Partially Met         Problem List Patient Active Problem List   Diagnosis Date Noted  . Headache 10/22/2014  . Lymphedema of upper extremity 09/11/2014  . Status post prophylactic bilateral salpingo-oophorectomy for BRCA 03/19/2012  . Breast cancer, stage 3 05/18/2011    Otelia Limes, PTA 10/28/2014, 3:21 PM

## 2014-11-02 ENCOUNTER — Ambulatory Visit: Payer: Medicare Other | Admitting: Physical Therapy

## 2014-11-02 DIAGNOSIS — I890 Lymphedema, not elsewhere classified: Secondary | ICD-10-CM

## 2014-11-02 DIAGNOSIS — E8989 Other postprocedural endocrine and metabolic complications and disorders: Secondary | ICD-10-CM

## 2014-11-02 DIAGNOSIS — R5381 Other malaise: Secondary | ICD-10-CM

## 2014-11-02 DIAGNOSIS — Z5189 Encounter for other specified aftercare: Secondary | ICD-10-CM | POA: Diagnosis not present

## 2014-11-02 DIAGNOSIS — C50919 Malignant neoplasm of unspecified site of unspecified female breast: Secondary | ICD-10-CM | POA: Diagnosis not present

## 2014-11-03 NOTE — Therapy (Signed)
[place], [place], [address] Phone: [phone]   Fax:  [phone]  Physical Therapy Treatment  Patient Details  Name: Tina Vaughan MRN: [id number] Date of Birth: Dec 06, 1960  Encounter Date: 11/02/2014      PT End of Session - 11/03/14 0733    Visit Number 6   Number of Visits 8   Date for PT Re-Evaluation 11/12/14   PT Start Time 3532   PT Stop Time 1520   PT Time Calculation (min) 47 min      Past Medical History  Diagnosis Date  . Frequent urination   . Depression   . Sleep apnea   . Eczema   . Neuromuscular disorder   . GERD (gastroesophageal reflux disease)   . 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 Breast cancer, stage 3 2012    left  . Lymphedema     Past Surgical History  Procedure Laterality Date  . Breast surgery  05/12/11    total mastectomy on right, radical mastectomy on left  . Portacath placement    . Laparoscopy  03/14/2012    Procedure: LAPAROSCOPY OPERATIVE;  Surgeon: Osborne Oman, MD;  Location: [facility];  Service: Gynecology;  Laterality: N/A;  . Salpingoophorectomy  03/14/2012    Procedure: SALPINGO OOPHERECTOMY;  Surgeon: Osborne Oman, MD;  Location: [facility];  Service: Gynecology;  Laterality: Bilateral;  . Port-a-cath removal  04/19/2012    Procedure: MINOR REMOVAL PORT-A-CATH;  Surgeon: Haywood Lasso, MD;  Location: [facility];  Service: General;  Laterality: N/A;    There were no vitals taken for this visit.  Visit Diagnosis:  Physical deconditioning  Lymphedema of upper extremity following lymphadenectomy      Subjective Assessment - 11/02/14 1435    Symptoms Practiced the exercise and pt. feel that went okay.  Swelling still okay.  Pt. wearing sleeve but not glove today.   Currently in Pain? No/denies                    [facility] Adult PT Treatment/Exercise - 11/03/14 0001    Shoulder Exercises: Standing   Other Standing Exercises Reviewed entire ABC strengthening program including stretches for UEs and LEs bilat. as per program protocol   Other Standing Exercises ABC strengthening program for UEs and LEs as per that program protocol all performed.                PT Education - 11/03/14 [phone]    Education provided Yes   Education Details Review of Strength ABC program.   Person(s) Educated Patient   Methods Explanation;Demonstration;Verbal cues   Comprehension Returned demonstration;Verbalized understanding           Short Term Clinic Goals - 10/22/14 1815    CC Short Term Goal  #1   Title short term goals = long term goals             Long Term Clinic Goals - 11/03/14 0736    CC Long Term Goal  #4   Status Achieved            Plan - 11/03/14 0734    Clinical Impression Statement Pt. performed all of strength ABC program (stretching and strengthening) today; she used good form and performed things well, but did need cueing for hold times and repetitions, as well as some cueing for technique.   PT Next Visit Plan Assess next.  Doing well with HEP.  Determine further needs related to goals set at initial evaluation.        Problem List Patient Active Problem List   Diagnosis Date Noted  . Headache 10/22/2014  . Lymphedema of upper extremity 09/11/2014  . Status post prophylactic bilateral salpingo-oophorectomy for BRCA 03/19/2012  . Breast cancer, stage 3 05/18/2011    SALISBURY,DONNA 11/03/2014, 7:37 AM  [place], [place], [address] Phone: [phone]   Fax:  [phone]   Serafina Royals, [place]

## 2014-11-04 ENCOUNTER — Ambulatory Visit: Payer: Medicare Other | Admitting: Physical Therapy

## 2014-11-09 ENCOUNTER — Ambulatory Visit: Payer: Medicare Other | Admitting: Physical Therapy

## 2014-11-09 ENCOUNTER — Encounter: Payer: Self-pay | Admitting: Physical Therapy

## 2014-11-09 NOTE — Therapy (Signed)
PHYSICAL THERAPY DISCHARGE SUMMARY  Visits from Start of Care: 6  Current functional level related to goals / functional outcomes:  CC Long Term Goal #1    Title  pt will have an understanding of lymphedema risk reduction practices    Time  4    Period  Weeks    Status  Achieved    CC Long Term Goal #2    Title  pt will be able to perform self manual lymph drainage and use compression for self management of lymphedema in her left upper extremity    Time  4    Period  Weeks    Status  Achieved    CC Long Term Goal #3    Title  pt will have reducition of circumferential measurement of left arm by 1.0 cm at 10 cm above the ulnar styloid    Time  4    Period  Weeks    Status  Not Met    CC Long Term Goal #4    Title  pt will verbalize understanding of a strength exercise program for her upper extremitites    Time  4    Period  Weeks    Status  Achieved    CC Long Term Goal #5    Title  Patient will have a reduction in Quick Dash score by 5 points    Time  4    Period  Weeks    Status  On-going        10/28/14 1520    CC Long Term Goal #4    Title  pt will verbalize understanding of a strength exercise program for her upper extremitites    Time  4    Period  Weeks    Status  Partially Met        11/03/14 0736    CC Long Term Goal #4    Status  Achieved         Remaining deficits: Pt. will need to continue home exercise program and lymphedema management.   Education / Equipment: Home exercise program  Plan: Patient agrees to discharge.  Patient goals were partially met. Patient is being discharged due to being pleased with the current functional level.  ?????   The patient called today and asked to cancel all scheduled appointments, including one today.        [place], [place], [address] Phone:  [phone]   Fax:  [phone]  Patient Details  Name: Tina Vaughan MRN: [id number] Date of Birth: [birth year]  Encounter Date: 11/09/2014  [facility] 11/09/2014, 8:55 AM  [place], [place], [address] Phone: [phone]   Fax:  [phone]

## 2014-11-11 ENCOUNTER — Ambulatory Visit: Payer: Medicare Other

## 2015-02-09 ENCOUNTER — Ambulatory Visit: Payer: Medicare Other | Admitting: Hematology and Oncology

## 2015-03-04 ENCOUNTER — Ambulatory Visit: Payer: Medicare Other | Admitting: Hematology and Oncology

## 2015-05-14 ENCOUNTER — Telehealth: Payer: Self-pay | Admitting: Hematology and Oncology

## 2015-05-14 ENCOUNTER — Other Ambulatory Visit: Payer: Self-pay

## 2015-05-14 DIAGNOSIS — C50919 Malignant neoplasm of unspecified site of unspecified female breast: Secondary | ICD-10-CM

## 2015-05-14 NOTE — Progress Notes (Signed)
Returned pt. Call Pt reports a lot of nausea over the past 2-3 weeks.  Reports headache in left temple, dull pain, only when she lies down.  Pt reports occassional blinking in her left eye.  Pt reports history of migraines.  Pt is concerned about cancer recurrence.   Appt made with [facility] for Tuesday 7/5 - pt voiced understanding.  Pof sent, lab orders placed.  Inbasket sent to [employer]

## 2015-05-14 NOTE — Telephone Encounter (Signed)
Added lab/CB for 7/5 @ 10 am. Date/time/patient aware per 05/14/15 pof.

## 2015-05-18 ENCOUNTER — Encounter: Payer: Self-pay | Admitting: Nurse Practitioner

## 2015-05-18 ENCOUNTER — Ambulatory Visit (HOSPITAL_BASED_OUTPATIENT_CLINIC_OR_DEPARTMENT_OTHER): Payer: Medicare Other | Admitting: Nurse Practitioner

## 2015-05-18 ENCOUNTER — Encounter (HOSPITAL_COMMUNITY): Payer: Self-pay

## 2015-05-18 ENCOUNTER — Telehealth: Payer: Self-pay | Admitting: Nurse Practitioner

## 2015-05-18 ENCOUNTER — Ambulatory Visit (HOSPITAL_COMMUNITY)
Admission: RE | Admit: 2015-05-18 | Discharge: 2015-05-18 | Disposition: A | Discharge status: Home / Self Care | Payer: Medicare Other | Source: Ambulatory Visit | Attending: Nurse Practitioner | Admitting: Nurse Practitioner

## 2015-05-18 ENCOUNTER — Other Ambulatory Visit (HOSPITAL_BASED_OUTPATIENT_CLINIC_OR_DEPARTMENT_OTHER): Payer: Medicare Other

## 2015-05-18 ENCOUNTER — Telehealth: Payer: Self-pay | Admitting: Hematology and Oncology

## 2015-05-18 VITALS — BP 119/93 | HR 95 | Temp 98.5°F | Resp 18 | Wt 238.1 lb

## 2015-05-18 DIAGNOSIS — R51 Headache: Secondary | ICD-10-CM | POA: Diagnosis not present

## 2015-05-18 DIAGNOSIS — C50919 Malignant neoplasm of unspecified site of unspecified female breast: Secondary | ICD-10-CM

## 2015-05-18 DIAGNOSIS — Z9221 Personal history of antineoplastic chemotherapy: Secondary | ICD-10-CM | POA: Diagnosis not present

## 2015-05-18 DIAGNOSIS — R519 Headache, unspecified: Secondary | ICD-10-CM

## 2015-05-18 DIAGNOSIS — R110 Nausea: Secondary | ICD-10-CM | POA: Diagnosis not present

## 2015-05-18 DIAGNOSIS — Z853 Personal history of malignant neoplasm of breast: Secondary | ICD-10-CM | POA: Insufficient documentation

## 2015-05-18 LAB — COMPREHENSIVE METABOLIC PANEL (CC13)
ALK PHOS: 72 U/L (ref 40–150)
ALT: 22 U/L (ref 0–55)
ANION GAP: 9 meq/L (ref 3–11)
AST: 17 U/L (ref 5–34)
Albumin: 4 g/dL (ref 3.5–5.0)
BILIRUBIN TOTAL: 0.51 mg/dL (ref 0.20–1.20)
BUN: 13.6 mg/dL (ref 7.0–26.0)
CALCIUM: 9.7 mg/dL (ref 8.4–10.4)
CO2: 27 mEq/L (ref 22–29)
CREATININE: 0.8 mg/dL (ref 0.6–1.1)
Chloride: 106 mEq/L (ref 98–109)
EGFR: 90 mL/min/{1.73_m2} — ABNORMAL LOW (ref >90)
GLUCOSE: 113 mg/dL (ref 70–140)
Potassium: 4 mEq/L (ref 3.5–5.1)
Sodium: 141 mEq/L (ref 136–145)
Total Protein: 7.2 g/dL (ref 6.4–8.3)

## 2015-05-18 LAB — CBC WITH DIFFERENTIAL/PLATELET
BASO%: 0.4 % (ref 0.0–2.0)
Basophils Absolute: 0 10*3/uL (ref 0.0–0.1)
EOS ABS: 0.1 10*3/uL (ref 0.0–0.5)
EOS%: 1.3 % (ref 0.0–7.0)
HCT: 41.8 % (ref 34.8–46.6)
HGB: 14.2 g/dL (ref 11.6–15.9)
LYMPH%: 17.6 % (ref 14.0–49.7)
MCH: 29.8 pg (ref 25.1–34.0)
MCHC: 34 g/dL (ref 31.5–36.0)
MCV: 87.7 fL (ref 79.5–101.0)
MONO#: 0.4 10*3/uL (ref 0.1–0.9)
MONO%: 6.5 % (ref 0.0–14.0)
NEUT%: 74.2 % (ref 38.4–76.8)
NEUTROS ABS: 4.8 10*3/uL (ref 1.5–6.5)
Platelets: 227 10*3/uL (ref 145–400)
RBC: 4.76 10*6/uL (ref 3.70–5.45)
RDW: 13.3 % (ref 11.2–14.5)
WBC: 6.4 10*3/uL (ref 3.9–10.3)
lymph#: 1.1 10*3/uL (ref 0.9–3.3)

## 2015-05-18 MED ORDER — ONDANSETRON HCL 4 MG PO TABS: 4.0000 mg | ORAL_TABLET | Freq: Three times a day (TID) | ORAL | Status: DC | PRN

## 2015-05-18 NOTE — Assessment & Plan Note (Signed)
Patient has a history of chronic headaches for the past several years.  Patient states that she has been diagnosed with "hormonal headaches in the past."  Patient states that she frequently has migraine-type headaches; but her headache has changed within the past 3 weeks.  She now states she develops a headache typically when she lies down flat.  She states that the headache usually resolves when she awakens in the morning.  She also complains of some chronic nausea; but no vomiting.  She denies any vision changes; but does note 2 different episodes of questionable floaters/bright lights to her left eye for a few seconds each time.  Patient wears glasses on a regular basis; and states that she has not followed up with her eye doctor for greater than one year.  Patient denies any other neurological symptoms whatsoever.  On exam today.  Patient appears comfortable and nontoxic.  Patient states that she is headache free at this moment.  She does continue to complain of chronic nausea; and states that she needs a refill of her Zofran today.  Lung sounds clear bilaterally.  Pupils equal, round, and reactive to light.  Patient obtained a brain MRI with initial staging for her breast cancer in 2012.  Brain MRA was essentially normal; with no acute findings at that time.  Since patient's headache symptoms have changed within the past few weeks.-Will order a head CT for further evaluation.  Gave patient a refill of her Zofran today.  Patient states that she does not have a primary care physician.  Advised patient would call her back as soon as the head CT results are available.  Gave patient printed handouts of available primary care physicians in the local area.  Advised patient that she would need to follow up with a primary care physician to establish care and manage her chronic headache symptoms; but will gladly assist if there is any acute findings on the head CT today.  Patient stated understanding of all  instructions; was in agreement with this plan of care.

## 2015-05-18 NOTE — Telephone Encounter (Signed)
Had given patient a copy of her labs from today; as well as printed material regarding local primary care physician offices.  All of her paperwork was found at the [employer] table near the [place] lobby.  Called patient and left message relating that all of her paperwork was found and will be available for her to pick back up at the front lobby desk.

## 2015-05-18 NOTE — Progress Notes (Signed)
will   SYMPTOM MANAGEMENT CLINIC   HPI: Tina Vaughan 54 y.o. female diagnosed with breast cancer.  Patient status post bilateral mastectomy and radiation therapy completed in June 2012.  Patient reports that she discontinued anastrozole therapy approximately 6 months ago; stating that she was started taking medications.  Patient has a history of chronic headaches for the past several years.  Patient states that she has been diagnosed with "hormonal headaches in the past."  Patient states that she frequently has migraine-type headaches; but her headache has changed within the past 3 weeks.  She now states she develops a headache typically when she lies down flat.  She states that the headache usually resolves when she awakens in the morning.  She also complains of some chronic nausea; but no vomiting.  She denies any vision changes; but does note 2 different episodes of questionable floaters/bright lights to her left eye for a few seconds each time.  Patient wears glasses on a regular basis; and states that she has not followed up with her eye doctor for greater than one year.  Patient denies any other neurological symptoms whatsoever.   Headache      Review of Systems  Neurological: Positive for headaches.    Past Medical History  Diagnosis Date  . Frequent urination   . Depression   . Sleep apnea   . Eczema   . Neuromuscular disorder   . GERD (gastroesophageal reflux disease)   . 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 Breast cancer, stage 3 2012    left  . Lymphedema     Past Surgical History  Procedure Laterality Date  . Breast surgery  05/12/11    total mastectomy on right, radical mastectomy on left  . Portacath placement    . Laparoscopy  03/14/2012    Procedure: LAPAROSCOPY OPERATIVE;  Surgeon: Osborne Oman, MD;  Location: [facility];  Service: Gynecology;  Laterality: N/A;  . Salpingoophorectomy  03/14/2012    Procedure: SALPINGO OOPHERECTOMY;  Surgeon: Osborne Oman, MD;  Location: [facility];  Service: Gynecology;  Laterality: Bilateral;  . Port-a-cath removal  04/19/2012    Procedure: MINOR REMOVAL PORT-A-CATH;  Surgeon: Haywood Lasso, MD;  Location: [facility];  Service: General;  Laterality: N/A;    has Breast cancer, stage 3; Status post prophylactic bilateral salpingo-oophorectomy for BRCA; Lymphedema of upper extremity; and Headache on her problem list.     is allergic to doxycycline hydrochloride.    Medication List       This list is accurate as of: 05/18/15 10:59 AM.  Always use your most recent med list.               ALPRAZolam 0.5 MG tablet  Commonly known as:  XANAX  Take 1 tablet (0.5 mg total) by mouth at bedtime as needed for sleep.     anastrozole 1 MG tablet  Commonly known as:  ARIMIDEX  Take 1 tablet (1 mg total) by mouth daily.     Calcium 200 MG Tabs  Take 200 mg by mouth daily.     cholecalciferol 1000 UNITS tablet  Commonly known as:  VITAMIN D  Take 1,000 Units by mouth daily.     citalopram 40 MG tablet  Commonly known as:  CELEXA  TAKE 1 TABLET BY MOUTH DAILY.     fluocinonide cream 0.05 %  Commonly known as:  LIDEX  Apply 1 application topically 2 (  two) times daily as needed (for rash.).     gabapentin 300 MG capsule  Commonly known as:  NEURONTIN  Take 1 capsule (300 mg total) by mouth daily.     ibuprofen 200 MG tablet  Commonly known as:  ADVIL,MOTRIN  Take 600 mg by mouth every 8 (eight) hours as needed for moderate pain (pain).     naproxen sodium 220 MG tablet  Commonly known as:  ANAPROX  Take 220 mg by mouth 2 (two) times daily as needed (for muscle aches.).     omeprazole 20 MG capsule  Commonly known as:  PRILOSEC  Take 1 capsule (20 mg total) by mouth daily.     ondansetron 4 MG tablet  Commonly known as:  ZOFRAN  Take 1 tablet (4 mg total) by mouth every 8 (eight) hours as needed for nausea or vomiting.     prochlorperazine 10 MG tablet  Commonly known  as:  COMPAZINE  Take 10 mg by mouth every 6 (six) hours as needed (for nausea.).     promethazine 25 MG tablet  Commonly known as:  PHENERGAN  Take 1 tablet (25 mg total) by mouth every 6 (six) hours as needed for nausea.     UNABLE TO FIND  Dispense compression sleeve 20-30 mm for this patient with history of breast cancer s/p surgery         PHYSICAL EXAMINATION  Blood pressure 119/93, pulse 95, temperature 98.5 F (36.9 C), temperature source Oral, resp. rate 18, weight 238 lb 1.6 oz (108.001 kg), SpO2 98 %.  Physical Exam  Constitutional: She is oriented to person, place, and time and well-developed, well-nourished, and in no distress.  HENT:  Head: Normocephalic and atraumatic.  Right Ear: External ear normal.  Left Ear: External ear normal.  Mouth/Throat: Oropharynx is clear and moist.  Eyes: Conjunctivae and EOM are normal. Pupils are equal, round, and reactive to light. Right eye exhibits no discharge. Left eye exhibits no discharge. No scleral icterus.  Neck: Normal range of motion. Neck supple. No JVD present. No tracheal deviation present. No thyromegaly present.  Cardiovascular: Normal rate, regular rhythm, normal heart sounds and intact distal pulses.   Pulmonary/Chest: Effort normal and breath sounds normal. No respiratory distress. She has no wheezes. She has no rales.  Abdominal: Soft. Bowel sounds are normal. She exhibits no distension and no mass. There is no tenderness. There is no rebound and no guarding.  Musculoskeletal: Normal range of motion. She exhibits no edema or tenderness.  Trace left arm lymphedema only.  Patient is not wearing a compression sleeve today.  Lymphadenopathy:    She has no cervical adenopathy.  Neurological: She is alert and oriented to person, place, and time. Gait normal.  Skin: Skin is warm and dry. No rash noted. No erythema.  Psychiatric: Affect normal.  Nursing note and vitals reviewed.   LABORATORY DATA:. Appointment on  05/18/2015  Component Date Value Ref Range Status  . WBC 05/18/2015 6.4  3.9 - 10.3 10e3/uL Final  . NEUT# 05/18/2015 4.8  1.5 - 6.5 10e3/uL Final  . HGB 05/18/2015 14.2  11.6 - 15.9 g/dL Final  . HCT 05/18/2015 41.8  34.8 - 46.6 % Final  . Platelets 05/18/2015 227  145 - 400 10e3/uL Final  . MCV 05/18/2015 87.7  79.5 - 101.0 fL Final  . MCH 05/18/2015 29.8  25.1 - 34.0 pg Final  . MCHC 05/18/2015 34.0  31.5 - 36.0 g/dL Final  . RBC 05/18/2015 4.76  3.70 - 5.45 10e6/uL Final  . RDW 05/18/2015 13.3  11.2 - 14.5 % Final  . lymph# 05/18/2015 1.1  0.9 - 3.3 10e3/uL Final  . MONO# 05/18/2015 0.4  0.1 - 0.9 10e3/uL Final  . Eosinophils Absolute 05/18/2015 0.1  0.0 - 0.5 10e3/uL Final  . Basophils Absolute 05/18/2015 0.0  0.0 - 0.1 10e3/uL Final  . NEUT% 05/18/2015 74.2  38.4 - 76.8 % Final  . LYMPH% 05/18/2015 17.6  14.0 - 49.7 % Final  . MONO% 05/18/2015 6.5  0.0 - 14.0 % Final  . EOS% 05/18/2015 1.3  0.0 - 7.0 % Final  . BASO% 05/18/2015 0.4  0.0 - 2.0 % Final  . Sodium 05/18/2015 141  136 - 145 mEq/L Final  . Potassium 05/18/2015 4.0  3.5 - 5.1 mEq/L Final  . Chloride 05/18/2015 106  98 - 109 mEq/L Final  . CO2 05/18/2015 27  22 - 29 mEq/L Final  . Glucose 05/18/2015 113  70 - 140 mg/dl Final  . BUN 15/95/3967 13.6  7.0 - 26.0 mg/dL Final  . Creatinine 28/97/9150 0.8  0.6 - 1.1 mg/dL Final  . Total Bilirubin 05/18/2015 0.51  0.20 - 1.20 mg/dL Final  . Alkaline Phosphatase 05/18/2015 72  40 - 150 U/L Final  . AST 05/18/2015 17  5 - 34 U/L Final  . ALT 05/18/2015 22  0 - 55 U/L Final  . Total Protein 05/18/2015 7.2  6.4 - 8.3 g/dL Final  . Albumin 41/36/4383 4.0  3.5 - 5.0 g/dL Final  . Calcium 77/93/9688 9.7  8.4 - 10.4 mg/dL Final  . Anion Gap 64/84/7207 9  3 - 11 mEq/L Final  . EGFR 05/18/2015 90* >90 ml/min/1.73 m2 Final   eGFR is calculated using the CKD-EPI Creatinine Equation (2009)     RADIOGRAPHIC STUDIES: No results found.  ASSESSMENT/PLAN:    Breast cancer, stage  3 Patient is status post bilateral mastectomy.  She completed radiation therapy in June 2012.  Patient stopped taking the daily Anastrozole as directed approximately 6 months ago.  She states that she was tired of taking medications.  Patient obtained labs today; which were completely within normal limits.  Patient will need to be scheduled for a follow-up visit with Dr. Pamelia Hoit within the next month or so.  Please call patient to arrange.   Headache Patient has a history of chronic headaches for the past several years.  Patient states that she has been diagnosed with "hormonal headaches in the past."  Patient states that she frequently has migraine-type headaches; but her headache has changed within the past 3 weeks.  She now states she develops a headache typically when she lies down flat.  She states that the headache usually resolves when she awakens in the morning.  She also complains of some chronic nausea; but no vomiting.  She denies any vision changes; but does note 2 different episodes of questionable floaters/bright lights to her left eye for a few seconds each time.  Patient wears glasses on a regular basis; and states that she has not followed up with her eye doctor for greater than one year.  Patient denies any other neurological symptoms whatsoever.  On exam today.  Patient appears comfortable and nontoxic.  Patient states that she is headache free at this moment.  She does continue to complain of chronic nausea; and states that she needs a refill of her Zofran today.  Lung sounds clear bilaterally.  Pupils equal, round, and reactive to light.  Patient obtained a brain MRI with initial staging for her breast cancer in 2012.  Brain MRA was essentially normal; with no acute findings at that time.  Since patient's headache symptoms have changed within the past few weeks.-Will order a head CT for further evaluation.  Gave patient a refill of her Zofran today.  Patient states that she does not  have a primary care physician.  Advised patient would call her back as soon as the head CT results are available.  Gave patient printed handouts of available primary care physicians in the local area.  Advised patient that she would need to follow up with a primary care physician to establish care and manage her chronic headache symptoms; but will gladly assist if there is any acute findings on the head CT today.  Patient stated understanding of all instructions; was in agreement with this plan of care.    Patient stated understanding of all instructions; and was in agreement with this plan of care. The patient knows to call the clinic with any problems, questions or concerns.   Review/collaboration with Dr. Lindi Adie regarding all aspects of patient's visit today.   Total time spent with patient was 25 minutes;  with greater than 80 percent of that time spent in face to face counseling regarding her symptoms, frequent monitoring of patient while in the infusion center and coordination of care and follow up.  Disclaimer: This note was dictated with voice recognition software. Similar sounding words can inadvertently be transcribed and may not be corrected upon review.   Drue Second, NP 05/18/2015

## 2015-05-18 NOTE — Telephone Encounter (Signed)
Confirmed appointment for 07/19

## 2015-05-18 NOTE — Assessment & Plan Note (Signed)
Patient is status post bilateral mastectomy.  She completed radiation therapy in June 2012.  Patient stopped taking the daily Anastrozole as directed approximately 6 months ago.  She states that she was tired of taking medications.  Patient obtained labs today; which were completely within normal limits.  Patient will need to be scheduled for a follow-up visit with Dr. Lindi Adie within the next month or so.  Please call patient to arrange.

## 2015-05-18 NOTE — Telephone Encounter (Signed)
Called patient back to review normal head CT results.  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  Indeterminate if this is a new or old finding.  Patient states that she has no history of thyroid disease in the past.  Patient states that she has plans to call and obtain an appointment with a primary care physician later this afternoon.  Advised that patient follow up regarding further thyroid labs and studies.  Also advised patient to make sure she does obtain an ophthalmologist appointment as well.  Patient stated understanding of all instructions; and was in agreement.  The splenic care.

## 2015-05-20 ENCOUNTER — Telehealth: Payer: Self-pay | Admitting: *Deleted

## 2015-05-20 ENCOUNTER — Encounter: Payer: Self-pay | Admitting: *Deleted

## 2015-05-20 NOTE — Telephone Encounter (Signed)
Pre-Visit Call completed with patient and chart updated.   Pre-Visit Info documented in Specialty Comments under [employer].    

## 2015-05-21 ENCOUNTER — Ambulatory Visit (INDEPENDENT_AMBULATORY_CARE_PROVIDER_SITE_OTHER): Payer: Medicare Other | Admitting: Medical

## 2015-05-21 ENCOUNTER — Encounter: Payer: Self-pay | Admitting: Medical

## 2015-05-21 VITALS — BP 130/89 | HR 102 | Temp 98.7°F | Ht 62.0 in | Wt 236.6 lb

## 2015-05-21 DIAGNOSIS — G4730 Sleep apnea, unspecified: Secondary | ICD-10-CM | POA: Insufficient documentation

## 2015-05-21 DIAGNOSIS — R519 Headache, unspecified: Secondary | ICD-10-CM

## 2015-05-21 DIAGNOSIS — R5383 Other fatigue: Secondary | ICD-10-CM | POA: Diagnosis not present

## 2015-05-21 DIAGNOSIS — C50919 Malignant neoplasm of unspecified site of unspecified female breast: Secondary | ICD-10-CM

## 2015-05-21 DIAGNOSIS — R51 Headache: Secondary | ICD-10-CM

## 2015-05-21 DIAGNOSIS — Z1211 Encounter for screening for malignant neoplasm of colon: Secondary | ICD-10-CM

## 2015-05-21 DIAGNOSIS — F411 Generalized anxiety disorder: Secondary | ICD-10-CM

## 2015-05-21 LAB — T3, FREE: T3, Free: 3.8 pg/mL (ref 2.3–4.2)

## 2015-05-21 LAB — TSH: TSH: 1.7 u[IU]/mL (ref 0.35–4.50)

## 2015-05-21 LAB — T4, FREE: FREE T4: 0.82 ng/dL (ref 0.60–1.60)

## 2015-05-21 MED ORDER — SUMATRIPTAN SUCCINATE 50 MG PO TABS: 50.0000 mg | ORAL_TABLET | ORAL | Status: DC | PRN

## 2015-05-21 NOTE — Progress Notes (Signed)
Pre visit review using our clinic review tool, if applicable. No additional management support is needed unless otherwise documented below in the visit note. 

## 2015-05-21 NOTE — Assessment & Plan Note (Addendum)
Reviewed last notes. I want pt to keep ha diary. Document severity/intenstity, frequency, response to otc nsaid alleve or ibuprofen. Any associated symptoms and location of headache. If any neck pain as well.  If severe ha with neuro signs or symptoms then ED evaluation.  With report of ha for years considering referal to neruologist.  Rx imitrex. After she noted hx of migarine. And sometimes nausea with lt side periorbital ha.

## 2015-05-21 NOTE — Patient Instructions (Addendum)
Generalized anxiety disorder Anxiety- Pt had anxiety for about 15 years.Pt used to be on xanax but mostly during cancer tx. Pt on celexa 40 mg a day. Well controlled now.  Breast cancer, stage 3 Cancer of breast- She states 4 yrs out. See oncologist every 6 months. States inflamatory breast cancer.   Fatigue With some nervousness and when CT done recenlty for mild ha to work up if cancer metastisis showed exopthalmus. So will get thyroid studies today.  Sleep apnea Pt has machine. Pt has been using machine 7-8 years.  Headache Reviewed last notes. I want pt to keep ha diary. Document severity/intenstity, frequency, response to otc nsaid alleve or ibuprofen. Any associated symptoms and location of headache. If any neck pain as well.  If severe ha with neuro signs or symptoms then ED evaluation.  With report of ha for years considering referal to neruologist.    I think weight loss somewhat normal in light of diet changes but maybe excessive so we do need to follow closely.   Follow up in 2 wks or as needed

## 2015-05-21 NOTE — Progress Notes (Signed)
Subjective:    Patient ID: AVERIANNA Vaughan, female    DOB: 1961/07/01, 54 y.o.   MRN: [id number]  HPI  I have reviewed pt PMH, PSH, FH, Social History and Surgical History  Anxiety- Pt had anxiety for about 15 years.Pt used to be on xanax but mostly during cancer tx. Pt on celexa 40 mg a day. Well controlled now.  Cancer of breast- She states 4 yrs out. See oncologist every 6 months. States inflamatory breast cancer.  Tina Vaughan- pt is on omeprazole 30 mg a day. Pt has had egd 3 years ago. Negative findings per pt.   Sleep apnea- Pt has machine. Pt has been using machine 7-8 years.  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  Thyroid disease- maybe pt has some faint ha recently. Oncologist did scan of her head. Per pt read on scat showed exopthalmus. Pt states lost 20 lbs in little over a month. This also occurred with effort. Pt has been cutting back on portion sizes. Stop sodas. Prior to weight loss efforts drank 1 liter regular coke a day. She has not been exercising. Some nervousness.  Esthesticican, no exercise, moderate healthy, no caffeine, single.     LMP- 2012.(After she had chemo stopped menses) Cbc and cmp done 3 days ago.   Review of Systems  Constitutional: Positive for fatigue. Negative for fever, chills, diaphoresis and activity change.  Respiratory: Negative for cough, chest tightness and shortness of breath.   Cardiovascular: Negative for chest pain, palpitations and leg swelling.  Gastrointestinal: Negative for nausea, vomiting and abdominal pain.  Endocrine: Positive for heat intolerance. Negative for polydipsia, polyphagia and polyuria.  Musculoskeletal: Negative for back pain, neck pain and neck stiffness.  Neurological: Negative for dizziness, tremors, seizures, syncope, facial asymmetry, speech difficulty, weakness, light-headedness, numbness and headaches.        But she did mention sometimes nausea with lt periorbital ha after avs printed. Hx of migraine. So will rx imitrex.  To see if helps.  No current ha.  Hematological: Negative for adenopathy. Does not bruise/bleed easily.  Psychiatric/Behavioral: Negative for behavioral problems, confusion and agitation. The patient is not nervous/anxious.     Past Medical History  Diagnosis Date  . Frequent urination   . Depression   . Sleep apnea   . Eczema   . Neuromuscular disorder   . GERD (gastroesophageal reflux disease)   . 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 Lymphedema   . Anxiety   . Thyroid disease     Possible would like to be evaluated  . Breast cancer, stage 3 2012    left    History   Social History  . Marital Status: Single    Spouse Name: N/A  . Number of Children: N/A  . Years of Education: N/A   Occupational History  . Not on file.   Social History Main Topics  . Smoking status: Never Smoker   . Smokeless tobacco: Never Used  . Alcohol Use: Yes     Comment: social  . Drug Use: No  . Sexual Activity: Not Currently    Birth Control/ Protection: Post-menopausal   Other Topics Concern  . Not on file   Social History Narrative    Past Surgical History  Procedure Laterality Date  . Breast surgery  05/12/11    total mastectomy on right, radical mastectomy on left  . Portacath placement    . Laparoscopy  03/14/2012    Procedure: LAPAROSCOPY OPERATIVE;  Surgeon: Tina Oman, MD;  Location: [facility];  Service: Gynecology;  Laterality: N/A;  . Salpingoophorectomy  03/14/2012    Procedure: SALPINGO OOPHERECTOMY;  Surgeon: Tina Oman, MD;  Location: [facility];  Service: Gynecology;  Laterality: Bilateral;  . Port-a-cath removal  04/19/2012    Procedure: MINOR REMOVAL PORT-A-CATH;  Surgeon: Tina Lasso, MD;  Location: [facility];  Service: General;  Laterality: N/A;  . Oophorectomy    . Portacath      Placement     Family History  Problem Relation Age of Onset  . Cancer Mother     Breast cancer and pancreatic cancer x 2  . Stroke Father    . Hypertension Father     Allergies  Allergen Reactions  . Doxycycline Hydrochloride Nausea Only    Current Outpatient Prescriptions on File Prior to Visit  Medication Sig Dispense Refill  . ALPRAZolam (XANAX) 0.5 MG tablet Take 1 tablet (0.5 mg total) by mouth at bedtime as needed for sleep. 30 tablet 0  . anastrozole (ARIMIDEX) 1 MG tablet Take 1 tablet (1 mg total) by mouth daily. 90 tablet 6  . Calcium 200 MG TABS Take 200 mg by mouth daily.     . cholecalciferol (VITAMIN D) 1000 UNITS tablet Take 1,000 Units by mouth daily.     . citalopram (CELEXA) 40 MG tablet TAKE 1 TABLET BY MOUTH DAILY. 30 tablet 5  . fluocinonide cream (LIDEX) 2.77 % Apply 1 application topically 2 (two) times daily as needed (for rash.).    Marland Kitchen gabapentin (NEURONTIN) 300 MG capsule Take 1 capsule (300 mg total) by mouth daily. 30 capsule 5  . ibuprofen (ADVIL,MOTRIN) 200 MG tablet Take 600 mg by mouth every 8 (eight) hours as needed for moderate pain (pain).     Marland Kitchen omeprazole (PRILOSEC) 20 MG capsule Take 1 capsule (20 mg total) by mouth daily. 90 capsule 6  . ondansetron (ZOFRAN) 4 MG tablet Take 1 tablet (4 mg total) by mouth every 8 (eight) hours as needed for nausea or vomiting. 30 tablet 2  . prochlorperazine (COMPAZINE) 10 MG tablet Take 10 mg by mouth every 6 (six) hours as needed (for nausea.).     Marland Kitchen UNABLE TO FIND Dispense compression sleeve 20-30 mm for this patient with history of breast cancer s/p surgery 1 each 0  . naproxen sodium (ANAPROX) 220 MG tablet Take 220 mg by mouth 2 (two) times daily as needed (for muscle aches.).    Marland Kitchen promethazine (PHENERGAN) 25 MG tablet Take 1 tablet (25 mg total) by mouth every 6 (six) hours as needed for nausea. (Patient not taking: Reported on 05/20/2015) 12 tablet 0   No current facility-administered medications on file prior to visit.    BP 130/89 mmHg  Pulse 102  Temp(Src) 98.7 F (37.1 C) (Oral)  Ht 5\' 2"  (1.575 m)  Wt 236 lb 9.6 oz (107.321 kg)  BMI 43.26  kg/m2  SpO2 95%       Objective:   Physical Exam   General Mental Status- Alert. General Appearance- Not in acute distress. Mild exopthalmus appearance.  Skin General: Color- Normal Color. Moisture- Normal Moisture.  Neck Carotid Arteries- Normal color. Moisture- Normal Moisture. No carotid bruits. No JVD. No thyromegaly  Chest and Lung Exam Auscultation: Breath Sounds:-Normal.  Cardiovascular Auscultation:Rythm- Regular. Murmurs & Other Heart Sounds:Auscultation of the heart reveals- No Murmurs.  Abdomen Inspection:-Inspeection Normal. Palpation/Percussion:Note:No mass. Palpation and Percussion of the abdomen reveal- Non Tender, Non Distended +  BS, no rebound or guarding.    Neurologic Cranial Nerve exam:- CN III-XII intact(No nystagmus), symmetric smile. Strength:- 5/5 equal and symmetric strength both upper and lower extremities.     Assessment & Plan:

## 2015-05-21 NOTE — Assessment & Plan Note (Signed)
Cancer of breast- She states 4 yrs out. See oncologist every 6 months. States inflamatory breast cancer.

## 2015-05-21 NOTE — Assessment & Plan Note (Signed)
Pt has machine. Pt has been using machine 7-8 years.

## 2015-05-21 NOTE — Assessment & Plan Note (Signed)
With some nervousness and when CT done recenlty for mild ha to work up if cancer metastisis showed exopthalmus. So will get thyroid studies today.

## 2015-05-21 NOTE — Assessment & Plan Note (Signed)
Anxiety- Pt had anxiety for about 15 years.Pt used to be on xanax but mostly during cancer tx. Pt on celexa 40 mg a day. Well controlled now.

## 2015-05-26 ENCOUNTER — Encounter: Payer: Self-pay | Admitting: Gastroenterology

## 2015-06-01 ENCOUNTER — Other Ambulatory Visit: Payer: Self-pay | Admitting: Hematology and Oncology

## 2015-06-01 ENCOUNTER — Telehealth: Payer: Self-pay | Admitting: Hematology and Oncology

## 2015-06-01 ENCOUNTER — Encounter: Payer: Self-pay | Admitting: Hematology and Oncology

## 2015-06-01 ENCOUNTER — Ambulatory Visit (HOSPITAL_BASED_OUTPATIENT_CLINIC_OR_DEPARTMENT_OTHER): Payer: Medicare Other | Admitting: Hematology and Oncology

## 2015-06-01 VITALS — BP 128/91 | HR 109 | Temp 98.0°F | Resp 18 | Ht 62.0 in | Wt 232.0 lb

## 2015-06-01 DIAGNOSIS — R112 Nausea with vomiting, unspecified: Secondary | ICD-10-CM | POA: Diagnosis not present

## 2015-06-01 DIAGNOSIS — R2231 Localized swelling, mass and lump, right upper limb: Secondary | ICD-10-CM

## 2015-06-01 DIAGNOSIS — R109 Unspecified abdominal pain: Secondary | ICD-10-CM

## 2015-06-01 DIAGNOSIS — M8580 Other specified disorders of bone density and structure, unspecified site: Secondary | ICD-10-CM

## 2015-06-01 DIAGNOSIS — C50912 Malignant neoplasm of unspecified site of left female breast: Secondary | ICD-10-CM

## 2015-06-01 DIAGNOSIS — C773 Secondary and unspecified malignant neoplasm of axilla and upper limb lymph nodes: Secondary | ICD-10-CM | POA: Diagnosis not present

## 2015-06-01 MED ORDER — PROMETHAZINE HCL 25 MG PO TABS: 25.0000 mg | ORAL_TABLET | Freq: Four times a day (QID) | ORAL | Status: DC | PRN

## 2015-06-01 NOTE — Telephone Encounter (Signed)
Gave avs & calendar for July & August. Also gave CT scan contrast

## 2015-06-01 NOTE — Assessment & Plan Note (Signed)
Left breast inflammatory breast cancer T4d, N3, M0 stage IIIc grade 2 left 21 positive lymph nodes, 5.8 cm tumor ER 95% PR 0% Ki-67 61% HER-2 negative. BRCA 2 mutation underwent salpingo-oophorectomy Status post bilateral mastectomies and radiation to left chest wall and axilla currently on tamoxifen since October 2012  Tamoxifen toxicities: Tolerating it very well without any major problems Recurrent migraines: Was evaluated at the symptom management clinic December 2015 and July 2016 Left arm lymphedema: PT/OT, wears a compression sleeve, with marked improvement in lymphedema   Breast Cancer Surveillance: 1. Breast/chest wall and axilla exam 06/01/2015: Normal 2. Mammogram: no role of mammograms since she had bilateral mastectomies.  Radiology review: 1. CT head 05/18/2015 is normal 2. CT abdomen and pelvis December 2015 was normal with fatty liver 3. Bone density 2015 showed T score -1.5 osteopenia  Return to clinic in 6 months for follow-up

## 2015-06-01 NOTE — Progress Notes (Signed)
Patient Care Team: Mackie Pai, PA-C as PCP - General (Physician Assistant) Thea Silversmith, MD as Consulting Physician (Radiation Oncology) Consuela Mimes, MD as Consulting Physician (Hematology and Oncology) Nicholas Lose, MD as Consulting Physician (Hematology and Oncology)  DIAGNOSIS: Breast cancer, left breast   Staging form: Breast, AJCC 7th Edition     Pathologic: Stage IIIC (T4d, N3, cM0) - Signed by [name], MD on 01/19/2014   SUMMARY OF ONCOLOGIC HISTORY:   Breast cancer, left breast   12/07/2010 - 04/12/2011 Neo-Adjuvant Chemotherapy Neoadjuvant FEC x6 followed by Taxotere x4   12/26/2010 Procedure Genetic testing showed mutation for BRCA2 [id number]   05/12/2011 Surgery Bilateral mastectomy with left axillary dissection 11/21 positive lymph nodes 5.8 cm tumor ER 95% PR 0% Ki-67 61% HER-2 negative ratio 1.39 T3, N3, 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   07/03/2011 - 08/17/2011 Radiation Therapy Radiation therapy to the chest and axilla   09/11/2011 -  Anti-estrogen oral therapy Tamoxifen later switched to Arimidex 07/2012   04/22/2012 Surgery Bilateral salpingo-oophorectomy    CHIEF COMPLIANT: Severe pain involving the chest and abdomen midsternal and epigastric with nausea and vomiting  INTERVAL HISTORY: Tina Vaughan is a 54 year old with above-mentioned history of very aggressive left-sided breast cancer treated with neo-adjuvant chemotherapy followed by mastectomy and axillary lymph node dissection. She also had right-sided mastectomy. Today she comes in complaining of right axillary lymph node enlargement. She also feels pain in the midst retrosternal area going down to the abdomen which is constant in nature. It has suppressed her appetite and she is not eating much. She also complains of nausea. She does not feel well and things that there is something underlying all of her disease symptoms. And she is worried that this may be cancer.  REVIEW OF SYSTEMS:    Constitutional: Denies fevers, chills or complains of weight loss Eyes: Denies blurriness of vision Ears, nose, mouth, throat, and face: Denies mucositis or sore throat Respiratory: Denies cough, dyspnea or wheezes Cardiovascular: Denies palpitation, chest discomfort or lower extremity swelling Gastrointestinal:  Nausea and abdominal pain Skin: Denies abnormal skin rashes Lymphatics: Denies new lymphadenopathy or easy bruising Neurological:Denies numbness, tingling or new weaknesses Behavioral/Psych: Mood is stable, no new changes  Breast: Right axillary lymph node enlargement  All other systems were reviewed with the patient and are negative.  I have reviewed the past medical history, past surgical history, social history and family history with the patient and they are unchanged from previous note.  ALLERGIES:  is allergic to doxycycline hydrochloride.  MEDICATIONS:  Current Outpatient Prescriptions  Medication Sig Dispense Refill  . ALPRAZolam (XANAX) 0.5 MG tablet Take 1 tablet (0.5 mg total) by mouth at bedtime as needed for sleep. 30 tablet 0  . anastrozole (ARIMIDEX) 1 MG tablet Take 1 tablet (1 mg total) by mouth daily. 90 tablet 6  . Calcium 200 MG TABS Take 200 mg by mouth daily.     . cholecalciferol (VITAMIN D) 1000 UNITS tablet Take 1,000 Units by mouth daily.     . citalopram (CELEXA) 40 MG tablet TAKE 1 TABLET BY MOUTH DAILY. 30 tablet 5  . fluocinonide cream (LIDEX) 3.42 % Apply 1 application topically 2 (two) times daily as needed (for rash.).    Marland Kitchen gabapentin (NEURONTIN) 300 MG capsule Take 1 capsule (300 mg total) by mouth daily. 30 capsule 5  . ibuprofen (ADVIL,MOTRIN) 200 MG tablet Take 600 mg by mouth every 8 (eight) hours as needed for moderate pain (  pain).     . naproxen sodium (ANAPROX) 220 MG tablet Take 220 mg by mouth 2 (two) times daily as needed (for muscle aches.).    Marland Kitchen omeprazole (PRILOSEC) 20 MG capsule Take 1 capsule (20 mg total) by mouth daily. 90  capsule 6  . ondansetron (ZOFRAN) 4 MG tablet Take 1 tablet (4 mg total) by mouth every 8 (eight) hours as needed for nausea or vomiting. 30 tablet 2  . prochlorperazine (COMPAZINE) 10 MG tablet Take 10 mg by mouth every 6 (six) hours as needed (for nausea.).     Marland Kitchen promethazine (PHENERGAN) 25 MG tablet Take 1 tablet (25 mg total) by mouth every 6 (six) hours as needed for nausea. (Patient not taking: Reported on 05/20/2015) 12 tablet 0  . SUMAtriptan (IMITREX) 50 MG tablet Take 1 tablet (50 mg total) by mouth every 2 (two) hours as needed for migraine. May repeat in 2 hours if headache persists or recurs. 10 tablet 0  . UNABLE TO FIND Dispense compression sleeve 20-30 mm for this patient with history of breast cancer s/p surgery 1 each 0   No current facility-administered medications for this visit.    PHYSICAL EXAMINATION: ECOG PERFORMANCE STATUS: 2 - Symptomatic, <50% confined to bed  Filed Vitals:   06/01/15 1505  BP: 128/91  Pulse: 109  Temp: 98 F (36.7 C)  Resp: 18   Filed Weights   06/01/15 1505  Weight: 232 lb (105.235 kg)    GENERAL:alert, no distress and comfortable SKIN: skin color, texture, turgor are normal, no rashes or significant lesions EYES: normal, Conjunctiva are pink and non-injected, sclera clear OROPHARYNX:no exudate, no erythema and lips, buccal mucosa, and tongue normal  NECK: supple, thyroid normal size, non-tender, without nodularity LYMPH:  no palpable lymphadenopathy in the cervical, axillary or inguinal LUNGS: clear to auscultation and percussion with normal breathing effort HEART: regular rate & rhythm and no murmurs and no lower extremity edema ABDOMEN:abdomen soft, non-tender and normal bowel sounds Musculoskeletal:no cyanosis of digits and no clubbing  NEURO: alert & oriented x 3 with fluent speech, no focal motor/sensory deficits BREAST: Palpable right axillary node . (exam performed in the presence of a chaperone)  LABORATORY DATA:  I have  reviewed the data as listed   Chemistry      Component Value Date/Time   NA 141 05/18/2015 0950   NA 138 10/19/2014 1255   K 4.0 05/18/2015 0950   K 4.1 10/19/2014 1255   CL 101 10/19/2014 1255   CL 108* 07/30/2012 1430   CO2 27 05/18/2015 0950   CO2 26 10/19/2014 1255   BUN 13.6 05/18/2015 0950   BUN 10 10/19/2014 1255   CREATININE 0.8 05/18/2015 0950   CREATININE 0.61 10/19/2014 1255      Component Value Date/Time   CALCIUM 9.7 05/18/2015 0950   CALCIUM 9.5 10/19/2014 1255   ALKPHOS 72 05/18/2015 0950   ALKPHOS 65 10/19/2014 1255   AST 17 05/18/2015 0950   AST 17 10/19/2014 1255   ALT 22 05/18/2015 0950   ALT 26 10/19/2014 1255   BILITOT 0.51 05/18/2015 0950   BILITOT 0.3 10/19/2014 1255       Lab Results  Component Value Date   WBC 6.4 05/18/2015   HGB 14.2 05/18/2015   HCT 41.8 05/18/2015   MCV 87.7 05/18/2015   PLT 227 05/18/2015   NEUTROABS 4.8 05/18/2015     RADIOGRAPHIC STUDIES: I have personally reviewed the radiology reports and agreed with their findings. No results  found.   ASSESSMENT & PLAN:  Breast cancer, left breast Left breast inflammatory breast cancer T4d, N3, M0 stage IIIc grade 2 left 21 positive lymph nodes, 5.8 cm tumor ER 95% PR 0% Ki-67 61% HER-2 negative. BRCA 2 mutation underwent salpingo-oophorectomy Status post bilateral mastectomies and radiation to left chest wall and axilla currently on tamoxifen since October 2012  Tamoxifen toxicities: Tolerating it very well without any major problems Recurrent migraines: Was evaluated at the symptom management clinic December 2015 and July 2016 Left arm lymphedema: PT/OT, wears a compression sleeve, with marked improvement in lymphedema   Breast Cancer Surveillance: 1. Breast/chest wall and axilla exam 06/01/2015: Enlarged right axillary lymph node 2. Mammogram: no role of mammograms since she had bilateral mastectomies.  Radiology review: 1. CT head 05/18/2015 is normal 2. CT abdomen  and pelvis December 2015 was normal with fatty liver 3. Bone density 2015 showed T score -1.5 osteopenia  Abdominal pain: Associated with nausea and vomiting: We will contact her gastroenterologist to see they could evaluate her with an upper endoscopy. She has an appointment with them in August. We will see if we can move the appointment up and had an upper endoscopy to her colonoscopy.   I will obtain a CT of the chest abdomen and pelvis and bone scan for further assessment.  Right axillary lymph node enlarged: We will order an ultrasound-guided biopsy of the lymph node.   Return to clinic in 2 weeks to discuss the results of the CT scans and bone scan and the right axillary lymph node biopsy. If all the test results come back negative then we can attribute some of her symptoms to gastritis or some other gastrointestinal disorder.     No orders of the defined types were placed in this encounter.   The patient has a good understanding of the overall plan. she agrees with it. she will call with any problems that may develop before the next visit here.   Rulon Eisenmenger, MD

## 2015-06-02 ENCOUNTER — Telehealth: Payer: Self-pay | Admitting: *Deleted

## 2015-06-02 ENCOUNTER — Telehealth: Payer: Self-pay | Admitting: Gastroenterology

## 2015-06-02 NOTE — Telephone Encounter (Signed)
Discussed with Dr. Fuller Plan MD of the day.  Patient needs appt with APP prior to procedures Patient is scheduled to see Alonza Bogus, PA on 06/07/15 1:30 I left a message for Beverlee Nims to call back

## 2015-06-02 NOTE — Telephone Encounter (Signed)
Caller name: Diane Relation to pt:RN - [place] Call back number:[phone]   Reason for call:  Diane, RN for Dr. Lindi Adie at [facility], called in wanting to have the patients procedure moved to a sooner date.  She is currently scheduled for 07/20/2015.  Dr. Lindi Adie also wants the patient to have an EGD added to the procedure.  Dr. Lindi Adie is concerned with her cancer.  Can you advise on this please.  Diane would like a return call.

## 2015-06-02 NOTE — Telephone Encounter (Signed)
Patient had asked if she could take more Phenergan than what was ordered, 25 mg every 6 hours as needed. Advised patient to take Phenergan as ordered because increasing the doseage could make her feel jittery. Patient verbalized understanding.

## 2015-06-02 NOTE — Telephone Encounter (Signed)
Spoke with Judeen Hammans at [facility] about moving patient's colonoscopy up and scheduling an endoscopy. Patient given an appt on 7/25 to be seen in their office. Judeen Hammans stated that they would probably be able to do both of these procedures sooner. Informed patient of appt on 7/25. She verbalized understanding.

## 2015-06-02 NOTE — Telephone Encounter (Signed)
Beverlee Nims notified of appt dates and times for appt with Alonza Bogus, PA on MOnday

## 2015-06-03 ENCOUNTER — Encounter: Payer: Self-pay | Admitting: Gastroenterology

## 2015-06-04 ENCOUNTER — Other Ambulatory Visit: Payer: Self-pay

## 2015-06-04 ENCOUNTER — Ambulatory Visit (INDEPENDENT_AMBULATORY_CARE_PROVIDER_SITE_OTHER): Payer: Medicare Other | Admitting: Medical

## 2015-06-04 ENCOUNTER — Encounter (HOSPITAL_COMMUNITY): Payer: Self-pay

## 2015-06-04 ENCOUNTER — Ambulatory Visit (HOSPITAL_BASED_OUTPATIENT_CLINIC_OR_DEPARTMENT_OTHER)
Admission: RE | Admit: 2015-06-04 | Discharge: 2015-06-04 | Disposition: A | Discharge status: Home / Self Care | Payer: Medicare Other | Source: Ambulatory Visit | Attending: Medical | Admitting: Medical

## 2015-06-04 ENCOUNTER — Emergency Department (HOSPITAL_COMMUNITY)
Admission: EM | Admit: 2015-06-04 | Discharge: 2015-06-05 | Disposition: A | Discharge status: Home / Self Care | Payer: Medicare Other | Attending: Emergency Medicine | Admitting: Emergency Medicine

## 2015-06-04 ENCOUNTER — Encounter: Payer: Self-pay | Admitting: Medical

## 2015-06-04 ENCOUNTER — Telehealth: Payer: Self-pay | Admitting: Medical

## 2015-06-04 VITALS — BP 129/88 | HR 107 | Temp 98.4°F | Ht 62.0 in | Wt 229.8 lb

## 2015-06-04 DIAGNOSIS — R109 Unspecified abdominal pain: Secondary | ICD-10-CM | POA: Insufficient documentation

## 2015-06-04 DIAGNOSIS — Z8639 Personal history of other endocrine, nutritional and metabolic disease: Secondary | ICD-10-CM | POA: Insufficient documentation

## 2015-06-04 DIAGNOSIS — K219 Gastro-esophageal reflux disease without esophagitis: Secondary | ICD-10-CM | POA: Insufficient documentation

## 2015-06-04 DIAGNOSIS — R1010 Upper abdominal pain, unspecified: Secondary | ICD-10-CM

## 2015-06-04 DIAGNOSIS — F329 Major depressive disorder, single episode, unspecified: Secondary | ICD-10-CM | POA: Diagnosis not present

## 2015-06-04 DIAGNOSIS — Z872 Personal history of diseases of the skin and subcutaneous tissue: Secondary | ICD-10-CM | POA: Insufficient documentation

## 2015-06-04 DIAGNOSIS — R51 Headache: Secondary | ICD-10-CM | POA: Diagnosis not present

## 2015-06-04 DIAGNOSIS — Z79899 Other long term (current) drug therapy: Secondary | ICD-10-CM | POA: Insufficient documentation

## 2015-06-04 DIAGNOSIS — G629 Polyneuropathy, unspecified: Secondary | ICD-10-CM | POA: Insufficient documentation

## 2015-06-04 DIAGNOSIS — R1084 Generalized abdominal pain: Secondary | ICD-10-CM | POA: Diagnosis not present

## 2015-06-04 DIAGNOSIS — R319 Hematuria, unspecified: Secondary | ICD-10-CM

## 2015-06-04 DIAGNOSIS — I951 Orthostatic hypotension: Secondary | ICD-10-CM | POA: Diagnosis not present

## 2015-06-04 DIAGNOSIS — Z853 Personal history of malignant neoplasm of breast: Secondary | ICD-10-CM | POA: Diagnosis not present

## 2015-06-04 DIAGNOSIS — R55 Syncope and collapse: Secondary | ICD-10-CM

## 2015-06-04 DIAGNOSIS — R519 Headache, unspecified: Secondary | ICD-10-CM

## 2015-06-04 DIAGNOSIS — F419 Anxiety disorder, unspecified: Secondary | ICD-10-CM | POA: Insufficient documentation

## 2015-06-04 DIAGNOSIS — R531 Weakness: Secondary | ICD-10-CM | POA: Diagnosis present

## 2015-06-04 LAB — BASIC METABOLIC PANEL
Anion gap: 8 (ref 5–15)
BUN: 14 mg/dL (ref 6–20)
CO2: 27 mmol/L (ref 22–32)
CREATININE: 0.9 mg/dL (ref 0.44–1.00)
Calcium: 9.6 mg/dL (ref 8.9–10.3)
Chloride: 103 mmol/L (ref 101–111)
GFR calc Af Amer: >60 mL/min (ref >60)
GFR calc non Af Amer: >60 mL/min (ref >60)
Glucose, Bld: 127 mg/dL — ABNORMAL HIGH (ref 65–99)
POTASSIUM: 3.9 mmol/L (ref 3.5–5.1)
Sodium: 138 mmol/L (ref 135–145)

## 2015-06-04 LAB — COMPREHENSIVE METABOLIC PANEL
ALK PHOS: 60 U/L (ref 39–117)
ALT: 26 U/L (ref 0–35)
AST: 18 U/L (ref 0–37)
Albumin: 4.5 g/dL (ref 3.5–5.2)
BUN: 14 mg/dL (ref 6–23)
CO2: 29 meq/L (ref 19–32)
Calcium: 10.1 mg/dL (ref 8.4–10.5)
Chloride: 102 mEq/L (ref 96–112)
Creatinine, Ser: 0.76 mg/dL (ref 0.40–1.20)
GFR: 84.61 mL/min (ref >60.00)
Glucose, Bld: 96 mg/dL (ref 70–99)
Potassium: 3.5 mEq/L (ref 3.5–5.1)
SODIUM: 140 meq/L (ref 135–145)
TOTAL PROTEIN: 7.3 g/dL (ref 6.0–8.3)
Total Bilirubin: 0.7 mg/dL (ref 0.2–1.2)

## 2015-06-04 LAB — URINALYSIS, ROUTINE W REFLEX MICROSCOPIC
Bilirubin Urine: NEGATIVE
GLUCOSE, UA: NEGATIVE mg/dL
Ketones, ur: NEGATIVE mg/dL
LEUKOCYTES UA: NEGATIVE
Nitrite: NEGATIVE
PROTEIN: NEGATIVE mg/dL
Specific Gravity, Urine: 1.015 (ref 1.005–1.030)
Urobilinogen, UA: 0.2 mg/dL (ref 0.0–1.0)
pH: 6 (ref 5.0–8.0)

## 2015-06-04 LAB — AMYLASE: Amylase: 24 U/L — ABNORMAL LOW (ref 27–131)

## 2015-06-04 LAB — CBG MONITORING, ED: GLUCOSE-CAPILLARY: 118 mg/dL — AB (ref 65–99)

## 2015-06-04 LAB — URINE MICROSCOPIC-ADD ON

## 2015-06-04 LAB — CBC WITH DIFFERENTIAL/PLATELET
Basophils Absolute: 0 10*3/uL (ref 0.0–0.1)
Basophils Relative: 0.4 % (ref 0.0–3.0)
Eosinophils Absolute: 0.1 10*3/uL (ref 0.0–0.7)
Eosinophils Relative: 1.4 % (ref 0.0–5.0)
HEMATOCRIT: 43.1 % (ref 36.0–46.0)
HEMOGLOBIN: 15 g/dL (ref 12.0–15.0)
Lymphocytes Relative: 30.7 % (ref 12.0–46.0)
Lymphs Abs: 1.6 10*3/uL (ref 0.7–4.0)
MCHC: 34.9 g/dL (ref 30.0–36.0)
MCV: 86.9 fl (ref 78.0–100.0)
Monocytes Absolute: 0.4 10*3/uL (ref 0.1–1.0)
Monocytes Relative: 7.7 % (ref 3.0–12.0)
NEUTROS PCT: 59.8 % (ref 43.0–77.0)
Neutro Abs: 3.2 10*3/uL (ref 1.4–7.7)
PLATELETS: 237 10*3/uL (ref 150.0–400.0)
RBC: 4.96 Mil/uL (ref 3.87–5.11)
RDW: 13.4 % (ref 11.5–15.5)
WBC: 5.3 10*3/uL (ref 4.0–10.5)

## 2015-06-04 LAB — CBC
HEMATOCRIT: 42.3 % (ref 36.0–46.0)
Hemoglobin: 14.5 g/dL (ref 12.0–15.0)
MCH: 29.9 pg (ref 26.0–34.0)
MCHC: 34.3 g/dL (ref 30.0–36.0)
MCV: 87.2 fL (ref 78.0–100.0)
Platelets: 214 10*3/uL (ref 150–400)
RBC: 4.85 MIL/uL (ref 3.87–5.11)
RDW: 13.1 % (ref 11.5–15.5)
WBC: 6.8 10*3/uL (ref 4.0–10.5)

## 2015-06-04 LAB — POCT URINALYSIS DIPSTICK
Bilirubin, UA: 1
Blood, UA: POSITIVE
GLUCOSE UA: NEGATIVE
KETONES UA: NEGATIVE
Leukocytes, UA: NEGATIVE
NITRITE UA: NEGATIVE
PH UA: 5
Protein, UA: 100
Spec Grav, UA: >=1.03
UROBILINOGEN UA: 0.2

## 2015-06-04 LAB — LIPASE: LIPASE: 37 U/L (ref 11.0–59.0)

## 2015-06-04 MED ORDER — RANITIDINE HCL 150 MG PO CAPS: 150.0000 mg | ORAL_CAPSULE | Freq: Two times a day (BID) | ORAL | Status: DC

## 2015-06-04 MED ORDER — SODIUM CHLORIDE 0.9 % IV BOLUS (SEPSIS)
1000.0000 mL | Freq: Once | INTRAVENOUS | Status: AC
Start: 2015-06-04 — End: 2015-06-05
  Administered 2015-06-04: 1000 mL via INTRAVENOUS

## 2015-06-04 MED ORDER — PROMETHAZINE HCL 25 MG/ML IJ SOLN
25.0000 mg | Freq: Once | INTRAMUSCULAR | Status: AC
  Administered 2015-06-04: 25 mg via INTRAVENOUS
  Filled 2015-06-04: qty 1

## 2015-06-04 NOTE — Progress Notes (Signed)
Subjective:    Patient ID: Tina Vaughan, female    DOB: [birth year], 54 y.o.   MRN: [id number]  HPI   Pt in states since I last saw her ha almost daily. Pt states mild left sided ha. Pt has imitrex available if ha is severe. Most of time ha at night.  Some nauseau.  at times with ha. But somewhat always nausea. Pt ct of head on 05-18-2015 was negative. Pt never saw neuruologist for years. Pt states ha are not her concern today. He stomach is the problem.  Last ha last night. No associated neurologic signs or symptoms.   Pt did follow up with oncologist for her hx of breast CA   New complaint of abdominal pain for over a week. States after eating small amounts feel bloated. And also states after she eats will have pain. Sometime pain without eating.(she mentioned these complaints to oncologist)Pt oncologist moved up her colonoscopy and requested she had endoscopy as well. Pt also ct of neck down to pelvis also ordered. Some epigastric pain, ruq pain and some pain in her back. Pt has her gallbladder.   Some back pain at time rt upper back medial to rt scapula at time with abd pain.      Review of Systems  Constitutional: Negative for fever, chills, diaphoresis, activity change and fatigue.  Respiratory: Negative for cough, chest tightness and shortness of breath.   Cardiovascular: Negative for chest pain, palpitations and leg swelling.  Gastrointestinal: Positive for nausea and abdominal pain. Negative for vomiting, diarrhea, constipation, blood in stool, abdominal distention and rectal pain.  Genitourinary: Negative.   Musculoskeletal: Negative for neck pain and neck stiffness.  Neurological: Negative for dizziness, tremors, seizures, syncope, facial asymmetry, speech difficulty, weakness, light-headedness, numbness and headaches.  Psychiatric/Behavioral: Negative for behavioral problems, confusion and agitation. The patient is not nervous/anxious.     Past Medical History  Diagnosis  Date  . Frequent urination   . Depression   . Sleep apnea   . Eczema   . Neuromuscular disorder   . GERD (gastroesophageal reflux disease)   . 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 Lymphedema   . Anxiety   . Thyroid disease     Possible would like to be evaluated  . Breast cancer, stage 3 2012    left    History   Social History  . Marital Status: Single    Spouse Name: N/A  . Number of Children: N/A  . Years of Education: N/A   Occupational History  . Not on file.   Social History Main Topics  . Smoking status: Never Smoker   . Smokeless tobacco: Never Used  . Alcohol Use: Yes     Comment: social  . Drug Use: No  . Sexual Activity: Not Currently    Birth Control/ Protection: Post-menopausal   Other Topics Concern  . Not on file   Social History Narrative    Past Surgical History  Procedure Laterality Date  . Mastectomy Bilateral 05/12/11    total mastectomy on right, radical mastectomy on left  . Portacath placement    . Laparoscopy  03/14/2012    Procedure: LAPAROSCOPY OPERATIVE;  Surgeon: Osborne Oman, MD;  Location: [facility];  Service: Gynecology;  Laterality: N/A;  . Salpingoophorectomy  03/14/2012    Procedure: SALPINGO OOPHERECTOMY;  Surgeon: Osborne Oman, MD;  Location: [facility];  Service: Gynecology;  Laterality: Bilateral;  . Port-a-cath removal  04/19/2012    Procedure: MINOR REMOVAL PORT-A-CATH;  Surgeon: Haywood Lasso, MD;  Location: [facility];  Service: General;  Laterality: N/A;  . Oophorectomy    . Portacath      Placement     Family History  Problem Relation Age of Onset  . Breast cancer Mother   . Stroke Father   . Hypertension Father   . Pancreatic cancer Mother     pancreatic cancer x 2    Allergies  Allergen Reactions  . Doxycycline Hydrochloride Nausea Only    Current Outpatient Prescriptions on File Prior to Visit  Medication Sig Dispense Refill  . ALPRAZolam (XANAX) 0.5 MG tablet Take 1  tablet (0.5 mg total) by mouth at bedtime as needed for sleep. 30 tablet 0  . anastrozole (ARIMIDEX) 1 MG tablet Take 1 tablet (1 mg total) by mouth daily. 90 tablet 6  . Calcium 200 MG TABS Take 200 mg by mouth daily.     . cholecalciferol (VITAMIN D) 1000 UNITS tablet Take 1,000 Units by mouth daily.     . citalopram (CELEXA) 40 MG tablet TAKE 1 TABLET BY MOUTH DAILY. 30 tablet 5  . fluocinonide cream (LIDEX) 3.66 % Apply 1 application topically 2 (two) times daily as needed (for rash.).    Marland Kitchen gabapentin (NEURONTIN) 300 MG capsule Take 1 capsule (300 mg total) by mouth daily. 30 capsule 5  . ibuprofen (ADVIL,MOTRIN) 200 MG tablet Take 600 mg by mouth every 8 (eight) hours as needed for moderate pain (pain).     . ondansetron (ZOFRAN) 4 MG tablet Take 1 tablet (4 mg total) by mouth every 8 (eight) hours as needed for nausea or vomiting. 30 tablet 2  . prochlorperazine (COMPAZINE) 10 MG tablet Take 10 mg by mouth every 6 (six) hours as needed (for nausea.).     Marland Kitchen promethazine (PHENERGAN) 25 MG tablet Take 1 tablet (25 mg total) by mouth every 6 (six) hours as needed for nausea. 30 tablet 1  . SUMAtriptan (IMITREX) 50 MG tablet Take 1 tablet (50 mg total) by mouth every 2 (two) hours as needed for migraine. May repeat in 2 hours if headache persists or recurs. 10 tablet 0  . UNABLE TO FIND Dispense compression sleeve 20-30 mm for this patient with history of breast cancer s/p surgery 1 each 0  . naproxen sodium (ANAPROX) 220 MG tablet Take 220 mg by mouth 2 (two) times daily as needed (for muscle aches.).    Marland Kitchen omeprazole (PRILOSEC) 20 MG capsule Take 1 capsule (20 mg total) by mouth daily. 90 capsule 6   No current facility-administered medications on file prior to visit.    BP 129/88 mmHg  Pulse 107  Temp(Src) 98.4 F (36.9 C) (Oral)  Ht 5\' 2"  (1.575 m)  Wt 229 lb 12.8 oz (104.237 kg)  BMI 42.02 kg/m2  SpO2 96%       Objective:   Physical Exam   General Appearance- Not in acute  distress.  HEENT Eyes- Scleraeral/Conjuntiva-bilat- Not Yellow. Mouth & Throat- Normal.  Chest and Lung Exam Auscultation: Breath sounds:-Normal. Adventitious sounds:- No Adventitious sounds.  Cardiovascular Auscultation:Rythm - Regular. Heart Sounds -Normal heart sounds.  Abdomen Inspection:-Inspection Normal.  Palpation/Perucssion: Palpation and Percussion of the abdomen reveal- epigastric and ruq mild-moderate Tender, No Rebound tenderness, No rigidity(Guarding) and No Palpable abdominal masses.  Liver:-Normal.  Spleen:- Normal.   Back- no cva tenderness    Neurologic Cranial Nerve exam:- CN III-XII intact(No nystagmus), symmetric smile. Strength:-  5/5 equal and symmetric strength both upper and lower extremities.         Assessment & Plan:

## 2015-06-04 NOTE — Assessment & Plan Note (Signed)
Will get ua, cbc, cmp, amylase, lipase, and abd [name] stat.

## 2015-06-04 NOTE — Telephone Encounter (Signed)
Notified pt on [name] review.

## 2015-06-04 NOTE — Patient Instructions (Addendum)
Pain in the abdomen Will get ua, cbc, cmp, amylase, lipase, and abd [name] stat.  Headache Persistent daily ha. Stable presently. Normal neurologic exam.Continue same management but refer to neurologist.   For abdomen pain. If studies negative then bland diet guidelines and start ranitidine.  Follow up 7 days or as needed.  If pain worsens pending study results or over weekend then ED evaluation.  Recommend repeat ua on follow up. To check for blood.

## 2015-06-04 NOTE — Assessment & Plan Note (Signed)
Persistent daily ha. Stable presently. Normal neurologic exam.Continue same management but refer to neurologist.

## 2015-06-04 NOTE — ED Notes (Signed)
Patient reports she has been having abdominal pain and nausea for the past two weeks, unknown cause.  She reports today she became extremely weak and diaphoretic.

## 2015-06-04 NOTE — ED Provider Notes (Signed)
CSN: [id number]     Arrival date & time 06/04/15  2056 History   First MD Initiated Contact with Patient 06/04/15 2128     Chief Complaint  Patient presents with  . 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 Per patient, she has been having nausea sensation, feeling tired, having upper abdominal discomfort which she described as a gnawing sensation are waxing waning with his postprandial pain. She has been seen by her PCP and oncology for these complaints. She had a normal abdominal ultrasound today along with "blood work done". She is scheduled to have an abdominal and pelvis CT scan next week. After returning home from the doctor's office, patient reports 2 separate episodes of near syncope where she was standing up cutting flower when she felt a room spinning sensation, breakout into a sweat, chills and nearly syncopized.  These symptoms are new to her. She was concerned and decided to come to the ER for further evaluation. At this time patient felt she is back to her normal baseline. She denies having any significant fever, chills, severe headache, chest pain, difficulty breathing, worsening abdominal pain, back pain, dysuria. She does endorse occasional mild left-sided headache, in which she will be following up with a neurologist has been scheduled. She does have both primary care provider and oncologist, that are evaluating her. Her breast cancer is currently in remission however patient did reportedly found a new lump to her left axillary region that her oncologist is aware. She denies any worsening of dizziness with positional change.     Past Medical History  Diagnosis Date  . Frequent urination   . Depression   . Sleep apnea   . Eczema   . Neuromuscular disorder   . GERD (gastroesophageal reflux disease)   . Neuropathy     due to  left lymph node dissection  . Lymphedema   . Anxiety   . Thyroid disease     Possible would like to be evaluated  . Breast cancer, stage 3 2012    left   Past Surgical History  Procedure Laterality Date  . Mastectomy Bilateral 05/12/11    total mastectomy on right, radical mastectomy on left  . Portacath placement    . Laparoscopy  03/14/2012    Procedure: LAPAROSCOPY OPERATIVE;  Surgeon: Osborne Oman, MD;  Location: [facility];  Service: Gynecology;  Laterality: N/A;  . Salpingoophorectomy  03/14/2012    Procedure: SALPINGO OOPHERECTOMY;  Surgeon: Osborne Oman, MD;  Location: [facility];  Service: Gynecology;  Laterality: Bilateral;  . Port-a-cath removal  04/19/2012    Procedure: MINOR REMOVAL PORT-A-CATH;  Surgeon: Haywood Lasso, MD;  Location: [facility];  Service: General;  Laterality: N/A;  . Oophorectomy    . Portacath      Placement    Family History  Problem Relation Age of Onset  . Breast cancer Mother   . Stroke Father   . Hypertension Father   . Pancreatic cancer Mother     pancreatic cancer x 2   History  Substance Use Topics  . Smoking status: Never Smoker   . Smokeless tobacco: Never Used  . Alcohol Use: Yes     Comment: social   OB History    Gravida Para Term Preterm AB TAB SAB Ectopic Multiple Living   1    1 1  Review of Systems  All other systems reviewed and are negative.     Allergies  Doxycycline hydrochloride  Home Medications   Prior to Admission medications   Medication Sig Start Date End Date Taking? Authorizing Provider  ALPRAZolam Duanne Moron) 0.5 MG tablet Take 1 tablet (0.5 mg total) by mouth at bedtime as needed for sleep. 12/10/13   Consuela Mimes, MD  anastrozole (ARIMIDEX) 1 MG tablet Take 1 tablet (1 mg total) by mouth daily. 07/22/13   Consuela Mimes, MD  Calcium 200 MG TABS Take 200 mg by mouth daily.     Historical Provider, MD  cholecalciferol (VITAMIN D) 1000 UNITS tablet Take 1,000 Units by mouth daily.      Historical Provider, MD  citalopram (CELEXA) 40 MG tablet TAKE 1 TABLET BY MOUTH DAILY. 09/30/14   Nicholas Lose, MD  fluocinonide cream (LIDEX) 2.02 % Apply 1 application topically 2 (two) times daily as needed (for rash.).    Historical Provider, MD  gabapentin (NEURONTIN) 300 MG capsule Take 1 capsule (300 mg total) by mouth daily. 02/10/14   Consuela Mimes, MD  ibuprofen (ADVIL,MOTRIN) 200 MG tablet Take 600 mg by mouth every 8 (eight) hours as needed for moderate pain (pain).     Historical Provider, MD  naproxen sodium (ANAPROX) 220 MG tablet Take 220 mg by mouth 2 (two) times daily as needed (for muscle aches.).    Historical Provider, MD  omeprazole (PRILOSEC) 20 MG capsule Take 1 capsule (20 mg total) by mouth daily. 07/22/13   Consuela Mimes, MD  ondansetron (ZOFRAN) 4 MG tablet Take 1 tablet (4 mg total) by mouth every 8 (eight) hours as needed for nausea or vomiting. 05/18/15   Susanne Borders, NP  prochlorperazine (COMPAZINE) 10 MG tablet Take 10 mg by mouth every 6 (six) hours as needed (for nausea.).     Historical Provider, MD  promethazine (PHENERGAN) 25 MG tablet Take 1 tablet (25 mg total) by mouth every 6 (six) hours as needed for nausea. 06/01/15   Nicholas Lose, MD  ranitidine (ZANTAC) 150 MG capsule Take 1 capsule (150 mg total) by mouth 2 (two) times daily. 06/04/15   Percell Miller Saguier, PA-C  SUMAtriptan (IMITREX) 50 MG tablet Take 1 tablet (50 mg total) by mouth every 2 (two) hours as needed for migraine. May repeat in 2 hours if headache persists or recurs. 05/21/15   Percell Miller Saguier, PA-C  UNABLE TO FIND Dispense compression sleeve 20-30 mm for this patient with history of breast cancer s/p surgery 09/14/14   Nicholas Lose, MD   BP 125/80 mmHg  Pulse 96  Temp(Src) 98.4 F (36.9 C) (Oral)  Resp 20  SpO2 99% Physical Exam  Constitutional: She is oriented to person, place, and time. She appears well-developed and well-nourished. No distress.  HENT:  Head: Atraumatic.  Eyes: Conjunctivae  are normal.  Neck: Neck supple.  Cardiovascular: Normal rate and regular rhythm.   Pulmonary/Chest: Effort normal and breath sounds normal.  Abdominal: Soft. Bowel sounds are normal. She exhibits no distension. There is no tenderness.  Neurological: She is alert and oriented to person, place, and time. No cranial nerve deficit or sensory deficit. GCS eye subscore is 4. GCS verbal subscore is 5. GCS motor subscore is 6.  Skin: No rash noted.  Psychiatric: She has a normal mood and affect.  Nursing note and vitals reviewed.   ED Course  Procedures (including critical care time)  Pt presents for evaluation of near syncope. She is back to her normal  baseline. Workup initiated.  10:33 PM EKG without concerning arrhythmia. No evidence of anemia. Electrolytes panels are unremarkable. Normal CBG.  12:17 AM When checking orthostatic vital sign, her heart rate elevated from 89 lying to 106 standing but blood pressure remained stable. Patient currently receiving IV fluid. Urine without urinary tract infection. Electrolytes are reassuring. Normal white count. No anemia. No chest pain fever or productive cough concerning for pneumonia. No severe headache.  12:42 AM Patient felt better after receiving IV fluids. Vital signs stabilized. She is otherwise stable for discharge. Able to ambulate without difficulty. She has appointment to follow-up with her PCP and her oncologist along with schedule abd/pelvic CT scan and bone scan in the near future.  She is also scheduled to be seen by neurologist.  Pt aware to return if her condition worsen.  Labs Review Labs Reviewed  BASIC METABOLIC PANEL - Abnormal; Notable for the following:    Glucose, Bld 127 (*)    All other components within normal limits  URINALYSIS, ROUTINE W REFLEX MICROSCOPIC (NOT AT [facility]) - Abnormal; Notable for the following:    APPearance CLOUDY (*)    Hgb urine dipstick TRACE (*)    All other components within normal limits  URINE  MICROSCOPIC-ADD ON - Abnormal; Notable for the following:    Casts HYALINE CASTS (*)    All other components within normal limits  CBG MONITORING, ED - Abnormal; Notable for the following:    Glucose-Capillary 118 (*)    All other components within normal limits  CBC  LIPASE, BLOOD  TROPONIN I    Imaging Review US Abdomen Complete  06/04/2015   CLINICAL DATA:  Acute generalized abdominal pain.  EXAM: ULTRASOUND ABDOMEN COMPLETE  COMPARISON:  CT scan of October 19, 2014.  FINDINGS: Gallbladder: No gallstones or wall thickening visualized. No pericholecystic fluid is noted. No sonographic Murphy sign noted.  Common bile duct: Diameter: 2 mm which is within normal limits.  Liver: No focal lesion identified. Echogenic and coarse echotexture of hepatic parenchyma is noted suggesting diffuse hepatocellular disease.  IVC: Not well visualized due to overlying bowel gas.  Pancreas: Visualized portion unremarkable.  Spleen: Size and appearance within normal limits.  Right Kidney: Length: 10.1 cm. Echogenicity within normal limits. No mass or hydronephrosis visualized.  Left Kidney: Length: 10.4 cm. Echogenicity within normal limits. No mass or hydronephrosis visualized.  Abdominal aorta: No aneurysm visualized.  Other findings: None.  IMPRESSION: Echogenic and coarse echotexture of hepatic parenchyma is noted suggesting diffuse hepatocellular disease. No other abnormality seen in the abdomen.   Electronically Signed   By: Marijo Conception, M.D.   On: 06/04/2015 12:24     EKG Interpretation   Date/Time:  Friday June 04 2015 21:14:36 EDT Ventricular Rate:  98 PR Interval:  144 QRS Duration: 77 QT Interval:  426 QTC Calculation: 544 R Axis:   46 Text Interpretation:  Sinus rhythm Borderline T abnormalities, anterior  leads Prolonged QT interval Since previous tracing QT interval is  prolonged Confirmed by Canary Brim  MD, MARTHA [phone]) on 06/04/2015 11:24:11 PM      MDM   Final diagnoses:   Orthostasis  Near syncope    BP 121/82 mmHg  Pulse 89  Temp(Src) 98.4 F (36.9 C) (Oral)  Resp 25  SpO2 96%  I have reviewed nursing notes and vital signs. I personally viewed the imaging tests through PACS system and agrees with radiologist's intepretation I reviewed available ER/hospitalization records through the EMR  Domenic Moras, PA-C 06/05/15 [place], MD 06/05/15 [phone]

## 2015-06-04 NOTE — ED Notes (Signed)
Patient taken to bathroom via wheelchair.

## 2015-06-04 NOTE — Progress Notes (Signed)
Pre visit review using our clinic review tool, if applicable. No additional management support is needed unless otherwise documented below in the visit note. 

## 2015-06-05 LAB — URINE CULTURE
COLONY COUNT: NO GROWTH
Organism ID, Bacteria: NO GROWTH

## 2015-06-05 NOTE — ED Notes (Signed)
Discharge Vital Signs T 98.3 BP 107/59 RR 18 SPO2 97%

## 2015-06-05 NOTE — Discharge Instructions (Signed)
Please follow up with your doctor for further management of your condition.  Return if your symptoms worsen or if you have other concerns  Near-Syncope Near-syncope (commonly known as near fainting) is sudden weakness, dizziness, or feeling like you might pass out. During an episode of near-syncope, you may also develop pale skin, have tunnel vision, or feel sick to your stomach (nauseous). Near-syncope may occur when getting up after sitting or while standing for a long time. It is caused by a sudden decrease in blood flow to the brain. This decrease can result from various causes or triggers, most of which are not serious. However, because near-syncope can sometimes be a sign of something serious, a medical evaluation is required. The specific cause is often not determined. HOME CARE INSTRUCTIONS  Monitor your condition for any changes. The following actions may help to alleviate any discomfort you are experiencing:  Have someone stay with you until you feel stable.  Lie down right away and prop your feet up if you start feeling like you might faint. Breathe deeply and steadily. Wait until all the symptoms have passed. Most of these episodes last only a few minutes. You may feel tired for several hours.   Drink enough fluids to keep your urine clear or pale yellow.   If you are taking blood pressure or heart medicine, get up slowly when seated or lying down. Take several minutes to sit and then stand. This can reduce dizziness.  Follow up with your health care provider as directed. SEEK IMMEDIATE MEDICAL CARE IF:   You have a severe headache.   You have unusual pain in the chest, abdomen, or back.   You are bleeding from the mouth or rectum, or you have black or tarry stool.   You have an irregular or very fast heartbeat.   You have repeated fainting or have seizure-like jerking during an episode.   You faint when sitting or lying down.   You have confusion.   You have  difficulty walking.   You have severe weakness.   You have vision problems.  MAKE SURE YOU:   Understand these instructions.  Will watch your condition.  Will get help right away if you are not doing well or get worse. Document Released: 10/30/2005 Document Revised: 11/04/2013 Document Reviewed: 04/04/2013 [facility] Patient Information 2015 [place], [place]. This information is not intended to replace advice given to you by your health care provider. Make sure you discuss any questions you have with your health care provider.

## 2015-06-07 ENCOUNTER — Ambulatory Visit (INDEPENDENT_AMBULATORY_CARE_PROVIDER_SITE_OTHER): Payer: Medicare Other | Admitting: Gastroenterology

## 2015-06-07 ENCOUNTER — Encounter: Payer: Self-pay | Admitting: Gastroenterology

## 2015-06-07 VITALS — BP 110/80 | HR 88 | Ht 62.0 in | Wt 229.0 lb

## 2015-06-07 DIAGNOSIS — R140 Abdominal distension (gaseous): Secondary | ICD-10-CM

## 2015-06-07 DIAGNOSIS — R6881 Early satiety: Secondary | ICD-10-CM

## 2015-06-07 DIAGNOSIS — Z1211 Encounter for screening for malignant neoplasm of colon: Secondary | ICD-10-CM

## 2015-06-07 DIAGNOSIS — R110 Nausea: Secondary | ICD-10-CM | POA: Diagnosis not present

## 2015-06-07 DIAGNOSIS — R109 Unspecified abdominal pain: Secondary | ICD-10-CM | POA: Diagnosis not present

## 2015-06-07 MED ORDER — NA SULFATE-K SULFATE-MG SULF 17.5-3.13-1.6 GM/177ML PO SOLN: ORAL | Status: DC

## 2015-06-07 NOTE — Patient Instructions (Addendum)
Normal BMI (Body Mass Index- based on height and weight) is between 19 and 25. Your BMI today is Body mass index is 41.87 kg/(m^2). Marland Kitchen Please consider follow up  regarding your BMI with your Primary Care Provider.  You have been scheduled for an endoscopy and colonoscopy. Please follow the written instructions given to you at your visit today. Please pick up your prep supplies at the pharmacy within the next 1-3 days. If you use inhalers (even only as needed), please bring them with you on the day of your procedure. Your physician has requested that you go to www.startemmi.com and enter the access code given to you at your visit today. This web site gives a general overview about your procedure. However, you should still follow specific instructions given to you by our office regarding your preparation for the procedure.  We have sent medications to your pharmacy for you to pick up at your convenience.

## 2015-06-08 ENCOUNTER — Other Ambulatory Visit: Payer: Self-pay | Admitting: Hematology and Oncology

## 2015-06-08 ENCOUNTER — Encounter: Payer: Self-pay | Admitting: Gastroenterology

## 2015-06-08 ENCOUNTER — Telehealth: Payer: Self-pay | Admitting: *Deleted

## 2015-06-08 ENCOUNTER — Ambulatory Visit
Admission: RE | Admit: 2015-06-08 | Discharge: 2015-06-08 | Disposition: A | Discharge status: Home / Self Care | Payer: Medicare Other | Source: Ambulatory Visit | Attending: Hematology and Oncology | Admitting: Hematology and Oncology

## 2015-06-08 DIAGNOSIS — R2231 Localized swelling, mass and lump, right upper limb: Secondary | ICD-10-CM

## 2015-06-08 DIAGNOSIS — R6881 Early satiety: Secondary | ICD-10-CM | POA: Insufficient documentation

## 2015-06-08 DIAGNOSIS — R140 Abdominal distension (gaseous): Secondary | ICD-10-CM | POA: Insufficient documentation

## 2015-06-08 DIAGNOSIS — Z1211 Encounter for screening for malignant neoplasm of colon: Secondary | ICD-10-CM | POA: Insufficient documentation

## 2015-06-08 DIAGNOSIS — Z853 Personal history of malignant neoplasm of breast: Secondary | ICD-10-CM | POA: Diagnosis not present

## 2015-06-08 DIAGNOSIS — R110 Nausea: Secondary | ICD-10-CM | POA: Insufficient documentation

## 2015-06-08 NOTE — Telephone Encounter (Signed)
Patient called reporting she had "preliminary visit yesterday with [facility] for colonoscopy.  The earliest the colonoscopy can be done by anyone in th is practice is July 20, 2015.  If they have a cancellation they will let me know.  Is this okay or does Dr. Lindi Adie need this done sooner?"  Does not recall GI provider's name.

## 2015-06-08 NOTE — Progress Notes (Signed)
06/07/2015 Tina Vaughan [id number] 1961/06/15   HISTORY OF PRESENT ILLNESS:  This is a 54 year old female who is new to our practice and was actually scheduled for a screening colonoscopy with Dr. Fuller Plan on 9/6, however, her oncologist, Dr. Lindi Adie, wanted [name] to see her and consider performing and EGD as well for evaluation of her recent symptoms.  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  She tells me that she was feeling fine until 3 weeks ago when she developed severe nausea.  Is also having headaches.  Denies any vomiting but also has some generalized abdominal discomfort along with complaints of bloating, poor appetite, and early satiety.  She says that she had some of the same symptoms (the nausea and headaches) in December for two weeks but they went away and did not return again until 3 weeks ago.  She had a CT scan of the head earlier this month that was unremarkable.  Ultrasound this month showed echogenic and coarse echotexture of hepatic parenchyma but no other abnormalities were seen.  She is already scheduled for a CT scan of the abdomen and pelvis later this week.  She says that she doesn't know what is going on but "something isn't right".  The nausea is constant, every day whether she eats or not, and is very severe; puts her right on the verge of vomiting, but she never does.  This is all making her very anxious.  She is on omeprazole 20 mg daily and ranitidine 150 mg BID.  Has taken Zofran, phenergan, and compazine without relief.  Admits to NSAID use only about once a week.   Past Medical History  Diagnosis Date  . Frequent urination   . Depression   . Sleep apnea   . Eczema   . Neuromuscular disorder   . GERD (gastroesophageal reflux disease)   . 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 Lymphedema   . Anxiety   . Thyroid disease     Possible would like to be evaluated  . Breast cancer, stage 3 2012    left;  inflammatory  . Headache   . Obesity    Past Surgical History  Procedure Laterality Date  . Mastectomy Bilateral 05/12/11    total mastectomy on right, radical mastectomy on left  . Portacath placement    . Laparoscopy  03/14/2012    Procedure: LAPAROSCOPY OPERATIVE;  Surgeon: Osborne Oman, MD;  Location: [facility];  Service: Gynecology;  Laterality: N/A;  . Salpingoophorectomy  03/14/2012    Procedure: SALPINGO OOPHERECTOMY;  Surgeon: Osborne Oman, MD;  Location: [facility];  Service: Gynecology;  Laterality: Bilateral;  . Port-a-cath removal  04/19/2012    Procedure: MINOR REMOVAL PORT-A-CATH;  Surgeon: Haywood Lasso, MD;  Location: [facility];  Service: General;  Laterality: N/A;  . Oophorectomy    . Portacath      Placement     reports that she has never smoked. She has never used smokeless tobacco. She reports that she drinks alcohol. She reports that she does not use illicit drugs. family history includes Breast cancer in her mother; Diabetes in her mother; Heart disease in her father; Hypertension in her father; Pancreatic cancer in her mother; Stroke in her father. There is no history of Colon cancer, Esophageal cancer, Colitis, or Crohn's disease. Allergies  Allergen Reactions  . Doxycycline Hydrochloride Nausea Only  Outpatient Encounter Prescriptions as of 06/07/2015  Medication Sig  . ALPRAZolam (XANAX) 0.5 MG tablet Take 1 tablet (0.5 mg total) by mouth at bedtime as needed for sleep.  Marland Kitchen anastrozole (ARIMIDEX) 1 MG tablet Take 1 tablet (1 mg total) by mouth daily.  . Calcium 200 MG TABS Take 200 mg by mouth daily.   . cholecalciferol (VITAMIN D) 1000 UNITS tablet Take 1,000 Units by mouth daily.   . citalopram (CELEXA) 40 MG tablet TAKE 1 TABLET BY MOUTH DAILY.  . fluocinonide cream (LIDEX) 0.94 % Apply 1 application topically 2 (two) times daily as needed (for rash.).  Marland Kitchen gabapentin (NEURONTIN) 300 MG capsule Take 1 capsule (300 mg total) by mouth  daily.  Marland Kitchen ibuprofen (ADVIL,MOTRIN) 200 MG tablet Take 600 mg by mouth every 8 (eight) hours as needed for moderate pain (pain).   . naproxen sodium (ANAPROX) 220 MG tablet Take 220-440 mg by mouth 2 (two) times daily as needed (for muscle aches.).   Marland Kitchen omeprazole (PRILOSEC) 20 MG capsule Take 1 capsule (20 mg total) by mouth daily.  . ondansetron (ZOFRAN) 4 MG tablet Take 1 tablet (4 mg total) by mouth every 8 (eight) hours as needed for nausea or vomiting.  . prochlorperazine (COMPAZINE) 10 MG tablet Take 10 mg by mouth every 6 (six) hours as needed (for nausea.).   Marland Kitchen promethazine (PHENERGAN) 25 MG tablet Take 1 tablet (25 mg total) by mouth every 6 (six) hours as needed for nausea.  . ranitidine (ZANTAC) 150 MG capsule Take 1 capsule (150 mg total) by mouth 2 (two) times daily.  . SUMAtriptan (IMITREX) 50 MG tablet Take 1 tablet (50 mg total) by mouth every 2 (two) hours as needed for migraine. May repeat in 2 hours if headache persists or recurs.  Marland Kitchen UNABLE TO FIND Dispense compression sleeve 20-30 mm for this patient with history of breast cancer s/p surgery  . Na Sulfate-K Sulfate-Mg Sulf SOLN Use as directed per colonoscopy.   No facility-administered encounter medications on file as of 06/07/2015.     REVIEW OF SYSTEMS  : All other systems reviewed and negative except where noted in the History of Present Illness.   PHYSICAL EXAM: BP 110/80 mmHg  Pulse 88  Ht 5\' 2"  (1.575 m)  Wt 229 lb (103.874 kg)  BMI 41.87 kg/m2 General: Well developed white female in no acute distress Head: Normocephalic and atraumatic Eyes:  Sclerae anicteric, conjunctiva pink. Ears: Normal auditory acuity Lungs: Clear throughout to auscultation Heart: Regular rate and rhythm Abdomen: Soft, non-distended.  Normal bowel sounds.  Mild diffuse TTP > in the upper abdomen without R/R/G. Musculoskeletal: Symmetrical with no gross deformities  Skin: No lesions on visible extremities Extremities: No edema    Neurological: Alert oriented x 4, grossly non-focal Psychological:  Alert and cooperative. Normal mood and affect  ASSESSMENT AND PLAN: -Screening colonoscopy:  Will schedule with Dr. Fuller Plan. -Nausea, poor appetite, early satiety, upper abdominal pain, and bloating:  Will schedule for EGD as well.  CT abdomen/pelvis, abdominal ultrasound, and head CT recently unremarkable.  Unsure of the cause of her symptoms.  Continue anti-emetics prn for now.  *The risks, benefits, and alternatives to EGD and colonoscopy were discussed with the patient and she consents to proceed.    CC:  Mackie Pai, PA-C  CC:  Dr. Nicholas Lose

## 2015-06-09 ENCOUNTER — Encounter: Payer: Self-pay | Admitting: Neurology

## 2015-06-09 ENCOUNTER — Other Ambulatory Visit (INDEPENDENT_AMBULATORY_CARE_PROVIDER_SITE_OTHER): Payer: Medicare Other

## 2015-06-09 ENCOUNTER — Ambulatory Visit (INDEPENDENT_AMBULATORY_CARE_PROVIDER_SITE_OTHER): Payer: Medicare Other | Admitting: Neurology

## 2015-06-09 VITALS — BP 120/88 | HR 114 | Ht 62.0 in | Wt 229.4 lb

## 2015-06-09 DIAGNOSIS — R292 Abnormal reflex: Secondary | ICD-10-CM

## 2015-06-09 DIAGNOSIS — C801 Malignant (primary) neoplasm, unspecified: Secondary | ICD-10-CM | POA: Diagnosis not present

## 2015-06-09 DIAGNOSIS — R51 Headache: Secondary | ICD-10-CM

## 2015-06-09 DIAGNOSIS — R110 Nausea: Secondary | ICD-10-CM | POA: Diagnosis not present

## 2015-06-09 DIAGNOSIS — G620 Drug-induced polyneuropathy: Secondary | ICD-10-CM | POA: Diagnosis not present

## 2015-06-09 DIAGNOSIS — T451X5A Adverse effect of antineoplastic and immunosuppressive drugs, initial encounter: Secondary | ICD-10-CM

## 2015-06-09 DIAGNOSIS — R519 Headache, unspecified: Secondary | ICD-10-CM

## 2015-06-09 LAB — VITAMIN B12: Vitamin B-12: 230 pg/mL (ref 211–911)

## 2015-06-09 LAB — SEDIMENTATION RATE: SED RATE: 29 mm/h — AB (ref 0–22)

## 2015-06-09 NOTE — Progress Notes (Signed)
[facility] Clinic Note - Initial Visit   Date: 06/09/2015  AKIRRA LACERDA MRN: [id number] DOB: July 11, 1961   Dear Mackie Pai, PA:  Thank you for your kind referral of ISABELLAROSE KOPE for consultation of headaches. Although her history is well known to you, please allow [name] to reiterate it for the purpose of our medical record. The patient was accompanied to the clinic by self.   History of Present Illness: KATALIA CHOMA is a 54 y.o. right-handed Caucasian female with GERD, OSA, persistent nausea, left breast cancer s/p double mastectomy, chemo/radiation (2012), depression, and chemotherapy induced neuropathy presenting for evaluation of headaches.    Around June 2016, she developed left temporal throbbing pain radiating into the back of the eye.  Headaches last about 3-6 hours and occur 3-4 times per week. No associated photophobia, phonophobia, or vomiting.  She does have nausea. Rest and sleep improves pain. Headaches are worse when laying down and improved by standing.  Headaches do not wake her up from sleeping and worse in the evening.  Coughing or sneezing does not exacerbate pain.  No vision/hearing changes.   She has tried Aleve which usually helps.  She is taking it about three times per week, but has been limiting the use because of her abdominal pain and nausea.    In the past, she reports to having hormonal migraines which were associated with photophobia, phonophobia, and occuring more on the right side of the head.  These stopped several years ago.  She also has a history of chemotherapy-induced neuropathy with numbness/tingling of the feet and hands.  No associated weakness.  She takes gabapentin $RemoveBeforeDE'300mg'[id number]$  qhs which controls her pain.   Out-side paper records, electronic medical record, and images have been reviewed where available and summarized as: Lab Results  Component Value Date   TSH 1.70 05/21/2015   MRI brain wwo contrst 12/02/2010:  Negative for  metastatic disease. No significant abnormality.  CT head 05/18/2015:  No intracranial hemorrhage.  No unenhanced CT evidence of intracranial metastatic disease. If metastatic further delineation for the possibility of intracranial disease is clinically desired, contrast-enhanced MR imaging may then be considered.   Past Medical History  Diagnosis Date  . Frequent urination   . Depression   . Sleep apnea   . Eczema   . Neuromuscular disorder   . GERD (gastroesophageal reflux disease)   . 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 Lymphedema   . Anxiety   . Thyroid disease     Possible would like to be evaluated  . Breast cancer, stage 3 2012    left; inflammatory  . Headache   . Obesity     Past Surgical History  Procedure Laterality Date  . Mastectomy Bilateral 05/12/11    total mastectomy on right, radical mastectomy on left  . Portacath placement    . Laparoscopy  03/14/2012    Procedure: LAPAROSCOPY OPERATIVE;  Surgeon: Osborne Oman, MD;  Location: [facility];  Service: Gynecology;  Laterality: N/A;  . Salpingoophorectomy  03/14/2012    Procedure: SALPINGO OOPHERECTOMY;  Surgeon: Osborne Oman, MD;  Location: [facility];  Service: Gynecology;  Laterality: Bilateral;  . Port-a-cath removal  04/19/2012    Procedure: MINOR REMOVAL PORT-A-CATH;  Surgeon: Haywood Lasso, MD;  Location: [place];  Service: General;  Laterality: N/A;  . Oophorectomy    . Portacath      Placement  Medications:  Outpatient Encounter Prescriptions as of 06/09/2015  Medication Sig Note  . ALPRAZolam (XANAX) 0.5 MG tablet Take 1 tablet (0.5 mg total) by mouth at bedtime as needed for sleep.   Marland Kitchen anastrozole (ARIMIDEX) 1 MG tablet Take 1 tablet (1 mg total) by mouth daily.   . Calcium 200 MG TABS Take 200 mg by mouth daily.    . cholecalciferol (VITAMIN D) 1000 UNITS tablet Take 1,000 Units by mouth daily.    . citalopram (CELEXA) 40 MG tablet TAKE 1 TABLET BY MOUTH  DAILY.   . fluocinonide cream (LIDEX) 4.97 % Apply 1 application topically 2 (two) times daily as needed (for rash.).   Marland Kitchen gabapentin (NEURONTIN) 300 MG capsule Take 1 capsule (300 mg total) by mouth daily.   Marland Kitchen ibuprofen (ADVIL,MOTRIN) 200 MG tablet Take 600 mg by mouth every 8 (eight) hours as needed for moderate pain (pain).    . naproxen sodium (ANAPROX) 220 MG tablet Take 220-440 mg by mouth 2 (two) times daily as needed (for muscle aches.).    Marland Kitchen omeprazole (PRILOSEC) 20 MG capsule Take 1 capsule (20 mg total) by mouth daily.   . ondansetron (ZOFRAN) 4 MG tablet Take 1 tablet (4 mg total) by mouth every 8 (eight) hours as needed for nausea or vomiting.   . prochlorperazine (COMPAZINE) 10 MG tablet Take 10 mg by mouth every 6 (six) hours as needed (for nausea.).    Marland Kitchen promethazine (PHENERGAN) 25 MG tablet Take 1 tablet (25 mg total) by mouth every 6 (six) hours as needed for nausea.   . ranitidine (ZANTAC) 150 MG capsule Take 1 capsule (150 mg total) by mouth 2 (two) times daily. 06/04/2015: Not yet started. New rx, need to pick up  . SUMAtriptan (IMITREX) 50 MG tablet Take 1 tablet (50 mg total) by mouth every 2 (two) hours as needed for migraine. May repeat in 2 hours if headache persists or recurs.   Marland Kitchen UNABLE TO FIND Dispense compression sleeve 20-30 mm for this patient with history of breast cancer s/p surgery   . [DISCONTINUED] Na Sulfate-K Sulfate-Mg Sulf SOLN Use as directed per colonoscopy.    No facility-administered encounter medications on file as of 06/09/2015.     Allergies:  Allergies  Allergen Reactions  . Doxycycline Hydrochloride Nausea Only    Family History: Family History  Problem Relation Age of Onset  . Breast cancer Mother   . Stroke Father   . Hypertension Father   . Pancreatic cancer Mother     pancreatic cancer x 2  . Diabetes Mother     after pancreas removed  . Heart disease Father   . Colon cancer Neg Hx   . Esophageal cancer Neg Hx   . Colitis Neg Hx    . Crohn's disease Neg Hx   . Healthy Sister   . Migraines Sister     Social History: History  Substance Use Topics  . Smoking status: Never Smoker   . Smokeless tobacco: Never Used  . Alcohol Use: 0.0 oz/week    0 Standard drinks or equivalent per week     Comment: rare   History   Social History Narrative   Lives alone in a one story home.  Has no children.  Does not work.     On disability since 2013 for breast cancer.     Education: some college.    Review of Systems:  CONSTITUTIONAL: No fevers, chills, night sweats, or weight loss.   EYES: No  visual changes or eye pain ENT: No hearing changes.  No history of nose bleeds.   RESPIRATORY: No cough, wheezing and shortness of breath.   CARDIOVASCULAR: Negative for chest pain, and palpitations.   GI: Negative for abdominal discomfort, blood in stools or black stools.  No recent change in bowel habits.   GU:  No history of incontinence.   MUSCLOSKELETAL: No history of joint pain or swelling.  No myalgias.   SKIN: Negative for lesions, rash, and itching.   HEMATOLOGY/ONCOLOGY: Negative for prolonged bleeding, bruising easily, and swollen nodes.  +history of cancer.   ENDOCRINE: Negative for cold or heat intolerance, polydipsia or goiter.   PSYCH:  +depression or anxiety symptoms.   NEURO: As Above.   Vital Signs:  BP 120/88 mmHg  Pulse 114  Ht $R'5\' 2"'yq$  (1.575 m)  Wt 229 lb 6 oz (104.044 kg)  BMI 41.94 kg/m2  SpO2 94% Pain Scale: 3 on a scale of 0-10   General Medical Exam:   General:  Well appearing, comfortable.   Eyes/ENT: see cranial nerve examination.   Neck: No masses appreciated.  Full range of motion without tenderness.  No carotid bruits. Respiratory:  Clear to auscultation, good air entry bilaterally.   Cardiac:  Regular rate and rhythm, no murmur.   Extremities:  No deformities, edema, or skin discoloration.  Skin:  No rashes or lesions.  Neurological Exam: MENTAL STATUS including orientation to time,  place, person, recent and remote memory, attention span and concentration, language, and fund of knowledge is normal.  Speech is not dysarthric.  CRANIAL NERVES: II:  No visual field defects.  Unremarkable fundi.   III-IV-VI: Pupils equal round and reactive to light.  Normal conjugate, extra-ocular eye movements in all directions of gaze.  No nystagmus.  No ptosis.   V:  Normal facial sensation.     VII:  Normal facial symmetry and movements.  No pathologic facial reflexes.  VIII:  Normal hearing and vestibular function.   IX-X:  Normal palatal movement.   XI:  Normal shoulder shrug and head rotation.   XII:  Normal tongue strength and range of motion, no deviation or fasciculation.  MOTOR:  No atrophy, fasciculations or abnormal movements.  No pronator drift.  Tone is normal.    Right Upper Extremity:    Left Upper Extremity:    Deltoid  5/5   Deltoid  5/5   Biceps  5/5   Biceps  5/5   Triceps  5/5   Triceps  5/5   Wrist extensors  5/5   Wrist extensors  5/5   Wrist flexors  5/5   Wrist flexors  5/5   Finger extensors  5/5   Finger extensors  5/5   Finger flexors  5/5   Finger flexors  5/5   Dorsal interossei  5/5   Dorsal interossei  5/5   Abductor pollicis  5/5   Abductor pollicis  5/5   Tone (Ashworth scale)  0  Tone (Ashworth scale)  0   Right Lower Extremity:    Left Lower Extremity:    Hip flexors  5/5   Hip flexors  5/5   Hip extensors  5/5   Hip extensors  5/5   Knee flexors  5/5   Knee flexors  5/5   Knee extensors  5/5   Knee extensors  5/5   Dorsiflexors  5/5   Dorsiflexors  5/5   Plantarflexors  5/5   Plantarflexors  5/5   Toe extensors  5/5   Toe extensors  5/5   Toe flexors  5/5   Toe flexors  5/5   Tone (Ashworth scale)  0  Tone (Ashworth scale)  0   MSRs:  Right                                                                 Left brachioradialis 2+  brachioradialis 2+  biceps 2+  biceps 3+  triceps 2+  triceps 2+  patellar 2+  patellar 3+  ankle jerk 2+   ankle jerk 2+  Hoffman no  Hoffman no  plantar response down  plantar response down   SENSORY:  Normal and symmetric perception of light touch, pinprick, vibration, and proprioception.  Romberg's sign absent.   COORDINATION/GAIT: Normal finger-to- nose-finger and heel-to-shin.  Intact rapid alternating movements bilaterally.  Able to rise from a chair without using arms.  Gait narrow based and stable. Tandem and stressed gait intact.    IMPRESSION: 1.  Episodic left temporal and retro orbital headaches.  Pain is dull and low-grade, but pain worse with laying which is atypical for headaches.  Exam also shows mildly asymmetrically brisk reflexes on the left.  Given that she is > 35 years of age, has history of cancer, and worsening headaches with laying supine, imaging of the brain and blood vessels will be ordered to rule out sinister possibilities.   I will also screen for temporal arteritis with ESR and vitamin B12 for her neuropathy and GI upset.  2.  Chemotherapy-induced neuropathy, improving.  She is maintained and well controlled on gabapentin 300mg  qhs.  Remarkably there are no features of neuropathy on exam.   PLAN/RECOMMENDATIONS:  1.  MRI/A brain MRA neck 2.  Check vitamin B12 and ESR 3.  For moderate headaches, take tylenol with benadryl 25mg .  Limit all rescue medications to twice per week. 4.  Alternatively, she may take magnesium oxide 400-600mg  daily or as needed for headaches.  5.  Return to clinic if symptoms worsen   The duration of this appointment visit was 45 minutes of face-to-face time with the patient.  Greater than 50% of this time was spent in counseling, explanation of diagnosis, planning of further management, and coordination of care.   Thank you for allowing me to participate in patient's care.  If I can answer any additional questions, I would be pleased to do so.    Sincerely,    Amair Shrout K. Posey Pronto, DO

## 2015-06-09 NOTE — Progress Notes (Signed)
Reviewed and agree with management plan.  Valerian Jewel T. Keena Heesch, MD FACG 

## 2015-06-09 NOTE — Telephone Encounter (Signed)
Let pt know 9/6 appt is fine.  Pt voiced unnerstanding.

## 2015-06-09 NOTE — Patient Instructions (Signed)
1.  MRI brain and vessel imaging of the neck and head 2.  Check blood work 3.  For your headaches, you can try taking tylenol with benadryl 25mg  and see if that helps.  Limit all rescue medications to twice per week. 4.  Alternatively, you may also try magnesium oxide 400-600mg  daily or as needed for headaches.  Keep in mind higher doses may cause loose stool 5.  Return to clinic as needed

## 2015-06-10 ENCOUNTER — Ambulatory Visit (HOSPITAL_COMMUNITY)
Admission: RE | Admit: 2015-06-10 | Discharge: 2015-06-10 | Disposition: A | Discharge status: Home / Self Care | Payer: Medicare Other | Source: Ambulatory Visit | Attending: Hematology and Oncology | Admitting: Hematology and Oncology

## 2015-06-10 ENCOUNTER — Encounter (HOSPITAL_COMMUNITY)
Admission: RE | Admit: 2015-06-10 | Discharge: 2015-06-10 | Disposition: A | Discharge status: Home / Self Care | Payer: Medicare Other | Source: Ambulatory Visit | Attending: Hematology and Oncology | Admitting: Hematology and Oncology

## 2015-06-10 ENCOUNTER — Telehealth: Payer: Self-pay | Admitting: *Deleted

## 2015-06-10 ENCOUNTER — Encounter (HOSPITAL_COMMUNITY): Payer: Self-pay

## 2015-06-10 DIAGNOSIS — D259 Leiomyoma of uterus, unspecified: Secondary | ICD-10-CM

## 2015-06-10 DIAGNOSIS — Z9013 Acquired absence of bilateral breasts and nipples: Secondary | ICD-10-CM | POA: Insufficient documentation

## 2015-06-10 DIAGNOSIS — R591 Generalized enlarged lymph nodes: Secondary | ICD-10-CM

## 2015-06-10 DIAGNOSIS — Z9221 Personal history of antineoplastic chemotherapy: Secondary | ICD-10-CM | POA: Insufficient documentation

## 2015-06-10 DIAGNOSIS — K760 Fatty (change of) liver, not elsewhere classified: Secondary | ICD-10-CM | POA: Insufficient documentation

## 2015-06-10 DIAGNOSIS — J90 Pleural effusion, not elsewhere classified: Secondary | ICD-10-CM | POA: Diagnosis not present

## 2015-06-10 DIAGNOSIS — R590 Localized enlarged lymph nodes: Secondary | ICD-10-CM | POA: Diagnosis not present

## 2015-06-10 DIAGNOSIS — C50912 Malignant neoplasm of unspecified site of left female breast: Secondary | ICD-10-CM | POA: Diagnosis not present

## 2015-06-10 DIAGNOSIS — I700 Atherosclerosis of aorta: Secondary | ICD-10-CM | POA: Insufficient documentation

## 2015-06-10 DIAGNOSIS — Z853 Personal history of malignant neoplasm of breast: Secondary | ICD-10-CM

## 2015-06-10 DIAGNOSIS — R918 Other nonspecific abnormal finding of lung field: Secondary | ICD-10-CM

## 2015-06-10 DIAGNOSIS — C771 Secondary and unspecified malignant neoplasm of intrathoracic lymph nodes: Secondary | ICD-10-CM | POA: Insufficient documentation

## 2015-06-10 DIAGNOSIS — E042 Nontoxic multinodular goiter: Secondary | ICD-10-CM | POA: Insufficient documentation

## 2015-06-10 MED ORDER — TECHNETIUM TC 99M MEDRONATE IV KIT
24.6000 | PACK | Freq: Once | INTRAVENOUS | Status: AC | PRN
  Administered 2015-06-10: 24.6 via INTRAVENOUS

## 2015-06-10 MED ORDER — IOHEXOL 300 MG/ML  SOLN
100.0000 mL | Freq: Once | INTRAMUSCULAR | Status: AC | PRN
  Administered 2015-06-10: 100 mL via INTRAVENOUS

## 2015-06-10 NOTE — Telephone Encounter (Signed)
RESULTS CALLED AND FAXED REPORT RECEIVED. THIS WAS GIVEN TO [facility].

## 2015-06-11 ENCOUNTER — Other Ambulatory Visit: Payer: Self-pay | Admitting: Hematology and Oncology

## 2015-06-11 ENCOUNTER — Other Ambulatory Visit: Payer: Self-pay

## 2015-06-11 DIAGNOSIS — C50919 Malignant neoplasm of unspecified site of unspecified female breast: Secondary | ICD-10-CM

## 2015-06-11 DIAGNOSIS — R2231 Localized swelling, mass and lump, right upper limb: Secondary | ICD-10-CM

## 2015-06-11 DIAGNOSIS — C50912 Malignant neoplasm of unspecified site of left female breast: Secondary | ICD-10-CM

## 2015-06-11 MED ORDER — PROCHLORPERAZINE MALEATE 10 MG PO TABS: 10.0000 mg | ORAL_TABLET | Freq: Four times a day (QID) | ORAL | Status: DC | PRN

## 2015-06-11 NOTE — Telephone Encounter (Signed)
Per pt, phenergan is not helping with nausea.  Let pt know I would send refill for compazine.  Recommended patient stagger administration times of the nausea meds to keep something in her system at all times.  Pt voiced understanding.  Refill sent.

## 2015-06-15 ENCOUNTER — Telehealth: Payer: Self-pay | Admitting: Hematology and Oncology

## 2015-06-15 ENCOUNTER — Ambulatory Visit (HOSPITAL_BASED_OUTPATIENT_CLINIC_OR_DEPARTMENT_OTHER): Payer: Medicare Other | Admitting: Hematology and Oncology

## 2015-06-15 ENCOUNTER — Encounter: Payer: Self-pay | Admitting: Hematology and Oncology

## 2015-06-15 VITALS — BP 127/87 | HR 96 | Temp 98.7°F | Resp 18 | Ht 62.0 in | Wt 230.7 lb

## 2015-06-15 DIAGNOSIS — Z79810 Long term (current) use of selective estrogen receptor modulators (SERMs): Secondary | ICD-10-CM | POA: Diagnosis not present

## 2015-06-15 DIAGNOSIS — R110 Nausea: Secondary | ICD-10-CM

## 2015-06-15 DIAGNOSIS — C7951 Secondary malignant neoplasm of bone: Secondary | ICD-10-CM

## 2015-06-15 DIAGNOSIS — C7800 Secondary malignant neoplasm of unspecified lung: Secondary | ICD-10-CM | POA: Diagnosis not present

## 2015-06-15 DIAGNOSIS — R6881 Early satiety: Secondary | ICD-10-CM

## 2015-06-15 DIAGNOSIS — C50912 Malignant neoplasm of unspecified site of left female breast: Secondary | ICD-10-CM

## 2015-06-15 DIAGNOSIS — C778 Secondary and unspecified malignant neoplasm of lymph nodes of multiple regions: Secondary | ICD-10-CM

## 2015-06-15 NOTE — Progress Notes (Signed)
Patient Care Team: Mackie Pai, PA-C as PCP - General (Physician Assistant) Thea Silversmith, MD as Consulting Physician (Radiation Oncology) Consuela Mimes, MD as Consulting Physician (Hematology and Oncology) Nicholas Lose, MD as Consulting Physician (Hematology and Oncology)  DIAGNOSIS: Breast cancer metastasized to bone   Staging form: Breast, AJCC 7th Edition     Pathologic: Stage IIIC (T4d, N3, cM0) - Signed by [name], MD on 01/19/2014   SUMMARY OF ONCOLOGIC HISTORY:   Breast cancer metastasized to bone   12/07/2010 - 04/12/2011 Neo-Adjuvant Chemotherapy Neoadjuvant FEC x6 followed by Taxotere x4   12/26/2010 Procedure Genetic testing showed mutation for BRCA2 2041insA   05/12/2011 Surgery Bilateral mastectomy with left axillary dissection 11/21 positive lymph nodes 5.8 cm tumor ER 95% PR 0% Ki-67 61% HER-2 negative ratio 1.39 T3, N3, 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   07/03/2011 - 08/17/2011 Radiation Therapy Radiation therapy to the chest and axilla   09/11/2011 -  Anti-estrogen oral therapy Tamoxifen later switched to Arimidex 07/2012   04/22/2012 Surgery Bilateral salpingo-oophorectomy   06/10/2015 Imaging Bone scan: uptake right lateral frontal bone, left malar region, manubrium, right 6,8 ribs; CT-CAP: New extensive infiltrate left & right hilar/mediastinal, right axillary, bil lower neck LN, 3.7 cm LUL consolidation, RML nodules,Lt Pl eff, RPLN    CHIEF COMPLIANT: Follow-up after recent scans  INTERVAL HISTORY: Tina Vaughan is a 54 year old with above-mentioned history of left breast cancer that was BRCA2 related was treated with neo-adjuvant chemotherapy followed by bilateral mastectomies and radiation followed by tamoxifen which to Arimidex and even underwent bilateral salpingo-oophorectomy. She came in complaining of generalized worsening symptoms especially nausea and vomiting. We obtained a CT chest abdomen pelvis and bone scan and she is here today to  discuss the results. I called her last week and discuss some of these test results. She had a palpable right axillary lymph node which is going to be biopsied this Friday. She complains of profound nausea and vomiting and she has to take antiemetics every 2-3 hours. She has not been eating much food. She has decreased appetite. She complains of pain in the skull bones as well as the ribs. There is a plan to obtain a brain MRI.  REVIEW OF SYSTEMS:   Constitutional: Denies fevers, chills or abnormal weight loss Eyes: Denies blurriness of vision Ears, nose, mouth, throat, and face: Denies mucositis or sore throat Respiratory: Denies cough, dyspnea or wheezes Cardiovascular: Denies palpitation, chest discomfort or lower extremity swelling Gastrointestinal:  Nausea and vomiting, decreased appetite Skin: Denies abnormal skin rashes Lymphatics: Denies new lymphadenopathy or easy bruising Neurological:Denies numbness, tingling or new weaknesses Behavioral/Psych: Mood is stable, no new changes  Breast: Enlarged right axillary lymph node All other systems were reviewed with the patient and are negative.  I have reviewed the past medical history, past surgical history, social history and family history with the patient and they are unchanged from previous note.  ALLERGIES:  is allergic to doxycycline hydrochloride.  MEDICATIONS:  Current Outpatient Prescriptions  Medication Sig Dispense Refill  . ALPRAZolam (XANAX) 0.5 MG tablet Take 1 tablet (0.5 mg total) by mouth at bedtime as needed for sleep. 30 tablet 0  . anastrozole (ARIMIDEX) 1 MG tablet Take 1 tablet (1 mg total) by mouth daily. 90 tablet 6  . Calcium 200 MG TABS Take 200 mg by mouth daily.     . cholecalciferol (VITAMIN D) 1000 UNITS tablet Take 1,000 Units by mouth daily.     Marland Kitchen  citalopram (CELEXA) 40 MG tablet TAKE 1 TABLET BY MOUTH DAILY. 30 tablet 5  . fluocinonide cream (LIDEX) 0.03 % Apply 1 application topically 2 (two) times daily  as needed (for rash.).    Marland Kitchen gabapentin (NEURONTIN) 300 MG capsule Take 1 capsule (300 mg total) by mouth daily. 30 capsule 5  . ibuprofen (ADVIL,MOTRIN) 200 MG tablet Take 600 mg by mouth every 8 (eight) hours as needed for moderate pain (pain).     . naproxen sodium (ANAPROX) 220 MG tablet Take 220-440 mg by mouth 2 (two) times daily as needed (for muscle aches.).     Marland Kitchen omeprazole (PRILOSEC) 20 MG capsule Take 1 capsule (20 mg total) by mouth daily. 90 capsule 6  . ondansetron (ZOFRAN) 4 MG tablet Take 1 tablet (4 mg total) by mouth every 8 (eight) hours as needed for nausea or vomiting. 30 tablet 2  . prochlorperazine (COMPAZINE) 10 MG tablet Take 1 tablet (10 mg total) by mouth every 6 (six) hours as needed (for nausea.). 30 tablet 1  . promethazine (PHENERGAN) 25 MG tablet Take 1 tablet (25 mg total) by mouth every 6 (six) hours as needed for nausea. 30 tablet 1  . ranitidine (ZANTAC) 150 MG capsule Take 1 capsule (150 mg total) by mouth 2 (two) times daily. 60 capsule 0  . SUMAtriptan (IMITREX) 50 MG tablet Take 1 tablet (50 mg total) by mouth every 2 (two) hours as needed for migraine. May repeat in 2 hours if headache persists or recurs. 10 tablet 0  . UNABLE TO FIND Dispense compression sleeve 20-30 mm for this patient with history of breast cancer s/p surgery 1 each 0   No current facility-administered medications for this visit.    PHYSICAL EXAMINATION: ECOG PERFORMANCE STATUS: 1 - Symptomatic but completely ambulatory  Filed Vitals:   06/15/15 0813  BP: 127/87  Pulse: 96  Temp: 98.7 F (37.1 C)  Resp: 18   Filed Weights   06/15/15 0813  Weight: 230 lb 11.2 oz (104.645 kg)    GENERAL:alert, no distress and comfortable SKIN: skin color, texture, turgor are normal, no rashes or significant lesions EYES: normal, Conjunctiva are pink and non-injected, sclera clear OROPHARYNX:no exudate, no erythema and lips, buccal mucosa, and tongue normal  NECK: supple, thyroid normal size,  non-tender, without nodularity LYMPH:  no palpable lymphadenopathy in the cervical, axillary or inguinal LUNGS: clear to auscultation and percussion with normal breathing effort HEART: regular rate & rhythm and no murmurs and no lower extremity edema ABDOMEN:abdomen soft, non-tender and normal bowel sounds Musculoskeletal:no cyanosis of digits and no clubbing  NEURO: alert & oriented x 3 with fluent speech, no focal motor/sensory deficits  LABORATORY DATA:  I have reviewed the data as listed   Chemistry      Component Value Date/Time   NA 138 06/04/2015 2136   NA 141 05/18/2015 0950   K 3.9 06/04/2015 2136   K 4.0 05/18/2015 0950   CL 103 06/04/2015 2136   CL 108* 07/30/2012 1430   CO2 27 06/04/2015 2136   CO2 27 05/18/2015 0950   BUN 14 06/04/2015 2136   BUN 13.6 05/18/2015 0950   CREATININE 0.90 06/04/2015 2136   CREATININE 0.8 05/18/2015 0950      Component Value Date/Time   CALCIUM 9.6 06/04/2015 2136   CALCIUM 9.7 05/18/2015 0950   ALKPHOS 60 06/04/2015 1131   ALKPHOS 72 05/18/2015 0950   AST 18 06/04/2015 1131   AST 17 05/18/2015 0950   ALT 26  06/04/2015 1131   ALT 22 05/18/2015 0950   BILITOT 0.7 06/04/2015 1131   BILITOT 0.51 05/18/2015 0950       Lab Results  Component Value Date   WBC 6.8 06/04/2015   HGB 14.5 06/04/2015   HCT 42.3 06/04/2015   MCV 87.2 06/04/2015   PLT 214 06/04/2015   NEUTROABS 3.2 06/04/2015     RADIOGRAPHIC STUDIES: I have personally reviewed the radiology reports and agreed with their findings. Results are summarized as above  ASSESSMENT & PLAN:  Breast cancer metastasized to bone Left breast inflammatory breast cancer T4d, N3, M0 stage IIIc grade 2 left 21 positive lymph nodes, 5.8 cm tumor ER 95% PR 0% Ki-67 61% HER-2 negative. BRCA 2 mutation underwent salpingo-oophorectomy Status post bilateral mastectomies and radiation to left chest wall and axilla currently on tamoxifen since October 2012 Metastatic breast cancer:   Bone scan 06/10/2015: uptake right lateral frontal bone, left malar region, manubrium, right 6,8 ribs;  CT-CAP: New extensive infiltrate left & right hilar/mediastinal, right axillary, bil lower neck LN, 3.7 cm LUL consolidation, RML nodules,Lt Pl eff, RPLN  Radiology review: I discussed the results of the bone scan and CT chest abdomen pelvis which showed extensive metastatic disease involving bones, lymph nodes, lung. I provided her with a copy of this report.  Recommendation: 1. Obtain a biopsy of the axillary lymph nodes. I spoke with Dr. Nyoka Cowden with radiology who was willing to do this biopsy. We will have to assess for the breast prognostic panel. 2. Further therapy depending on the receptor status. I briefly discussed the role of Ibrance with letrozole for metastatic ER positive breast cancers, I also discussed the role of chemotherapy for triple negative breast cancers.  I will see her back after the biopsy to come up with a final treatment plan.   Orders Placed This Encounter  Procedures  . DG Chest 1 View    Standing Status: Future     Number of Occurrences:      Standing Expiration Date: 06/14/2016    Order Specific Question:  Reason for Exam (SYMPTOM  OR DIAGNOSIS REQUIRED)    Answer:  Bone lesions further assessment    Order Specific Question:  Is the patient pregnant?    Answer:  No    Order Specific Question:  Preferred imaging location?    Answer:  [facility]  . DG Skull 1-3 Views    Standing Status: Future     Number of Occurrences:      Standing Expiration Date: 08/14/2016    Order Specific Question:  Reason for Exam (SYMPTOM  OR DIAGNOSIS REQUIRED)    Answer:  Bone lesions further assessment    Order Specific Question:  Is the patient pregnant?    Answer:  No    Order Specific Question:  Preferred imaging location?    Answer:  [facility]   The patient has a good understanding of the overall plan. she agrees with it. she will call with any  problems that may develop before the next visit here.   Rulon Eisenmenger, MD

## 2015-06-15 NOTE — Telephone Encounter (Signed)
Appointments made and avs printed for patient °

## 2015-06-15 NOTE — Assessment & Plan Note (Addendum)
Left breast inflammatory breast cancer T4d, N3, M0 stage IIIc grade 2 left 21 positive lymph nodes, 5.8 cm tumor ER 95% PR 0% Ki-67 61% HER-2 negative. BRCA 2 mutation underwent salpingo-oophorectomy Status post bilateral mastectomies and radiation to left chest wall and axilla currently on tamoxifen since October 2012 Metastatic breast cancer:  Bone scan 06/10/2015: uptake right lateral frontal bone, left malar region, manubrium, right 6,8 ribs;  CT-CAP: New extensive infiltrate left & right hilar/mediastinal, right axillary, bil lower neck LN, 3.7 cm LUL consolidation, RML nodules,Lt Pl eff, RPLN  Radiology review: I discussed the results of the bone scan and CT chest abdomen pelvis which showed extensive metastatic disease involving bones, lymph nodes, lung. I provided her with a copy of this report.  Recommendation: 1. Obtain a biopsy of the axillary lymph nodes. I spoke with Dr. Nyoka Cowden with radiology who was willing to do this biopsy. We will have to assess for the breast prognostic panel. 2. Further therapy depending on the receptor status. I briefly discussed the role of Ibrance with letrozole for metastatic ER positive breast cancers, I also discussed the role of chemotherapy for triple negative breast cancers.  I will see her back after the biopsy to come up with a final treatment plan.

## 2015-06-16 ENCOUNTER — Telehealth: Payer: Self-pay

## 2015-06-16 ENCOUNTER — Encounter: Payer: Self-pay | Admitting: *Deleted

## 2015-06-16 ENCOUNTER — Other Ambulatory Visit: Payer: Self-pay

## 2015-06-16 DIAGNOSIS — C50919 Malignant neoplasm of unspecified site of unspecified female breast: Secondary | ICD-10-CM

## 2015-06-16 DIAGNOSIS — C7951 Secondary malignant neoplasm of bone: Principal | ICD-10-CM

## 2015-06-16 MED ORDER — SCOPOLAMINE 1 MG/3DAYS TD PT72: 1.0000 | MEDICATED_PATCH | TRANSDERMAL | Status: DC

## 2015-06-16 NOTE — Telephone Encounter (Signed)
Confirmed with patient that she is going tomorrow 8/4 to have her xrays done.   Per Dr. Lindi Adie, scopolamine patch for nausea.  Order placed.

## 2015-06-17 ENCOUNTER — Ambulatory Visit
Admission: RE | Admit: 2015-06-17 | Discharge: 2015-06-17 | Disposition: A | Discharge status: Home / Self Care | Payer: Medicare Other | Source: Ambulatory Visit | Attending: Neurology | Admitting: Neurology

## 2015-06-17 ENCOUNTER — Other Ambulatory Visit: Payer: Medicare Other

## 2015-06-17 ENCOUNTER — Ambulatory Visit (HOSPITAL_COMMUNITY)
Admission: RE | Admit: 2015-06-17 | Discharge: 2015-06-17 | Disposition: A | Discharge status: Home / Self Care | Payer: Medicare Other | Source: Ambulatory Visit | Attending: Hematology and Oncology | Admitting: Hematology and Oncology

## 2015-06-17 DIAGNOSIS — C50919 Malignant neoplasm of unspecified site of unspecified female breast: Secondary | ICD-10-CM | POA: Diagnosis not present

## 2015-06-17 DIAGNOSIS — R918 Other nonspecific abnormal finding of lung field: Secondary | ICD-10-CM | POA: Diagnosis not present

## 2015-06-17 DIAGNOSIS — C7951 Secondary malignant neoplasm of bone: Secondary | ICD-10-CM

## 2015-06-17 DIAGNOSIS — C50912 Malignant neoplasm of unspecified site of left female breast: Secondary | ICD-10-CM | POA: Insufficient documentation

## 2015-06-17 DIAGNOSIS — R51 Headache: Secondary | ICD-10-CM | POA: Diagnosis not present

## 2015-06-17 DIAGNOSIS — R0602 Shortness of breath: Secondary | ICD-10-CM | POA: Insufficient documentation

## 2015-06-17 DIAGNOSIS — R05 Cough: Secondary | ICD-10-CM | POA: Diagnosis not present

## 2015-06-17 DIAGNOSIS — R938 Abnormal findings on diagnostic imaging of other specified body structures: Secondary | ICD-10-CM | POA: Diagnosis not present

## 2015-06-17 MED ORDER — GADOBENATE DIMEGLUMINE 529 MG/ML IV SOLN
20.0000 mL | Freq: Once | INTRAVENOUS | Status: AC | PRN
  Administered 2015-06-17: 20 mL via INTRAVENOUS

## 2015-06-18 ENCOUNTER — Ambulatory Visit
Admission: RE | Admit: 2015-06-18 | Discharge: 2015-06-18 | Disposition: A | Discharge status: Home / Self Care | Payer: Medicare Other | Source: Ambulatory Visit | Attending: Hematology and Oncology | Admitting: Hematology and Oncology

## 2015-06-18 ENCOUNTER — Telehealth: Payer: Self-pay | Admitting: *Deleted

## 2015-06-18 ENCOUNTER — Other Ambulatory Visit: Payer: Self-pay | Admitting: Hematology and Oncology

## 2015-06-18 ENCOUNTER — Telehealth: Payer: Self-pay | Admitting: Neurology

## 2015-06-18 DIAGNOSIS — C50912 Malignant neoplasm of unspecified site of left female breast: Secondary | ICD-10-CM | POA: Diagnosis not present

## 2015-06-18 DIAGNOSIS — N63 Unspecified lump in unspecified breast: Secondary | ICD-10-CM

## 2015-06-18 DIAGNOSIS — R2231 Localized swelling, mass and lump, right upper limb: Secondary | ICD-10-CM

## 2015-06-18 DIAGNOSIS — C773 Secondary and unspecified malignant neoplasm of axilla and upper limb lymph nodes: Secondary | ICD-10-CM | POA: Diagnosis not present

## 2015-06-18 NOTE — Telephone Encounter (Signed)
Patient called with c/o abd and back pain. States she is taking Aleve which is not helping. Advised patient that Dr. Lindi Adie was out of the office but I would leave a message for him in reference to pain medicine. Advised patient that if needed she may need to go to the ER if pain is not tolerable. She verbalized understanding.

## 2015-06-18 NOTE — Telephone Encounter (Signed)
MRI brain results personally discussed with patient.  No intracranial metastasis.  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   I expressed my sympathy for the recurrence of her cancer and will be available, as needed.    Jenean Escandon K. Posey Pronto, DO

## 2015-06-21 ENCOUNTER — Other Ambulatory Visit: Payer: Self-pay

## 2015-06-21 DIAGNOSIS — C50919 Malignant neoplasm of unspecified site of unspecified female breast: Secondary | ICD-10-CM

## 2015-06-21 DIAGNOSIS — C7951 Secondary malignant neoplasm of bone: Principal | ICD-10-CM

## 2015-06-21 MED ORDER — TRAMADOL HCL 50 MG PO TABS: 50.0000 mg | ORAL_TABLET | Freq: Three times a day (TID) | ORAL | Status: DC | PRN

## 2015-06-21 NOTE — Progress Notes (Signed)
Ultram order faxed to [place].  Sent to scan.   LMOVM for patient that prescription had been sent.

## 2015-06-22 ENCOUNTER — Other Ambulatory Visit: Payer: Medicare Other

## 2015-06-24 ENCOUNTER — Ambulatory Visit (HOSPITAL_BASED_OUTPATIENT_CLINIC_OR_DEPARTMENT_OTHER): Payer: Medicare Other | Admitting: Hematology and Oncology

## 2015-06-24 ENCOUNTER — Other Ambulatory Visit: Payer: Self-pay | Admitting: Hematology and Oncology

## 2015-06-24 ENCOUNTER — Telehealth: Payer: Self-pay | Admitting: Hematology and Oncology

## 2015-06-24 ENCOUNTER — Other Ambulatory Visit: Payer: Self-pay

## 2015-06-24 ENCOUNTER — Encounter: Payer: Self-pay | Admitting: Hematology and Oncology

## 2015-06-24 ENCOUNTER — Telehealth: Payer: Self-pay

## 2015-06-24 VITALS — BP 127/90 | HR 112 | Temp 98.4°F | Resp 18 | Ht 62.0 in | Wt 225.8 lb

## 2015-06-24 DIAGNOSIS — C50919 Malignant neoplasm of unspecified site of unspecified female breast: Secondary | ICD-10-CM

## 2015-06-24 DIAGNOSIS — C7951 Secondary malignant neoplasm of bone: Secondary | ICD-10-CM

## 2015-06-24 DIAGNOSIS — C799 Secondary malignant neoplasm of unspecified site: Principal | ICD-10-CM

## 2015-06-24 MED ORDER — PROCHLORPERAZINE MALEATE 10 MG PO TABS: 10.0000 mg | ORAL_TABLET | Freq: Four times a day (QID) | ORAL | Status: DC | PRN

## 2015-06-24 MED ORDER — ONDANSETRON HCL 8 MG PO TABS: 8.0000 mg | ORAL_TABLET | Freq: Two times a day (BID) | ORAL | Status: DC

## 2015-06-24 MED ORDER — LIDOCAINE-PRILOCAINE 2.5-2.5 % EX CREA: TOPICAL_CREAM | CUTANEOUS | Status: DC

## 2015-06-24 MED ORDER — LORAZEPAM 0.5 MG PO TABS: 0.5000 mg | ORAL_TABLET | Freq: Every day | ORAL | Status: DC

## 2015-06-24 NOTE — Telephone Encounter (Signed)
Gave avs  Calendar for August

## 2015-06-24 NOTE — Progress Notes (Signed)
Pre-treatment cycle orders released.  Ativan called in to [facility].

## 2015-06-24 NOTE — Assessment & Plan Note (Signed)
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 BRCA 2 mutation underwent salpingo-oophorectomy Status post bilateral mastectomies and radiation to left chest wall and axilla currently on tamoxifen since October 2012)  Pathology review: I discussed the final pathology report of the patient and provided her with a copy of this report Treatment plan: Ibrance with letrozole Ibrance: I discussed the risks and benefits of Ibrance including myelosuppression especially neutropenia and with that risk of infection, there is risk of pulmonary embolism and mild peripheral neuropathy as well. Fatigue, nausea, diarrhea, decreased appetite as well as alopecia and thrombocytopenia are also potential side effects of Ibrance  Return to clinic in 3 weeks for blood count check on this therapy.

## 2015-06-24 NOTE — Telephone Encounter (Signed)
-----   Message from Milon Dikes sent at 06/24/2015 10:29 AM EDT ----- Tina Vaughan is authorized, Thanks

## 2015-06-24 NOTE — Progress Notes (Signed)
Patient Care Team: Mackie Pai, PA-C as PCP - General (Physician Assistant) Thea Silversmith, MD as Consulting Physician (Radiation Oncology) Consuela Mimes, MD as Consulting Physician (Hematology and Oncology) Nicholas Lose, MD as Consulting Physician (Hematology and Oncology)  DIAGNOSIS: Breast cancer metastasized to bone   Staging form: Breast, AJCC 7th Edition     Pathologic: Stage IIIC (T4d, N3, cM0) - Signed by [name], MD on 01/19/2014   SUMMARY OF ONCOLOGIC HISTORY:   Breast cancer metastasized to bone   12/07/2010 - 04/12/2011 Neo-Adjuvant Chemotherapy Neoadjuvant FEC x6 followed by Taxotere x4   12/26/2010 Procedure Genetic testing showed mutation for BRCA2 2041insA   05/12/2011 Surgery Bilateral mastectomy with left axillary dissection 11/21 positive lymph nodes 5.8 cm tumor ER 95% PR 0% Ki-67 61% HER-2 negative ratio 1.39 T3, N3, 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   07/03/2011 - 08/17/2011 Radiation Therapy Radiation therapy to the chest and axilla   09/11/2011 -  Anti-estrogen oral therapy Tamoxifen later switched to Arimidex 07/2012   04/22/2012 Surgery Bilateral salpingo-oophorectomy   06/10/2015 Imaging Bone scan: uptake right lateral frontal bone, left malar region, manubrium, right 6,8 ribs; CT-CAP: New extensive infiltrate left & right hilar/mediastinal, right axillary, bil lower neck LN, 3.7 cm LUL consolidation, RML nodules,Lt Pl eff, RPLN   06/18/2015 Initial Biopsy Right axillary lymph node biopsy: 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 She has noticed increasing shortness of breath with exertion over the past with 3 days. She also noticed some hoarseness of voice. She complained of a dry cough  that is not productive. Denies any fevers or chills. Chest x-ray done on 06/17/2015 showed postobstructive changes.  REVIEW OF SYSTEMS:   Constitutional: Denies fevers, chills or abnormal weight loss Eyes: Denies blurriness of vision Ears, nose, mouth, throat, and face: Denies mucositis or sore throat Respiratory: Shortness of breath, cough Cardiovascular: Denies palpitation, chest discomfort or lower extremity swelling Gastrointestinal:  Denies nausea, heartburn or change in bowel habits Skin: Denies abnormal skin rashes Lymphatics: Denies new lymphadenopathy or easy bruising Neurological:Denies numbness, tingling or new weaknesses Behavioral/Psych: Mood is stable, no new changes  All other systems were reviewed with the patient and are negative.  I have reviewed the past medical history, past surgical history, social history and family history with the patient and they are unchanged from previous note.  ALLERGIES:  is allergic to acyclovir and related and doxycycline hydrochloride.  MEDICATIONS:  Current Outpatient Prescriptions  Medication Sig Dispense Refill  . ALPRAZolam (XANAX) 0.5 MG tablet Take 1 tablet (0.5 mg total) by mouth at bedtime as needed for sleep. 30 tablet 0  . anastrozole (ARIMIDEX) 1 MG tablet Take 1 tablet (1 mg total) by mouth daily. 90 tablet 6  . Calcium 200 MG TABS Take 200 mg by mouth daily.     . cholecalciferol (VITAMIN D) 1000 UNITS tablet Take 1,000 Units by mouth daily.     . citalopram (CELEXA) 40 MG tablet TAKE 1 TABLET BY MOUTH DAILY. 30 tablet 5  . fluocinonide cream (LIDEX) 6.29 % Apply 1 application topically 2 (two) times daily as needed (for rash.).    Marland Kitchen gabapentin (NEURONTIN) 300 MG capsule Take 1 capsule (300 mg total) by mouth daily. 30 capsule  5  . ibuprofen (ADVIL,MOTRIN) 200 MG tablet Take 600 mg by mouth every 8 (eight) hours as needed for moderate pain (pain).     . naproxen sodium (ANAPROX) 220 MG tablet Take 220-440 mg by mouth 2 (two)  times daily as needed (for muscle aches.).     Marland Kitchen omeprazole (PRILOSEC) 20 MG capsule Take 1 capsule (20 mg total) by mouth daily. 90 capsule 6  . ondansetron (ZOFRAN) 4 MG tablet Take 1 tablet (4 mg total) by mouth every 8 (eight) hours as needed for nausea or vomiting. 30 tablet 2  . prochlorperazine (COMPAZINE) 10 MG tablet Take 1 tablet (10 mg total) by mouth every 6 (six) hours as needed (for nausea.). 30 tablet 1  . promethazine (PHENERGAN) 25 MG tablet Take 1 tablet (25 mg total) by mouth every 6 (six) hours as needed for nausea. 30 tablet 1  . ranitidine (ZANTAC) 150 MG capsule Take 1 capsule (150 mg total) by mouth 2 (two) times daily. 60 capsule 0  . scopolamine (TRANSDERM-SCOP, 1.5 MG,) 1 MG/3DAYS Place 1 patch (1.5 mg total) onto the skin every 3 (three) days. 10 patch 12  . SUMAtriptan (IMITREX) 50 MG tablet Take 1 tablet (50 mg total) by mouth every 2 (two) hours as needed for migraine. May repeat in 2 hours if headache persists or recurs. 10 tablet 0  . traMADol (ULTRAM) 50 MG tablet Take 1 tablet (50 mg total) by mouth 3 (three) times daily as needed. 30 tablet 0  . UNABLE TO FIND Dispense compression sleeve 20-30 mm for this patient with history of breast cancer s/p surgery 1 each 0   No current facility-administered medications for this visit.    PHYSICAL EXAMINATION: ECOG PERFORMANCE STATUS: 1 - Symptomatic but completely ambulatory  Filed Vitals:   06/24/15 0957  BP: 127/90  Pulse: 112  Temp: 98.4 F (36.9 C)  Resp: 18   Filed Weights   06/24/15 0957  Weight: 225 lb 12.8 oz (102.422 kg)    GENERAL:alert, no distress and comfortable SKIN: skin color, texture, turgor are normal, no rashes or significant lesions EYES: normal, Conjunctiva are pink and non-injected, sclera clear OROPHARYNX:no exudate, no erythema and lips, buccal mucosa, and tongue normal  NECK: supple, thyroid normal size, non-tender, without nodularity LYMPH:  no palpable lymphadenopathy in the  cervical, axillary or inguinal LUNGS: clear to auscultation and percussion with normal breathing effort HEART: regular rate & rhythm and no murmurs and no lower extremity edema ABDOMEN:abdomen soft, non-tender and normal bowel sounds Musculoskeletal:no cyanosis of digits and no clubbing  NEURO: alert & oriented x 3 with fluent speech, no focal motor/sensory deficits  LABORATORY DATA:  I have reviewed the data as listed   Chemistry      Component Value Date/Time   NA 138 06/04/2015 2136   NA 141 05/18/2015 0950   K 3.9 06/04/2015 2136   K 4.0 05/18/2015 0950   CL 103 06/04/2015 2136   CL 108* 07/30/2012 1430   CO2 27 06/04/2015 2136   CO2 27 05/18/2015 0950   BUN 14 06/04/2015 2136   BUN 13.6 05/18/2015 0950   CREATININE 0.90 06/04/2015 2136   CREATININE 0.8 05/18/2015 0950      Component Value Date/Time   CALCIUM 9.6 06/04/2015 2136   CALCIUM 9.7 05/18/2015 0950   ALKPHOS 60 06/04/2015 1131   ALKPHOS 72 05/18/2015 0950   AST 18 06/04/2015 1131   AST 17 05/18/2015 0950   ALT 26 06/04/2015 1131   ALT  22 05/18/2015 0950   BILITOT 0.7 06/04/2015 1131   BILITOT 0.51 05/18/2015 0950       Lab Results  Component Value Date   WBC 6.8 06/04/2015   HGB 14.5 06/04/2015   HCT 42.3 06/04/2015   MCV 87.2 06/04/2015   PLT 214 06/04/2015   NEUTROABS 3.2 06/04/2015   ASSESSMENT & PLAN:  Breast cancer metastasized to bone Right axillary lymph node biopsy 06/18/2015: 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 BRCA 2 mutation  underwent salpingo-oophorectomy Status post bilateral mastectomies and radiation to left chest wall and axilla currently on tamoxifen since October 2012) ----------------------------------------------------------------------------------------------------------------------------------------------------------------- Pathology review: I discussed the final pathology report of the patient and provided her with a copy of this report. Counseling: Patient started to have respiratory issues starting this week accompanied by hoarseness of voice. Because this reason, we elected to use chemotherapy instead of oral antiestrogen treatments. Secondly age and has progressed on oral and aromatase inhibitor therapy. Hence I believe chemotherapy is necessary.  Treatment plan: [place] day 1 and day 8 every 3 weeks 6 cycles following which we will consider starting letrozole with Ibrance 1. Port placement on Monday 2. Start chemotherapy and Tuesday  Return to clinic in 2 weeks for cycle 1 day 8 of treatment and for toxicity check.   No orders of the defined types were placed in this encounter.   The patient has a good understanding of the overall plan. she agrees with it. she will call with any problems that may develop before the next visit here.   Rulon Eisenmenger, MD

## 2015-06-25 ENCOUNTER — Other Ambulatory Visit: Payer: Self-pay | Admitting: Radiology

## 2015-06-28 ENCOUNTER — Ambulatory Visit (HOSPITAL_COMMUNITY)
Admission: RE | Admit: 2015-06-28 | Discharge: 2015-06-28 | Disposition: A | Discharge status: Home / Self Care | Payer: Medicare Other | Source: Ambulatory Visit | Attending: Hematology and Oncology | Admitting: Hematology and Oncology

## 2015-06-28 ENCOUNTER — Other Ambulatory Visit: Payer: Self-pay | Admitting: Hematology and Oncology

## 2015-06-28 ENCOUNTER — Encounter (HOSPITAL_COMMUNITY): Payer: Self-pay

## 2015-06-28 DIAGNOSIS — C799 Secondary malignant neoplasm of unspecified site: Secondary | ICD-10-CM

## 2015-06-28 DIAGNOSIS — C50919 Malignant neoplasm of unspecified site of unspecified female breast: Secondary | ICD-10-CM | POA: Diagnosis not present

## 2015-06-28 DIAGNOSIS — G4733 Obstructive sleep apnea (adult) (pediatric): Secondary | ICD-10-CM | POA: Insufficient documentation

## 2015-06-28 DIAGNOSIS — C7981 Secondary malignant neoplasm of breast: Secondary | ICD-10-CM | POA: Diagnosis not present

## 2015-06-28 DIAGNOSIS — C801 Malignant (primary) neoplasm, unspecified: Secondary | ICD-10-CM | POA: Diagnosis not present

## 2015-06-28 LAB — CBC WITH DIFFERENTIAL/PLATELET
BASOS PCT: 0 % (ref 0–1)
Basophils Absolute: 0 10*3/uL (ref 0.0–0.1)
Eosinophils Absolute: 0.1 10*3/uL (ref 0.0–0.7)
Eosinophils Relative: 2 % (ref 0–5)
HEMATOCRIT: 41 % (ref 36.0–46.0)
Hemoglobin: 14 g/dL (ref 12.0–15.0)
LYMPHS ABS: 1.5 10*3/uL (ref 0.7–4.0)
Lymphocytes Relative: 30 % (ref 12–46)
MCH: 30.2 pg (ref 26.0–34.0)
MCHC: 34.1 g/dL (ref 30.0–36.0)
MCV: 88.6 fL (ref 78.0–100.0)
MONO ABS: 0.4 10*3/uL (ref 0.1–1.0)
MONOS PCT: 8 % (ref 3–12)
Neutro Abs: 3.1 10*3/uL (ref 1.7–7.7)
Neutrophils Relative %: 60 % (ref 43–77)
Platelets: 209 10*3/uL (ref 150–400)
RBC: 4.63 MIL/uL (ref 3.87–5.11)
RDW: 13.2 % (ref 11.5–15.5)
WBC: 5.1 10*3/uL (ref 4.0–10.5)

## 2015-06-28 LAB — PROTIME-INR
INR: 1 (ref 0.00–1.49)
Prothrombin Time: 13.4 seconds (ref 11.6–15.2)

## 2015-06-28 MED ORDER — LIDOCAINE HCL 1 % IJ SOLN
INTRAMUSCULAR | Status: AC
  Filled 2015-06-28: qty 20

## 2015-06-28 MED ORDER — SODIUM CHLORIDE 0.9 % IV SOLN
INTRAVENOUS | Status: DC
  Administered 2015-06-28: 14:00:00 via INTRAVENOUS

## 2015-06-28 MED ORDER — LIDOCAINE-EPINEPHRINE 2 %-1:100000 IJ SOLN
INTRAMUSCULAR | Status: AC
  Filled 2015-06-28: qty 1

## 2015-06-28 MED ORDER — CEFAZOLIN SODIUM-DEXTROSE 2-3 GM-% IV SOLR
INTRAVENOUS | Status: AC
  Administered 2015-06-28: 2 g via INTRAVENOUS
  Filled 2015-06-28: qty 50

## 2015-06-28 MED ORDER — FENTANYL CITRATE (PF) 100 MCG/2ML IJ SOLN
INTRAMUSCULAR | Status: AC | PRN
  Administered 2015-06-28: 25 ug via INTRAVENOUS
  Administered 2015-06-28: 50 ug via INTRAVENOUS
  Administered 2015-06-28: 25 ug via INTRAVENOUS

## 2015-06-28 MED ORDER — CEFAZOLIN SODIUM-DEXTROSE 2-3 GM-% IV SOLR
2.0000 g | Freq: Once | INTRAVENOUS | Status: AC
  Administered 2015-06-28: 2 g via INTRAVENOUS

## 2015-06-28 MED ORDER — MIDAZOLAM HCL 2 MG/2ML IJ SOLN
INTRAMUSCULAR | Status: AC | PRN
Start: 2015-06-28 — End: 2015-06-28
  Administered 2015-06-28 (×3): 0.5 mg via INTRAVENOUS
  Administered 2015-06-28: 1 mg via INTRAVENOUS
  Administered 2015-06-28 (×2): 0.5 mg via INTRAVENOUS

## 2015-06-28 MED ORDER — MIDAZOLAM HCL 2 MG/2ML IJ SOLN
INTRAMUSCULAR | Status: AC
  Filled 2015-06-28: qty 6

## 2015-06-28 MED ORDER — FENTANYL CITRATE (PF) 100 MCG/2ML IJ SOLN
INTRAMUSCULAR | Status: AC
  Filled 2015-06-28: qty 4

## 2015-06-28 MED ORDER — HEPARIN SOD (PORK) LOCK FLUSH 100 UNIT/ML IV SOLN
INTRAVENOUS | Status: AC
  Filled 2015-06-28: qty 5

## 2015-06-28 MED ORDER — HEPARIN SOD (PORK) LOCK FLUSH 100 UNIT/ML IV SOLN
INTRAVENOUS | Status: AC | PRN
  Administered 2015-06-28: 500 [IU]

## 2015-06-28 NOTE — H&P (Signed)
HPI: Patient with metastatic breast cancer seen by Dr. Lindi Adie and scheduled today for a port a catheter.   The patient has had a H&P performed within the last 30 days, all history, medications, and exam have been reviewed. The patient denies any interval changes since the H&P.  Medications: Prior to Admission medications   Medication Sig Start Date End Date Taking? Authorizing Provider  Calcium 200 MG TABS Take 200 mg by mouth daily.    Yes Historical Provider, MD  cholecalciferol (VITAMIN D) 1000 UNITS tablet Take 1,000 Units by mouth daily.    Yes Historical Provider, MD  citalopram (CELEXA) 40 MG tablet TAKE 1 TABLET BY MOUTH DAILY. 09/30/14  Yes Nicholas Lose, MD  gabapentin (NEURONTIN) 300 MG capsule Take 1 capsule (300 mg total) by mouth daily. 02/10/14  Yes Consuela Mimes, MD  ibuprofen (ADVIL,MOTRIN) 200 MG tablet Take 600 mg by mouth every 8 (eight) hours as needed for moderate pain (pain).    Yes Historical Provider, MD  naproxen sodium (ANAPROX) 220 MG tablet Take 220-440 mg by mouth 2 (two) times daily as needed (for muscle aches.).    Yes Historical Provider, MD  omeprazole (PRILOSEC) 20 MG capsule Take 1 capsule (20 mg total) by mouth daily. 07/22/13  Yes Consuela Mimes, MD  promethazine (PHENERGAN) 25 MG tablet Take 1 tablet (25 mg total) by mouth every 6 (six) hours as needed for nausea. 06/01/15  Yes Nicholas Lose, MD  ranitidine (ZANTAC) 150 MG capsule Take 1 capsule (150 mg total) by mouth 2 (two) times daily. 06/04/15  Yes Edward Saguier, PA-C  scopolamine (TRANSDERM-SCOP, 1.5 MG,) 1 MG/3DAYS Place 1 patch (1.5 mg total) onto the skin every 3 (three) days. 06/16/15  Yes Nicholas Lose, MD  SUMAtriptan (IMITREX) 50 MG tablet Take 1 tablet (50 mg total) by mouth every 2 (two) hours as needed for migraine. May repeat in 2 hours if headache persists or recurs. 05/21/15  Yes Edward Saguier, PA-C  traMADol (ULTRAM) 50 MG tablet Take 1 tablet (50 mg total) by mouth 3 (three) times daily as  needed. Patient taking differently: Take 50 mg by mouth 3 (three) times daily as needed for moderate pain.  06/21/15  Yes Nicholas Lose, MD  UNABLE TO FIND Dispense compression sleeve 20-30 mm for this patient with history of breast cancer s/p surgery 09/14/14  Yes Nicholas Lose, MD  ALPRAZolam Duanne Moron) 0.5 MG tablet Take 1 tablet (0.5 mg total) by mouth at bedtime as needed for sleep. 12/10/13   Consuela Mimes, MD  anastrozole (ARIMIDEX) 1 MG tablet Take 1 tablet (1 mg total) by mouth daily. Patient not taking: Reported on 06/24/2015 07/22/13   Consuela Mimes, MD  fluocinonide cream (LIDEX) 2.45 % Apply 1 application topically 2 (two) times daily as needed (for rash.).    Historical Provider, MD  lidocaine-prilocaine (EMLA) cream Apply to affected area once 06/24/15   Nicholas Lose, MD  LORazepam (ATIVAN) 0.5 MG tablet Take 1 tablet (0.5 mg total) by mouth at bedtime. 06/24/15   Nicholas Lose, MD  ondansetron (ZOFRAN) 4 MG tablet Take 1 tablet (4 mg total) by mouth every 8 (eight) hours as needed for nausea or vomiting. 05/18/15   Susanne Borders, NP  ondansetron (ZOFRAN) 8 MG tablet Take 1 tablet (8 mg total) by mouth 2 (two) times daily. Start the day after chemo for 2 days. Then take as needed for nausea or vomiting. 06/24/15   Nicholas Lose, MD  prochlorperazine (COMPAZINE) 10 MG tablet Take 1  tablet (10 mg total) by mouth every 6 (six) hours as needed (for nausea.). 06/11/15   Nicholas Lose, MD  prochlorperazine (COMPAZINE) 10 MG tablet Take 1 tablet (10 mg total) by mouth every 6 (six) hours as needed (Nausea or vomiting). 06/24/15   Nicholas Lose, MD    Vital Signs: T:98.46F HR: 112 bpm BP:127/90 mmHg O2: 95%RA  Physical Exam  Constitutional: She is oriented to person, place, and time. No distress.  HENT:  Head: Normocephalic and atraumatic.  Neck: No tracheal deviation present.  Cardiovascular: Regular rhythm.  Exam reveals no gallop and no friction rub.   No murmur heard. Tachycardic  Pulmonary/Chest: Breath  sounds normal. No respiratory distress. She has no wheezes. She has no rales.  Abdominal: Soft. Bowel sounds are normal. She exhibits no distension. There is no tenderness.  Neurological: She is alert and oriented to person, place, and time.  Skin: Skin is warm and dry. She is not diaphoretic.  Right chest wall previous port site scar    Mallampati Score:  MD Evaluation Airway: WNL Heart: WNL Abdomen: WNL Chest/ Lungs: WNL ASA  Classification: 3 Mallampati/Airway Score: Two  Labs:  CBC:  Recent Labs  05/18/15 0950 06/04/15 1131 06/04/15 2136 06/28/15 1355  WBC 6.4 5.3 6.8 5.1  HGB 14.2 15.0 14.5 14.0  HCT 41.8 43.1 42.3 41.0  PLT 227 237.0 214 209    COAGS:  Recent Labs  06/28/15 1355  INR 1.00    BMP:  Recent Labs  10/19/14 1255 05/18/15 0950 06/04/15 1131 06/04/15 2136  NA 138 141 140 138  K 4.1 4.0 3.5 3.9  CL 101  --  102 103  CO2 26 27 29 27   GLUCOSE 101* 113 96 127*  BUN 10 13.6 14 14   CALCIUM 9.5 9.7 10.1 9.6  CREATININE 0.61 0.8 0.76 0.90  GFRNONAA >90  --   --  >60  GFRAA >90  --   --  >60    LIVER FUNCTION TESTS:  Recent Labs  08/11/14 1438 10/19/14 1255 05/18/15 0950 06/04/15 1131  BILITOT 0.47 0.3 0.51 0.7  AST 13 17 17 18   ALT 21 26 22 26   ALKPHOS 66 65 72 60  PROT 7.0 7.5 7.2 7.3  ALBUMIN 3.9 4.2 4.0 4.5    Assessment/Plan:  Metastatic breast cancer Seen by Dr. Lindi Adie Scheduled today for image guided port a catheter placement with sedation The patient has been NPO, no blood thinners taken, labs and vitals have been reviewed. Risks and Benefits discussed with the patient including, but not limited to bleeding, infection, pneumothorax, or fibrin sheath development and need for additional procedures. All of the patient's questions were answered, patient is agreeable to proceed. Consent signed and in chart. OSA, uses CPAP   Signed: Hedy Jacob 06/28/2015, 3:13 PM

## 2015-06-28 NOTE — Procedures (Signed)
Interventional Radiology Procedure Note  Procedure: Placement of a right IJ approach single lumen PowerPort.  Tip is positioned at the superior cavoatrial junction and catheter is ready for immediate use.  Complications: No immediate Recommendations:  - Ok to shower tomorrow - Do not submerge for 7 days - Routine line care   Signed,  Joua Bake K. Kacie Huxtable, MD   

## 2015-06-28 NOTE — Discharge Instructions (Signed)

## 2015-06-29 ENCOUNTER — Ambulatory Visit (HOSPITAL_BASED_OUTPATIENT_CLINIC_OR_DEPARTMENT_OTHER): Payer: Medicare Other

## 2015-06-29 ENCOUNTER — Other Ambulatory Visit (HOSPITAL_BASED_OUTPATIENT_CLINIC_OR_DEPARTMENT_OTHER): Payer: Medicare Other

## 2015-06-29 VITALS — BP 138/87 | HR 108 | Temp 98.3°F | Resp 18

## 2015-06-29 DIAGNOSIS — C778 Secondary and unspecified malignant neoplasm of lymph nodes of multiple regions: Secondary | ICD-10-CM | POA: Diagnosis not present

## 2015-06-29 DIAGNOSIS — C50912 Malignant neoplasm of unspecified site of left female breast: Secondary | ICD-10-CM

## 2015-06-29 DIAGNOSIS — C7951 Secondary malignant neoplasm of bone: Secondary | ICD-10-CM

## 2015-06-29 DIAGNOSIS — C773 Secondary and unspecified malignant neoplasm of axilla and upper limb lymph nodes: Secondary | ICD-10-CM

## 2015-06-29 DIAGNOSIS — Z5111 Encounter for antineoplastic chemotherapy: Secondary | ICD-10-CM | POA: Diagnosis not present

## 2015-06-29 DIAGNOSIS — C50919 Malignant neoplasm of unspecified site of unspecified female breast: Secondary | ICD-10-CM

## 2015-06-29 LAB — COMPREHENSIVE METABOLIC PANEL (CC13)
ALBUMIN: 3.8 g/dL (ref 3.5–5.0)
ALK PHOS: 68 U/L (ref 40–150)
ALT: 32 U/L (ref 0–55)
AST: 20 U/L (ref 5–34)
Anion Gap: 8 mEq/L (ref 3–11)
BILIRUBIN TOTAL: 0.49 mg/dL (ref 0.20–1.20)
BUN: 8.4 mg/dL (ref 7.0–26.0)
CO2: 23 mEq/L (ref 22–29)
CREATININE: 0.7 mg/dL (ref 0.6–1.1)
Calcium: 9.3 mg/dL (ref 8.4–10.4)
Chloride: 108 mEq/L (ref 98–109)
GLUCOSE: 102 mg/dL (ref 70–140)
Potassium: 3.8 mEq/L (ref 3.5–5.1)
SODIUM: 140 meq/L (ref 136–145)
TOTAL PROTEIN: 6.7 g/dL (ref 6.4–8.3)

## 2015-06-29 LAB — CBC WITH DIFFERENTIAL/PLATELET
BASO%: 0.2 % (ref 0.0–2.0)
Basophils Absolute: 0 10*3/uL (ref 0.0–0.1)
EOS ABS: 0.1 10*3/uL (ref 0.0–0.5)
EOS%: 2.5 % (ref 0.0–7.0)
HCT: 40.2 % (ref 34.8–46.6)
HEMOGLOBIN: 14 g/dL (ref 11.6–15.9)
LYMPH%: 29.3 % (ref 14.0–49.7)
MCH: 30.4 pg (ref 25.1–34.0)
MCHC: 34.8 g/dL (ref 31.5–36.0)
MCV: 87.4 fL (ref 79.5–101.0)
MONO#: 0.4 10*3/uL (ref 0.1–0.9)
MONO%: 9.1 % (ref 0.0–14.0)
NEUT%: 58.9 % (ref 38.4–76.8)
NEUTROS ABS: 2.8 10*3/uL (ref 1.5–6.5)
PLATELETS: 189 10*3/uL (ref 145–400)
RBC: 4.6 10*6/uL (ref 3.70–5.45)
RDW: 13.4 % (ref 11.2–14.5)
WBC: 4.8 10*3/uL (ref 3.9–10.3)
lymph#: 1.4 10*3/uL (ref 0.9–3.3)

## 2015-06-29 MED ORDER — SODIUM CHLORIDE 0.9 % IV SOLN
Freq: Once | INTRAVENOUS | Status: AC
  Administered 2015-06-29: 10:00:00 via INTRAVENOUS
  Filled 2015-06-29: qty 4

## 2015-06-29 MED ORDER — SODIUM CHLORIDE 0.9 % IV SOLN
Freq: Once | INTRAVENOUS | Status: AC
  Administered 2015-06-29: 10:00:00 via INTRAVENOUS

## 2015-06-29 MED ORDER — SODIUM CHLORIDE 0.9 % IJ SOLN
10.0000 mL | INTRAMUSCULAR | Status: DC | PRN
  Administered 2015-06-29: 10 mL
  Filled 2015-06-29: qty 10

## 2015-06-29 MED ORDER — SODIUM CHLORIDE 0.9 % IV SOLN
1.4200 mg/m2 | Freq: Once | INTRAVENOUS | Status: AC
  Administered 2015-06-29: 3 mg via INTRAVENOUS
  Filled 2015-06-29: qty 6

## 2015-06-29 MED ORDER — HEPARIN SOD (PORK) LOCK FLUSH 100 UNIT/ML IV SOLN
500.0000 [IU] | Freq: Once | INTRAVENOUS | Status: AC | PRN
  Administered 2015-06-29: 500 [IU]
  Filled 2015-06-29: qty 5

## 2015-06-29 NOTE — Patient Instructions (Signed)
[facility] Discharge Instructions for Patients Receiving Chemotherapy  Today you received the following chemotherapy agents:  Halaven  To help prevent nausea and vomiting after your treatment, we encourage you to take your nausea medication as ordered per MD.   If you develop nausea and vomiting that is not controlled by your nausea medication, call the clinic.   BELOW ARE SYMPTOMS THAT SHOULD BE REPORTED IMMEDIATELY:  *FEVER GREATER THAN 100.5 F  *CHILLS WITH OR WITHOUT FEVER  NAUSEA AND VOMITING THAT IS NOT CONTROLLED WITH YOUR NAUSEA MEDICATION  *UNUSUAL SHORTNESS OF BREATH  *UNUSUAL BRUISING OR BLEEDING  TENDERNESS IN MOUTH AND THROAT WITH OR WITHOUT PRESENCE OF ULCERS  *URINARY PROBLEMS  *BOWEL PROBLEMS  UNUSUAL RASH Items with * indicate a potential emergency and should be followed up as soon as possible.  Feel free to call the clinic you have any questions or concerns. The clinic phone number is [phone].  Please show the [place] [facility] at check-in to the Emergency Department and triage nurse.

## 2015-06-30 ENCOUNTER — Telehealth: Payer: Self-pay | Admitting: *Deleted

## 2015-06-30 NOTE — Telephone Encounter (Signed)
1st Halaven yesterday. Called and left VMM, advised to call clinic and speak with any nurse.

## 2015-07-01 ENCOUNTER — Telehealth: Payer: Self-pay | Admitting: Hematology and Oncology

## 2015-07-01 NOTE — Telephone Encounter (Signed)
S/w pt confirming labs moved up due to chemo in the morning.... KJ pt will get an updated schedule at next visit

## 2015-07-02 ENCOUNTER — Telehealth: Payer: Self-pay | Admitting: Hematology and Oncology

## 2015-07-02 NOTE — Telephone Encounter (Signed)
Patient returning call from nurse regarding her chemotherapy.  She reported she had some nausea yesterday and took her medication as prescribe, stated it helped some but still had nausea.  She is feeling much better today.  Instructed patient to call if she continues to have any uncontrolled nausea.

## 2015-07-05 ENCOUNTER — Other Ambulatory Visit: Payer: Self-pay | Admitting: Medical

## 2015-07-05 NOTE — Telephone Encounter (Signed)
rx zantac. Sent to pt pharmacy.

## 2015-07-06 ENCOUNTER — Encounter: Payer: Self-pay | Admitting: Hematology and Oncology

## 2015-07-06 ENCOUNTER — Telehealth: Payer: Self-pay | Admitting: Hematology and Oncology

## 2015-07-06 ENCOUNTER — Ambulatory Visit (HOSPITAL_BASED_OUTPATIENT_CLINIC_OR_DEPARTMENT_OTHER): Payer: Medicare Other

## 2015-07-06 ENCOUNTER — Ambulatory Visit (HOSPITAL_BASED_OUTPATIENT_CLINIC_OR_DEPARTMENT_OTHER): Payer: Medicare Other | Admitting: Hematology and Oncology

## 2015-07-06 ENCOUNTER — Other Ambulatory Visit (HOSPITAL_BASED_OUTPATIENT_CLINIC_OR_DEPARTMENT_OTHER): Payer: Medicare Other

## 2015-07-06 VITALS — BP 119/85 | HR 110 | Temp 98.2°F | Resp 18 | Ht 62.0 in | Wt 221.2 lb

## 2015-07-06 DIAGNOSIS — C50919 Malignant neoplasm of unspecified site of unspecified female breast: Secondary | ICD-10-CM

## 2015-07-06 DIAGNOSIS — C50912 Malignant neoplasm of unspecified site of left female breast: Secondary | ICD-10-CM

## 2015-07-06 DIAGNOSIS — R110 Nausea: Secondary | ICD-10-CM | POA: Diagnosis not present

## 2015-07-06 DIAGNOSIS — R748 Abnormal levels of other serum enzymes: Secondary | ICD-10-CM

## 2015-07-06 DIAGNOSIS — R05 Cough: Secondary | ICD-10-CM

## 2015-07-06 DIAGNOSIS — C773 Secondary and unspecified malignant neoplasm of axilla and upper limb lymph nodes: Secondary | ICD-10-CM

## 2015-07-06 DIAGNOSIS — C7951 Secondary malignant neoplasm of bone: Secondary | ICD-10-CM | POA: Diagnosis not present

## 2015-07-06 DIAGNOSIS — C50911 Malignant neoplasm of unspecified site of right female breast: Secondary | ICD-10-CM

## 2015-07-06 LAB — CBC WITH DIFFERENTIAL/PLATELET
BASO%: 0.7 % (ref 0.0–2.0)
Basophils Absolute: 0 10e3/uL (ref 0.0–0.1)
EOS%: 0.8 % (ref 0.0–7.0)
Eosinophils Absolute: 0 10e3/uL (ref 0.0–0.5)
HCT: 41.8 % (ref 34.8–46.6)
HGB: 14.3 g/dL (ref 11.6–15.9)
LYMPH%: 32.3 % (ref 14.0–49.7)
MCH: 29.7 pg (ref 25.1–34.0)
MCHC: 34.1 g/dL (ref 31.5–36.0)
MCV: 87.1 fL (ref 79.5–101.0)
MONO#: 0.2 10e3/uL (ref 0.1–0.9)
MONO%: 4.2 % (ref 0.0–14.0)
NEUT#: 2.9 10e3/uL (ref 1.5–6.5)
NEUT%: 62 % (ref 38.4–76.8)
Platelets: 241 10e3/uL (ref 145–400)
RBC: 4.81 10e6/uL (ref 3.70–5.45)
RDW: 13.3 % (ref 11.2–14.5)
WBC: 4.6 10e3/uL (ref 3.9–10.3)
lymph#: 1.5 10e3/uL (ref 0.9–3.3)

## 2015-07-06 LAB — COMPREHENSIVE METABOLIC PANEL (CC13)
ALT: 62 U/L — AB (ref 0–55)
AST: 39 U/L — AB (ref 5–34)
Albumin: 4 g/dL (ref 3.5–5.0)
Alkaline Phosphatase: 60 U/L (ref 40–150)
Anion Gap: 11 mEq/L (ref 3–11)
BUN: 7.8 mg/dL (ref 7.0–26.0)
CHLORIDE: 107 meq/L (ref 98–109)
CO2: 27 meq/L (ref 22–29)
CREATININE: 0.9 mg/dL (ref 0.6–1.1)
Calcium: 9.6 mg/dL (ref 8.4–10.4)
EGFR: 76 mL/min/{1.73_m2} — ABNORMAL LOW (ref >90)
Glucose: 115 mg/dl (ref 70–140)
POTASSIUM: 3.9 meq/L (ref 3.5–5.1)
SODIUM: 145 meq/L (ref 136–145)
Total Bilirubin: 0.69 mg/dL (ref 0.20–1.20)
Total Protein: 6.8 g/dL (ref 6.4–8.3)

## 2015-07-06 MED ORDER — PEGFILGRASTIM 6 MG/0.6ML ~~LOC~~ PSKT
6.0000 mg | PREFILLED_SYRINGE | Freq: Once | SUBCUTANEOUS | Status: AC
  Administered 2015-07-06: 6 mg via SUBCUTANEOUS
  Filled 2015-07-06: qty 0.6

## 2015-07-06 MED ORDER — PALONOSETRON HCL INJECTION 0.25 MG/5ML
0.2500 mg | Freq: Once | INTRAVENOUS | Status: AC
  Administered 2015-07-06: 0.25 mg via INTRAVENOUS

## 2015-07-06 MED ORDER — SODIUM CHLORIDE 0.9 % IV SOLN
Freq: Once | INTRAVENOUS | Status: AC
  Administered 2015-07-06: 15:00:00 via INTRAVENOUS

## 2015-07-06 MED ORDER — HEPARIN SOD (PORK) LOCK FLUSH 100 UNIT/ML IV SOLN
500.0000 [IU] | Freq: Once | INTRAVENOUS | Status: AC | PRN
  Administered 2015-07-06: 500 [IU]
  Filled 2015-07-06: qty 5

## 2015-07-06 MED ORDER — SODIUM CHLORIDE 0.9 % IV SOLN
Freq: Once | INTRAVENOUS | Status: AC
  Administered 2015-07-06: 16:00:00 via INTRAVENOUS
  Filled 2015-07-06: qty 1

## 2015-07-06 MED ORDER — SODIUM CHLORIDE 0.9 % IJ SOLN
10.0000 mL | INTRAMUSCULAR | Status: DC | PRN
  Administered 2015-07-06: 10 mL
  Filled 2015-07-06: qty 10

## 2015-07-06 MED ORDER — PALONOSETRON HCL INJECTION 0.25 MG/5ML
INTRAVENOUS | Status: AC
  Filled 2015-07-06: qty 5

## 2015-07-06 MED ORDER — SODIUM CHLORIDE 0.9 % IV SOLN
1.4200 mg/m2 | Freq: Once | INTRAVENOUS | Status: AC
  Administered 2015-07-06: 3 mg via INTRAVENOUS
  Filled 2015-07-06: qty 6

## 2015-07-06 NOTE — Assessment & Plan Note (Signed)
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 BRCA 2 mutation underwent salpingo-oophorectomy Status post bilateral mastectomies and radiation to left chest wall and axilla currently on tamoxifen since October 2012) ------------------------------------------------------------------------------------------------------------------------------------------------------- Treatment plan: Halaven day 1 and day 8 every 3 weeks 6 cycles following which we will consider starting letrozole with Ibrance Current treatment:cycle 1 day 8 Halaven Halaven toxicities:   Return to clinic in 2 weeks for cycle 2

## 2015-07-06 NOTE — Telephone Encounter (Signed)
Appointments made and avs printed for patient °

## 2015-07-06 NOTE — Patient Instructions (Signed)
[facility] Discharge Instructions for Patients Receiving Chemotherapy  Today you received the following chemotherapy agents: Haloven.  To help prevent nausea and vomiting after your treatment, we encourage you to take your nausea medication: Compazine 10 mg every 6  Hours as needed; Zofran 8 mg every 12 hours as needed.   If you develop nausea and vomiting that is not controlled by your nausea medication, call the clinic.   BELOW ARE SYMPTOMS THAT SHOULD BE REPORTED IMMEDIATELY:  *FEVER GREATER THAN 100.5 F  *CHILLS WITH OR WITHOUT FEVER  NAUSEA AND VOMITING THAT IS NOT CONTROLLED WITH YOUR NAUSEA MEDICATION  *UNUSUAL SHORTNESS OF BREATH  *UNUSUAL BRUISING OR BLEEDING  TENDERNESS IN MOUTH AND THROAT WITH OR WITHOUT PRESENCE OF ULCERS  *URINARY PROBLEMS  *BOWEL PROBLEMS  UNUSUAL RASH Items with * indicate a potential emergency and should be followed up as soon as possible.  Feel free to call the clinic you have any questions or concerns. The clinic phone number is [phone].  Please show the [facility] at check-in to the Emergency Department and triage nurse.

## 2015-07-06 NOTE — Progress Notes (Signed)
Educated on ON Pro device. Pt watched video  Also. Agreeable to On Pro.

## 2015-07-06 NOTE — Progress Notes (Signed)
Patient Care Team: Mackie Pai, PA-C as PCP - General (Physician Assistant) Thea Silversmith, MD as Consulting Physician (Radiation Oncology) Consuela Mimes, MD as Consulting Physician (Hematology and Oncology) Nicholas Lose, MD as Consulting Physician (Hematology and Oncology)  DIAGNOSIS: Breast cancer metastasized to bone   Staging form: Breast, AJCC 7th Edition     Pathologic: Stage IIIC (T4d, N3, cM0) - Signed by [name], MD on 01/19/2014   SUMMARY OF ONCOLOGIC HISTORY:   Breast cancer metastasized to bone   12/07/2010 - 04/12/2011 Neo-Adjuvant Chemotherapy Neoadjuvant FEC x6 followed by Taxotere x4   12/26/2010 Procedure Genetic testing showed mutation for BRCA2 [id number]   05/12/2011 Surgery Bilateral mastectomy with left axillary dissection 11/21 positive lymph nodes 5.8 cm tumor ER 95% PR 0% Ki-67 61% HER-2 negative ratio 1.39 T3, N3, 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   07/03/2011 - 08/17/2011 Radiation Therapy Radiation therapy to the chest and axilla   09/11/2011 -  Anti-estrogen oral therapy Tamoxifen later switched to Arimidex 07/2012   04/22/2012 Surgery Bilateral salpingo-oophorectomy   06/10/2015 Imaging Bone scan: uptake right lateral frontal bone, left malar region, manubrium, right 6,8 ribs; CT-CAP: New extensive infiltrate left & right hilar/mediastinal, right axillary, bil lower neck LN, 3.7 cm LUL consolidation, RML nodules,Lt Pl eff, RPLN   06/18/2015 Initial Biopsy Right axillary lymph node biopsy: 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 Today is cycle 1 day 8. She had tolerated first week of treatment extremely well except for nausea and dry heaves that lasted for 1-2 days. She has noticed improvement in her  wife as well as with breathing. She complains of cough without expectoration as well as constipation issues. Her low back is continuing to cause pain and tramadol is not very effective.  REVIEW OF SYSTEMS:   Constitutional: Denies fevers, chills or abnormal weight loss Eyes: Denies blurriness of vision Ears, nose, mouth, throat, and face: Denies mucositis or sore throat Respiratory: cough without expectoration or fevers Cardiovascular: Denies palpitation, chest discomfort or lower extremity swelling Gastrointestinal:  Nausea due to chemotherapy Skin: Denies abnormal skin rashes Lymphatics: Denies new lymphadenopathy or easy bruising Neurological:Denies numbness, tingling or new weaknesses Behavioral/Psych: Mood is stable, no new changes  Breast:  denies any pain or lumps or nodules in either breasts All other systems were reviewed with the patient and are negative.  I have reviewed the past medical history, past surgical history, social history and family history with the patient and they are unchanged from previous note.  ALLERGIES:  is allergic to acyclovir and related and doxycycline hydrochloride.  MEDICATIONS:  Current Outpatient Prescriptions  Medication Sig Dispense Refill  . ALPRAZolam (XANAX) 0.5 MG tablet Take 1 tablet (0.5 mg total) by mouth at bedtime as needed for sleep. 30 tablet 0  . anastrozole (ARIMIDEX) 1 MG tablet Take 1 tablet (1 mg total) by mouth daily. 90 tablet 6  . Calcium 200 MG TABS Take 200 mg by mouth daily.     . cholecalciferol (VITAMIN D) 1000 UNITS tablet Take 1,000 Units by mouth daily.     . citalopram (CELEXA) 40 MG tablet TAKE 1 TABLET BY MOUTH DAILY. 30 tablet 5  . fluocinonide cream (LIDEX) 4.00 % Apply 1 application topically 2 (two) times daily as  needed (for rash.).    Marland Kitchen gabapentin (NEURONTIN) 300 MG capsule Take 1 capsule (300 mg total) by mouth daily. 30 capsule 5  . ibuprofen (ADVIL,MOTRIN) 200 MG tablet Take 600 mg by mouth every 8 (eight)  hours as needed for moderate pain (pain).     Marland Kitchen lidocaine-prilocaine (EMLA) cream Apply to affected area once 30 g 3  . LORazepam (ATIVAN) 0.5 MG tablet Take 1 tablet (0.5 mg total) by mouth at bedtime. 30 tablet 0  . naproxen sodium (ANAPROX) 220 MG tablet Take 220-440 mg by mouth 2 (two) times daily as needed (for muscle aches.).     Marland Kitchen omeprazole (PRILOSEC) 20 MG capsule Take 1 capsule (20 mg total) by mouth daily. 90 capsule 6  . ondansetron (ZOFRAN) 4 MG tablet Take 1 tablet (4 mg total) by mouth every 8 (eight) hours as needed for nausea or vomiting. 30 tablet 2  . ondansetron (ZOFRAN) 8 MG tablet Take 1 tablet (8 mg total) by mouth 2 (two) times daily. Start the day after chemo for 2 days. Then take as needed for nausea or vomiting. 30 tablet 1  . prochlorperazine (COMPAZINE) 10 MG tablet Take 1 tablet (10 mg total) by mouth every 6 (six) hours as needed (Nausea or vomiting). 30 tablet 1  . promethazine (PHENERGAN) 25 MG tablet Take 1 tablet (25 mg total) by mouth every 6 (six) hours as needed for nausea. 30 tablet 1  . ranitidine (ZANTAC) 150 MG tablet TAKE 1 TABLET BY MOUTH TWO TIMES DAILY. 60 tablet 2  . scopolamine (TRANSDERM-SCOP, 1.5 MG,) 1 MG/3DAYS Place 1 patch (1.5 mg total) onto the skin every 3 (three) days. 10 patch 12  . SUMAtriptan (IMITREX) 50 MG tablet Take 1 tablet (50 mg total) by mouth every 2 (two) hours as needed for migraine. May repeat in 2 hours if headache persists or recurs. 10 tablet 0  . traMADol (ULTRAM) 50 MG tablet Take 1 tablet (50 mg total) by mouth 3 (three) times daily as needed. (Patient taking differently: Take 50 mg by mouth 3 (three) times daily as needed for moderate pain. ) 30 tablet 0  . UNABLE TO FIND Dispense compression sleeve 20-30 mm for this patient with history of breast cancer s/p surgery 1 each 0   No current facility-administered medications for this visit.    PHYSICAL EXAMINATION: ECOG PERFORMANCE STATUS: 1 - Symptomatic but completely  ambulatory  Filed Vitals:   07/06/15 1409  BP: 119/85  Pulse: 110  Temp: 98.2 F (36.8 C)  Resp: 18   Filed Weights   07/06/15 1409  Weight: 221 lb 3.2 oz (100.336 kg)    GENERAL:alert, no distress and comfortable SKIN: skin color, texture, turgor are normal, no rashes or significant lesions EYES: normal, Conjunctiva are pink and non-injected, sclera clear OROPHARYNX:no exudate, no erythema and lips, buccal mucosa, and tongue normal  NECK: supple, thyroid normal size, non-tender, without nodularity LYMPH:  no palpable lymphadenopathy in the cervical, axillary or inguinal LUNGS: clear to auscultation and percussion with normal breathing effort HEART: regular rate & rhythm and no murmurs and no lower extremity edema ABDOMEN:abdomen soft, non-tender and normal bowel sounds Musculoskeletal:no cyanosis of digits and no clubbing  NEURO: alert & oriented x 3 with fluent speech, no focal motor/sensory deficits  LABORATORY DATA:  I have reviewed the data as listed   Chemistry      Component Value Date/Time   NA 145 07/06/2015 1339   NA 138 06/04/2015 2136   K 3.9  07/06/2015 1339   K 3.9 06/04/2015 2136   CL 103 06/04/2015 2136   CL 108* 07/30/2012 1430   CO2 27 07/06/2015 1339   CO2 27 06/04/2015 2136   BUN 7.8 07/06/2015 1339   BUN 14 06/04/2015 2136   CREATININE 0.9 07/06/2015 1339   CREATININE 0.90 06/04/2015 2136      Component Value Date/Time   CALCIUM 9.6 07/06/2015 1339   CALCIUM 9.6 06/04/2015 2136   ALKPHOS 60 07/06/2015 1339   ALKPHOS 60 06/04/2015 1131   AST 39* 07/06/2015 1339   AST 18 06/04/2015 1131   ALT 62* 07/06/2015 1339   ALT 26 06/04/2015 1131   BILITOT 0.69 07/06/2015 1339   BILITOT 0.7 06/04/2015 1131       Lab Results  Component Value Date   WBC 4.6 07/06/2015   HGB 14.3 07/06/2015   HCT 41.8 07/06/2015   MCV 87.1 07/06/2015   PLT 241 07/06/2015   NEUTROABS 2.9 07/06/2015   ASSESSMENT & PLAN:  Breast cancer metastasized to  bone Right axillary lymph node biopsy 06/18/2015: 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 BRCA 2 mutation underwent salpingo-oophorectomy Status post bilateral mastectomies and radiation to left chest wall and axilla currently on tamoxifen since October 2012) ------------------------------------------------------------------------------------------------------------------------------------------------------- Treatment plan: Halaven day 1 and day 8 every 3 weeks 6 cycles following which we will consider starting letrozole with Ibrance Current treatment:cycle 1 day 8 Halaven Halaven toxicities: 1. Nausea and dry heaves: we will change antiemetics regimen to Alox[place] 2. Cough without expectoration 3. Elevation of AST and ALT: We will continue to watch and monitor this.  Monitoring closely for chemotherapy toxicities Blood counts were reviewed Patient has noted improvement in hoarseness of voice as well as breathing even with 1 dose of Halaven.  Return to clinic in 2 weeks for cycle 2   No orders of the defined types were placed in this encounter.   The patient has a good understanding of the overall plan. she agrees with it. she will call with any problems that may develop before the next visit here.   Rulon Eisenmenger, MD

## 2015-07-13 ENCOUNTER — Telehealth: Payer: Self-pay | Admitting: *Deleted

## 2015-07-13 ENCOUNTER — Other Ambulatory Visit: Payer: Self-pay | Admitting: *Deleted

## 2015-07-13 ENCOUNTER — Encounter: Payer: Self-pay | Admitting: Hematology and Oncology

## 2015-07-13 ENCOUNTER — Ambulatory Visit (HOSPITAL_BASED_OUTPATIENT_CLINIC_OR_DEPARTMENT_OTHER): Payer: Medicare Other | Admitting: Hematology and Oncology

## 2015-07-13 ENCOUNTER — Ambulatory Visit (HOSPITAL_BASED_OUTPATIENT_CLINIC_OR_DEPARTMENT_OTHER): Payer: Medicare Other

## 2015-07-13 VITALS — BP 117/80 | HR 113 | Temp 98.0°F | Resp 18 | Ht 62.0 in | Wt 218.3 lb

## 2015-07-13 VITALS — BP 125/75 | HR 110

## 2015-07-13 DIAGNOSIS — E860 Dehydration: Secondary | ICD-10-CM

## 2015-07-13 DIAGNOSIS — C773 Secondary and unspecified malignant neoplasm of axilla and upper limb lymph nodes: Secondary | ICD-10-CM | POA: Diagnosis not present

## 2015-07-13 DIAGNOSIS — C7951 Secondary malignant neoplasm of bone: Secondary | ICD-10-CM

## 2015-07-13 DIAGNOSIS — C50912 Malignant neoplasm of unspecified site of left female breast: Secondary | ICD-10-CM | POA: Diagnosis not present

## 2015-07-13 DIAGNOSIS — R05 Cough: Secondary | ICD-10-CM | POA: Diagnosis not present

## 2015-07-13 DIAGNOSIS — R112 Nausea with vomiting, unspecified: Secondary | ICD-10-CM

## 2015-07-13 DIAGNOSIS — R748 Abnormal levels of other serum enzymes: Secondary | ICD-10-CM

## 2015-07-13 DIAGNOSIS — R1115 Cyclical vomiting syndrome unrelated to migraine: Secondary | ICD-10-CM

## 2015-07-13 DIAGNOSIS — T451X5A Adverse effect of antineoplastic and immunosuppressive drugs, initial encounter: Secondary | ICD-10-CM | POA: Insufficient documentation

## 2015-07-13 DIAGNOSIS — C50911 Malignant neoplasm of unspecified site of right female breast: Secondary | ICD-10-CM

## 2015-07-13 MED ORDER — HEPARIN SOD (PORK) LOCK FLUSH 100 UNIT/ML IV SOLN
500.0000 [IU] | Freq: Once | INTRAVENOUS | Status: AC
  Administered 2015-07-13: 500 [IU] via INTRAVENOUS
  Filled 2015-07-13: qty 5

## 2015-07-13 MED ORDER — SODIUM CHLORIDE 0.9 % IV SOLN
8.0000 mg | Freq: Once | INTRAVENOUS | Status: AC
  Administered 2015-07-13: 8 mg via INTRAVENOUS
  Filled 2015-07-13: qty 0.8

## 2015-07-13 MED ORDER — SODIUM CHLORIDE 0.9 % IJ SOLN
10.0000 mL | INTRAMUSCULAR | Status: DC | PRN
  Administered 2015-07-13: 10 mL via INTRAVENOUS
  Filled 2015-07-13: qty 10

## 2015-07-13 MED ORDER — PALONOSETRON HCL INJECTION 0.25 MG/5ML
INTRAVENOUS | Status: AC
  Filled 2015-07-13: qty 5

## 2015-07-13 MED ORDER — SODIUM CHLORIDE 0.9 % IV SOLN
Freq: Once | INTRAVENOUS | Status: AC
  Administered 2015-07-13: 15:00:00 via INTRAVENOUS

## 2015-07-13 MED ORDER — PALONOSETRON HCL INJECTION 0.25 MG/5ML
0.2500 mg | Freq: Once | INTRAVENOUS | Status: AC
  Administered 2015-07-13: 0.25 mg via INTRAVENOUS

## 2015-07-13 MED ORDER — DEXAMETHASONE 4 MG PO TABS: 2.0000 mg | ORAL_TABLET | Freq: Every day | ORAL | Status: DC

## 2015-07-13 NOTE — Patient Instructions (Signed)
Dehydration, Adult Dehydration is when you lose more fluids from the body than you take in. Vital organs like the kidneys, brain, and heart cannot function without a proper amount of fluids and salt. Any loss of fluids from the body can cause dehydration.  CAUSES   Vomiting.  Diarrhea.  Excessive sweating.  Excessive urine output.  Fever. SYMPTOMS  Mild dehydration  Thirst.  Dry lips.  Slightly dry mouth. Moderate dehydration  Very dry mouth.  Sunken eyes.  Skin does not bounce back quickly when lightly pinched and released.  Dark urine and decreased urine production.  Decreased tear production.  Headache. Severe dehydration  Very dry mouth.  Extreme thirst.  Rapid, weak pulse (more than 100 beats per minute at rest).  Cold hands and feet.  Not able to sweat in spite of heat and temperature.  Rapid breathing.  Blue lips.  Confusion and lethargy.  Difficulty being awakened.  Minimal urine production.  No tears. DIAGNOSIS  Your caregiver will diagnose dehydration based on your symptoms and your exam. Blood and urine tests will help confirm the diagnosis. The diagnostic evaluation should also identify the cause of dehydration. TREATMENT  Treatment of mild or moderate dehydration can often be done at home by increasing the amount of fluids that you drink. It is best to drink small amounts of fluid more often. Drinking too much at one time can make vomiting worse. Refer to the home care instructions below. Severe dehydration needs to be treated at the hospital where you will probably be given intravenous (IV) fluids that contain water and electrolytes. HOME CARE INSTRUCTIONS   Ask your caregiver about specific rehydration instructions.  Drink enough fluids to keep your urine clear or pale yellow.  Drink small amounts frequently if you have nausea and vomiting.  Eat as you normally do.  Avoid:  Foods or drinks high in sugar.  Carbonated  drinks.  Juice.  Extremely hot or cold fluids.  Drinks with caffeine.  Fatty, greasy foods.  Alcohol.  Tobacco.  Overeating.  Gelatin desserts.  Wash your hands well to avoid spreading bacteria and viruses.  Only take over-the-counter or prescription medicines for pain, discomfort, or fever as directed by your caregiver.  Ask your caregiver if you should continue all prescribed and over-the-counter medicines.  Keep all follow-up appointments with your caregiver. SEEK MEDICAL CARE IF:  You have abdominal pain and it increases or stays in one area (localizes).  You have a rash, stiff neck, or severe headache.  You are irritable, sleepy, or difficult to awaken.  You are weak, dizzy, or extremely thirsty. SEEK IMMEDIATE MEDICAL CARE IF:   You are unable to keep fluids down or you get worse despite treatment.  You have frequent episodes of vomiting or diarrhea.  You have blood or green matter (bile) in your vomit.  You have blood in your stool or your stool looks black and tarry.  You have not urinated in 6 to 8 hours, or you have only urinated a small amount of very dark urine.  You have a fever.  You faint. MAKE SURE YOU:   Understand these instructions.  Will watch your condition.  Will get help right away if you are not doing well or get worse. Document Released: 10/30/2005 Document Revised: 01/22/2012 Document Reviewed: 06/19/2011 Exit[employer] Patient Information 2015 [employer]. This information is not intended to replace advice given to you by your health care provider. Make sure you discuss any questions you have with your health care   provider.  

## 2015-07-13 NOTE — Assessment & Plan Note (Signed)
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 BRCA 2 mutation underwent salpingo-oophorectomy Status post bilateral mastectomies and radiation to left chest wall and axilla currently on tamoxifen since October 2012) ------------------------------------------------------------------------------------------------------------------------------------------------------- Treatment plan: Halaven day 1 and day 8 every 3 weeks 6 cycles following which we will consider starting letrozole with Ibrance Current treatment:cycle 1 day 15 Halaven Halaven toxicities: 1. Nausea and dry heaves: In spite of changing antiemetics regimen to Aloxi, patient continues to have persistent nausea and vomiting. We will add Emend. I've also given prescription for oral dexamethasone to be taken daily. 2. Cough without expectoration 3. Elevation of AST and ALT: We will continue to watch and monitor this. 4. We will consult palliative care. Patient wanted to talk about hospice care options in [place]. I encouraged her to also discuss the same with the palliative care folks. At this time she is planning to continue with chemotherapy but if she continues to have this much nausea and vomiting she might stop treatment.

## 2015-07-13 NOTE — Telephone Encounter (Signed)
PT IS ROTATING COMPAZINE, ZOFRAN, AND PHENERGAN EVERY TWO HOURS BUT IS STILL NAUSEATED FIVE DAYS A WEEKS. HER FLUID INTAKE SINCE CHEMOTHERAPY ON 07/06/15 HAS BEEN ABOUT 24 OUNCES IN A 24 HOUR PERIOD. SHE IS NOT EATING. VERBAL ORDER AND READ BACK TO DR.GUDENA- HE WILL SEE PT. TODAY AND ORDER FLUIDS. DR.GUDENA'S NURSE, DIANE BURLESON,RN WILL CONTACT PT. AND THE INFUSION ROOM.

## 2015-07-13 NOTE — Progress Notes (Signed)
Patient Care Team: Mackie Pai, PA-C as PCP - General (Physician Assistant) Thea Silversmith, MD as Consulting Physician (Radiation Oncology) Consuela Mimes, MD as Consulting Physician (Hematology and Oncology) Nicholas Lose, MD as Consulting Physician (Hematology and Oncology)  DIAGNOSIS: Breast cancer metastasized to bone   Staging form: Breast, AJCC 7th Edition     Pathologic: Stage IIIC (T4d, N3, cM0) - Signed by [name], MD on 01/19/2014   SUMMARY OF ONCOLOGIC HISTORY:   Breast cancer metastasized to bone   12/07/2010 - 04/12/2011 Neo-Adjuvant Chemotherapy Neoadjuvant FEC x6 followed by Taxotere x4   12/26/2010 Procedure Genetic testing showed mutation for BRCA2 [id number]   05/12/2011 Surgery Bilateral mastectomy with left axillary dissection 11/21 positive lymph nodes 5.8 cm tumor ER 95% PR 0% Ki-67 61% HER-2 negative ratio 1.39 T3, N3, 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   07/03/2011 - 08/17/2011 Radiation Therapy Radiation therapy to the chest and axilla   09/11/2011 -  Anti-estrogen oral therapy Tamoxifen later switched to Arimidex 07/2012   04/22/2012 Surgery Bilateral salpingo-oophorectomy   06/10/2015 Imaging Bone scan: uptake right lateral frontal bone, left malar region, manubrium, right 6,8 ribs; CT-CAP: New extensive infiltrate left & right hilar/mediastinal, right axillary, bil lower neck LN, 3.7 cm LUL consolidation, RML nodules,Lt Pl eff, RPLN   06/18/2015 Initial Biopsy Right axillary lymph node biopsy: 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 She came in for an urgent visit today because she was experiencing  intractable nausea and vomiting in spite of taking multiple nausea medications including Zofran, Compazine and Phenergan along with Ativan. She was describing traced to the ear her life continues to be this miserable she does not want to continue with any treatment. She is very emotional and tearful today. He wanted to find out more information about hospice and palliative care services available in [place].  REVIEW OF SYSTEMS:   Constitutional: Denies fevers, chills or abnormal weight loss Eyes: Denies blurriness of vision Ears, nose, mouth, throat, and face: Denies mucositis or sore throat Respiratory: Shortness of breath has improved Cardiovascular: Denies palpitation, chest discomfort or lower extremity swelling Gastrointestinal:  Nausea and vomiting, decreased appetite Skin: Denies abnormal skin rashes Lymphatics: Denies new lymphadenopathy or easy bruising Neurological:Denies numbness, tingling or new weaknesses Behavioral/Psych: Mood is stable, no new changes   All other systems were reviewed with the patient and are negative.  I have reviewed the past medical history, past surgical history, social history and family history with the patient and they are unchanged from previous note.  ALLERGIES:  is allergic to acyclovir and related and doxycycline hydrochloride.  MEDICATIONS:  Current Outpatient Prescriptions  Medication Sig Dispense Refill  . ALPRAZolam (XANAX) 0.5 MG tablet Take 1 tablet (0.5 mg total) by mouth at bedtime as needed for sleep. 30 tablet 0  . anastrozole (ARIMIDEX) 1 MG tablet Take 1 tablet (1 mg total) by mouth daily. 90 tablet 6  . Calcium 200 MG TABS Take 200 mg by mouth daily.     . cholecalciferol (VITAMIN D) 1000 UNITS tablet Take 1,000 Units by mouth daily.     . citalopram (CELEXA) 40 MG tablet TAKE 1  TABLET BY MOUTH DAILY. 30 tablet 5  . fluocinonide cream (LIDEX) 7.94 % Apply 1 application topically 2 (two) times daily as needed (for rash.).    Marland Kitchen  gabapentin (NEURONTIN) 300 MG capsule Take 1 capsule (300 mg total) by mouth daily. 30 capsule 5  . ibuprofen (ADVIL,MOTRIN) 200 MG tablet Take 600 mg by mouth every 8 (eight) hours as needed for moderate pain (pain).     Marland Kitchen lidocaine-prilocaine (EMLA) cream Apply to affected area once 30 g 3  . LORazepam (ATIVAN) 0.5 MG tablet Take 1 tablet (0.5 mg total) by mouth at bedtime. 30 tablet 0  . naproxen sodium (ANAPROX) 220 MG tablet Take 220-440 mg by mouth 2 (two) times daily as needed (for muscle aches.).     Marland Kitchen omeprazole (PRILOSEC) 20 MG capsule Take 1 capsule (20 mg total) by mouth daily. 90 capsule 6  . ondansetron (ZOFRAN) 4 MG tablet Take 1 tablet (4 mg total) by mouth every 8 (eight) hours as needed for nausea or vomiting. 30 tablet 2  . ondansetron (ZOFRAN) 8 MG tablet Take 1 tablet (8 mg total) by mouth 2 (two) times daily. Start the day after chemo for 2 days. Then take as needed for nausea or vomiting. 30 tablet 1  . prochlorperazine (COMPAZINE) 10 MG tablet Take 1 tablet (10 mg total) by mouth every 6 (six) hours as needed (Nausea or vomiting). 30 tablet 1  . promethazine (PHENERGAN) 25 MG tablet Take 1 tablet (25 mg total) by mouth every 6 (six) hours as needed for nausea. 30 tablet 1  . ranitidine (ZANTAC) 150 MG tablet TAKE 1 TABLET BY MOUTH TWO TIMES DAILY. 60 tablet 2  . scopolamine (TRANSDERM-SCOP, 1.5 MG,) 1 MG/3DAYS Place 1 patch (1.5 mg total) onto the skin every 3 (three) days. 10 patch 12  . SUMAtriptan (IMITREX) 50 MG tablet Take 1 tablet (50 mg total) by mouth every 2 (two) hours as needed for migraine. May repeat in 2 hours if headache persists or recurs. 10 tablet 0  . dexamethasone (DECADRON) 4 MG tablet Take 0.5 tablets (2 mg total) by mouth daily. 30 tablet 1  . traMADol (ULTRAM) 50 MG tablet Take 1 tablet (50 mg total) by mouth 3 (three) times daily as needed. (Patient taking differently: Take 50 mg by mouth 3 (three) times daily as needed for moderate pain. ) 30 tablet 0   . UNABLE TO FIND Dispense compression sleeve 20-30 mm for this patient with history of breast cancer s/p surgery 1 each 0   No current facility-administered medications for this visit.   Facility-Administered Medications Ordered in Other Visits  Medication Dose Route Frequency Provider Last Rate Last Dose  . dexamethasone (DECADRON) 8 mg in sodium chloride 0.9 % 50 mL IVPB  8 mg Intravenous Once Nicholas Lose, MD        PHYSICAL EXAMINATION: ECOG PERFORMANCE STATUS: 1 - Symptomatic but completely ambulatory  Filed Vitals:   07/13/15 1324  BP: 117/80  Pulse: 113  Temp: 98 F (36.7 C)  Resp: 18   Filed Weights   07/13/15 1324  Weight: 218 lb 4.8 oz (99.02 kg)    GENERAL:alert, no distress and comfortable SKIN: skin color, texture, turgor are normal, no rashes or significant lesions EYES: normal, Conjunctiva are pink and non-injected, sclera clear OROPHARYNX:no exudate, no erythema and lips, buccal mucosa, and tongue normal  NECK: supple, thyroid normal size, non-tender, without nodularity LYMPH:  no palpable lymphadenopathy in the cervical, axillary or inguinal LUNGS: clear to  auscultation and percussion with normal breathing effort HEART: regular rate & rhythm and no murmurs and no lower extremity edema ABDOMEN:abdomen soft, non-tender and normal bowel sounds Musculoskeletal:no cyanosis of digits and no clubbing  NEURO: alert & oriented x 3 with fluent speech, no focal motor/sensory deficits   LABORATORY DATA:  I have reviewed the data as listed   Chemistry      Component Value Date/Time   NA 145 07/06/2015 1339   NA 138 06/04/2015 2136   K 3.9 07/06/2015 1339   K 3.9 06/04/2015 2136   CL 103 06/04/2015 2136   CL 108* 07/30/2012 1430   CO2 27 07/06/2015 1339   CO2 27 06/04/2015 2136   BUN 7.8 07/06/2015 1339   BUN 14 06/04/2015 2136   CREATININE 0.9 07/06/2015 1339   CREATININE 0.90 06/04/2015 2136      Component Value Date/Time   CALCIUM 9.6 07/06/2015 1339    CALCIUM 9.6 06/04/2015 2136   ALKPHOS 60 07/06/2015 1339   ALKPHOS 60 06/04/2015 1131   AST 39* 07/06/2015 1339   AST 18 06/04/2015 1131   ALT 62* 07/06/2015 1339   ALT 26 06/04/2015 1131   BILITOT 0.69 07/06/2015 1339   BILITOT 0.7 06/04/2015 1131       Lab Results  Component Value Date   WBC 4.6 07/06/2015   HGB 14.3 07/06/2015   HCT 41.8 07/06/2015   MCV 87.1 07/06/2015   PLT 241 07/06/2015   NEUTROABS 2.9 07/06/2015   ASSESSMENT & PLAN:  Chemotherapy induced nausea and vomiting Patient is in tears describing how miserable she feels all day with persistent nausea and vomiting. She has been taking Zofran and Compazine and Phenergan but nothing appears to be helping. She also takes Ativan at bedtime. Plan: 1. Add Emend to next chemotherapy 2. Oral dexamethasone 2 mg every day Patient discussed with me about the pros and cons of marijuana and decreased nausea vomiting. I discussed with her that at best the benefits of mild to moderate.  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 BRCA 2 mutation underwent salpingo-oophorectomy Status post bilateral mastectomies and radiation to left chest wall and axilla currently on tamoxifen since October 2012) ------------------------------------------------------------------------------------------------------------------------------------------------------- Treatment plan: Halaven day 1 and day 8 every 3  weeks 6 cycles following which we will consider starting letrozole with Ibrance Current treatment:cycle 1 day 15 Halaven Halaven toxicities: 1. Nausea and dry heaves: In spite of changing antiemetics regimen to Aloxi, patient continues to have persistent nausea and vomiting. We will add Emend. I've also given prescription for oral dexamethasone to be taken daily. 2. Cough without expectoration 3. Elevation of AST and ALT: We will continue to watch and monitor this. 4. We will consult palliative care. Patient wanted to talk about hospice care options in [place]. I encouraged her to also discuss the same with the palliative care folks. At this time she is planning to continue with chemotherapy but if she continues to have this much nausea and vomiting she might stop  treatment.    Orders Placed This Encounter  Procedures  . Amb Referral to Palliative Care    Referral Priority:  Routine    Referral Type:  Consultation    Number of Visits Requested:  1   The patient has a good understanding of the overall plan. she agrees with it. she will call with any problems that may develop before the next visit here.   Rulon Eisenmenger, MD

## 2015-07-13 NOTE — Assessment & Plan Note (Signed)
Patient is in tears describing how miserable she feels all day with persistent nausea and vomiting. She has been taking Zofran and Compazine and Phenergan but nothing appears to be helping. She also takes Ativan at bedtime. Plan: 1. Add Emend to next chemotherapy 2. Oral dexamethasone 2 mg every day Patient discussed with me about the pros and cons of marijuana and decreased nausea vomiting. I discussed with her that at best the benefits of mild to moderate.

## 2015-07-20 ENCOUNTER — Ambulatory Visit (HOSPITAL_BASED_OUTPATIENT_CLINIC_OR_DEPARTMENT_OTHER): Payer: Medicare Other | Admitting: Hematology and Oncology

## 2015-07-20 ENCOUNTER — Encounter: Payer: Self-pay | Admitting: Hematology and Oncology

## 2015-07-20 ENCOUNTER — Other Ambulatory Visit: Payer: Medicare Other

## 2015-07-20 ENCOUNTER — Encounter: Payer: Medicare Other | Admitting: Gastroenterology

## 2015-07-20 ENCOUNTER — Ambulatory Visit (HOSPITAL_BASED_OUTPATIENT_CLINIC_OR_DEPARTMENT_OTHER): Payer: Medicare Other

## 2015-07-20 ENCOUNTER — Telehealth: Payer: Self-pay | Admitting: Hematology and Oncology

## 2015-07-20 ENCOUNTER — Other Ambulatory Visit (HOSPITAL_BASED_OUTPATIENT_CLINIC_OR_DEPARTMENT_OTHER): Payer: Medicare Other

## 2015-07-20 VITALS — BP 116/78 | HR 95 | Temp 98.2°F | Resp 18 | Ht 62.0 in | Wt 217.2 lb

## 2015-07-20 DIAGNOSIS — C773 Secondary and unspecified malignant neoplasm of axilla and upper limb lymph nodes: Secondary | ICD-10-CM | POA: Diagnosis not present

## 2015-07-20 DIAGNOSIS — C7951 Secondary malignant neoplasm of bone: Secondary | ICD-10-CM

## 2015-07-20 DIAGNOSIS — R740 Nonspecific elevation of levels of transaminase and lactic acid dehydrogenase [LDH]: Secondary | ICD-10-CM

## 2015-07-20 DIAGNOSIS — C50911 Malignant neoplasm of unspecified site of right female breast: Secondary | ICD-10-CM

## 2015-07-20 DIAGNOSIS — R112 Nausea with vomiting, unspecified: Secondary | ICD-10-CM

## 2015-07-20 DIAGNOSIS — C50912 Malignant neoplasm of unspecified site of left female breast: Secondary | ICD-10-CM | POA: Diagnosis not present

## 2015-07-20 DIAGNOSIS — D696 Thrombocytopenia, unspecified: Secondary | ICD-10-CM

## 2015-07-20 DIAGNOSIS — R05 Cough: Secondary | ICD-10-CM | POA: Diagnosis not present

## 2015-07-20 DIAGNOSIS — C50919 Malignant neoplasm of unspecified site of unspecified female breast: Secondary | ICD-10-CM

## 2015-07-20 LAB — CBC WITH DIFFERENTIAL/PLATELET
BASO%: 0.1 % (ref 0.0–2.0)
Basophils Absolute: 0 10*3/uL (ref 0.0–0.1)
EOS ABS: 0 10*3/uL (ref 0.0–0.5)
EOS%: 0 % (ref 0.0–7.0)
HCT: 38.2 % (ref 34.8–46.6)
HGB: 13.1 g/dL (ref 11.6–15.9)
LYMPH%: 26.3 % (ref 14.0–49.7)
MCH: 30.3 pg (ref 25.1–34.0)
MCHC: 34.3 g/dL (ref 31.5–36.0)
MCV: 88.2 fL (ref 79.5–101.0)
MONO#: 0.5 10*3/uL (ref 0.1–0.9)
MONO%: 5.5 % (ref 0.0–14.0)
NEUT%: 68.1 % (ref 38.4–76.8)
NEUTROS ABS: 5.5 10*3/uL (ref 1.5–6.5)
NRBC: 0 % (ref 0–0)
PLATELETS: 113 10*3/uL — AB (ref 145–400)
RBC: 4.33 10*6/uL (ref 3.70–5.45)
RDW: 14.6 % — AB (ref 11.2–14.5)
WBC: 8.1 10*3/uL (ref 3.9–10.3)
lymph#: 2.1 10*3/uL (ref 0.9–3.3)

## 2015-07-20 LAB — COMPREHENSIVE METABOLIC PANEL (CC13)
ALT: 51 U/L (ref 0–55)
AST: 27 U/L (ref 5–34)
Albumin: 4 g/dL (ref 3.5–5.0)
Alkaline Phosphatase: 79 U/L (ref 40–150)
Anion Gap: 9 mEq/L (ref 3–11)
BILIRUBIN TOTAL: 0.68 mg/dL (ref 0.20–1.20)
BUN: 11.5 mg/dL (ref 7.0–26.0)
CHLORIDE: 108 meq/L (ref 98–109)
CO2: 24 meq/L (ref 22–29)
CREATININE: 0.8 mg/dL (ref 0.6–1.1)
Calcium: 9.5 mg/dL (ref 8.4–10.4)
EGFR: 87 mL/min/{1.73_m2} — ABNORMAL LOW (ref >90)
GLUCOSE: 104 mg/dL (ref 70–140)
Potassium: 4 mEq/L (ref 3.5–5.1)
SODIUM: 141 meq/L (ref 136–145)
TOTAL PROTEIN: 6.7 g/dL (ref 6.4–8.3)

## 2015-07-20 MED ORDER — SODIUM CHLORIDE 0.9 % IV SOLN
Freq: Once | INTRAVENOUS | Status: AC
  Administered 2015-07-20: 09:00:00 via INTRAVENOUS
  Filled 2015-07-20: qty 5

## 2015-07-20 MED ORDER — PALONOSETRON HCL INJECTION 0.25 MG/5ML
0.2500 mg | Freq: Once | INTRAVENOUS | Status: AC
  Administered 2015-07-20: 0.25 mg via INTRAVENOUS

## 2015-07-20 MED ORDER — SODIUM CHLORIDE 0.9 % IV SOLN
1.4200 mg/m2 | Freq: Once | INTRAVENOUS | Status: AC
  Administered 2015-07-20: 3 mg via INTRAVENOUS
  Filled 2015-07-20: qty 6

## 2015-07-20 MED ORDER — HEPARIN SOD (PORK) LOCK FLUSH 100 UNIT/ML IV SOLN
500.0000 [IU] | Freq: Once | INTRAVENOUS | Status: AC | PRN
  Administered 2015-07-20: 500 [IU]
  Filled 2015-07-20: qty 5

## 2015-07-20 MED ORDER — SODIUM CHLORIDE 0.9 % IV SOLN
Freq: Once | INTRAVENOUS | Status: AC
  Administered 2015-07-20: 09:00:00 via INTRAVENOUS

## 2015-07-20 MED ORDER — SODIUM CHLORIDE 0.9 % IJ SOLN
10.0000 mL | INTRAMUSCULAR | Status: DC | PRN
  Administered 2015-07-20: 10 mL
  Filled 2015-07-20: qty 10

## 2015-07-20 MED ORDER — PALONOSETRON HCL INJECTION 0.25 MG/5ML
INTRAVENOUS | Status: AC
  Filled 2015-07-20: qty 5

## 2015-07-20 MED ORDER — LORAZEPAM 1 MG PO TABS: 1.0000 mg | ORAL_TABLET | Freq: Every day | ORAL | Status: DC

## 2015-07-20 NOTE — Telephone Encounter (Signed)
Gave avs & calendar for September. Sent message to schedule Fluids for 09/08

## 2015-07-20 NOTE — Patient Instructions (Signed)
[facility] Discharge Instructions for Patients Receiving Chemotherapy  Today you received the following chemotherapy agents: Halaven  To help prevent nausea and vomiting after your treatment, we encourage you to take your nausea medication as directed.    If you develop nausea and vomiting that is not controlled by your nausea medication, call the clinic.   BELOW ARE SYMPTOMS THAT SHOULD BE REPORTED IMMEDIATELY:  *FEVER GREATER THAN 100.5 F  *CHILLS WITH OR WITHOUT FEVER  NAUSEA AND VOMITING THAT IS NOT CONTROLLED WITH YOUR NAUSEA MEDICATION  *UNUSUAL SHORTNESS OF BREATH  *UNUSUAL BRUISING OR BLEEDING  TENDERNESS IN MOUTH AND THROAT WITH OR WITHOUT PRESENCE OF ULCERS  *URINARY PROBLEMS  *BOWEL PROBLEMS  UNUSUAL RASH Items with * indicate a potential emergency and should be followed up as soon as possible.  Feel free to call the clinic you have any questions or concerns. The clinic phone number is [phone].  Please show the CHEMO ALERT CARD at check-in to the Emergency Department and triage nurse.   

## 2015-07-20 NOTE — Progress Notes (Signed)
Patient Care Team: Mackie Pai, PA-C as PCP - General (Physician Assistant) Thea Silversmith, MD as Consulting Physician (Radiation Oncology) Consuela Mimes, MD as Consulting Physician (Hematology and Oncology) Nicholas Lose, MD as Consulting Physician (Hematology and Oncology)  DIAGNOSIS: Breast cancer metastasized to bone   Staging form: Breast, AJCC 7th Edition     Pathologic: Stage IIIC (T4d, N3, cM0) - Signed by [name], MD on 01/19/2014   SUMMARY OF ONCOLOGIC HISTORY:   Breast cancer metastasized to bone   12/07/2010 - 04/12/2011 Neo-Adjuvant Chemotherapy Neoadjuvant FEC x6 followed by Taxotere x4   12/26/2010 Procedure Genetic testing showed mutation for BRCA2 [id number]   05/12/2011 Surgery Bilateral mastectomy with left axillary dissection 11/21 positive lymph nodes 5.8 cm tumor ER 95% PR 0% Ki-67 61% HER-2 negative ratio 1.39 T3, N3, 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   07/03/2011 - 08/17/2011 Radiation Therapy Radiation therapy to the chest and axilla   09/11/2011 -  Anti-estrogen oral therapy Tamoxifen later switched to Arimidex 07/2012   04/22/2012 Surgery Bilateral salpingo-oophorectomy   06/10/2015 Imaging Bone scan: uptake right lateral frontal bone, left malar region, manubrium, right 6,8 ribs; CT-CAP: New extensive infiltrate left & right hilar/mediastinal, right axillary, bil lower neck LN, 3.7 cm LUL consolidation, RML nodules,Lt Pl eff, RPLN   06/18/2015 Initial Biopsy Right axillary lymph node biopsy: 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 She had nausea and vomiting issues along with major depression. She gets IV fluids with significant improvement in her symptoms. Today she feels much better  her nausea and is under better control. She is eating well. She is now on oral dexamethasone 1 mg daily.  REVIEW OF SYSTEMS:   Constitutional: Denies fevers, chills or abnormal weight loss Eyes: Denies blurriness of vision Ears, nose, mouth, throat, and face: Denies mucositis or sore throat Respiratory: Denies cough, dyspnea or wheezes Cardiovascular: Denies palpitation, chest discomfort or lower extremity swelling Gastrointestinal:  Denies nausea, heartburn or change in bowel habits Skin: Denies abnormal skin rashes Lymphatics: Denies new lymphadenopathy or easy bruising Neurological:Denies numbness, tingling or new weaknesses Behavioral/Psych: Mood is stable, no new changes  All other systems were reviewed with the patient and are negative.  I have reviewed the past medical history, past surgical history, social history and family history with the patient and they are unchanged from previous note.  ALLERGIES:  is allergic to acyclovir and related and doxycycline hydrochloride.  MEDICATIONS:  Current Outpatient Prescriptions  Medication Sig Dispense Refill  . ALPRAZolam (XANAX) 0.5 MG tablet Take 1 tablet (0.5 mg total) by mouth at bedtime as needed for sleep. 30 tablet 0  . anastrozole (ARIMIDEX) 1 MG tablet Take 1 tablet (1 mg total) by mouth daily. 90 tablet 6  . Calcium 200 MG TABS Take 200 mg by mouth daily.     . cholecalciferol (VITAMIN D) 1000 UNITS tablet Take 1,000 Units by mouth daily.     . citalopram (CELEXA) 40 MG tablet TAKE 1 TABLET BY MOUTH DAILY. 30 tablet 5  . dexamethasone (DECADRON) 4 MG tablet Take 0.5 tablets (2 mg total) by mouth daily. 30 tablet 1  . fluocinonide cream (LIDEX) 7.89 % Apply 1 application topically 2 (two) times daily as  needed (for rash.).    Marland Kitchen gabapentin (NEURONTIN) 300 MG capsule Take 1 capsule (300 mg total) by mouth daily. 30 capsule 5  . ibuprofen (ADVIL,MOTRIN) 200 MG tablet Take 600 mg by mouth every 8 (eight) hours as needed for moderate  pain (pain).     Marland Kitchen lidocaine-prilocaine (EMLA) cream Apply to affected area once 30 g 3  . LORazepam (ATIVAN) 1 MG tablet Take 1 tablet (1 mg total) by mouth at bedtime. 30 tablet 0  . naproxen sodium (ANAPROX) 220 MG tablet Take 220-440 mg by mouth 2 (two) times daily as needed (for muscle aches.).     Marland Kitchen omeprazole (PRILOSEC) 20 MG capsule Take 1 capsule (20 mg total) by mouth daily. 90 capsule 6  . ondansetron (ZOFRAN) 4 MG tablet Take 1 tablet (4 mg total) by mouth every 8 (eight) hours as needed for nausea or vomiting. 30 tablet 2  . ondansetron (ZOFRAN) 8 MG tablet Take 1 tablet (8 mg total) by mouth 2 (two) times daily. Start the day after chemo for 2 days. Then take as needed for nausea or vomiting. 30 tablet 1  . prochlorperazine (COMPAZINE) 10 MG tablet Take 1 tablet (10 mg total) by mouth every 6 (six) hours as needed (Nausea or vomiting). 30 tablet 1  . promethazine (PHENERGAN) 25 MG tablet Take 1 tablet (25 mg total) by mouth every 6 (six) hours as needed for nausea. 30 tablet 1  . ranitidine (ZANTAC) 150 MG tablet TAKE 1 TABLET BY MOUTH TWO TIMES DAILY. 60 tablet 2  . scopolamine (TRANSDERM-SCOP, 1.5 MG,) 1 MG/3DAYS Place 1 patch (1.5 mg total) onto the skin every 3 (three) days. 10 patch 12  . SUMAtriptan (IMITREX) 50 MG tablet Take 1 tablet (50 mg total) by mouth every 2 (two) hours as needed for migraine. May repeat in 2 hours if headache persists or recurs. 10 tablet 0  . traMADol (ULTRAM) 50 MG tablet Take 1 tablet (50 mg total) by mouth 3 (three) times daily as needed. (Patient taking differently: Take 50 mg by mouth 3 (three) times daily as needed for moderate pain. ) 30 tablet 0  . UNABLE TO FIND Dispense compression sleeve 20-30 mm for this patient with history of breast cancer s/p surgery 1 each 0   No current facility-administered medications for this visit.    PHYSICAL EXAMINATION: ECOG PERFORMANCE STATUS: 1 - Symptomatic but completely ambulatory  Filed Vitals:    07/20/15 0822  BP: 116/78  Pulse: 95  Temp: 98.2 F (36.8 C)  Resp: 18   Filed Weights   07/20/15 0822  Weight: 217 lb 3.2 oz (98.521 kg)    GENERAL:alert, no distress and comfortable SKIN: skin color, texture, turgor are normal, no rashes or significant lesions EYES: normal, Conjunctiva are pink and non-injected, sclera clear OROPHARYNX:no exudate, no erythema and lips, buccal mucosa, and tongue normal  NECK: supple, thyroid normal size, non-tender, without nodularity LYMPH:  no palpable lymphadenopathy in the cervical, axillary or inguinal LUNGS: clear to auscultation and percussion with normal breathing effort HEART: regular rate & rhythm and no murmurs and no lower extremity edema ABDOMEN:abdomen soft, non-tender and normal bowel sounds Musculoskeletal:no cyanosis of digits and no clubbing  NEURO: alert & oriented x 3 with fluent speech, no focal motor/sensory deficits  LABORATORY DATA:  I have reviewed the data as listed   Chemistry      Component Value Date/Time   NA 145 07/06/2015 1339   NA 138 06/04/2015 2136   K 3.9  07/06/2015 1339   K 3.9 06/04/2015 2136   CL 103 06/04/2015 2136   CL 108* 07/30/2012 1430   CO2 27 07/06/2015 1339   CO2 27 06/04/2015 2136   BUN 7.8 07/06/2015 1339   BUN 14 06/04/2015 2136   CREATININE 0.9 07/06/2015 1339   CREATININE 0.90 06/04/2015 2136      Component Value Date/Time   CALCIUM 9.6 07/06/2015 1339   CALCIUM 9.6 06/04/2015 2136   ALKPHOS 60 07/06/2015 1339   ALKPHOS 60 06/04/2015 1131   AST 39* 07/06/2015 1339   AST 18 06/04/2015 1131   ALT 62* 07/06/2015 1339   ALT 26 06/04/2015 1131   BILITOT 0.69 07/06/2015 1339   BILITOT 0.7 06/04/2015 1131       Lab Results  Component Value Date   WBC 8.1 07/20/2015   HGB 13.1 07/20/2015   HCT 38.2 07/20/2015   MCV 88.2 07/20/2015   PLT 113* 07/20/2015   NEUTROABS 5.5 07/20/2015   ASSESSMENT & PLAN:  Breast cancer metastasized to bone Right axillary lymph node biopsy  06/18/2015: 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 BRCA 2 mutation underwent salpingo-oophorectomy Status post bilateral mastectomies and radiation to left chest wall and axilla currently on tamoxifen since October 2012) ------------------------------------------------------------------------------------------------------------------------------------------------------- Treatment plan: Halaven day 1 and day 8 every 3 weeks 6 cycles following which we will consider starting letrozole with Ibrance Current treatment:cycle 2 day 1 Halaven Halaven toxicities: 1. Nausea and dry heaves: In spite of changing antiemetics regimen to Aloxi, patient continues to have persistent nausea and vomiting. We will add Emend to the next chemotherapy. Given oral dexamethasone that she takes daily. 2. Cough without expectoration 3. Elevation of AST and ALT: We will continue to watch and monitor this. 4. We consulted palliative care. 5. Patient will get IV fluids this Thursday. 6. Thrombocytopenia: We will monitor her platelet counts and we might need to dose reduce if they continue to drop below 100  Return to clinic in 1 week for cycle 2 day 8  No orders of the defined types were placed in this encounter.   The patient has a good understanding of the overall plan. she agrees with it. she will call with any problems that may develop before the next visit here.   Rulon Eisenmenger, MD

## 2015-07-20 NOTE — Assessment & Plan Note (Addendum)
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 BRCA 2 mutation underwent salpingo-oophorectomy Status post bilateral mastectomies and radiation to left chest wall and axilla currently on tamoxifen since October 2012) ------------------------------------------------------------------------------------------------------------------------------------------------------- Treatment plan: Halaven day 1 and day 8 every 3 weeks 6 cycles following which we will consider starting letrozole with Ibrance Current treatment:cycle 2 day 1 Halaven Halaven toxicities: 1. Nausea and dry heaves: In spite of changing antiemetics regimen to Aloxi, patient continues to have persistent nausea and vomiting. We will add Emend to the next chemotherapy. Given oral dexamethasone that she takes daily. 2. Cough without expectoration 3. Elevation of AST and ALT: We will continue to watch and monitor this. 4. We consulted palliative care.  Return to clinic in 1 week for cycle 2 day 8

## 2015-07-22 ENCOUNTER — Ambulatory Visit (HOSPITAL_COMMUNITY)
Admission: RE | Admit: 2015-07-22 | Discharge: 2015-07-22 | Disposition: A | Discharge status: Home / Self Care | Payer: Medicare Other | Source: Ambulatory Visit | Attending: Hematology and Oncology | Admitting: Hematology and Oncology

## 2015-07-22 ENCOUNTER — Other Ambulatory Visit: Payer: Self-pay | Admitting: Hematology and Oncology

## 2015-07-22 ENCOUNTER — Other Ambulatory Visit: Payer: Self-pay

## 2015-07-22 DIAGNOSIS — C7951 Secondary malignant neoplasm of bone: Secondary | ICD-10-CM | POA: Diagnosis not present

## 2015-07-22 DIAGNOSIS — C50911 Malignant neoplasm of unspecified site of right female breast: Secondary | ICD-10-CM | POA: Diagnosis not present

## 2015-07-22 MED ORDER — SODIUM CHLORIDE 0.9 % IV SOLN
INTRAVENOUS | Status: AC
  Administered 2015-07-22: 09:00:00 via INTRAVENOUS

## 2015-07-22 MED ORDER — HEPARIN SOD (PORK) LOCK FLUSH 100 UNIT/ML IV SOLN
500.0000 [IU] | INTRAVENOUS | Status: AC | PRN
  Administered 2015-07-22: 500 [IU]
  Filled 2015-07-22: qty 5

## 2015-07-22 MED ORDER — SODIUM CHLORIDE 0.9 % IJ SOLN
10.0000 mL | INTRAMUSCULAR | Status: AC | PRN
  Administered 2015-07-22: 10 mL

## 2015-07-22 NOTE — Procedures (Signed)
[facility]  Procedure Note  Tina Vaughan JQD:[id number] DOB: 12/29/1960 DOA: 07/22/2015   Dr. Lindi Adie  Associated Diagnosis: Breast Cancer metastasized to bone, right  Procedure Note: port accessed, blood return noted, IV fluids infused per order, port flushed and de-accessed per protocol   Condition During Procedure: patient stable, friend at bedside   Condition at Discharge: patient stable, denies any discomforts, ambulatory with friend   Roberto Scales, [facility]

## 2015-07-27 ENCOUNTER — Ambulatory Visit (HOSPITAL_BASED_OUTPATIENT_CLINIC_OR_DEPARTMENT_OTHER): Payer: Medicare Other

## 2015-07-27 ENCOUNTER — Telehealth: Payer: Self-pay | Admitting: Hematology and Oncology

## 2015-07-27 ENCOUNTER — Encounter: Payer: Self-pay | Admitting: Hematology and Oncology

## 2015-07-27 ENCOUNTER — Ambulatory Visit (HOSPITAL_BASED_OUTPATIENT_CLINIC_OR_DEPARTMENT_OTHER): Payer: Medicare Other | Admitting: Hematology and Oncology

## 2015-07-27 ENCOUNTER — Other Ambulatory Visit: Payer: Medicare Other

## 2015-07-27 ENCOUNTER — Ambulatory Visit: Payer: Medicare Other

## 2015-07-27 ENCOUNTER — Other Ambulatory Visit (HOSPITAL_BASED_OUTPATIENT_CLINIC_OR_DEPARTMENT_OTHER): Payer: Medicare Other

## 2015-07-27 VITALS — BP 110/78 | HR 100 | Temp 98.7°F | Resp 18 | Ht 62.0 in | Wt 217.9 lb

## 2015-07-27 DIAGNOSIS — R630 Anorexia: Secondary | ICD-10-CM

## 2015-07-27 DIAGNOSIS — Z5111 Encounter for antineoplastic chemotherapy: Secondary | ICD-10-CM | POA: Diagnosis not present

## 2015-07-27 DIAGNOSIS — C773 Secondary and unspecified malignant neoplasm of axilla and upper limb lymph nodes: Secondary | ICD-10-CM

## 2015-07-27 DIAGNOSIS — C7951 Secondary malignant neoplasm of bone: Principal | ICD-10-CM

## 2015-07-27 DIAGNOSIS — R740 Nonspecific elevation of levels of transaminase and lactic acid dehydrogenase [LDH]: Secondary | ICD-10-CM

## 2015-07-27 DIAGNOSIS — C50912 Malignant neoplasm of unspecified site of left female breast: Secondary | ICD-10-CM

## 2015-07-27 DIAGNOSIS — C50919 Malignant neoplasm of unspecified site of unspecified female breast: Secondary | ICD-10-CM

## 2015-07-27 DIAGNOSIS — D696 Thrombocytopenia, unspecified: Secondary | ICD-10-CM | POA: Diagnosis not present

## 2015-07-27 DIAGNOSIS — C50911 Malignant neoplasm of unspecified site of right female breast: Secondary | ICD-10-CM

## 2015-07-27 DIAGNOSIS — R05 Cough: Secondary | ICD-10-CM

## 2015-07-27 LAB — CBC WITH DIFFERENTIAL/PLATELET
BASO%: 1.4 % (ref 0.0–2.0)
Basophils Absolute: 0.1 10*3/uL (ref 0.0–0.1)
EOS ABS: 0 10*3/uL (ref 0.0–0.5)
EOS%: 0.1 % (ref 0.0–7.0)
HEMATOCRIT: 36.9 % (ref 34.8–46.6)
HEMOGLOBIN: 12.5 g/dL (ref 11.6–15.9)
LYMPH#: 1.3 10*3/uL (ref 0.9–3.3)
LYMPH%: 29.8 % (ref 14.0–49.7)
MCH: 29.8 pg (ref 25.1–34.0)
MCHC: 33.9 g/dL (ref 31.5–36.0)
MCV: 87.8 fL (ref 79.5–101.0)
MONO#: 0.2 10*3/uL (ref 0.1–0.9)
MONO%: 4.7 % (ref 0.0–14.0)
NEUT%: 64 % (ref 38.4–76.8)
NEUTROS ABS: 2.7 10*3/uL (ref 1.5–6.5)
Platelets: 234 10*3/uL (ref 145–400)
RBC: 4.21 10*6/uL (ref 3.70–5.45)
RDW: 14.1 % (ref 11.2–14.5)
WBC: 4.2 10*3/uL (ref 3.9–10.3)

## 2015-07-27 LAB — COMPREHENSIVE METABOLIC PANEL (CC13)
ALBUMIN: 3.7 g/dL (ref 3.5–5.0)
ALK PHOS: 67 U/L (ref 40–150)
ALT: 50 U/L (ref 0–55)
AST: 24 U/L (ref 5–34)
Anion Gap: 9 mEq/L (ref 3–11)
BILIRUBIN TOTAL: 0.67 mg/dL (ref 0.20–1.20)
BUN: 8.7 mg/dL (ref 7.0–26.0)
CO2: 25 mEq/L (ref 22–29)
CREATININE: 0.8 mg/dL (ref 0.6–1.1)
Calcium: 9.1 mg/dL (ref 8.4–10.4)
Chloride: 109 mEq/L (ref 98–109)
EGFR: >90 mL/min/{1.73_m2} (ref >90)
GLUCOSE: 107 mg/dL (ref 70–140)
Potassium: 3.7 mEq/L (ref 3.5–5.1)
SODIUM: 143 meq/L (ref 136–145)
TOTAL PROTEIN: 6.2 g/dL — AB (ref 6.4–8.3)

## 2015-07-27 MED ORDER — SODIUM CHLORIDE 0.9 % IV SOLN
Freq: Once | INTRAVENOUS | Status: AC
  Administered 2015-07-27: 09:00:00 via INTRAVENOUS

## 2015-07-27 MED ORDER — SODIUM CHLORIDE 0.9 % IV SOLN
1.4100 mg/m2 | Freq: Once | INTRAVENOUS | Status: AC
  Administered 2015-07-27: 3 mg via INTRAVENOUS
  Filled 2015-07-27: qty 6

## 2015-07-27 MED ORDER — HEPARIN SOD (PORK) LOCK FLUSH 100 UNIT/ML IV SOLN
500.0000 [IU] | Freq: Once | INTRAVENOUS | Status: AC | PRN
  Administered 2015-07-27: 500 [IU]
  Filled 2015-07-27: qty 5

## 2015-07-27 MED ORDER — SODIUM CHLORIDE 0.9 % IV SOLN
Freq: Once | INTRAVENOUS | Status: AC
  Administered 2015-07-27: 10:00:00 via INTRAVENOUS
  Filled 2015-07-27: qty 5

## 2015-07-27 MED ORDER — PEGFILGRASTIM 6 MG/0.6ML ~~LOC~~ PSKT
6.0000 mg | PREFILLED_SYRINGE | Freq: Once | SUBCUTANEOUS | Status: AC
  Administered 2015-07-27: 6 mg via SUBCUTANEOUS
  Filled 2015-07-27: qty 0.6

## 2015-07-27 MED ORDER — PALONOSETRON HCL INJECTION 0.25 MG/5ML
INTRAVENOUS | Status: AC
  Filled 2015-07-27: qty 5

## 2015-07-27 MED ORDER — SODIUM CHLORIDE 0.9 % IJ SOLN
10.0000 mL | INTRAMUSCULAR | Status: DC | PRN
  Administered 2015-07-27: 10 mL
  Filled 2015-07-27: qty 10

## 2015-07-27 MED ORDER — PALONOSETRON HCL INJECTION 0.25 MG/5ML
0.2500 mg | Freq: Once | INTRAVENOUS | Status: AC
  Administered 2015-07-27: 0.25 mg via INTRAVENOUS

## 2015-07-27 NOTE — Patient Instructions (Signed)
[facility] Discharge Instructions for Patients Receiving Chemotherapy  Today you received the following chemotherapy agents Halaven  To help prevent nausea and vomiting after your treatment, we encourage you to take your nausea medication as needed   If you develop nausea and vomiting that is not controlled by your nausea medication, call the clinic.   BELOW ARE SYMPTOMS THAT SHOULD BE REPORTED IMMEDIATELY:  *FEVER GREATER THAN 100.5 F  *CHILLS WITH OR WITHOUT FEVER  NAUSEA AND VOMITING THAT IS NOT CONTROLLED WITH YOUR NAUSEA MEDICATION  *UNUSUAL SHORTNESS OF BREATH  *UNUSUAL BRUISING OR BLEEDING  TENDERNESS IN MOUTH AND THROAT WITH OR WITHOUT PRESENCE OF ULCERS  *URINARY PROBLEMS  *BOWEL PROBLEMS  UNUSUAL RASH Items with * indicate a potential emergency and should be followed up as soon as possible.  Feel free to call the clinic you have any questions or concerns. The clinic phone number is [phone].  Please show the CHEMO ALERT CARD at check-in to the Emergency Department and triage nurse.   

## 2015-07-27 NOTE — Telephone Encounter (Signed)
Gave avs & calendar for September °

## 2015-07-27 NOTE — Assessment & Plan Note (Signed)
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 BRCA 2 mutation underwent salpingo-oophorectomy Status post bilateral mastectomies and radiation to left chest wall and axilla currently on tamoxifen since October 2012) ------------------------------------------------------------------------------------------------------------------------------------------------------- Treatment plan: Halaven day 1 and day 8 every 3 weeks 6 cycles following which we will consider starting letrozole with Ibrance Current treatment:cycle 2 day 8 Halaven Halaven toxicities: 1. Nausea and dry heaves: In spite of changing antiemetics regimen to Aloxi, patient continues to have persistent nausea and vomiting. We will add Emend to the next chemotherapy. Given oral dexamethasone that she takes daily. 2. Cough without expectoration 3. Elevation of AST and ALT: We will continue to watch and monitor this. 4. We consulted palliative care. 5.  6. Thrombocytopenia: We will monitor her platelet counts and we might need to dose reduce if they continue to drop below 100  Return to clinic in 2 weeks for cycle 3 day 1

## 2015-07-27 NOTE — Progress Notes (Signed)
Patient Care Team: Mackie Pai, PA-C as PCP - General (Physician Assistant) Thea Silversmith, MD as Consulting Physician (Radiation Oncology) Consuela Mimes, MD as Consulting Physician (Hematology and Oncology) Nicholas Lose, MD as Consulting Physician (Hematology and Oncology)  DIAGNOSIS: Breast cancer metastasized to bone   Staging form: Breast, AJCC 7th Edition     Pathologic: Stage IIIC (T4d, N3, cM0) - Signed by [name], MD on 01/19/2014   SUMMARY OF ONCOLOGIC HISTORY:   Breast cancer metastasized to bone   12/07/2010 - 04/12/2011 Neo-Adjuvant Chemotherapy Neoadjuvant FEC x6 followed by Taxotere x4   12/26/2010 Procedure Genetic testing showed mutation for BRCA2 [id number]   05/12/2011 Surgery Bilateral mastectomy with left axillary dissection 11/21 positive lymph nodes 5.8 cm tumor ER 95% PR 0% Ki-67 61% HER-2 negative ratio 1.39 T3, N3, 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   07/03/2011 - 08/17/2011 Radiation Therapy Radiation therapy to the chest and axilla   09/11/2011 -  Anti-estrogen oral therapy Tamoxifen later switched to Arimidex 07/2012   04/22/2012 Surgery Bilateral salpingo-oophorectomy   06/10/2015 Imaging Bone scan: uptake right lateral frontal bone, left malar region, manubrium, right 6,8 ribs; CT-CAP: New extensive infiltrate left & right hilar/mediastinal, right axillary, bil lower neck LN, 3.7 cm LUL consolidation, RML nodules,Lt Pl eff, RPLN   06/18/2015 Initial Biopsy Right axillary lymph node biopsy: 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 Today cycle 2 day 8. Her nausea and vomiting have improved significantly. This was because of adding Aloxi as well  as open dexamethasone daily. She even had [place]. She does not have good appetite and food does not taste right.  REVIEW OF SYSTEMS:   Constitutional: Denies fevers, chills or abnormal weight loss Eyes: Denies blurriness of vision Ears, nose, mouth, throat, and face: Denies mucositis or sore throat Respiratory: Denies cough, dyspnea or wheezes Cardiovascular: Denies palpitation, chest discomfort or lower extremity swelling Gastrointestinal:  Diminished appetite and taste Skin: Denies abnormal skin rashes Lymphatics: Denies new lymphadenopathy or easy bruising Neurological:Denies numbness, tingling or new weaknesses Behavioral/Psych: Mood is stable, no new changes  All other systems were reviewed with the patient and are negative.  I have reviewed the past medical history, past surgical history, social history and family history with the patient and they are unchanged from previous note.  ALLERGIES:  is allergic to acyclovir and related and doxycycline hydrochloride.  MEDICATIONS:  Current Outpatient Prescriptions  Medication Sig Dispense Refill  . ALPRAZolam (XANAX) 0.5 MG tablet Take 1 tablet (0.5 mg total) by mouth at bedtime as needed for sleep. 30 tablet 0  . anastrozole (ARIMIDEX) 1 MG tablet Take 1 tablet (1 mg total) by mouth daily. 90 tablet 6  . Calcium 200 MG TABS Take 200 mg by mouth daily.     . cholecalciferol (VITAMIN D) 1000 UNITS tablet Take 1,000 Units by mouth daily.     . citalopram (CELEXA) 40 MG tablet TAKE 1 TABLET BY MOUTH DAILY. 30 tablet 5  . dexamethasone (DECADRON) 4 MG tablet Take 0.5 tablets (2 mg total) by mouth daily. 30 tablet 1  . fluocinonide cream (LIDEX) 2.80 % Apply 1 application topically  2 (two) times daily as needed (for rash.).    Marland Kitchen gabapentin (NEURONTIN) 300 MG capsule Take 1 capsule (300 mg total) by mouth daily. 30 capsule 5  . ibuprofen (ADVIL,MOTRIN) 200 MG tablet Take 600 mg by mouth every 8  (eight) hours as needed for moderate pain (pain).     Marland Kitchen lidocaine-prilocaine (EMLA) cream Apply to affected area once 30 g 3  . LORazepam (ATIVAN) 1 MG tablet Take 1 tablet (1 mg total) by mouth at bedtime. 30 tablet 0  . naproxen sodium (ANAPROX) 220 MG tablet Take 220-440 mg by mouth 2 (two) times daily as needed (for muscle aches.).     Marland Kitchen omeprazole (PRILOSEC) 20 MG capsule Take 1 capsule (20 mg total) by mouth daily. 90 capsule 6  . ondansetron (ZOFRAN) 4 MG tablet Take 1 tablet (4 mg total) by mouth every 8 (eight) hours as needed for nausea or vomiting. 30 tablet 2  . ondansetron (ZOFRAN) 8 MG tablet Take 1 tablet (8 mg total) by mouth 2 (two) times daily. Start the day after chemo for 2 days. Then take as needed for nausea or vomiting. 30 tablet 1  . prochlorperazine (COMPAZINE) 10 MG tablet Take 1 tablet (10 mg total) by mouth every 6 (six) hours as needed (Nausea or vomiting). 30 tablet 1  . promethazine (PHENERGAN) 25 MG tablet Take 1 tablet (25 mg total) by mouth every 6 (six) hours as needed for nausea. 30 tablet 1  . ranitidine (ZANTAC) 150 MG tablet TAKE 1 TABLET BY MOUTH TWO TIMES DAILY. 60 tablet 2  . scopolamine (TRANSDERM-SCOP, 1.5 MG,) 1 MG/3DAYS Place 1 patch (1.5 mg total) onto the skin every 3 (three) days. 10 patch 12  . SUMAtriptan (IMITREX) 50 MG tablet Take 1 tablet (50 mg total) by mouth every 2 (two) hours as needed for migraine. May repeat in 2 hours if headache persists or recurs. 10 tablet 0  . traMADol (ULTRAM) 50 MG tablet Take 1 tablet (50 mg total) by mouth 3 (three) times daily as needed. (Patient taking differently: Take 50 mg by mouth 3 (three) times daily as needed for moderate pain. ) 30 tablet 0  . UNABLE TO FIND Dispense compression sleeve 20-30 mm for this patient with history of breast cancer s/p surgery 1 each 0   No current facility-administered medications for this visit.    PHYSICAL EXAMINATION: ECOG PERFORMANCE STATUS: 1 - Symptomatic but completely  ambulatory  Filed Vitals:   07/27/15 0846  BP: 110/78  Pulse: 100  Temp: 98.7 F (37.1 C)  Resp: 18   Filed Weights   07/27/15 0846  Weight: 217 lb 14.4 oz (98.839 kg)    GENERAL:alert, no distress and comfortable SKIN: skin color, texture, turgor are normal, no rashes or significant lesions EYES: normal, Conjunctiva are pink and non-injected, sclera clear OROPHARYNX:no exudate, no erythema and lips, buccal mucosa, and tongue normal  NECK: supple, thyroid normal size, non-tender, without nodularity LYMPH:  no palpable lymphadenopathy in the cervical, axillary or inguinal LUNGS: clear to auscultation and percussion with normal breathing effort HEART: regular rate & rhythm and no murmurs and no lower extremity edema ABDOMEN:abdomen soft, non-tender and normal bowel sounds Musculoskeletal:no cyanosis of digits and no clubbing  NEURO: alert & oriented x 3 with fluent speech, no focal motor/sensory deficits  LABORATORY DATA:  I have reviewed the data as listed   Chemistry      Component Value Date/Time   NA 141 07/20/2015 0810   NA 138 06/04/2015  2136   K 4.0 07/20/2015 0810   K 3.9 06/04/2015 2136   CL 103 06/04/2015 2136   CL 108* 07/30/2012 1430   CO2 24 07/20/2015 0810   CO2 27 06/04/2015 2136   BUN 11.5 07/20/2015 0810   BUN 14 06/04/2015 2136   CREATININE 0.8 07/20/2015 0810   CREATININE 0.90 06/04/2015 2136      Component Value Date/Time   CALCIUM 9.5 07/20/2015 0810   CALCIUM 9.6 06/04/2015 2136   ALKPHOS 79 07/20/2015 0810   ALKPHOS 60 06/04/2015 1131   AST 27 07/20/2015 0810   AST 18 06/04/2015 1131   ALT 51 07/20/2015 0810   ALT 26 06/04/2015 1131   BILITOT 0.68 07/20/2015 0810   BILITOT 0.7 06/04/2015 1131       Lab Results  Component Value Date   WBC 4.2 07/27/2015   HGB 12.5 07/27/2015   HCT 36.9 07/27/2015   MCV 87.8 07/27/2015   PLT 234 07/27/2015   NEUTROABS 2.7 07/27/2015   ASSESSMENT & PLAN:  Breast cancer metastasized to bone Right  axillary lymph node biopsy 06/18/2015: 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 BRCA 2 mutation underwent salpingo-oophorectomy Status post bilateral mastectomies and radiation to left chest wall and axilla currently on tamoxifen since October 2012) ------------------------------------------------------------------------------------------------------------------------------------------------------- Treatment plan: Halaven day 1 and day 8 every 3 weeks 6 cycles following which we will consider starting letrozole with Ibrance Current treatment:cycle 2 day 8 Halaven Halaven toxicities: 1. Nausea and dry heaves: In spite of changing antiemetics regimen to Aloxi, patient continues to have persistent nausea and vomiting. We added Emend and oral dexamethasone which completely resolved her nausea. 2. Cough without expectoration 3. Elevation of AST and ALT: We will continue to watch and monitor this. 4. We consulted palliative care. 5. Thrombocytopenia: Improved with platelet count of 274 on 07/27/2015 6. Profound decreased appetite:  encourage the patient to continue to eat good nutritious diet.  I will discuss with her about adding bisphosphonate therapy for suspicious bone metastases.  Return to clinic in 2 weeks for cycle 3 day 1   No orders of the defined types were placed in this encounter.   The patient has a good understanding of the overall plan. she agrees with it. she will call with any problems that may develop before the next visit  here.   Rulon Eisenmenger, MD     Thurmond Butts is

## 2015-07-31 ENCOUNTER — Encounter (HOSPITAL_COMMUNITY): Payer: Self-pay | Admitting: *Deleted

## 2015-07-31 ENCOUNTER — Emergency Department (HOSPITAL_COMMUNITY)
Admission: EM | Admit: 2015-07-31 | Discharge: 2015-07-31 | Disposition: A | Discharge status: Home / Self Care | Payer: Medicare Other | Attending: Physician Assistant | Admitting: Physician Assistant

## 2015-07-31 DIAGNOSIS — F329 Major depressive disorder, single episode, unspecified: Secondary | ICD-10-CM | POA: Diagnosis not present

## 2015-07-31 DIAGNOSIS — K219 Gastro-esophageal reflux disease without esophagitis: Secondary | ICD-10-CM | POA: Diagnosis not present

## 2015-07-31 DIAGNOSIS — Z872 Personal history of diseases of the skin and subcutaneous tissue: Secondary | ICD-10-CM | POA: Insufficient documentation

## 2015-07-31 DIAGNOSIS — R000 Tachycardia, unspecified: Secondary | ICD-10-CM | POA: Diagnosis not present

## 2015-07-31 DIAGNOSIS — Z853 Personal history of malignant neoplasm of breast: Secondary | ICD-10-CM | POA: Insufficient documentation

## 2015-07-31 DIAGNOSIS — R110 Nausea: Secondary | ICD-10-CM

## 2015-07-31 DIAGNOSIS — E669 Obesity, unspecified: Secondary | ICD-10-CM | POA: Diagnosis not present

## 2015-07-31 DIAGNOSIS — Z79899 Other long term (current) drug therapy: Secondary | ICD-10-CM | POA: Diagnosis not present

## 2015-07-31 DIAGNOSIS — F419 Anxiety disorder, unspecified: Secondary | ICD-10-CM | POA: Insufficient documentation

## 2015-07-31 DIAGNOSIS — R112 Nausea with vomiting, unspecified: Secondary | ICD-10-CM | POA: Insufficient documentation

## 2015-07-31 DIAGNOSIS — G629 Polyneuropathy, unspecified: Secondary | ICD-10-CM | POA: Insufficient documentation

## 2015-07-31 LAB — CBC WITH DIFFERENTIAL/PLATELET
BASOS ABS: 0 10*3/uL (ref 0.0–0.1)
BASOS PCT: 0 %
EOS ABS: 0 10*3/uL (ref 0.0–0.7)
Eosinophils Relative: 0 %
HCT: 35.5 % — ABNORMAL LOW (ref 36.0–46.0)
HEMOGLOBIN: 12.1 g/dL (ref 12.0–15.0)
LYMPHS PCT: 18 %
Lymphs Abs: 2.5 10*3/uL (ref 0.7–4.0)
MCH: 30.3 pg (ref 26.0–34.0)
MCHC: 34.1 g/dL (ref 30.0–36.0)
MCV: 88.8 fL (ref 78.0–100.0)
MONO ABS: 1.1 10*3/uL — AB (ref 0.1–1.0)
Monocytes Relative: 8 %
NEUTROS ABS: 10.4 10*3/uL — AB (ref 1.7–7.7)
NEUTROS PCT: 74 %
PLATELETS: 181 10*3/uL (ref 150–400)
RBC: 4 MIL/uL (ref 3.87–5.11)
RDW: 15 % (ref 11.5–15.5)
WBC Morphology: INCREASED
WBC: 14 10*3/uL — ABNORMAL HIGH (ref 4.0–10.5)

## 2015-07-31 LAB — COMPREHENSIVE METABOLIC PANEL
ALK PHOS: 90 U/L (ref 38–126)
ALT: 36 U/L (ref 14–54)
ANION GAP: 11 (ref 5–15)
AST: 31 U/L (ref 15–41)
Albumin: 3.9 g/dL (ref 3.5–5.0)
BILIRUBIN TOTAL: 0.8 mg/dL (ref 0.3–1.2)
BUN: 8 mg/dL (ref 6–20)
CALCIUM: 9.4 mg/dL (ref 8.9–10.3)
CO2: 24 mmol/L (ref 22–32)
CREATININE: 0.73 mg/dL (ref 0.44–1.00)
Chloride: 106 mmol/L (ref 101–111)
GFR calc non Af Amer: >60 mL/min (ref >60)
GLUCOSE: 119 mg/dL — AB (ref 65–99)
Potassium: 3.1 mmol/L — ABNORMAL LOW (ref 3.5–5.1)
Sodium: 141 mmol/L (ref 135–145)
TOTAL PROTEIN: 6.5 g/dL (ref 6.5–8.1)

## 2015-07-31 MED ORDER — SODIUM CHLORIDE 0.9 % IV BOLUS (SEPSIS)
1000.0000 mL | Freq: Once | INTRAVENOUS | Status: AC
  Administered 2015-07-31: 1000 mL via INTRAVENOUS

## 2015-07-31 MED ORDER — HEPARIN SOD (PORK) LOCK FLUSH 100 UNIT/ML IV SOLN
500.0000 [IU] | Freq: Once | INTRAVENOUS | Status: AC
  Administered 2015-07-31: 500 [IU]
  Filled 2015-07-31: qty 5

## 2015-07-31 MED ORDER — PROMETHAZINE HCL 25 MG/ML IJ SOLN
12.5000 mg | Freq: Once | INTRAMUSCULAR | Status: AC
  Administered 2015-07-31: 12.5 mg via INTRAVENOUS
  Filled 2015-07-31: qty 1

## 2015-07-31 NOTE — ED Notes (Signed)
Pt is alert and oriented , NAD, she is fatigued and nauseated,

## 2015-07-31 NOTE — Discharge Instructions (Signed)

## 2015-07-31 NOTE — ED Provider Notes (Addendum)
CSN: [id number]     Arrival date & time 07/31/15  1837 History   First MD Initiated Contact with Patient 07/31/15 1859     Chief Complaint  Patient presents with  . ca pt, nausea      (Consider location/radiation/quality/duration/timing/severity/associated sxs/prior Treatment) HPI   She is a 54 year old female with stage IV metastatic breast cancer. Patient is presenting today with nausea. Patient reports she 2 Zofran at home and has not had relief from the nausea. She did not call cancer Center  bc she does not think anyone be working this weekend. She reports no abdominal pain no fevers. She has  isolated nausea.  Past Medical History  Diagnosis Date  . Frequent urination   . Depression   . Sleep apnea   . Eczema   . Neuromuscular disorder   . GERD (gastroesophageal reflux disease)   . 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 Lymphedema   . Anxiety   . Thyroid disease     Possible would like to be evaluated  . Headache   . Obesity   . Breast cancer, stage 3 2012    left; inflammatory   Past Surgical History  Procedure Laterality Date  . Mastectomy Bilateral 05/12/11    total mastectomy on right, radical mastectomy on left  . Portacath placement    . Laparoscopy  03/14/2012    Procedure: LAPAROSCOPY OPERATIVE;  Surgeon: Osborne Oman, MD;  Location: [facility];  Service: Gynecology;  Laterality: N/A;  . Salpingoophorectomy  03/14/2012    Procedure: SALPINGO OOPHERECTOMY;  Surgeon: Osborne Oman, MD;  Location: [facility];  Service: Gynecology;  Laterality: Bilateral;  . Port-a-cath removal  04/19/2012    Procedure: MINOR REMOVAL PORT-A-CATH;  Surgeon: Haywood Lasso, MD;  Location: [facility];  Service: General;  Laterality: N/A;  . Oophorectomy    . Portacath      Placement    Family History  Problem Relation Age of Onset  . Breast cancer Mother   . Stroke Father   . Hypertension Father   . Pancreatic cancer Mother     pancreatic cancer x 2   . Diabetes Mother     after pancreas removed  . Heart disease Father   . Colon cancer Neg Hx   . Esophageal cancer Neg Hx   . Colitis Neg Hx   . Crohn's disease Neg Hx   . Healthy Sister   . Migraines Sister    Social History  Substance Use Topics  . Smoking status: Never Smoker   . Smokeless tobacco: Never Used  . Alcohol Use: 0.0 oz/week    0 Standard drinks or equivalent per week     Comment: rare   OB History    Gravida Para Term Preterm AB TAB SAB Ectopic Multiple Living   1    1 1          Review of Systems  Constitutional: Negative for activity change and fatigue.  HENT: Negative for congestion and drooling.   Eyes: Negative for discharge.  Respiratory: Negative for cough and chest tightness.   Cardiovascular: Negative for chest pain.  Gastrointestinal: Positive for nausea and vomiting. Negative for abdominal distention.  Genitourinary: Negative for dysuria and difficulty urinating.  Musculoskeletal: Negative for joint swelling.  Skin: Negative for rash.  Allergic/Immunologic: Negative for immunocompromised state.  Neurological: Negative for headaches.  Psychiatric/Behavioral: Negative for behavioral problems and agitation.      Allergies  Acyclovir and related and Doxycycline hydrochloride  Home Medications   Prior to Admission medications   Medication Sig Start Date End Date Taking? Authorizing Provider  ALPRAZolam Duanne Moron) 0.5 MG tablet Take 1 tablet (0.5 mg total) by mouth at bedtime as needed for sleep. 12/10/13   Consuela Mimes, MD  anastrozole (ARIMIDEX) 1 MG tablet Take 1 tablet (1 mg total) by mouth daily. 07/22/13   Consuela Mimes, MD  Calcium 200 MG TABS Take 200 mg by mouth daily.     Historical Provider, MD  cholecalciferol (VITAMIN D) 1000 UNITS tablet Take 1,000 Units by mouth daily.     Historical Provider, MD  citalopram (CELEXA) 40 MG tablet TAKE 1 TABLET BY MOUTH DAILY. 09/30/14   Nicholas Lose, MD  dexamethasone (DECADRON) 4 MG tablet Take 0.5  tablets (2 mg total) by mouth daily. 07/13/15   Nicholas Lose, MD  fluocinonide cream (LIDEX) 3.50 % Apply 1 application topically 2 (two) times daily as needed (for rash.).    Historical Provider, MD  gabapentin (NEURONTIN) 300 MG capsule Take 1 capsule (300 mg total) by mouth daily. 02/10/14   Consuela Mimes, MD  ibuprofen (ADVIL,MOTRIN) 200 MG tablet Take 600 mg by mouth every 8 (eight) hours as needed for moderate pain (pain).     Historical Provider, MD  lidocaine-prilocaine (EMLA) cream Apply to affected area once 06/24/15   Nicholas Lose, MD  LORazepam (ATIVAN) 1 MG tablet Take 1 tablet (1 mg total) by mouth at bedtime. 07/20/15   Nicholas Lose, MD  naproxen sodium (ANAPROX) 220 MG tablet Take 220-440 mg by mouth 2 (two) times daily as needed (for muscle aches.).     Historical Provider, MD  omeprazole (PRILOSEC) 20 MG capsule Take 1 capsule (20 mg total) by mouth daily. 07/22/13   Consuela Mimes, MD  ondansetron (ZOFRAN) 4 MG tablet Take 1 tablet (4 mg total) by mouth every 8 (eight) hours as needed for nausea or vomiting. 05/18/15   Susanne Borders, NP  ondansetron (ZOFRAN) 8 MG tablet Take 1 tablet (8 mg total) by mouth 2 (two) times daily. Start the day after chemo for 2 days. Then take as needed for nausea or vomiting. 06/24/15   Nicholas Lose, MD  prochlorperazine (COMPAZINE) 10 MG tablet Take 1 tablet (10 mg total) by mouth every 6 (six) hours as needed (Nausea or vomiting). 06/24/15   Nicholas Lose, MD  promethazine (PHENERGAN) 25 MG tablet Take 1 tablet (25 mg total) by mouth every 6 (six) hours as needed for nausea. 06/01/15   Nicholas Lose, MD  ranitidine (ZANTAC) 150 MG tablet TAKE 1 TABLET BY MOUTH TWO TIMES DAILY. 07/05/15   Percell Miller Saguier, PA-C  scopolamine (TRANSDERM-SCOP, 1.5 MG,) 1 MG/3DAYS Place 1 patch (1.5 mg total) onto the skin every 3 (three) days. 06/16/15   Nicholas Lose, MD  SUMAtriptan (IMITREX) 50 MG tablet Take 1 tablet (50 mg total) by mouth every 2 (two) hours as needed for migraine. May  repeat in 2 hours if headache persists or recurs. 05/21/15   Percell Miller Saguier, PA-C  traMADol (ULTRAM) 50 MG tablet Take 1 tablet (50 mg total) by mouth 3 (three) times daily as needed. Patient taking differently: Take 50 mg by mouth 3 (three) times daily as needed for moderate pain.  06/21/15   Nicholas Lose, MD  UNABLE TO FIND Dispense compression sleeve 20-30 mm for this patient with history of breast cancer s/p surgery 09/14/14   Nicholas Lose, MD   BP 130/82 mmHg  Pulse 104  Temp(Src) 98.7 F (37.1 C) (Oral)  Resp 17  SpO2 93% Physical Exam  Constitutional: She is oriented to person, place, and time. She appears well-developed and well-nourished.  HENT:  Head: Normocephalic and atraumatic.  Dry mucous members  Eyes: Conjunctivae are normal. Right eye exhibits no discharge.  Neck: Neck supple.  Cardiovascular: Regular rhythm and normal heart sounds.   No murmur heard. Tachycardic  Pulmonary/Chest: Effort normal and breath sounds normal. She has no wheezes. She has no rales.  Port present  Abdominal: Soft. She exhibits no distension. There is no tenderness.  Musculoskeletal: Normal range of motion. She exhibits no edema.  Neurological: She is oriented to person, place, and time. No cranial nerve deficit.  Skin: Skin is warm and dry. No rash noted. She is not diaphoretic.  Psychiatric: She has a normal mood and affect. Her behavior is normal.  Nursing note and vitals reviewed.   ED Course  Procedures (including critical care time) Labs Review Labs Reviewed  COMPREHENSIVE METABOLIC PANEL  CBC WITH DIFFERENTIAL/PLATELET    Imaging Review No results found. I have personally reviewed and evaluated these images and lab results as part of my medical decision-making.   EKG Interpretation None      MDM   Final diagnoses:  None   Patient is a 54 year old female presenting today with nausea. She has endstage stage IV metastatic cancer. We will give her fluids today. We will try a  different antiemetic Zofran with her. We will check CBC and Chem-7 to make sure that she has not had any dangerous electrolyte abdomen mildly's. Given her relatively normal vital signs, and normal phsyical exam anticipate ability to discharge home with a new antiemetic medication.    Courteney Julio Alm, MD 07/31/15 1928  Courteney Julio Alm, MD 07/31/15 3614

## 2015-07-31 NOTE — ED Notes (Signed)
Pt reports currently being treated for breast cancer, that has spread to lungs, lymph nodes, and bones. Pt received chemo on 9/13. Pt reports chronic chest and lymph node pain pain 4/10. Complaint today is nausea and dry heaving. Pt has taking 2 PO doses of zofran with no relief.

## 2015-08-03 ENCOUNTER — Ambulatory Visit (HOSPITAL_BASED_OUTPATIENT_CLINIC_OR_DEPARTMENT_OTHER): Payer: Medicare Other

## 2015-08-03 VITALS — BP 109/69 | HR 105 | Temp 98.3°F | Resp 18

## 2015-08-03 DIAGNOSIS — C7951 Secondary malignant neoplasm of bone: Secondary | ICD-10-CM

## 2015-08-03 DIAGNOSIS — C50912 Malignant neoplasm of unspecified site of left female breast: Secondary | ICD-10-CM | POA: Diagnosis not present

## 2015-08-03 DIAGNOSIS — C773 Secondary and unspecified malignant neoplasm of axilla and upper limb lymph nodes: Secondary | ICD-10-CM

## 2015-08-03 DIAGNOSIS — R110 Nausea: Secondary | ICD-10-CM | POA: Diagnosis present

## 2015-08-03 DIAGNOSIS — R112 Nausea with vomiting, unspecified: Secondary | ICD-10-CM

## 2015-08-03 DIAGNOSIS — T451X5A Adverse effect of antineoplastic and immunosuppressive drugs, initial encounter: Principal | ICD-10-CM

## 2015-08-03 MED ORDER — SODIUM CHLORIDE 0.9 % IV SOLN
1000.0000 mL | Freq: Once | INTRAVENOUS | Status: AC
Start: 2015-08-03 — End: 2015-08-03
  Administered 2015-08-03: 1000 mL via INTRAVENOUS

## 2015-08-03 MED ORDER — SODIUM CHLORIDE 0.9 % IV SOLN
Freq: Once | INTRAVENOUS | Status: AC
  Administered 2015-08-03: 09:00:00 via INTRAVENOUS
  Filled 2015-08-03: qty 4

## 2015-08-03 MED ORDER — SODIUM CHLORIDE 0.9 % IJ SOLN
10.0000 mL | INTRAMUSCULAR | Status: DC | PRN
  Administered 2015-08-03: 10 mL
  Filled 2015-08-03: qty 10

## 2015-08-03 MED ORDER — HEPARIN SOD (PORK) LOCK FLUSH 100 UNIT/ML IV SOLN
500.0000 [IU] | Freq: Once | INTRAVENOUS | Status: AC | PRN
  Administered 2015-08-03: 500 [IU]
  Filled 2015-08-03: qty 5

## 2015-08-03 NOTE — Patient Instructions (Signed)
Dehydration, Adult Dehydration is when you lose more fluids from the body than you take in. Vital organs like the kidneys, brain, and heart cannot function without a proper amount of fluids and salt. Any loss of fluids from the body can cause dehydration.  CAUSES   Vomiting.  Diarrhea.  Excessive sweating.  Excessive urine output.  Fever. SYMPTOMS  Mild dehydration  Thirst.  Dry lips.  Slightly dry mouth. Moderate dehydration  Very dry mouth.  Sunken eyes.  Skin does not bounce back quickly when lightly pinched and released.  Dark urine and decreased urine production.  Decreased tear production.  Headache. Severe dehydration  Very dry mouth.  Extreme thirst.  Rapid, weak pulse (more than 100 beats per minute at rest).  Cold hands and feet.  Not able to sweat in spite of heat and temperature.  Rapid breathing.  Blue lips.  Confusion and lethargy.  Difficulty being awakened.  Minimal urine production.  No tears. DIAGNOSIS  Your caregiver will diagnose dehydration based on your symptoms and your exam. Blood and urine tests will help confirm the diagnosis. The diagnostic evaluation should also identify the cause of dehydration. TREATMENT  Treatment of mild or moderate dehydration can often be done at home by increasing the amount of fluids that you drink. It is best to drink small amounts of fluid more often. Drinking too much at one time can make vomiting worse. Refer to the home care instructions below. Severe dehydration needs to be treated at the hospital where you will probably be given intravenous (IV) fluids that contain water and electrolytes. HOME CARE INSTRUCTIONS   Ask your caregiver about specific rehydration instructions.  Drink enough fluids to keep your urine clear or pale yellow.  Drink small amounts frequently if you have nausea and vomiting.  Eat as you normally do.  Avoid:  Foods or drinks high in sugar.  Carbonated  drinks.  Juice.  Extremely hot or cold fluids.  Drinks with caffeine.  Fatty, greasy foods.  Alcohol.  Tobacco.  Overeating.  Gelatin desserts.  Wash your hands well to avoid spreading bacteria and viruses.  Only take over-the-counter or prescription medicines for pain, discomfort, or fever as directed by your caregiver.  Ask your caregiver if you should continue all prescribed and over-the-counter medicines.  Keep all follow-up appointments with your caregiver. SEEK MEDICAL CARE IF:  You have abdominal pain and it increases or stays in one area (localizes).  You have a rash, stiff neck, or severe headache.  You are irritable, sleepy, or difficult to awaken.  You are weak, dizzy, or extremely thirsty. SEEK IMMEDIATE MEDICAL CARE IF:   You are unable to keep fluids down or you get worse despite treatment.  You have frequent episodes of vomiting or diarrhea.  You have blood or green matter (bile) in your vomit.  You have blood in your stool or your stool looks black and tarry.  You have not urinated in 6 to 8 hours, or you have only urinated a small amount of very dark urine.  You have a fever.  You faint. MAKE SURE YOU:   Understand these instructions.  Will watch your condition.  Will get help right away if you are not doing well or get worse. Document Released: 10/30/2005 Document Revised: 01/22/2012 Document Reviewed: 06/19/2011 Exit[employer] Patient Information 2015 [employer]. This information is not intended to replace advice given to you by your health care provider. Make sure you discuss any questions you have with your health care   provider.  

## 2015-08-04 ENCOUNTER — Ambulatory Visit (HOSPITAL_BASED_OUTPATIENT_CLINIC_OR_DEPARTMENT_OTHER): Payer: Medicare Other | Admitting: Hematology and Oncology

## 2015-08-04 ENCOUNTER — Telehealth: Payer: Self-pay

## 2015-08-04 ENCOUNTER — Other Ambulatory Visit: Payer: Self-pay

## 2015-08-04 ENCOUNTER — Encounter: Payer: Self-pay | Admitting: Hematology and Oncology

## 2015-08-04 VITALS — BP 120/84 | HR 93 | Temp 98.4°F | Resp 18 | Ht 62.0 in | Wt 217.5 lb

## 2015-08-04 DIAGNOSIS — C50911 Malignant neoplasm of unspecified site of right female breast: Secondary | ICD-10-CM

## 2015-08-04 DIAGNOSIS — F411 Generalized anxiety disorder: Secondary | ICD-10-CM

## 2015-08-04 DIAGNOSIS — R064 Hyperventilation: Secondary | ICD-10-CM | POA: Insufficient documentation

## 2015-08-04 DIAGNOSIS — R0600 Dyspnea, unspecified: Secondary | ICD-10-CM | POA: Insufficient documentation

## 2015-08-04 DIAGNOSIS — R0602 Shortness of breath: Secondary | ICD-10-CM | POA: Insufficient documentation

## 2015-08-04 DIAGNOSIS — R112 Nausea with vomiting, unspecified: Secondary | ICD-10-CM

## 2015-08-04 DIAGNOSIS — R000 Tachycardia, unspecified: Secondary | ICD-10-CM | POA: Insufficient documentation

## 2015-08-04 DIAGNOSIS — T451X5A Adverse effect of antineoplastic and immunosuppressive drugs, initial encounter: Secondary | ICD-10-CM

## 2015-08-04 DIAGNOSIS — R0682 Tachypnea, not elsewhere classified: Secondary | ICD-10-CM | POA: Insufficient documentation

## 2015-08-04 DIAGNOSIS — C7951 Secondary malignant neoplasm of bone: Secondary | ICD-10-CM

## 2015-08-04 DIAGNOSIS — R0689 Other abnormalities of breathing: Secondary | ICD-10-CM

## 2015-08-04 NOTE — Assessment & Plan Note (Signed)
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 BRCA 2 mutation underwent salpingo-oophorectomy Status post bilateral mastectomies and radiation to left chest wall and axilla currently on tamoxifen since October 2012) ------------------------------------------------------------------------------------------------------------------------------------------------------- Treatment plan: Halaven day 1 and day 8 every 3 weeks 6 cycles following which we will consider starting letrozole with Ibrance Current treatment:cycle 2 day 16 Halaven Halaven toxicities: 1. Nausea and dry heaves: In spite of changing antiemetics regimen to Aloxi, patient continues to have persistent nausea and vomiting. We added Emend and oral dexamethasone which completely resolved her nausea. 2. Cough without expectoration 3. Elevation of AST and ALT: We will continue to watch and monitor this. 4. We consulted palliative care. 5. Thrombocytopenia: Improved with platelet count of 274 on 07/27/2015 6. Profound decreased appetite: encourage the patient to continue to eat good nutritious diet.  I will discuss with her about adding bisphosphonate therapy for suspicious bone metastases.  Return to clinic as previously scheduled for cycle 3 day 1

## 2015-08-04 NOTE — Progress Notes (Signed)
Order for home health oxygen therapy faxed to [facility].  Sent to scan.

## 2015-08-04 NOTE — Telephone Encounter (Signed)
Received call from triage reporting pt calling in with shortness of breath.  Called and spoke with patient.  Pt is very obviously short of breath on the phone, unable to complete a sentence without stopping to breath.  Pt reports it is worse than her last visit on 9/13.  Appointment made for 315 with Dr. Lindi Adie.  Pt voiced understanding.

## 2015-08-04 NOTE — Progress Notes (Signed)
Patient Care Team: Mackie Pai, PA-C as PCP - General (Physician Assistant) Thea Silversmith, MD as Consulting Physician (Radiation Oncology) Consuela Mimes, MD as Consulting Physician (Hematology and Oncology) Nicholas Lose, MD as Consulting Physician (Hematology and Oncology)  DIAGNOSIS: Breast cancer metastasized to bone   Staging form: Breast, AJCC 7th Edition     Pathologic: Stage IIIC (T4d, N3, cM0) - Signed by [name], MD on 01/19/2014   SUMMARY OF ONCOLOGIC HISTORY:   Breast cancer metastasized to bone   12/07/2010 - 04/12/2011 Neo-Adjuvant Chemotherapy Neoadjuvant FEC x6 followed by Taxotere x4   12/26/2010 Procedure Genetic testing showed mutation for BRCA2 [id number]   05/12/2011 Surgery Bilateral mastectomy with left axillary dissection 11/21 positive lymph nodes 5.8 cm tumor ER 95% PR 0% Ki-67 61% HER-2 negative ratio 1.39 T3, N3, 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   07/03/2011 - 08/17/2011 Radiation Therapy Radiation therapy to the chest and axilla   09/11/2011 -  Anti-estrogen oral therapy Tamoxifen later switched to Arimidex 07/2012   04/22/2012 Surgery Bilateral salpingo-oophorectomy   06/10/2015 Imaging Bone scan: uptake right lateral frontal bone, left malar region, manubrium, right 6,8 ribs; CT-CAP: New extensive infiltrate left & right hilar/mediastinal, right axillary, bil lower neck LN, 3.7 cm LUL consolidation, RML nodules,Lt Pl eff, RPLN   06/18/2015 Initial Biopsy Right axillary lymph node biopsy: 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 He came in for an urgent visit complaining of severe shortness of breath. She went to the emergency  room on the weekend complaining of dry heaves. She did get better with IV Phenergan. She has not been taking any oral Phenergan. She was trying Zofran which has not helped her significantly. She is also complaining today of worsening shortness of breath. She is very concerned that the cancer is progressing. She is also concerned that the chemotherapy is not being effective.  REVIEW OF SYSTEMS:   Constitutional: Denies fevers, chills or abnormal weight loss Eyes: Denies blurriness of vision Ears, nose, mouth, throat, and face: Denies mucositis or sore throat Respiratory: Dry cough and shortness of breath at rest Cardiovascular: Denies palpitation, chest discomfort or lower extremity swelling Gastrointestinal:  Denies nausea, heartburn or change in bowel habits Skin: Denies abnormal skin rashes Lymphatics: Denies new lymphadenopathy or easy bruising Neurological:Denies numbness, tingling or new weaknesses Behavioral/Psych: Mood is stable, no new changes  All other systems were reviewed with the patient and are negative.  I have reviewed the past medical history, past surgical history, social history and family history with the patient and they are unchanged from previous note.  ALLERGIES:  is allergic to acyclovir and related and doxycycline hydrochloride.  MEDICATIONS:  Current Outpatient Prescriptions  Medication Sig Dispense Refill  . ALPRAZolam (XANAX) 0.5 MG tablet Take 1 tablet (0.5 mg total) by mouth at bedtime as needed for sleep. 30 tablet 0  . anastrozole (ARIMIDEX) 1 MG tablet Take 1 tablet (1 mg total) by mouth daily. 90 tablet 6  . Calcium 200 MG TABS Take 200 mg by mouth daily.     . cholecalciferol (VITAMIN D) 1000 UNITS tablet Take 1,000 Units by mouth daily.     . citalopram (  CELEXA) 40 MG tablet TAKE 1 TABLET BY MOUTH DAILY. 30 tablet 5  . dexamethasone (DECADRON) 4 MG tablet Take 0.5 tablets (2 mg total) by mouth daily. 30 tablet 1  . fluocinonide cream (LIDEX) 1.22 %  Apply 1 application topically 2 (two) times daily as needed (for rash.).    Marland Kitchen gabapentin (NEURONTIN) 300 MG capsule Take 1 capsule (300 mg total) by mouth daily. 30 capsule 5  . ibuprofen (ADVIL,MOTRIN) 200 MG tablet Take 600 mg by mouth every 8 (eight) hours as needed for moderate pain (pain).     Marland Kitchen lidocaine-prilocaine (EMLA) cream Apply to affected area once (Patient taking differently: Apply 1 application topically daily as needed. For port) 30 g 3  . LORazepam (ATIVAN) 1 MG tablet Take 1 tablet (1 mg total) by mouth at bedtime. 30 tablet 0  . naproxen sodium (ANAPROX) 220 MG tablet Take 220-440 mg by mouth 2 (two) times daily as needed (for muscle aches.).     Marland Kitchen omeprazole (PRILOSEC) 20 MG capsule Take 1 capsule (20 mg total) by mouth daily. 90 capsule 6  . ondansetron (ZOFRAN) 4 MG tablet Take 1 tablet (4 mg total) by mouth every 8 (eight) hours as needed for nausea or vomiting. 30 tablet 2  . ondansetron (ZOFRAN) 8 MG tablet Take 1 tablet (8 mg total) by mouth 2 (two) times daily. Start the day after chemo for 2 days. Then take as needed for nausea or vomiting. 30 tablet 1  . prochlorperazine (COMPAZINE) 10 MG tablet Take 1 tablet (10 mg total) by mouth every 6 (six) hours as needed (Nausea or vomiting). 30 tablet 1  . promethazine (PHENERGAN) 25 MG tablet Take 1 tablet (25 mg total) by mouth every 6 (six) hours as needed for nausea. 30 tablet 1  . ranitidine (ZANTAC) 150 MG tablet TAKE 1 TABLET BY MOUTH TWO TIMES DAILY. 60 tablet 2  . scopolamine (TRANSDERM-SCOP, 1.5 MG,) 1 MG/3DAYS Place 1 patch (1.5 mg total) onto the skin every 3 (three) days. 10 patch 12  . SUMAtriptan (IMITREX) 50 MG tablet Take 1 tablet (50 mg total) by mouth every 2 (two) hours as needed for migraine. May repeat in 2 hours if headache persists or recurs. 10 tablet 0  . traMADol (ULTRAM) 50 MG tablet Take 1 tablet (50 mg total) by mouth 3 (three) times daily as needed. (Patient taking differently: Take 50 mg by mouth 3  (three) times daily as needed for moderate pain. ) 30 tablet 0  . UNABLE TO FIND Dispense compression sleeve 20-30 mm for this patient with history of breast cancer s/p surgery 1 each 0   No current facility-administered medications for this visit.    PHYSICAL EXAMINATION: ECOG PERFORMANCE STATUS: 2 - Symptomatic, <50% confined to bed  Filed Vitals:   08/04/15 1518  BP: 120/84  Pulse: 93  Temp: 98.4 F (36.9 C)  Resp: 18   Filed Weights   08/04/15 1518  Weight: 217 lb 8 oz (98.657 kg)    GENERAL:alert, no distress and comfortable SKIN: skin color, texture, turgor are normal, no rashes or significant lesions EYES: normal, Conjunctiva are pink and non-injected, sclera clear OROPHARYNX:no exudate, no erythema and lips, buccal mucosa, and tongue normal  NECK: supple, thyroid normal size, non-tender, without nodularity LYMPH:  no palpable lymphadenopathy in the cervical, axillary or inguinal LUNGS: Diminished breath sounds at both lung bases especially the left greater than right HEART: regular rate & rhythm and no murmurs and no lower extremity edema ABDOMEN:abdomen  soft, non-tender and normal bowel sounds Musculoskeletal:no cyanosis of digits and no clubbing  NEURO: alert & oriented x 3 with fluent speech, no focal motor/sensory deficits  LABORATORY DATA:  I have reviewed the data as listed   Chemistry      Component Value Date/Time   NA 141 07/31/2015 1929   NA 143 07/27/2015 0827   K 3.1* 07/31/2015 1929   K 3.7 07/27/2015 0827   CL 106 07/31/2015 1929   CL 108* 07/30/2012 1430   CO2 24 07/31/2015 1929   CO2 25 07/27/2015 0827   BUN 8 07/31/2015 1929   BUN 8.7 07/27/2015 0827   CREATININE 0.73 07/31/2015 1929   CREATININE 0.8 07/27/2015 0827      Component Value Date/Time   CALCIUM 9.4 07/31/2015 1929   CALCIUM 9.1 07/27/2015 0827   ALKPHOS 90 07/31/2015 1929   ALKPHOS 67 07/27/2015 0827   AST 31 07/31/2015 1929   AST 24 07/27/2015 0827   ALT 36 07/31/2015  1929   ALT 50 07/27/2015 0827   BILITOT 0.8 07/31/2015 1929   BILITOT 0.67 07/27/2015 0827       Lab Results  Component Value Date   WBC 14.0* 07/31/2015   HGB 12.1 07/31/2015   HCT 35.5* 07/31/2015   MCV 88.8 07/31/2015   PLT 181 07/31/2015   NEUTROABS 10.4* 07/31/2015    ASSESSMENT & PLAN:  Breast cancer metastasized to bone Right axillary lymph node biopsy 06/18/2015: 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 BRCA 2 mutation underwent salpingo-oophorectomy Status post bilateral mastectomies and radiation to left chest wall and axilla currently on tamoxifen since October 2012) ------------------------------------------------------------------------------------------------------------------------------------------------------- Treatment plan: Halaven day 1 and day 8 every 3 weeks 6 cycles following which we will consider starting letrozole with Ibrance Current treatment:cycle 2 day 16 Halaven Halaven toxicities: 1. Nausea and dry heaves: In spite of changing antiemetics regimen to Aloxi, patient continues to have persistent nausea and vomiting. We added Emend and oral dexamethasone which completely resolved her nausea. 2. Cough without expectoration 3. Elevation of AST and ALT: We will continue to watch and monitor this. 4. We consulted palliative care. 5. Thrombocytopenia: Improved with platelet count of 274 on 07/27/2015 6. Profound decreased appetite: encourage the patient to continue to eat good nutritious  diet.  Profound shortness of breath: Patient is here today in our clinic mainly because of severe shortness of breath. We gave her oxygen and her breathing improved significantly. On examination I do not hear any wheezes or crackles diminished breath sounds in the lung bases especially the left lung base. She does not have any fevers or signs or symptoms of pneumonia. So we would like to order home oxygen for her. I suspect the cause of shortness of breath is because of metastatic cancer in the lungs. I would like to order CT of chest abdomen and pelvis for restaging for next Monday to assess response to treatment. As well as to evaluate the cause of her shortness of breath.  She will need bisphosphonate therapy for suspicious bone metastases.  Return to  clinic as previously scheduled for cycle 3 day 1   Orders Placed This Encounter  Procedures  . CT Abdomen Pelvis W Contrast    Standing Status: Future     Number of Occurrences:      Standing Expiration Date: 08/03/2016    Order Specific Question:  Reason for Exam (SYMPTOM  OR DIAGNOSIS REQUIRED)    Answer:  Metastatic breast cancer restaging    Order Specific Question:  Is the patient pregnant?    Answer:  No    Order Specific Question:  Preferred imaging location?    Answer:  [facility]  . CT Chest W Contrast    Standing Status: Future     Number of Occurrences:      Standing Expiration Date: 08/03/2016    Order Specific Question:  Reason for Exam (SYMPTOM  OR DIAGNOSIS REQUIRED)    Answer:  Metastatic breast cancer restaging    Order Specific Question:  Is the patient pregnant?    Answer:  No    Order Specific Question:  Preferred imaging location?    Answer:  [facility]   The patient has a good understanding of the overall plan. she agrees with it. she will call with any problems that may develop before the next visit here.   Rulon Eisenmenger, MD

## 2015-08-05 ENCOUNTER — Telehealth: Payer: Self-pay | Admitting: *Deleted

## 2015-08-05 NOTE — Telephone Encounter (Signed)
"  Daneil Dan with [employer] need a call from Karna Christmas about this patient."

## 2015-08-06 ENCOUNTER — Other Ambulatory Visit: Payer: Self-pay

## 2015-08-06 ENCOUNTER — Other Ambulatory Visit: Payer: Self-pay | Admitting: Hematology and Oncology

## 2015-08-06 DIAGNOSIS — C7951 Secondary malignant neoplasm of bone: Principal | ICD-10-CM

## 2015-08-06 DIAGNOSIS — C50911 Malignant neoplasm of unspecified site of right female breast: Secondary | ICD-10-CM

## 2015-08-06 DIAGNOSIS — R0602 Shortness of breath: Secondary | ICD-10-CM

## 2015-08-06 NOTE — Progress Notes (Signed)
Order placed.  Message sent notifying Rodena Piety.        Overnight Oximetry  Received: [place], RN Cc: Emeline Darling - thank you for the Oxygen Rx we received on the above Pt. However we do not have any Oxygen testing to qualify her so that her Insurance will pay for the Oxygen equipment. If you have any testing that we are not seeing please fax to [name].   If not, we can do an Overnight Oximetry test that will allow [name] to take an Oximeter to Pt to qualify. If so, please have Nicholas Lose, MD put in Order for an Overnight Oximetry and staff message [name] back and we can pull the order from Epic.   If you have any questions you can call the office   Thank you   Sharrell Ku  [phone]

## 2015-08-09 ENCOUNTER — Encounter (HOSPITAL_COMMUNITY): Payer: Self-pay

## 2015-08-09 ENCOUNTER — Ambulatory Visit (HOSPITAL_COMMUNITY)
Admission: RE | Admit: 2015-08-09 | Discharge: 2015-08-09 | Disposition: A | Discharge status: Home / Self Care | Payer: Medicare Other | Source: Ambulatory Visit | Attending: Hematology and Oncology | Admitting: Hematology and Oncology

## 2015-08-09 DIAGNOSIS — D259 Leiomyoma of uterus, unspecified: Secondary | ICD-10-CM | POA: Insufficient documentation

## 2015-08-09 DIAGNOSIS — K760 Fatty (change of) liver, not elsewhere classified: Secondary | ICD-10-CM | POA: Diagnosis not present

## 2015-08-09 DIAGNOSIS — C778 Secondary and unspecified malignant neoplasm of lymph nodes of multiple regions: Secondary | ICD-10-CM | POA: Insufficient documentation

## 2015-08-09 DIAGNOSIS — E042 Nontoxic multinodular goiter: Secondary | ICD-10-CM | POA: Insufficient documentation

## 2015-08-09 DIAGNOSIS — R0602 Shortness of breath: Secondary | ICD-10-CM | POA: Diagnosis not present

## 2015-08-09 DIAGNOSIS — J90 Pleural effusion, not elsewhere classified: Secondary | ICD-10-CM | POA: Diagnosis not present

## 2015-08-09 DIAGNOSIS — K449 Diaphragmatic hernia without obstruction or gangrene: Secondary | ICD-10-CM | POA: Diagnosis not present

## 2015-08-09 DIAGNOSIS — R079 Chest pain, unspecified: Secondary | ICD-10-CM | POA: Diagnosis not present

## 2015-08-09 DIAGNOSIS — J439 Emphysema, unspecified: Secondary | ICD-10-CM | POA: Diagnosis not present

## 2015-08-09 DIAGNOSIS — Z9013 Acquired absence of bilateral breasts and nipples: Secondary | ICD-10-CM | POA: Diagnosis not present

## 2015-08-09 DIAGNOSIS — Z923 Personal history of irradiation: Secondary | ICD-10-CM | POA: Diagnosis not present

## 2015-08-09 DIAGNOSIS — R05 Cough: Secondary | ICD-10-CM | POA: Insufficient documentation

## 2015-08-09 DIAGNOSIS — C7951 Secondary malignant neoplasm of bone: Secondary | ICD-10-CM | POA: Insufficient documentation

## 2015-08-09 DIAGNOSIS — I700 Atherosclerosis of aorta: Secondary | ICD-10-CM | POA: Diagnosis not present

## 2015-08-09 DIAGNOSIS — C7989 Secondary malignant neoplasm of other specified sites: Secondary | ICD-10-CM | POA: Diagnosis not present

## 2015-08-09 DIAGNOSIS — R110 Nausea: Secondary | ICD-10-CM | POA: Insufficient documentation

## 2015-08-09 DIAGNOSIS — C50911 Malignant neoplasm of unspecified site of right female breast: Secondary | ICD-10-CM | POA: Diagnosis not present

## 2015-08-09 MED ORDER — IOHEXOL 300 MG/ML  SOLN
100.0000 mL | Freq: Once | INTRAMUSCULAR | Status: AC | PRN
  Administered 2015-08-09: 100 mL via INTRAVENOUS

## 2015-08-09 MED ORDER — IOHEXOL 300 MG/ML  SOLN
50.0000 mL | Freq: Once | INTRAMUSCULAR | Status: AC | PRN
  Administered 2015-08-09: 50 mL via ORAL

## 2015-08-09 NOTE — Assessment & Plan Note (Signed)
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 BRCA 2 mutation underwent salpingo-oophorectomy Status post bilateral mastectomies and radiation to left chest wall and axilla currently on tamoxifen since October 2012) ------------------------------------------------------------------------------------------------------------------------------------------------------- Treatment plan: Halaven day 1 and day 8 every 3 weeks 6 cycles following which we will consider starting letrozole with Ibrance Current treatment:cycle 3 day 1 Halaven Halaven toxicities: 1. Nausea and dry heaves: In spite of changing antiemetics regimen to Aloxi, patient continues to have persistent nausea and vomiting. We added Emend and oral dexamethasone which completely resolved her nausea. 2. Cough without expectoration 3. Elevation of AST and ALT: We will continue to watch and monitor this. 4. We consulted palliative care. 5. Thrombocytopenia: Improved with platelet count of 274 on 07/27/2015 6. Profound decreased appetite: encourage the patient to continue to eat good nutritious diet.  Profound shortness of breath: We would like to order home oxygen for her. Radiology review: Decrease in thoracic lymphadenopathy, Decrease in Left upper lobe mass 2.5 cm x 3.7 cm to 1.5 cm x 3.4 cm; decrease in upper abdominal LN, sclerotic lesion L4  She will need bisphosphonate therapy for suspicious bone metastases.   Return to clinic in 3 weeks  for cycle 4.

## 2015-08-10 ENCOUNTER — Encounter: Payer: Self-pay | Admitting: Hematology and Oncology

## 2015-08-10 ENCOUNTER — Ambulatory Visit (HOSPITAL_BASED_OUTPATIENT_CLINIC_OR_DEPARTMENT_OTHER): Payer: Medicare Other | Admitting: Hematology and Oncology

## 2015-08-10 ENCOUNTER — Other Ambulatory Visit (HOSPITAL_BASED_OUTPATIENT_CLINIC_OR_DEPARTMENT_OTHER): Payer: Medicare Other

## 2015-08-10 ENCOUNTER — Ambulatory Visit (HOSPITAL_BASED_OUTPATIENT_CLINIC_OR_DEPARTMENT_OTHER): Payer: Medicare Other

## 2015-08-10 ENCOUNTER — Telehealth: Payer: Self-pay | Admitting: Hematology and Oncology

## 2015-08-10 VITALS — BP 105/84 | HR 114 | Temp 97.8°F | Resp 18 | Ht 62.0 in | Wt 214.0 lb

## 2015-08-10 DIAGNOSIS — C7951 Secondary malignant neoplasm of bone: Secondary | ICD-10-CM

## 2015-08-10 DIAGNOSIS — R112 Nausea with vomiting, unspecified: Secondary | ICD-10-CM

## 2015-08-10 DIAGNOSIS — C50911 Malignant neoplasm of unspecified site of right female breast: Secondary | ICD-10-CM

## 2015-08-10 DIAGNOSIS — T451X5A Adverse effect of antineoplastic and immunosuppressive drugs, initial encounter: Secondary | ICD-10-CM

## 2015-08-10 DIAGNOSIS — R0600 Dyspnea, unspecified: Secondary | ICD-10-CM

## 2015-08-10 DIAGNOSIS — C773 Secondary and unspecified malignant neoplasm of axilla and upper limb lymph nodes: Secondary | ICD-10-CM

## 2015-08-10 DIAGNOSIS — R0689 Other abnormalities of breathing: Secondary | ICD-10-CM | POA: Diagnosis not present

## 2015-08-10 DIAGNOSIS — C50912 Malignant neoplasm of unspecified site of left female breast: Secondary | ICD-10-CM | POA: Diagnosis present

## 2015-08-10 DIAGNOSIS — C50919 Malignant neoplasm of unspecified site of unspecified female breast: Secondary | ICD-10-CM

## 2015-08-10 DIAGNOSIS — R110 Nausea: Secondary | ICD-10-CM

## 2015-08-10 DIAGNOSIS — R0602 Shortness of breath: Secondary | ICD-10-CM | POA: Diagnosis not present

## 2015-08-10 LAB — COMPREHENSIVE METABOLIC PANEL (CC13)
ALT: 30 U/L (ref 0–55)
AST: 21 U/L (ref 5–34)
Albumin: 3.9 g/dL (ref 3.5–5.0)
Alkaline Phosphatase: 90 U/L (ref 40–150)
Anion Gap: 10 mEq/L (ref 3–11)
BUN: 6.1 mg/dL — ABNORMAL LOW (ref 7.0–26.0)
CHLORIDE: 109 meq/L (ref 98–109)
CO2: 23 meq/L (ref 22–29)
Calcium: 9.2 mg/dL (ref 8.4–10.4)
Creatinine: 0.8 mg/dL (ref 0.6–1.1)
EGFR: 89 mL/min/{1.73_m2} — AB (ref >90)
GLUCOSE: 117 mg/dL (ref 70–140)
POTASSIUM: 3.9 meq/L (ref 3.5–5.1)
SODIUM: 142 meq/L (ref 136–145)
TOTAL PROTEIN: 6.5 g/dL (ref 6.4–8.3)
Total Bilirubin: 0.62 mg/dL (ref 0.20–1.20)

## 2015-08-10 LAB — CBC WITH DIFFERENTIAL/PLATELET
BASO%: 0.3 % (ref 0.0–2.0)
BASOS ABS: 0 10*3/uL (ref 0.0–0.1)
EOS%: 0 % (ref 0.0–7.0)
Eosinophils Absolute: 0 10*3/uL (ref 0.0–0.5)
HCT: 37 % (ref 34.8–46.6)
HGB: 12.8 g/dL (ref 11.6–15.9)
LYMPH%: 17.7 % (ref 14.0–49.7)
MCH: 30.8 pg (ref 25.1–34.0)
MCHC: 34.6 g/dL (ref 31.5–36.0)
MCV: 88.9 fL (ref 79.5–101.0)
MONO#: 0.3 10*3/uL (ref 0.1–0.9)
MONO%: 4.3 % (ref 0.0–14.0)
NEUT#: 4.9 10*3/uL (ref 1.5–6.5)
NEUT%: 77.7 % — ABNORMAL HIGH (ref 38.4–76.8)
NRBC: 0 % (ref 0–0)
Platelets: 98 10*3/uL — ABNORMAL LOW (ref 145–400)
RBC: 4.16 10*6/uL (ref 3.70–5.45)
RDW: 16.1 % — AB (ref 11.2–14.5)
WBC: 6.3 10*3/uL (ref 3.9–10.3)
lymph#: 1.1 10*3/uL (ref 0.9–3.3)

## 2015-08-10 MED ORDER — SODIUM CHLORIDE 0.9 % IV SOLN
Freq: Once | INTRAVENOUS | Status: AC
  Administered 2015-08-10: 10:00:00 via INTRAVENOUS

## 2015-08-10 MED ORDER — PALONOSETRON HCL INJECTION 0.25 MG/5ML
INTRAVENOUS | Status: AC
  Filled 2015-08-10: qty 5

## 2015-08-10 MED ORDER — HEPARIN SOD (PORK) LOCK FLUSH 100 UNIT/ML IV SOLN
250.0000 [IU] | Freq: Once | INTRAVENOUS | Status: DC | PRN
  Filled 2015-08-10: qty 5

## 2015-08-10 MED ORDER — ALTEPLASE 2 MG IJ SOLR
2.0000 mg | Freq: Once | INTRAMUSCULAR | Status: DC | PRN
  Filled 2015-08-10: qty 2

## 2015-08-10 MED ORDER — SODIUM CHLORIDE 0.9 % IJ SOLN
10.0000 mL | INTRAMUSCULAR | Status: DC | PRN
  Administered 2015-08-10: 10 mL
  Filled 2015-08-10: qty 10

## 2015-08-10 MED ORDER — CLINDAMYCIN HCL 150 MG PO CAPS: 150.0000 mg | ORAL_CAPSULE | Freq: Three times a day (TID) | ORAL | Status: DC

## 2015-08-10 MED ORDER — SODIUM CHLORIDE 0.9 % IV SOLN
Freq: Once | INTRAVENOUS | Status: AC
  Administered 2015-08-10: 09:00:00 via INTRAVENOUS

## 2015-08-10 MED ORDER — SODIUM CHLORIDE 0.9 % IJ SOLN
10.0000 mL | INTRAMUSCULAR | Status: DC | PRN
  Filled 2015-08-10: qty 10

## 2015-08-10 MED ORDER — SODIUM CHLORIDE 0.9 % IV SOLN
Freq: Once | INTRAVENOUS | Status: AC
  Administered 2015-08-10: 10:00:00 via INTRAVENOUS
  Filled 2015-08-10: qty 5

## 2015-08-10 MED ORDER — SODIUM CHLORIDE 0.9 % IV SOLN
1.4100 mg/m2 | Freq: Once | INTRAVENOUS | Status: AC
  Administered 2015-08-10: 3 mg via INTRAVENOUS
  Filled 2015-08-10: qty 6

## 2015-08-10 MED ORDER — HEPARIN SOD (PORK) LOCK FLUSH 100 UNIT/ML IV SOLN
500.0000 [IU] | Freq: Once | INTRAVENOUS | Status: DC | PRN
  Filled 2015-08-10: qty 5

## 2015-08-10 MED ORDER — PALONOSETRON HCL INJECTION 0.25 MG/5ML
0.2500 mg | Freq: Once | INTRAVENOUS | Status: AC
  Administered 2015-08-10: 0.25 mg via INTRAVENOUS

## 2015-08-10 NOTE — Progress Notes (Signed)
Patient Care Team: Mackie Pai, PA-C as PCP - General (Physician Assistant) Thea Silversmith, MD as Consulting Physician (Radiation Oncology) Consuela Mimes, MD as Consulting Physician (Hematology and Oncology) Nicholas Lose, MD as Consulting Physician (Hematology and Oncology)  DIAGNOSIS: Breast cancer metastasized to bone   Staging form: Breast, AJCC 7th Edition     Pathologic: Stage IIIC (T4d, N3, cM0) - Signed by [name], MD on 01/19/2014   SUMMARY OF ONCOLOGIC HISTORY:   Breast cancer metastasized to bone   12/07/2010 - 04/12/2011 Neo-Adjuvant Chemotherapy Neoadjuvant FEC x6 followed by Taxotere x4   12/26/2010 Procedure Genetic testing showed mutation for BRCA2 [id number]   05/12/2011 Surgery Bilateral mastectomy with left axillary dissection 11/21 positive lymph nodes 5.8 cm tumor ER 95% PR 0% Ki-67 61% HER-2 negative ratio 1.39 T3, N3, 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   07/03/2011 - 08/17/2011 Radiation Therapy Radiation therapy to the chest and axilla   09/11/2011 -  Anti-estrogen oral therapy Tamoxifen later switched to Arimidex 07/2012   04/22/2012 Surgery Bilateral salpingo-oophorectomy   06/10/2015 Imaging Bone scan: uptake right lateral frontal bone, left malar region, manubrium, right 6,8 ribs; CT-CAP: New extensive infiltrate left & right hilar/mediastinal, right axillary, bil lower neck LN, 3.7 cm LUL consolidation, RML nodules,Lt Pl eff, RPLN   06/18/2015 Initial Biopsy Right axillary lymph node biopsy: 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  She underwent CT chest abdomen pelvis after 2 rounds of  chemotherapy and is here today to discuss the results. She is complaining of shortness of breath even to minimal exertion and at rest. I prescribed her home oxygen and there conducting an evaluation for her to see if she qualifies for it. She also complains of scalp folliculitis which is very painful and uncomfortable on the scalp.  REVIEW OF SYSTEMS:   Constitutional: Denies fevers, chills or abnormal weight loss Eyes: Denies blurriness of vision Ears, nose, mouth, throat, and face: Denies mucositis or sore throat , scalp folliculitis Respiratory:  Shortness of breath even to minimal exertion and at rest Cardiovascular: Denies palpitation, chest discomfort or lower extremity swelling Gastrointestinal:  Denies nausea, heartburn or change in bowel habits Skin: Denies abnormal skin rashes Lymphatics: Denies new lymphadenopathy or easy bruising Neurological:Denies numbness, tingling or new weaknesses Behavioral/Psych: Mood is stable, no new changes  Breast:  denies any pain or lumps or nodules in either breasts All other systems were reviewed with the patient and are negative.  I have reviewed the past medical history, past surgical history, social history and family history with the patient and they are unchanged from previous note.  ALLERGIES:  is allergic to acyclovir and related and doxycycline hydrochloride.  MEDICATIONS:  Current Outpatient Prescriptions  Medication Sig Dispense Refill  . ALPRAZolam (XANAX) 0.5 MG tablet Take 1 tablet (0.5 mg total) by mouth at bedtime as needed for sleep. 30 tablet 0  . anastrozole (ARIMIDEX) 1 MG tablet Take 1 tablet (1 mg total) by mouth daily. 90 tablet 6  . Calcium 200 MG TABS Take 200 mg by mouth daily.     . cholecalciferol (VITAMIN D) 1000 UNITS tablet Take  1,000 Units by mouth daily.     . citalopram (CELEXA) 40 MG tablet TAKE 1 TABLET BY MOUTH DAILY. 30 tablet 5  . dexamethasone (DECADRON) 4 MG tablet Take 0.5 tablets (2 mg total) by mouth  daily. 30 tablet 1  . fluocinonide cream (LIDEX) 6.64 % Apply 1 application topically 2 (two) times daily as needed (for rash.).    Marland Kitchen gabapentin (NEURONTIN) 300 MG capsule Take 1 capsule (300 mg total) by mouth daily. 30 capsule 5  . ibuprofen (ADVIL,MOTRIN) 200 MG tablet Take 600 mg by mouth every 8 (eight) hours as needed for moderate pain (pain).     Marland Kitchen lidocaine-prilocaine (EMLA) cream Apply to affected area once (Patient taking differently: Apply 1 application topically daily as needed. For port) 30 g 3  . LORazepam (ATIVAN) 1 MG tablet Take 1 tablet (1 mg total) by mouth at bedtime. 30 tablet 0  . naproxen sodium (ANAPROX) 220 MG tablet Take 220-440 mg by mouth 2 (two) times daily as needed (for muscle aches.).     Marland Kitchen omeprazole (PRILOSEC) 20 MG capsule Take 1 capsule (20 mg total) by mouth daily. 90 capsule 6  . ondansetron (ZOFRAN) 4 MG tablet Take 1 tablet (4 mg total) by mouth every 8 (eight) hours as needed for nausea or vomiting. 30 tablet 2  . ondansetron (ZOFRAN) 8 MG tablet Take 1 tablet (8 mg total) by mouth 2 (two) times daily. Start the day after chemo for 2 days. Then take as needed for nausea or vomiting. 30 tablet 1  . prochlorperazine (COMPAZINE) 10 MG tablet Take 1 tablet (10 mg total) by mouth every 6 (six) hours as needed (Nausea or vomiting). 30 tablet 1  . promethazine (PHENERGAN) 25 MG tablet Take 1 tablet (25 mg total) by mouth every 6 (six) hours as needed for nausea. 30 tablet 1  . ranitidine (ZANTAC) 150 MG tablet TAKE 1 TABLET BY MOUTH TWO TIMES DAILY. 60 tablet 2  . scopolamine (TRANSDERM-SCOP, 1.5 MG,) 1 MG/3DAYS Place 1 patch (1.5 mg total) onto the skin every 3 (three) days. 10 patch 12  . SUMAtriptan (IMITREX) 50 MG tablet Take 1 tablet (50 mg total) by mouth every 2 (two) hours as needed for migraine. May repeat in 2 hours if headache persists or recurs. 10 tablet 0  . traMADol (ULTRAM) 50 MG tablet Take 1 tablet (50 mg total) by mouth 3 (three) times daily as  needed. (Patient taking differently: Take 50 mg by mouth 3 (three) times daily as needed for moderate pain. ) 30 tablet 0  . UNABLE TO FIND Dispense compression sleeve 20-30 mm for this patient with history of breast cancer s/p surgery 1 each 0  . clindamycin (CLEOCIN) 150 MG capsule Take 1 capsule (150 mg total) by mouth 3 (three) times daily. 21 capsule 0   No current facility-administered medications for this visit.   Facility-Administered Medications Ordered in Other Visits  Medication Dose Route Frequency Provider Last Rate Last Dose  . alteplase (CATHFLO ACTIVASE) injection 2 mg  2 mg Intracatheter Once PRN Nicholas Lose, MD      . heparin lock flush 100 unit/mL  500 Units Intracatheter Once PRN Nicholas Lose, MD      . heparin lock flush 100 unit/mL  500 Units Intracatheter Once PRN Nicholas Lose, MD      . heparin lock flush 100 unit/mL  250 Units Intracatheter Once PRN Nicholas Lose, MD      . sodium chloride 0.9 % injection 10 mL  10 mL Intracatheter PRN Nicholas Lose, MD      . sodium chloride 0.9 % injection 10 mL  10 mL Intracatheter PRN Nicholas Lose, MD   10 mL at 08/10/15 1048    PHYSICAL EXAMINATION: ECOG PERFORMANCE STATUS: 2 - Symptomatic, <50% confined to bed  Filed Vitals:   08/10/15 0841  BP: 105/84  Pulse: 114  Temp: 97.8 F (36.6 C)  Resp: 18   Filed Weights   08/10/15 0841  Weight: 214 lb (97.07 kg)    GENERAL:alert, no distress and comfortable SKIN: skin color, texture, turgor are normal, no rashes or significant lesions EYES: normal, Conjunctiva are pink and non-injected, sclera clear OROPHARYNX:no exudate, no erythema and lips, buccal mucosa, and tongue normal  NECK: supple, thyroid normal size, non-tender, without nodularity LYMPH:  no palpable lymphadenopathy in the cervical, axillary or inguinal LUNGS:  Diminished breath sounds at the bases and fine crackles HEART: regular rate & rhythm and no murmurs and no lower extremity edema ABDOMEN:abdomen soft,  non-tender and normal bowel sounds Musculoskeletal:no cyanosis of digits and no clubbing  NEURO: alert & oriented x 3 with fluent speech, no focal motor/sensory deficits  LABORATORY DATA:  I have reviewed the data as listed   Chemistry      Component Value Date/Time   NA 142 08/10/2015 0820   NA 141 07/31/2015 1929   K 3.9 08/10/2015 0820   K 3.1* 07/31/2015 1929   CL 106 07/31/2015 1929   CL 108* 07/30/2012 1430   CO2 23 08/10/2015 0820   CO2 24 07/31/2015 1929   BUN 6.1* 08/10/2015 0820   BUN 8 07/31/2015 1929   CREATININE 0.8 08/10/2015 0820   CREATININE 0.73 07/31/2015 1929      Component Value Date/Time   CALCIUM 9.2 08/10/2015 0820   CALCIUM 9.4 07/31/2015 1929   ALKPHOS 90 08/10/2015 0820   ALKPHOS 90 07/31/2015 1929   AST 21 08/10/2015 0820   AST 31 07/31/2015 1929   ALT 30 08/10/2015 0820   ALT 36 07/31/2015 1929   BILITOT 0.62 08/10/2015 0820   BILITOT 0.8 07/31/2015 1929       Lab Results  Component Value Date   WBC 6.3 08/10/2015   HGB 12.8 08/10/2015   HCT 37.0 08/10/2015   MCV 88.9 08/10/2015   PLT 98* 08/10/2015   NEUTROABS 4.9 08/10/2015     RADIOGRAPHIC STUDIES: I have personally reviewed the radiology reports and agreed with their findings. Ct Chest W Contrast  08/09/2015   CLINICAL DATA:  Left breast cancer diagnosed in 2012, status post recurrence in July 2016, with bone metastases, chemotherapy in progress, status post bilateral mastectomy. XRT complete. Chest pain, cough, and shortness of breath. Nausea.  EXAM: CT CHEST, ABDOMEN, AND PELVIS WITH CONTRAST  TECHNIQUE: Multidetector CT imaging of the chest, abdomen and pelvis was performed following the standard protocol during bolus administration of intravenous contrast.  CONTRAST:  187mL OMNIPAQUE IOHEXOL 300 MG/ML  SOLN  COMPARISON:  06/10/2015  FINDINGS: CT CHEST FINDINGS  Mediastinum/Nodes: The heart is normal in size. Trace pericardial fluid/ thickening.  Atherosclerotic calcifications of  the aortic arch.  Right chest port terminates at the cavoatrial junction.  Improving thoracic lymphadenopathy, including:  --10 mm short axis right paratracheal node (series 2/ image 14), previously 11 mm  --10 mm short axis prevascular node (series 2/image 15), previously 18 mm  --9 mm short axis left hilar node (series 2/ image 21), previously 15 mm  --10 mm short axis subcarinal node (series  2/ image 22), previously 14 mm  Status post bilateral mastectomy with left axillary lymph node dissection.  Bilateral thyroid nodules with coarse calcifications, grossly unchanged.  Lungs/Pleura: Residual 1.5 x 3.4 cm opacity in the anterior left upper lobe (series 4/ image 13), previously 2.5 x 3.7 cm.  Mild patchy nodularity in the medial left lower lobe (series 4/ image 38).  Mild subpleural reticulation/atelectasis in the posterior right lower lobe (series 4/image 29).  Trace left pleural effusion.  Underlying mild emphysematous changes.  No focal consolidation.  No pneumothorax.  Musculoskeletal: Mild mottled sclerosis in the sternum (series 2/ images 13 and 17), nonspecific.  Mild degenerative changes involving the thoracic spine.  CT ABDOMEN PELVIS FINDINGS  Hepatobiliary: Hepatic steatosis with focal fatty sparing along the gallbladder fossa (series 2/image 52). Focal fat/ altered perfusion along the falciform ligament. No suspicious/enhancing hepatic lesions.  Gallbladder is unremarkable. No intrahepatic or extrahepatic ductal dilatation.  Pancreas: Within normal limits.  Spleen: Within normal limits.  Adrenals/Urinary Tract: Adrenal glands are within normal limits.  Kidneys are within normal limits.  No hydronephrosis.  Bladder is within normal limits.  Stomach/Bowel: Stomach is notable for a tiny hiatal hernia.  No evidence of bowel obstruction.  Normal appendix.  Vascular/Lymphatic: No evidence of abdominal aortic aneurysm.  Small upper abdominal lymph nodes, including:  --11 mm short axis periportal node (series  2/image 48), previously 14 mm  --13 mm short axis portacaval node (series 2/image 54), previously 18 mm  --9 mm short axis peripancreatic node (series 2/ image 50), previously 14 mm  Reproductive: Visualized thyroid is notable for multiple uterine fibroids, including a 3.0 cm calcified right fundal fibroid.  No adnexal masses.  Other: No abdominopelvic ascites.  Musculoskeletal: 2.6 cm sclerotic lesion involving the left superior endplate of the L4 vertebral body (series 2/image 78), new, possibly reflecting osseous metastasis. Given its location at the endplate, reactive sclerosis related to evaluate new/developing Schmorl's node is also possible.  Mild degenerative changes involving the lumbar spine.  IMPRESSION: Status post bilateral mastectomy with left axillary lymph node dissection.  Improving thoracic and upper abdominal nodal metastases, as above.  New sclerotic lesion involving the left superior endplate of L4, possibly reflecting osseous metastasis versus Schmorl's node.  These findings are compatible with response to therapy. (Please note that new sclerosis of a previously inconspicuous osseous lesion, by itself, would not indicate progression of disease.)   Electronically Signed   By: Julian Hy M.D.   On: 08/09/2015 15:53   Ct Abdomen Pelvis W Contrast  08/09/2015   CLINICAL DATA:  Left breast cancer diagnosed in 2012, status post recurrence in July 2016, with bone metastases, chemotherapy in progress, status post bilateral mastectomy. XRT complete. Chest pain, cough, and shortness of breath. Nausea.  EXAM: CT CHEST, ABDOMEN, AND PELVIS WITH CONTRAST  TECHNIQUE: Multidetector CT imaging of the chest, abdomen and pelvis was performed following the standard protocol during bolus administration of intravenous contrast.  CONTRAST:  151mL OMNIPAQUE IOHEXOL 300 MG/ML  SOLN  COMPARISON:  06/10/2015  FINDINGS: CT CHEST FINDINGS  Mediastinum/Nodes: The heart is normal in size. Trace pericardial fluid/  thickening.  Atherosclerotic calcifications of the aortic arch.  Right chest port terminates at the cavoatrial junction.  Improving thoracic lymphadenopathy, including:  --10 mm short axis right paratracheal node (series 2/ image 14), previously 11 mm  --10 mm short axis prevascular node (series 2/image 15), previously 18 mm  --9 mm short axis left hilar node (series 2/ image 21), previously 15  mm  --10 mm short axis subcarinal node (series 2/ image 22), previously 14 mm  Status post bilateral mastectomy with left axillary lymph node dissection.  Bilateral thyroid nodules with coarse calcifications, grossly unchanged.  Lungs/Pleura: Residual 1.5 x 3.4 cm opacity in the anterior left upper lobe (series 4/ image 13), previously 2.5 x 3.7 cm.  Mild patchy nodularity in the medial left lower lobe (series 4/ image 38).  Mild subpleural reticulation/atelectasis in the posterior right lower lobe (series 4/image 29).  Trace left pleural effusion.  Underlying mild emphysematous changes.  No focal consolidation.  No pneumothorax.  Musculoskeletal: Mild mottled sclerosis in the sternum (series 2/ images 13 and 17), nonspecific.  Mild degenerative changes involving the thoracic spine.  CT ABDOMEN PELVIS FINDINGS  Hepatobiliary: Hepatic steatosis with focal fatty sparing along the gallbladder fossa (series 2/image 52). Focal fat/ altered perfusion along the falciform ligament. No suspicious/enhancing hepatic lesions.  Gallbladder is unremarkable. No intrahepatic or extrahepatic ductal dilatation.  Pancreas: Within normal limits.  Spleen: Within normal limits.  Adrenals/Urinary Tract: Adrenal glands are within normal limits.  Kidneys are within normal limits.  No hydronephrosis.  Bladder is within normal limits.  Stomach/Bowel: Stomach is notable for a tiny hiatal hernia.  No evidence of bowel obstruction.  Normal appendix.  Vascular/Lymphatic: No evidence of abdominal aortic aneurysm.  Small upper abdominal lymph nodes,  including:  --11 mm short axis periportal node (series 2/image 48), previously 14 mm  --13 mm short axis portacaval node (series 2/image 54), previously 18 mm  --9 mm short axis peripancreatic node (series 2/ image 50), previously 14 mm  Reproductive: Visualized thyroid is notable for multiple uterine fibroids, including a 3.0 cm calcified right fundal fibroid.  No adnexal masses.  Other: No abdominopelvic ascites.  Musculoskeletal: 2.6 cm sclerotic lesion involving the left superior endplate of the L4 vertebral body (series 2/image 78), new, possibly reflecting osseous metastasis. Given its location at the endplate, reactive sclerosis related to evaluate new/developing Schmorl's node is also possible.  Mild degenerative changes involving the lumbar spine.  IMPRESSION: Status post bilateral mastectomy with left axillary lymph node dissection.  Improving thoracic and upper abdominal nodal metastases, as above.  New sclerotic lesion involving the left superior endplate of L4, possibly reflecting osseous metastasis versus Schmorl's node.  These findings are compatible with response to therapy. (Please note that new sclerosis of a previously inconspicuous osseous lesion, by itself, would not indicate progression of disease.)   Electronically Signed   By: Julian Hy M.D.   On: 08/09/2015 15:53     ASSESSMENT & PLAN:  Breast cancer metastasized to bone Right axillary lymph node biopsy 06/18/2015: 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 BRCA 2 mutation  underwent salpingo-oophorectomy Status post bilateral mastectomies and radiation to left chest wall and axilla currently on tamoxifen since October 2012) ------------------------------------------------------------------------------------------------------------------------------------------------------- Treatment plan: Halaven day 1 and day 8 every 3 weeks 6 cycles following which we will consider starting letrozole with Ibrance Current treatment:cycle 3 day 1 Halaven Halaven toxicities: 1. Nausea and dry heaves: In spite of  changing antiemetics regimen to [place]ox[place], patient continues to have persistent nausea and vomiting. We added Emend and oral dexamethasone which completely resolved her nausea. 2. Cough without expectoration 3. Elevation of AST and ALT: We will continue to watch and monitor this. 4. We consulted palliative care. 5. Thrombocytopenia: Improved with platelet count of 274 on 07/27/2015 6. Profound decreased appetite: encourage the patient to continue to eat good nutritious diet.  Profound shortness of breath: We would like to order home oxygen for her.  If she does not qualify for home oxygen, then she will use her C Pap machine during the daytime as well.  Radiology review: Decrease in thoracic lymphadenopathy, Decrease in Left upper lobe mass 2.5 cm x 3.7 cm to 1.5 cm x 3.4 cm; decrease in upper abdominal LN, sclerotic lesion L4   She will need bisphosphonate therapy for suspicious bone metastases.   Scalp folliculitis: I prescribed oral clindamycin 150 mg 3 times a day for a week. I discussed with her about side effects of clindamycin including diarrhea.  Return to clinic in 3 weeks for cycle 4.  No orders of the defined types were placed in this encounter.   The patient has a good understanding of the overall plan. she agrees with it. she will call with any problems that may develop before the next visit here.   Rulon Eisenmenger, MD

## 2015-08-10 NOTE — Telephone Encounter (Signed)
Gave avs & calendar for October. Sent message to confirm visits on 10/04

## 2015-08-10 NOTE — Patient Instructions (Signed)
[facility] Discharge Instructions for Patients Receiving Chemotherapy  Today you received the following chemotherapy agents Halaven  To help prevent nausea and vomiting after your treatment, we encourage you to take your nausea medication as needed   If you develop nausea and vomiting that is not controlled by your nausea medication, call the clinic.   BELOW ARE SYMPTOMS THAT SHOULD BE REPORTED IMMEDIATELY:  *FEVER GREATER THAN 100.5 F  *CHILLS WITH OR WITHOUT FEVER  NAUSEA AND VOMITING THAT IS NOT CONTROLLED WITH YOUR NAUSEA MEDICATION  *UNUSUAL SHORTNESS OF BREATH  *UNUSUAL BRUISING OR BLEEDING  TENDERNESS IN MOUTH AND THROAT WITH OR WITHOUT PRESENCE OF ULCERS  *URINARY PROBLEMS  *BOWEL PROBLEMS  UNUSUAL RASH Items with * indicate a potential emergency and should be followed up as soon as possible.  Feel free to call the clinic you have any questions or concerns. The clinic phone number is [phone].  Please show the CHEMO ALERT CARD at check-in to the Emergency Department and triage nurse.   

## 2015-08-11 ENCOUNTER — Encounter: Payer: Self-pay | Admitting: *Deleted

## 2015-08-11 NOTE — Progress Notes (Signed)
Faxed [facility] to [facility] for home O2.

## 2015-08-12 ENCOUNTER — Telehealth: Payer: Self-pay | Admitting: *Deleted

## 2015-08-12 NOTE — Telephone Encounter (Signed)
Renford Dills, RN at 08/12/2015 2:00 PM     Status: Signed       Expand All Collapse All   Patient called wanting to get portable O2 and she has been set up for O2 at night. Advised patient that testing in office earlier did not meet the criteria for Medicare to approve the O2. Advised patient that we could repeat the testing when she comes in next week. Advised patient that O2 saturation must be 88 or lower for Medicare to approve the O2. She verbalized understanding.

## 2015-08-12 NOTE — Telephone Encounter (Signed)
Patient called wanting to get portable O2 and she has been set up for O2 at night. Advised patient that testing in office earlier did not meet the criteria for Medicare to approve the O2. Advised patient that we could repeat the testing when she comes in next week. Advised patient that O2 saturation must be 88 or lower for Medicare to approve the O2. She verbalized understanding.

## 2015-08-16 NOTE — Assessment & Plan Note (Signed)
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 BRCA 2 mutation underwent salpingo-oophorectomy Status post bilateral mastectomies and radiation to left chest wall and axilla currently on tamoxifen since October 2012) ------------------------------------------------------------------------------------------------------------------------------------------------------- Treatment plan: Halaven day 1 and day 8 every 3 weeks 6 cycles following which we will consider starting letrozole with Ibrance Current treatment:cycle 3 day 8 Halaven Halaven toxicities: 1. Nausea and dry heaves: In spite of changing antiemetics regimen to Aloxi, patient continues to have persistent nausea and vomiting. We added Emend and oral dexamethasone which completely resolved her nausea. 2. Cough without expectoration 3. Elevation of AST and ALT: We will continue to watch and monitor this. 4. We consulted palliative care. 5. Thrombocytopenia: Improved with platelet count of 274 on 07/27/2015 6. Profound decreased appetite: encourage the patient to continue to eat good nutritious diet.  Profound shortness of breath: on Home O2 Radiology review: Decrease in thoracic lymphadenopathy, Decrease in Left upper lobe mass 2.5 cm x 3.7 cm to 1.5 cm x 3.4 cm; decrease in upper abdominal LN, sclerotic lesion L4   She will need bisphosphonate therapy for suspicious bone metastases.   Scalp folliculitis: I prescribed oral clindamycin 150 mg 3  times a day for a week.  Return to clinic in 2 weeks for cycle 4.

## 2015-08-17 ENCOUNTER — Ambulatory Visit (HOSPITAL_BASED_OUTPATIENT_CLINIC_OR_DEPARTMENT_OTHER): Payer: Medicare Other

## 2015-08-17 ENCOUNTER — Other Ambulatory Visit (HOSPITAL_BASED_OUTPATIENT_CLINIC_OR_DEPARTMENT_OTHER): Payer: Medicare Other

## 2015-08-17 ENCOUNTER — Telehealth: Payer: Self-pay | Admitting: Oncology

## 2015-08-17 ENCOUNTER — Ambulatory Visit (HOSPITAL_BASED_OUTPATIENT_CLINIC_OR_DEPARTMENT_OTHER): Payer: Medicare Other | Admitting: Hematology and Oncology

## 2015-08-17 ENCOUNTER — Encounter: Payer: Self-pay | Admitting: Hematology and Oncology

## 2015-08-17 ENCOUNTER — Telehealth: Payer: Self-pay | Admitting: Hematology and Oncology

## 2015-08-17 VITALS — BP 99/74 | HR 103 | Temp 98.4°F | Resp 18 | Ht 62.0 in | Wt 212.6 lb

## 2015-08-17 DIAGNOSIS — C50912 Malignant neoplasm of unspecified site of left female breast: Secondary | ICD-10-CM

## 2015-08-17 DIAGNOSIS — C773 Secondary and unspecified malignant neoplasm of axilla and upper limb lymph nodes: Secondary | ICD-10-CM

## 2015-08-17 DIAGNOSIS — R0602 Shortness of breath: Secondary | ICD-10-CM | POA: Diagnosis not present

## 2015-08-17 DIAGNOSIS — C50911 Malignant neoplasm of unspecified site of right female breast: Secondary | ICD-10-CM

## 2015-08-17 DIAGNOSIS — R05 Cough: Secondary | ICD-10-CM | POA: Diagnosis not present

## 2015-08-17 DIAGNOSIS — T451X5A Adverse effect of antineoplastic and immunosuppressive drugs, initial encounter: Secondary | ICD-10-CM

## 2015-08-17 DIAGNOSIS — C50919 Malignant neoplasm of unspecified site of unspecified female breast: Secondary | ICD-10-CM

## 2015-08-17 DIAGNOSIS — R112 Nausea with vomiting, unspecified: Secondary | ICD-10-CM

## 2015-08-17 DIAGNOSIS — R110 Nausea: Secondary | ICD-10-CM

## 2015-08-17 DIAGNOSIS — C7951 Secondary malignant neoplasm of bone: Principal | ICD-10-CM

## 2015-08-17 DIAGNOSIS — R630 Anorexia: Secondary | ICD-10-CM

## 2015-08-17 DIAGNOSIS — R740 Nonspecific elevation of levels of transaminase and lactic acid dehydrogenase [LDH]: Secondary | ICD-10-CM | POA: Diagnosis not present

## 2015-08-17 DIAGNOSIS — D696 Thrombocytopenia, unspecified: Secondary | ICD-10-CM

## 2015-08-17 LAB — CBC WITH DIFFERENTIAL/PLATELET
BASO%: 1.2 % (ref 0.0–2.0)
Basophils Absolute: 0 10*3/uL (ref 0.0–0.1)
EOS%: 0.2 % (ref 0.0–7.0)
Eosinophils Absolute: 0 10*3/uL (ref 0.0–0.5)
HCT: 33.6 % — ABNORMAL LOW (ref 34.8–46.6)
HGB: 11.7 g/dL (ref 11.6–15.9)
LYMPH%: 33.2 % (ref 14.0–49.7)
MCH: 30.9 pg (ref 25.1–34.0)
MCHC: 34.8 g/dL (ref 31.5–36.0)
MCV: 88.7 fL (ref 79.5–101.0)
MONO#: 0.2 10*3/uL (ref 0.1–0.9)
MONO%: 4.4 % (ref 0.0–14.0)
NEUT#: 2.3 10*3/uL (ref 1.5–6.5)
NEUT%: 61 % (ref 38.4–76.8)
PLATELETS: 206 10*3/uL (ref 145–400)
RBC: 3.79 10*6/uL (ref 3.70–5.45)
RDW: 16.9 % — ABNORMAL HIGH (ref 11.2–14.5)
WBC: 3.8 10*3/uL — ABNORMAL LOW (ref 3.9–10.3)
lymph#: 1.2 10*3/uL (ref 0.9–3.3)

## 2015-08-17 LAB — COMPREHENSIVE METABOLIC PANEL (CC13)
ALT: 48 U/L (ref 0–55)
ANION GAP: 8 meq/L (ref 3–11)
AST: 29 U/L (ref 5–34)
Albumin: 4 g/dL (ref 3.5–5.0)
Alkaline Phosphatase: 63 U/L (ref 40–150)
BUN: 8.6 mg/dL (ref 7.0–26.0)
CHLORIDE: 109 meq/L (ref 98–109)
CO2: 26 meq/L (ref 22–29)
CREATININE: 0.8 mg/dL (ref 0.6–1.1)
Calcium: 9.6 mg/dL (ref 8.4–10.4)
EGFR: 89 mL/min/{1.73_m2} — AB (ref >90)
Glucose: 105 mg/dl (ref 70–140)
Potassium: 3.7 mEq/L (ref 3.5–5.1)
Sodium: 144 mEq/L (ref 136–145)
Total Bilirubin: 0.67 mg/dL (ref 0.20–1.20)
Total Protein: 6.5 g/dL (ref 6.4–8.3)

## 2015-08-17 MED ORDER — HEPARIN SOD (PORK) LOCK FLUSH 100 UNIT/ML IV SOLN
500.0000 [IU] | Freq: Once | INTRAVENOUS | Status: AC | PRN
  Administered 2015-08-17: 500 [IU]
  Filled 2015-08-17: qty 5

## 2015-08-17 MED ORDER — SODIUM CHLORIDE 0.9 % IV SOLN
Freq: Once | INTRAVENOUS | Status: AC
  Administered 2015-08-17: 10:00:00 via INTRAVENOUS

## 2015-08-17 MED ORDER — SODIUM CHLORIDE 0.9 % IV SOLN
1.4100 mg/m2 | Freq: Once | INTRAVENOUS | Status: AC
  Administered 2015-08-17: 3 mg via INTRAVENOUS
  Filled 2015-08-17: qty 6

## 2015-08-17 MED ORDER — SODIUM CHLORIDE 0.9 % IJ SOLN
10.0000 mL | INTRAMUSCULAR | Status: DC | PRN
  Administered 2015-08-17: 10 mL
  Filled 2015-08-17: qty 10

## 2015-08-17 MED ORDER — PALONOSETRON HCL INJECTION 0.25 MG/5ML
0.2500 mg | Freq: Once | INTRAVENOUS | Status: AC
  Administered 2015-08-17: 0.25 mg via INTRAVENOUS

## 2015-08-17 MED ORDER — PEGFILGRASTIM 6 MG/0.6ML ~~LOC~~ PSKT
6.0000 mg | PREFILLED_SYRINGE | Freq: Once | SUBCUTANEOUS | Status: AC
  Administered 2015-08-17: 6 mg via SUBCUTANEOUS
  Filled 2015-08-17: qty 0.6

## 2015-08-17 MED ORDER — FOSAPREPITANT DIMEGLUMINE INJECTION 150 MG
Freq: Once | INTRAVENOUS | Status: AC
  Administered 2015-08-17: 10:00:00 via INTRAVENOUS
  Filled 2015-08-17: qty 5

## 2015-08-17 MED ORDER — PALONOSETRON HCL INJECTION 0.25 MG/5ML
INTRAVENOUS | Status: AC
  Filled 2015-08-17: qty 5

## 2015-08-17 NOTE — Patient Instructions (Signed)
[facility] Discharge Instructions for Patients Receiving Chemotherapy  Today you received the following chemotherapy agents Halaven  To help prevent nausea and vomiting after your treatment, we encourage you to take your nausea medication as needed   If you develop nausea and vomiting that is not controlled by your nausea medication, call the clinic.   BELOW ARE SYMPTOMS THAT SHOULD BE REPORTED IMMEDIATELY:  *FEVER GREATER THAN 100.5 F  *CHILLS WITH OR WITHOUT FEVER  NAUSEA AND VOMITING THAT IS NOT CONTROLLED WITH YOUR NAUSEA MEDICATION  *UNUSUAL SHORTNESS OF BREATH  *UNUSUAL BRUISING OR BLEEDING  TENDERNESS IN MOUTH AND THROAT WITH OR WITHOUT PRESENCE OF ULCERS  *URINARY PROBLEMS  *BOWEL PROBLEMS  UNUSUAL RASH Items with * indicate a potential emergency and should be followed up as soon as possible.  Feel free to call the clinic you have any questions or concerns. The clinic phone number is [phone].  Please show the CHEMO ALERT CARD at check-in to the Emergency Department and triage nurse.   

## 2015-08-17 NOTE — Telephone Encounter (Signed)
Called and spoke with patient and she is aware of her echo appointment °

## 2015-08-17 NOTE — Progress Notes (Signed)
Patient Care Team: Mackie Pai, PA-C as PCP - General (Physician Assistant) Thea Silversmith, MD as Consulting Physician (Radiation Oncology) Consuela Mimes, MD as Consulting Physician (Hematology and Oncology) Nicholas Lose, MD as Consulting Physician (Hematology and Oncology)  DIAGNOSIS: Breast cancer metastasized to bone [facility])   Staging form: Breast, AJCC 7th Edition     Pathologic: Stage IIIC (T4d, N3, cM0) - Signed by [name], MD on 01/19/2014   SUMMARY OF ONCOLOGIC HISTORY:   Breast cancer metastasized to bone ([place])   12/07/2010 - 04/12/2011 Neo-Adjuvant Chemotherapy Neoadjuvant FEC x6 followed by Taxotere x4   12/26/2010 Procedure Genetic testing showed mutation for BRCA2 [id number]   05/12/2011 Surgery Bilateral mastectomy with left axillary dissection 11/21 positive lymph nodes 5.8 cm tumor ER 95% PR 0% Ki-67 61% HER-2 negative ratio 1.39 T3, N3, 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   07/03/2011 - 08/17/2011 Radiation Therapy Radiation therapy to the chest and axilla   09/11/2011 -  Anti-estrogen oral therapy Tamoxifen later switched to Arimidex 07/2012   04/22/2012 Surgery Bilateral salpingo-oophorectomy   06/10/2015 Imaging Bone scan: uptake right lateral frontal bone, left malar region, manubrium, right 6,8 ribs; CT-CAP: New extensive infiltrate left & right hilar/mediastinal, right axillary, bil lower neck LN, 3.7 cm LUL consolidation, RML nodules,Lt Pl eff, RPLN   06/18/2015 Initial Biopsy Right axillary lymph node biopsy: 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 She continues to have persistent shortness of breath. Her shortness of breath is present at  rest or with motion. He does not seem to get worse with activity. It does not seem to get better with rest. She has it constantly. She denies any chest pain. She denies any palpitations. She denies any dizziness or lightheadedness. She is very concerned about the shortness of breath. Recent CT of the chest appears to show improvement in the lymphadenopathy. There was no evidence of any collapse of the lung. Denies any cough or expectoration.  REVIEW OF SYSTEMS:   Constitutional: Denies fevers, chills or abnormal weight loss Eyes: Denies blurriness of vision Ears, nose, mouth, throat, and face: Denies mucositis Respiratory Persistent chronic shortness of breath cardiovascular: Denies palpitation, chest discomfort or lower extremity swelling Gastrointestinal:  Denies nausea, heartburn or change in bowel habits Skin: Denies abnormal skin rashes Lymphatics: Denies new lymphadenopathy or easy bruising Neurological:Denies numbness, tingling or new weaknesses Behavioral/Psych: Depressed because of her breathing troubles  All other systems were reviewed with the patient and are negative.  I have reviewed the past medical history, past surgical history, social history and family history with the patient and they are unchanged from previous note.  ALLERGIES:  is allergic to acyclovir and related and doxycycline hydrochloride.  MEDICATIONS:  Current Outpatient Prescriptions  Medication Sig Dispense Refill  . ALPRAZolam (XANAX) 0.5 MG tablet Take 1 tablet (0.5 mg total) by mouth at bedtime as needed for sleep. 30 tablet 0  . anastrozole (ARIMIDEX) 1 MG tablet Take 1 tablet (1 mg total) by mouth daily. 90 tablet 6  . Calcium 200 MG TABS Take 200 mg by mouth daily.     . cholecalciferol (VITAMIN D) 1000 UNITS tablet Take 1,000 Units by mouth daily.     Marland Kitchen  citalopram (CELEXA) 40 MG tablet TAKE 1 TABLET BY MOUTH DAILY. 30 tablet 5  . clindamycin (CLEOCIN) 150 MG capsule Take 1 capsule (150 mg total) by  mouth 3 (three) times daily. 21 capsule 0  . dexamethasone (DECADRON) 4 MG tablet Take 0.5 tablets (2 mg total) by mouth daily. 30 tablet 1  . fluocinonide cream (LIDEX) 1.82 % Apply 1 application topically 2 (two) times daily as needed (for rash.).    Marland Kitchen gabapentin (NEURONTIN) 300 MG capsule Take 1 capsule (300 mg total) by mouth daily. 30 capsule 5  . ibuprofen (ADVIL,MOTRIN) 200 MG tablet Take 600 mg by mouth every 8 (eight) hours as needed for moderate pain (pain).     Marland Kitchen lidocaine-prilocaine (EMLA) cream Apply to affected area once (Patient taking differently: Apply 1 application topically daily as needed. For port) 30 g 3  . LORazepam (ATIVAN) 1 MG tablet Take 1 tablet (1 mg total) by mouth at bedtime. 30 tablet 0  . naproxen sodium (ANAPROX) 220 MG tablet Take 220-440 mg by mouth 2 (two) times daily as needed (for muscle aches.).     Marland Kitchen omeprazole (PRILOSEC) 20 MG capsule Take 1 capsule (20 mg total) by mouth daily. 90 capsule 6  . ondansetron (ZOFRAN) 4 MG tablet Take 1 tablet (4 mg total) by mouth every 8 (eight) hours as needed for nausea or vomiting. 30 tablet 2  . ondansetron (ZOFRAN) 8 MG tablet Take 1 tablet (8 mg total) by mouth 2 (two) times daily. Start the day after chemo for 2 days. Then take as needed for nausea or vomiting. 30 tablet 1  . prochlorperazine (COMPAZINE) 10 MG tablet Take 1 tablet (10 mg total) by mouth every 6 (six) hours as needed (Nausea or vomiting). 30 tablet 1  . promethazine (PHENERGAN) 25 MG tablet Take 1 tablet (25 mg total) by mouth every 6 (six) hours as needed for nausea. 30 tablet 1  . ranitidine (ZANTAC) 150 MG tablet TAKE 1 TABLET BY MOUTH TWO TIMES DAILY. 60 tablet 2  . scopolamine (TRANSDERM-SCOP, 1.5 MG,) 1 MG/3DAYS Place 1 patch (1.5 mg total) onto the skin every 3 (three) days. 10 patch 12  . SUMAtriptan (IMITREX) 50 MG tablet Take 1 tablet (50 mg total) by mouth every 2 (two) hours as needed for migraine. May repeat in 2 hours if headache persists or  recurs. 10 tablet 0  . traMADol (ULTRAM) 50 MG tablet Take 1 tablet (50 mg total) by mouth 3 (three) times daily as needed. (Patient taking differently: Take 50 mg by mouth 3 (three) times daily as needed for moderate pain. ) 30 tablet 0  . UNABLE TO FIND Dispense compression sleeve 20-30 mm for this patient with history of breast cancer s/p surgery 1 each 0   No current facility-administered medications for this visit.    PHYSICAL EXAMINATION: ECOG PERFORMANCE STATUS: 2 - Symptomatic, <50% confined to bed  Filed Vitals:   08/17/15 0828  BP: 99/74  Pulse: 103  Temp: 98.4 F (36.9 C)  Resp: 18   Filed Weights   08/17/15 0828  Weight: 212 lb 9.6 oz (96.435 kg)    GENERAL:alert, no distress and comfortable SKIN: skin color, texture, turgor are normal, no rashes or significant lesions EYES: normal, Conjunctiva are pink and non-injected, sclera clear OROPHARYNX:no exudate, no erythema and lips, buccal mucosa, and tongue normal  NECK: supple, thyroid normal size, non-tender, without nodularity LYMPH:  no palpable lymphadenopathy in the cervical, axillary or inguinal LUNGS: clear to auscultation and  percussion with normal breathing effort HEART: regular rate & rhythm and no murmurs and no lower extremity edema ABDOMEN:abdomen soft, non-tender and normal bowel sounds Musculoskeletal:no cyanosis of digits and no clubbing  NEURO: alert & oriented x 3 with fluent speech, no focal motor/sensory deficits  LABORATORY DATA:  I have reviewed the data as listed   Chemistry      Component Value Date/Time   NA 142 08/10/2015 0820   NA 141 07/31/2015 1929   K 3.9 08/10/2015 0820   K 3.1* 07/31/2015 1929   CL 106 07/31/2015 1929   CL 108* 07/30/2012 1430   CO2 23 08/10/2015 0820   CO2 24 07/31/2015 1929   BUN 6.1* 08/10/2015 0820   BUN 8 07/31/2015 1929   CREATININE 0.8 08/10/2015 0820   CREATININE 0.73 07/31/2015 1929      Component Value Date/Time   CALCIUM 9.2 08/10/2015 0820    CALCIUM 9.4 07/31/2015 1929   ALKPHOS 90 08/10/2015 0820   ALKPHOS 90 07/31/2015 1929   AST 21 08/10/2015 0820   AST 31 07/31/2015 1929   ALT 30 08/10/2015 0820   ALT 36 07/31/2015 1929   BILITOT 0.62 08/10/2015 0820   BILITOT 0.8 07/31/2015 1929       Lab Results  Component Value Date   WBC 6.3 08/10/2015   HGB 12.8 08/10/2015   HCT 37.0 08/10/2015   MCV 88.9 08/10/2015   PLT 98* 08/10/2015   NEUTROABS 4.9 08/10/2015   ASSESSMENT & PLAN:  Breast cancer metastasized to bone Right axillary lymph node biopsy 06/18/2015: 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 BRCA 2 mutation underwent salpingo-oophorectomy Status post bilateral mastectomies and radiation to left chest wall and axilla currently on tamoxifen since October 2012) ------------------------------------------------------------------------------------------------------------------------------------------------------- Treatment plan: Halaven day 1 and day 8 every 3 weeks 6 cycles following which we will consider starting letrozole with Ibrance Current treatment:cycle 3 day 8 Halaven Halaven toxicities: 1. Nausea and dry heaves: In spite of changing antiemetics regimen to Aloxi, patient continues to have persistent nausea and vomiting. We added Emend and oral dexamethasone which completely resolved her nausea. 2. Cough without expectoration 3. Elevation of AST and ALT: We will continue to watch and monitor this. 4. We consulted palliative care. 5. Thrombocytopenia:  Being closely monitored 6. Profound decreased appetite: encourage the patient to continue to eat good nutritious diet.  Profound shortness of breath: on Home O2 Patient does not seem to be getting any better in spite of the home oxygen. Her breathing symptoms are extremely odd. They do not seem to get worse with activity nor get better with rest.  I would like to get an echocardiogram of the heart and refer her to pulmonology for further evaluation.   Radiology review: Decrease in thoracic lymphadenopathy, Decrease in Left upper lobe mass 2.5 cm x 3.7 cm to 1.5 cm x 3.4 cm; decrease in upper abdominal LN, sclerotic lesion L4   She will need bisphosphonate therapy for suspicious bone metastases.   Scalp folliculitis: resolved with clindamycin Return to clinic in 2 weeks for cycle  4.    Orders Placed This Encounter  Procedures  . Ambulatory referral to Pulmonology    Referral Priority:  Routine    Referral Type:  Consultation    Referral Reason:  Specialty Services Required    Requested Specialty:  Pulmonary Disease    Number of Visits Requested:  1  . ECHOCARDIOGRAM COMPLETE    Standing Status: Future     Number of Occurrences:      Standing Expiration Date: 11/15/2016    Order Specific Question:  Where should this test be performed    Answer:  [facility]    Order Specific Question:  Complete or Limited study?    Answer:  Complete    Order Specific Question:  With Image Enhancing Agent or without Image Enhancing Agent?    Answer:  With Image Enhancing Agent    Order Specific Question:  Reason for exam-Echo    Answer:  Acute Respiratory Insufficiency  518.82 / R06.89   The patient has a good understanding of the overall plan. she agrees with it. she will call with any problems that may develop before the next visit here.   Rulon Eisenmenger, MD

## 2015-08-17 NOTE — Telephone Encounter (Signed)
10/18 appointments already in place,echo to [facility] for precert and [facility] pulm will call patient with appt

## 2015-08-18 ENCOUNTER — Ambulatory Visit (HOSPITAL_COMMUNITY)
Admission: RE | Admit: 2015-08-18 | Discharge: 2015-08-18 | Disposition: A | Discharge status: Home / Self Care | Payer: Medicare Other | Source: Ambulatory Visit | Attending: Hematology and Oncology | Admitting: Hematology and Oncology

## 2015-08-18 DIAGNOSIS — J9600 Acute respiratory failure, unspecified whether with hypoxia or hypercapnia: Secondary | ICD-10-CM | POA: Insufficient documentation

## 2015-08-18 DIAGNOSIS — R0602 Shortness of breath: Secondary | ICD-10-CM | POA: Diagnosis not present

## 2015-08-18 NOTE — Progress Notes (Signed)
Echocardiogram 2D Echocardiogram has been performed.  Tina Vaughan 08/18/2015, 10:54 AM

## 2015-08-20 ENCOUNTER — Telehealth: Payer: Self-pay | Admitting: *Deleted

## 2015-08-20 NOTE — Telephone Encounter (Signed)
I called her

## 2015-08-20 NOTE — Telephone Encounter (Signed)
Patient would like to know the results of her recent ECHO. Message forwarded to MD Jori Moll.

## 2015-08-23 ENCOUNTER — Telehealth: Payer: Self-pay | Admitting: Medical

## 2015-08-23 DIAGNOSIS — G4730 Sleep apnea, unspecified: Secondary | ICD-10-CM

## 2015-08-23 DIAGNOSIS — G4734 Idiopathic sleep related nonobstructive alveolar hypoventilation: Secondary | ICD-10-CM

## 2015-08-23 NOTE — Telephone Encounter (Signed)
Pt with breast cancer and metastastis to bone. Pt followed by oncology. Oncologist ordered sleep oximetry studies. Showed total desaturation 378. Average events per hour or 47. Pt is obese. CT scan chest showed mild emphysematous treatment.  Will refer to pulmonology for treatment cpap eval.

## 2015-08-24 ENCOUNTER — Ambulatory Visit: Payer: Medicare Other

## 2015-08-25 ENCOUNTER — Other Ambulatory Visit: Payer: Self-pay | Admitting: *Deleted

## 2015-08-26 ENCOUNTER — Other Ambulatory Visit: Payer: Self-pay | Admitting: Hematology and Oncology

## 2015-08-26 DIAGNOSIS — C50919 Malignant neoplasm of unspecified site of unspecified female breast: Secondary | ICD-10-CM

## 2015-08-26 DIAGNOSIS — C7951 Secondary malignant neoplasm of bone: Principal | ICD-10-CM

## 2015-08-27 ENCOUNTER — Ambulatory Visit (INDEPENDENT_AMBULATORY_CARE_PROVIDER_SITE_OTHER): Payer: Medicare Other | Admitting: Internal Medicine

## 2015-08-27 ENCOUNTER — Encounter: Payer: Self-pay | Admitting: Internal Medicine

## 2015-08-27 VITALS — BP 102/80 | HR 117 | Ht 62.0 in | Wt 212.8 lb

## 2015-08-27 DIAGNOSIS — R0600 Dyspnea, unspecified: Secondary | ICD-10-CM | POA: Diagnosis not present

## 2015-08-27 MED ORDER — RANITIDINE HCL 150 MG PO TABS: ORAL_TABLET | ORAL | Status: DC

## 2015-08-27 MED ORDER — OMEPRAZOLE 20 MG PO CPDR: DELAYED_RELEASE_CAPSULE | ORAL | Status: DC

## 2015-08-27 NOTE — Progress Notes (Signed)
Subjective:     Patient ID: Tina Vaughan, female   DOB: 1961/10/03,   MRN: [id number]  HPI   68 yowf never smoker original dx breast ca 2012 bilateral mastectomy and RT/ Chemo then July 2016 nausea/ losing wt with dx of met ca to lungs/ bone/lymph nodes and started back on chemo but then started with onset persistent sob in 06/2015 and referred to pulmonary clinic 08/27/2015 by Dr Lindi Adie.   08/27/2015 1st [facility] office visit/ Auriah Hollings   Chief Complaint  Patient presents with  . Pulmonary Consult    Pt referred by Dr Lindi Adie. Pt c/o SOB "all of the time" with or w/o exertion for the past 2 months. She also c/o non prod cough for the past month.   onset of sob indolent progressive  in 06/2015 with hoarseness and worse cough   Ok sitting and sleeping on cpap but sob across the room assoc with hoarseness and dry cough alll symptoms some better p last round of chemo.  No obvious day to day or daytime variability or assoc  cp or chest tightness, subjective wheeze or overt sinus or hb symptoms. No unusual exp hx or h/o childhood pna/ asthma or knowledge of premature birth.  Sleeping ok on cpap without nocturnal  or early am exacerbation  of respiratory  c/o's or need for noct saba. Also denies any obvious fluctuation of symptoms with weather or environmental changes or other aggravating or alleviating factors except as outlined above   Current Medications, Allergies, Complete Past Medical History, Past Surgical History, Family History, and Social History were reviewed in [employer] record.  ROS  The following are not active complaints unless bolded sore throat, dysphagia, dental problems, itching, sneezing,  nasal congestion or excess/ purulent secretions, ear ache,   fever, chills, sweats, unintended wt loss - wt  has leveled out p 40 lb off, classically pleuritic or exertional cp, hemoptysis,  orthopnea pnd or leg swelling, presyncope, palpitations, abdominal pain,  anorexia, nausea, vomiting, diarrhea  or change in bowel or bladder habits, change in stools or urine, dysuria,hematuria,  rash, arthralgias, visual complaints, headache, numbness, weakness or ataxia or problems with walking or coordination,  change in mood/affect or memory.         Review of Systems     Objective:   Physical Exam   Obese hoarse wf with prominent pseudowheeze  Wt Readings from Last 3 Encounters:  08/27/15 212 lb 12.8 oz (96.525 kg)  08/17/15 212 lb 9.6 oz (96.435 kg)  08/10/15 214 lb (97.07 kg)    Vital signs reviewed   HEENT: nl dentition, turbinates, and orophanx. Nl external ear canals without cough reflex   NECK :  without JVD/Nodes/TM/ nl carotid upstrokes bilaterally   LUNGS: no acc muscle use, clear to A and P bilaterally without cough on insp or exp maneuvers   CV:  RRR  no s3 or murmur or increase in P2, no edema   ABD:  Obese/ soft and nontender with abdominal paradox in the supine position. No bruits or organomegaly, bowel sounds nl  MS:  warm without deformities, calf tenderness, cyanosis or clubbing  SKIN: warm and dry without lesions    NEURO:  alert, approp, no deficits    I personally reviewed images and agree with radiology impression as follows:  CT contrast 08/09/15   Status post bilateral mastectomy with left axillary lymph node dissection.  Improving thoracic and upper abdominal nodal metastases, as above.  Labs  reviewed:  Chemistry      Component Value Date/Time   NA 144 08/17/2015 0847   NA 141 07/31/2015 1929   K 3.7 08/17/2015 0847   K 3.1* 07/31/2015 1929   CL 106 07/31/2015 1929   CL 108* 07/30/2012 1430   CO2 26 08/17/2015 0847   CO2 24 07/31/2015 1929   BUN 8.6 08/17/2015 0847   BUN 8 07/31/2015 1929   CREATININE 0.8 08/17/2015 0847   CREATININE 0.73 07/31/2015 1929      Component Value Date/Time   CALCIUM 9.6 08/17/2015 0847   CALCIUM 9.4 07/31/2015 1929   ALKPHOS 63 08/17/2015 0847   ALKPHOS 90  07/31/2015 1929   AST 29 08/17/2015 0847   AST 31 07/31/2015 1929   ALT 48 08/17/2015 0847   ALT 36 07/31/2015 1929   BILITOT 0.67 08/17/2015 0847   BILITOT 0.8 07/31/2015 1929           Lab Results  Component Value Date   TSH 1.70 05/21/2015       Lab Results  Component Value Date   WBC 3.8* 08/17/2015   HGB 11.7 08/17/2015   HCT 33.6* 08/17/2015   MCV 88.7 08/17/2015   PLT 206 08/17/2015       Lab Results  Component Value Date   ESRSEDRATE 29* 06/09/2015         Assessment:

## 2015-08-27 NOTE — Patient Instructions (Addendum)
No need for 02 daytime but be sure to use at bedtime with your cpap machine   Try prilosec 20mg  x 2 Take 30-60 min before first meal of the day and zantac 300 mg mg one @  bedtime until  Return  GERD (REFLUX)  is an extremely common cause of respiratory symptoms just like yours , many times with no obvious heartburn at all.    It can be treated with medication, but also with lifestyle changes including elevation of the head of your bed (ideally with 6 inch  bed blocks),  Smoking cessation, avoidance of late meals, excessive alcohol, and avoid fatty foods, chocolate, peppermint, colas, red wine, and acidic juices such as orange juice.  NO MINT OR MENTHOL PRODUCTS SO NO COUGH DROPS  USE SUGARLESS CANDY INSTEAD (Jolley ranchers or Stover's or Life Savers) or even ice chips will also do - the key is to swallow to prevent all throat clearing. NO OIL BASED VITAMINS - use powdered substitutes.   Please see patient coordinator before you leave today  to schedule  Sniff test to see how your diaphragms are doing  Please schedule a follow up office visit in 2 weeks, sooner if needed  - check bnp on return

## 2015-08-28 NOTE — Assessment & Plan Note (Addendum)
-  08/18/15 echo Features are consistent with a pseudonormal left ventricular filling pattern, with concomitant abnormal relaxation and increased filling pressure (grade 2 diastolic dysfunction). -27/61/4709  Walked RA x 3 laps @ 185 ft each stopped due to End of study, nl pace, very min  sob / no  desat   - spirometry 08/27/2015 > restrictive changes  - Diaphragm fluoroscopy  09/03/15 >>>   Symptoms are markedly disproportionate to objective findings and not clear this is a lung problem but pt does appear to have difficult airway management issues. DDX of  difficult airways management all start with A and  include Adherence, Ace Inhibitors, Acid Reflux, Active Sinus Disease, Alpha 1 Antitripsin deficiency, Anxiety masquerading as Airways dz,  ABPA,  allergy(esp in young), Aspiration (esp in elderly), Adverse effects of meds,  Active smokers, A bunch of PE's (a small clot burden can't cause this syndrome unless there is already severe underlying pulm or vascular dz with poor reserve) plus two Bs  = Bronchiectasis and Beta blocker use..and one C= CHF   Adherence is always the initial "prime suspect" and is a multilayered concern that requires a "trust but verify" approach in every patient - starting with knowing how to use medications, especially inhalers, correctly, keeping up with refills and understanding the fundamental difference between maintenance and prns vs those medications only taken for a very short course and then stopped and not refilled.   ? Acid (or non-acid) GERD > always difficult to exclude as up to 75% of pts in some series report no assoc GI/ Heartburn symptoms> rec max (24h)  acid suppression and diet restrictions/ reviewed and instructions given in writing.   ? Anxiety related to dx of met ca > dx of exclusion esp since only happens with exertion  ? Adverse effects of meds, esp chemo > no evidence ILD by last chemo and better since that round so doubt  ? chf > note Grade 2  diastolic dysfunction but no orthopnea/edema so less likely to be contributing but really needs BNP to complete the w/u   ? A Bunch of pe's > We could've missed a few small ones by a contrasted CT scan was done but as would not cause significant dyspnea syndrome but small peripheral infarcts /  pleuritic pain, neither of which apply here.  I'm concerned about her small lung volumes on spirometry and exam which suggest abdominal paradox suggesting she has very poor diaphragm function which may all be chronic and related to obesity but the best way to sort it out [name] to do bilateral sniff test and I ordered that.  Total time = 65m review case with pt/ discussion/ counseling/ giving and going over instructions (see avs)

## 2015-08-28 NOTE — Assessment & Plan Note (Signed)
Complicated by osa/ poor diaphragm excursion/  Body mass index is 38.91   Lab Results  Component Value Date   TSH 1.70 05/21/2015     Contributing to gerd tendency/ doe/reviewed the need and the process to achieve and maintain neg calorie balance > defer f/u primary care including intermittently monitoring thyroid status

## 2015-08-30 NOTE — Progress Notes (Signed)
Per RN note Ephraim Hamburger), order for CPAP mask was placed.  It will take 7-14 days and will be mailed to the pt directly.  Pt is aware.

## 2015-08-30 NOTE — Assessment & Plan Note (Signed)
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 BRCA 2 mutation underwent salpingo-oophorectomy Status post bilateral mastectomies and radiation to left chest wall and axilla currently on tamoxifen since October 2012) ------------------------------------------------------------------------------------------------------------------------------------------------------- Treatment plan: Halaven day 1 and day 8 every 3 weeks 6 cycles following which we will consider starting letrozole with Ibrance Current treatment:cycle 4 day 1 Halaven Halaven toxicities: 1. Nausea and dry heaves: In spite of changing antiemetics regimen to Aloxi, patient continues to have persistent nausea and vomiting. We added Emend and oral dexamethasone which completely resolved her nausea. 2. Cough without expectoration 3. Elevation of AST and ALT: We will continue to watch and monitor this. 4. We consulted palliative care. 5. Thrombocytopenia: Being closely monitored 6. Profound decreased appetite: encourage the patient to continue to eat good nutritious diet.  Profound shortness of breath: on Home O2 Patient does not seem to be getting any better in spite of the home oxygen. Her breathing symptoms are extremely odd. They do not seem to get worse with activity nor get better with rest.  I would like to get an echocardiogram of the heart and refer her to pulmonology for further evaluation.   Radiology review: Decrease in thoracic  lymphadenopathy, Decrease in Left upper lobe mass 2.5 cm x 3.7 cm to 1.5 cm x 3.4 cm; decrease in upper abdominal LN, sclerotic lesion L4   She will need bisphosphonate therapy for suspicious bone metastases.   Scalp folliculitis: resolved with clindamycin Return to clinic in 3 weeks for cycle 5.

## 2015-08-31 ENCOUNTER — Other Ambulatory Visit (HOSPITAL_BASED_OUTPATIENT_CLINIC_OR_DEPARTMENT_OTHER): Payer: Medicare Other

## 2015-08-31 ENCOUNTER — Ambulatory Visit (HOSPITAL_BASED_OUTPATIENT_CLINIC_OR_DEPARTMENT_OTHER): Payer: Medicare Other

## 2015-08-31 ENCOUNTER — Ambulatory Visit (HOSPITAL_BASED_OUTPATIENT_CLINIC_OR_DEPARTMENT_OTHER): Payer: Medicare Other | Admitting: Hematology and Oncology

## 2015-08-31 ENCOUNTER — Telehealth: Payer: Self-pay | Admitting: Hematology and Oncology

## 2015-08-31 ENCOUNTER — Encounter: Payer: Self-pay | Admitting: Hematology and Oncology

## 2015-08-31 VITALS — BP 110/77 | HR 96 | Temp 98.1°F | Resp 18 | Ht 62.0 in | Wt 211.8 lb

## 2015-08-31 DIAGNOSIS — R05 Cough: Secondary | ICD-10-CM | POA: Diagnosis not present

## 2015-08-31 DIAGNOSIS — R740 Nonspecific elevation of levels of transaminase and lactic acid dehydrogenase [LDH]: Secondary | ICD-10-CM | POA: Diagnosis not present

## 2015-08-31 DIAGNOSIS — R0602 Shortness of breath: Secondary | ICD-10-CM | POA: Diagnosis not present

## 2015-08-31 DIAGNOSIS — C773 Secondary and unspecified malignant neoplasm of axilla and upper limb lymph nodes: Secondary | ICD-10-CM | POA: Diagnosis not present

## 2015-08-31 DIAGNOSIS — C7951 Secondary malignant neoplasm of bone: Secondary | ICD-10-CM

## 2015-08-31 DIAGNOSIS — D696 Thrombocytopenia, unspecified: Secondary | ICD-10-CM | POA: Diagnosis not present

## 2015-08-31 DIAGNOSIS — R630 Anorexia: Secondary | ICD-10-CM | POA: Diagnosis not present

## 2015-08-31 DIAGNOSIS — C50912 Malignant neoplasm of unspecified site of left female breast: Secondary | ICD-10-CM

## 2015-08-31 DIAGNOSIS — C50919 Malignant neoplasm of unspecified site of unspecified female breast: Secondary | ICD-10-CM

## 2015-08-31 LAB — CBC WITH DIFFERENTIAL/PLATELET
BASO%: 0.4 % (ref 0.0–2.0)
BASOS ABS: 0 10*3/uL (ref 0.0–0.1)
EOS%: 0.1 % (ref 0.0–7.0)
Eosinophils Absolute: 0 10*3/uL (ref 0.0–0.5)
HCT: 34 % — ABNORMAL LOW (ref 34.8–46.6)
HGB: 11.7 g/dL (ref 11.6–15.9)
LYMPH#: 1.7 10*3/uL (ref 0.9–3.3)
LYMPH%: 22.3 % (ref 14.0–49.7)
MCH: 31.4 pg (ref 25.1–34.0)
MCHC: 34.4 g/dL (ref 31.5–36.0)
MCV: 91.2 fL (ref 79.5–101.0)
MONO#: 0.3 10*3/uL (ref 0.1–0.9)
MONO%: 4.4 % (ref 0.0–14.0)
NEUT#: 5.4 10*3/uL (ref 1.5–6.5)
NEUT%: 72.8 % (ref 38.4–76.8)
PLATELETS: 85 10*3/uL — AB (ref 145–400)
RBC: 3.73 10*6/uL (ref 3.70–5.45)
RDW: 17.5 % — ABNORMAL HIGH (ref 11.2–14.5)
WBC: 7.5 10*3/uL (ref 3.9–10.3)

## 2015-08-31 LAB — COMPREHENSIVE METABOLIC PANEL (CC13)
ALT: 26 U/L (ref 0–55)
ANION GAP: 8 meq/L (ref 3–11)
AST: 17 U/L (ref 5–34)
Albumin: 4 g/dL (ref 3.5–5.0)
Alkaline Phosphatase: 82 U/L (ref 40–150)
BUN: 9.2 mg/dL (ref 7.0–26.0)
CALCIUM: 9.6 mg/dL (ref 8.4–10.4)
CHLORIDE: 109 meq/L (ref 98–109)
CO2: 25 mEq/L (ref 22–29)
Creatinine: 0.8 mg/dL (ref 0.6–1.1)
Glucose: 109 mg/dl (ref 70–140)
POTASSIUM: 4.1 meq/L (ref 3.5–5.1)
Sodium: 142 mEq/L (ref 136–145)
Total Bilirubin: 0.63 mg/dL (ref 0.20–1.20)
Total Protein: 6.6 g/dL (ref 6.4–8.3)

## 2015-08-31 MED ORDER — SODIUM CHLORIDE 0.9 % IJ SOLN
10.0000 mL | INTRAMUSCULAR | Status: DC | PRN
  Administered 2015-08-31: 10 mL
  Filled 2015-08-31: qty 10

## 2015-08-31 MED ORDER — PALONOSETRON HCL INJECTION 0.25 MG/5ML
INTRAVENOUS | Status: AC
  Filled 2015-08-31: qty 5

## 2015-08-31 MED ORDER — HEPARIN SOD (PORK) LOCK FLUSH 100 UNIT/ML IV SOLN
500.0000 [IU] | Freq: Once | INTRAVENOUS | Status: AC | PRN
  Administered 2015-08-31: 500 [IU]
  Filled 2015-08-31: qty 5

## 2015-08-31 MED ORDER — SODIUM CHLORIDE 0.9 % IV SOLN
1.4250 mg/m2 | Freq: Once | INTRAVENOUS | Status: AC
  Administered 2015-08-31: 3 mg via INTRAVENOUS
  Filled 2015-08-31: qty 6

## 2015-08-31 MED ORDER — SODIUM CHLORIDE 0.9 % IV SOLN
Freq: Once | INTRAVENOUS | Status: AC
  Administered 2015-08-31: 11:00:00 via INTRAVENOUS
  Filled 2015-08-31: qty 5

## 2015-08-31 MED ORDER — PALONOSETRON HCL INJECTION 0.25 MG/5ML
0.2500 mg | Freq: Once | INTRAVENOUS | Status: AC
  Administered 2015-08-31: 0.25 mg via INTRAVENOUS

## 2015-08-31 MED ORDER — SODIUM CHLORIDE 0.9 % IV SOLN
Freq: Once | INTRAVENOUS | Status: AC
  Administered 2015-08-31: 11:00:00 via INTRAVENOUS

## 2015-08-31 NOTE — Addendum Note (Signed)
Addended by: Prentiss Bells on: 08/31/2015 10:13 AM   Modules accepted: Medications

## 2015-08-31 NOTE — Progress Notes (Signed)
Patient Care Team: Mackie Pai, PA-C as PCP - General (Physician Assistant) Thea Silversmith, MD as Consulting Physician (Radiation Oncology) Consuela Mimes, MD as Consulting Physician (Hematology and Oncology) Nicholas Lose, MD as Consulting Physician (Hematology and Oncology)  DIAGNOSIS: Breast cancer metastasized to bone [facility])   Staging form: Breast, AJCC 7th Edition     Pathologic: Stage IIIC (T4d, N3, cM0) - Signed by [name], MD on 01/19/2014   SUMMARY OF ONCOLOGIC HISTORY:   Breast cancer metastasized to bone ([place])   12/07/2010 - 04/12/2011 Neo-Adjuvant Chemotherapy Neoadjuvant FEC x6 followed by Taxotere x4   12/26/2010 Procedure Genetic testing showed mutation for BRCA2 [id number]   05/12/2011 Surgery Bilateral mastectomy with left axillary dissection 11/21 positive lymph nodes 5.8 cm tumor ER 95% PR 0% Ki-67 61% HER-2 negative ratio 1.39 T3, N3, 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   07/03/2011 - 08/17/2011 Radiation Therapy Radiation therapy to the chest and axilla   09/11/2011 -  Anti-estrogen oral therapy Tamoxifen later switched to Arimidex 07/2012   04/22/2012 Surgery Bilateral salpingo-oophorectomy   06/10/2015 Imaging Bone scan: uptake right lateral frontal bone, left malar region, manubrium, right 6,8 ribs; CT-CAP: New extensive infiltrate left & right hilar/mediastinal, right axillary, bil lower neck LN, 3.7 cm LUL consolidation, RML nodules,Lt Pl eff, RPLN   06/18/2015 Initial Biopsy Right axillary lymph node biopsy: 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 She is tolerating the treatment fairly well. She does not  have any major side effects of the treatment. She had profound shortness of breath for which I referred her to pulmonology. She has seen Dr. Melvyn Novas  Who is evaluating her and started her on proton pump inhibitor therapy. She appears to be doing a lot better since then. Denies any new complaints or concerns. Denies any neuropathy.  REVIEW OF SYSTEMS:   Constitutional: Denies fevers, chills or abnormal weight loss Eyes: Denies blurriness of vision Ears, nose, mouth, throat, and face: Denies mucositis or sore throat Respiratory:  Chronic shortness of breath Cardiovascular: Denies palpitation, chest discomfort or lower extremity swelling Gastrointestinal:   Acid reflux Skin: Denies abnormal skin rashes Lymphatics: Denies new lymphadenopathy or easy bruising Neurological:Denies numbness, tingling or new weaknesses Behavioral/Psych: Mood is stable, no new changes   All other systems were reviewed with the patient and are negative.  I have reviewed the past medical history, past surgical history, social history and family history with the patient and they are unchanged from previous note.  ALLERGIES:  is allergic to doxycycline hydrochloride.  MEDICATIONS:  Current Outpatient Prescriptions  Medication Sig Dispense Refill  . ALPRAZolam (XANAX) 0.5 MG tablet Take 1 tablet (0.5 mg total) by mouth at bedtime as needed for sleep. 30 tablet 0  . anastrozole (ARIMIDEX) 1 MG tablet Take 1 tablet (1 mg total) by mouth daily. 90 tablet 6  . Calcium 200 MG TABS Take 200 mg by mouth daily.     . cholecalciferol (VITAMIN D) 1000 UNITS tablet Take 1,000 Units by mouth daily.     . citalopram (CELEXA) 40 MG tablet TAKE 1 TABLET BY MOUTH DAILY. 30 tablet 5  . clindamycin (CLEOCIN)  150 MG capsule Take 1 capsule (150 mg total) by mouth 3 (three) times daily. 21 capsule 0  . dexamethasone (DECADRON) 4 MG tablet Take 0.5 tablets (2 mg total) by mouth daily. 30 tablet 1  . fluocinonide cream (LIDEX) 9.24 % Apply 1  application topically 2 (two) times daily as needed (for rash.).    Marland Kitchen gabapentin (NEURONTIN) 300 MG capsule Take 1 capsule (300 mg total) by mouth daily. 30 capsule 5  . ibuprofen (ADVIL,MOTRIN) 200 MG tablet Take 600 mg by mouth every 8 (eight) hours as needed for moderate pain (pain).     Marland Kitchen lidocaine-prilocaine (EMLA) cream Apply to affected area once (Patient taking differently: Apply 1 application topically daily as needed. For port) 30 g 3  . LORazepam (ATIVAN) 1 MG tablet Take 1 tablet (1 mg total) by mouth at bedtime. 30 tablet 0  . naproxen sodium (ANAPROX) 220 MG tablet Take 220-440 mg by mouth 2 (two) times daily as needed (for muscle aches.).     Marland Kitchen omeprazole (PRILOSEC) 20 MG capsule Take 2 x  30-60 min before first meal of the day    . ondansetron (ZOFRAN) 8 MG tablet TAKE 1 TABLET BY MOUTH 2 TIMES DAILY. START THE DAY AFTER CHEMO FOR 2 DAYS. THEN TAKE AS NEEDED FOR NAUSEA OR VOMITING. 30 tablet 1  . prochlorperazine (COMPAZINE) 10 MG tablet Take 1 tablet (10 mg total) by mouth every 6 (six) hours as needed (Nausea or vomiting). 30 tablet 1  . promethazine (PHENERGAN) 25 MG tablet Take 1 tablet (25 mg total) by mouth every 6 (six) hours as needed for nausea. 30 tablet 1  . ranitidine (ZANTAC) 150 MG tablet 2 at bedtime    . SUMAtriptan (IMITREX) 50 MG tablet Take 1 tablet (50 mg total) by mouth every 2 (two) hours as needed for migraine. May repeat in 2 hours if headache persists or recurs. 10 tablet 0  . traMADol (ULTRAM) 50 MG tablet Take 1 tablet (50 mg total) by mouth 3 (three) times daily as needed. (Patient taking differently: Take 50 mg by mouth 3 (three) times daily as needed for moderate pain. ) 30 tablet 0  . UNABLE TO FIND Dispense compression sleeve 20-30 mm for this patient with history of breast cancer s/p surgery 1 each 0   No current facility-administered medications for this visit.    PHYSICAL EXAMINATION: ECOG PERFORMANCE STATUS: 1 - Symptomatic but completely  ambulatory  Filed Vitals:   08/31/15 0902  BP: 110/77  Pulse: 96  Temp: 98.1 F (36.7 C)  Resp: 18   Filed Weights   08/31/15 0902  Weight: 211 lb 12.8 oz (96.072 kg)    GENERAL:alert, no distress and comfortable SKIN: skin color, texture, turgor are normal, no rashes or significant lesions EYES: normal, Conjunctiva are pink and non-injected, sclera clear OROPHARYNX:no exudate, no erythema and lips, buccal mucosa, and tongue normal  NECK: supple, thyroid normal size, non-tender, without nodularity LYMPH:  no palpable lymphadenopathy in the cervical, axillary or inguinal LUNGS:   Clear to auscultation HEART: regular rate & rhythm and no murmurs and no lower extremity edema ABDOMEN:abdomen soft, non-tender and normal bowel sounds Musculoskeletal:no cyanosis of digits and no clubbing  NEURO: alert & oriented x 3 with fluent speech, no focal motor/sensory deficits   LABORATORY DATA:  I have reviewed the data as listed   Chemistry      Component Value Date/Time   NA 144 08/17/2015 0847   NA 141 07/31/2015 1929  K 3.7 08/17/2015 0847   K 3.1* 07/31/2015 1929   CL 106 07/31/2015 1929   CL 108* 07/30/2012 1430   CO2 26 08/17/2015 0847   CO2 24 07/31/2015 1929   BUN 8.6 08/17/2015 0847   BUN 8 07/31/2015 1929   CREATININE 0.8 08/17/2015 0847   CREATININE 0.73 07/31/2015 1929      Component Value Date/Time   CALCIUM 9.6 08/17/2015 0847   CALCIUM 9.4 07/31/2015 1929   ALKPHOS 63 08/17/2015 0847   ALKPHOS 90 07/31/2015 1929   AST 29 08/17/2015 0847   AST 31 07/31/2015 1929   ALT 48 08/17/2015 0847   ALT 36 07/31/2015 1929   BILITOT 0.67 08/17/2015 0847   BILITOT 0.8 07/31/2015 1929       Lab Results  Component Value Date   WBC 7.5 08/31/2015   HGB 11.7 08/31/2015   HCT 34.0* 08/31/2015   MCV 91.2 08/31/2015   PLT 85* 08/31/2015   NEUTROABS 5.4 08/31/2015    ASSESSMENT & PLAN:  Breast cancer metastasized to bone Right axillary lymph node biopsy 06/18/2015:  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 BRCA 2 mutation underwent salpingo-oophorectomy Status post bilateral mastectomies and radiation to left chest wall and axilla currently on tamoxifen since October 2012) ------------------------------------------------------------------------------------------------------------------------------------------------------- Treatment plan: Halaven day 1 and day 8 every 3 weeks 6 cycles following which we will consider starting letrozole with Ibrance Current treatment:cycle 4 day 1 Halaven Halaven toxicities: 1. Nausea and dry heaves: In spite of changing antiemetics regimen to Aloxi, patient continues to have persistent nausea and vomiting. We added Emend and oral dexamethasone which completely resolved her nausea. 2. Cough without expectoration 3. Elevation of AST and ALT: We will continue to watch and monitor this. 4. We consulted palliative care. 5. Thrombocytopenia: Platelets of 85 today. 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 3 weeks ago her platelets were 98 and after one week of chemotherapy be improved up to 206, and in 2 weeks since then platelets dropped to 85. This suggested that chemotherapy was helping improve her platelet count. 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 We will watch and monitor her with another CBC in 1 week. 6. Profound decreased appetite: encourage the patient to continue to eat good nutritious diet.  Profound shortness of breath: on Home O2 Patient has seen Dr. Melvyn Novas with pulmonology. He  is treating her for acid reflux. She has a Sniff test coming up  Radiology review: Decrease in thoracic lymphadenopathy, Decrease in Left upper lobe mass 2.5 cm x 3.7 cm to 1.5 cm x 3.4 cm; decrease in upper abdominal LN, sclerotic lesion L4   Bone metastases: She will need bisphosphonate therapy for suspicious bone metastases. She will get Zometa every 3 months starting next week   Scalp folliculitis: resolved with clindamycin Return to clinic in 1 week for platelet count check.    No orders of the defined types were placed in this encounter.   The patient has a good understanding of the overall plan. she agrees with it. she will call with any problems that  may develop before the next visit here.   Rulon Eisenmenger, MD 08/31/2015

## 2015-08-31 NOTE — Telephone Encounter (Signed)
Gave patient avs report and appointments for October and November.  °

## 2015-08-31 NOTE — Progress Notes (Signed)
Per Dr. Lindi Adie, ok to treat with platelets 85

## 2015-08-31 NOTE — Patient Instructions (Signed)
Cancer Center Discharge Instructions for Patients Receiving Chemotherapy  Today you received the following chemotherapy agents Halaven  To help prevent nausea and vomiting after your treatment, we encourage you to take your nausea medication as needed   If you develop nausea and vomiting that is not controlled by your nausea medication, call the clinic.   BELOW ARE SYMPTOMS THAT SHOULD BE REPORTED IMMEDIATELY:  *FEVER GREATER THAN 100.5 F  *CHILLS WITH OR WITHOUT FEVER  NAUSEA AND VOMITING THAT IS NOT CONTROLLED WITH YOUR NAUSEA MEDICATION  *UNUSUAL SHORTNESS OF BREATH  *UNUSUAL BRUISING OR BLEEDING  TENDERNESS IN MOUTH AND THROAT WITH OR WITHOUT PRESENCE OF ULCERS  *URINARY PROBLEMS  *BOWEL PROBLEMS  UNUSUAL RASH Items with * indicate a potential emergency and should be followed up as soon as possible.  Feel free to call the clinic you have any questions or concerns. The clinic phone number is [phone].  Please show the CHEMO ALERT CARD at check-in to the Emergency Department and triage nurse.   

## 2015-09-03 ENCOUNTER — Ambulatory Visit (HOSPITAL_COMMUNITY)
Admission: RE | Admit: 2015-09-03 | Discharge: 2015-09-03 | Disposition: A | Discharge status: Home / Self Care | Payer: Medicare Other | Source: Ambulatory Visit | Attending: Internal Medicine | Admitting: Internal Medicine

## 2015-09-03 DIAGNOSIS — Z853 Personal history of malignant neoplasm of breast: Secondary | ICD-10-CM | POA: Insufficient documentation

## 2015-09-03 DIAGNOSIS — R0600 Dyspnea, unspecified: Secondary | ICD-10-CM | POA: Diagnosis not present

## 2015-09-03 DIAGNOSIS — R0602 Shortness of breath: Secondary | ICD-10-CM | POA: Diagnosis not present

## 2015-09-06 ENCOUNTER — Other Ambulatory Visit: Payer: Self-pay | Admitting: Hematology and Oncology

## 2015-09-06 DIAGNOSIS — C50919 Malignant neoplasm of unspecified site of unspecified female breast: Secondary | ICD-10-CM

## 2015-09-06 DIAGNOSIS — C7951 Secondary malignant neoplasm of bone: Principal | ICD-10-CM

## 2015-09-06 NOTE — Assessment & Plan Note (Signed)
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 BRCA 2 mutation underwent salpingo-oophorectomy Status post bilateral mastectomies and radiation to left chest wall and axilla currently on tamoxifen since October 2012) ------------------------------------------------------------------------------------------------------------------------------------------------------- Treatment plan: Halaven day 1 and day 8 every 3 weeks 6 cycles following which we will consider starting letrozole with Ibrance Current treatment:cycle 4 day 8 Halaven Halaven toxicities: 1. Nausea and dry heaves: In spite of changing antiemetics regimen to Aloxi, patient continues to have persistent nausea and vomiting. We added Emend and oral dexamethasone which completely resolved her nausea. 2. Cough without expectoration 3. Elevation of AST and ALT: We will continue to watch and monitor this. 4. We consulted palliative care. 5. Thrombocytopenia: Platelets of 85 today. 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 3 weeks ago her platelets were 98 and after one week of chemotherapy be improved up to 206, and in 2 weeks since then platelets dropped to 85. This suggested that chemotherapy was helping improve her platelet count. 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 We will watch and monitor her with another CBC in 1 week. 6. Profound decreased appetite: encourage the patient to continue to eat good nutritious diet.  Profound shortness of breath: on Home  O2 Patient has seen Dr. Melvyn Novas with pulmonology. He is treating her for acid reflux. She has a Sniff test coming up  Radiology review: Decrease in thoracic lymphadenopathy, Decrease in Left upper lobe mass 2.5 cm x 3.7 cm to 1.5 cm x 3.4 cm; decrease in upper abdominal LN, sclerotic lesion L4   Bone metastases: She will need bisphosphonate therapy for suspicious bone metastases. She will get Zometa every 3 months.   Scalp folliculitis: resolved with clindamycin Return to clinic in 2 weeks for platelet count check and cycle 5.

## 2015-09-06 NOTE — Progress Notes (Signed)
Quick Note:  lmtcb for pt. ______ 

## 2015-09-07 ENCOUNTER — Telehealth: Payer: Self-pay | Admitting: Hematology and Oncology

## 2015-09-07 ENCOUNTER — Ambulatory Visit (HOSPITAL_BASED_OUTPATIENT_CLINIC_OR_DEPARTMENT_OTHER): Payer: Medicare Other | Admitting: Hematology and Oncology

## 2015-09-07 ENCOUNTER — Encounter: Payer: Self-pay | Admitting: Hematology and Oncology

## 2015-09-07 ENCOUNTER — Other Ambulatory Visit (HOSPITAL_BASED_OUTPATIENT_CLINIC_OR_DEPARTMENT_OTHER): Payer: Medicare Other

## 2015-09-07 ENCOUNTER — Ambulatory Visit (HOSPITAL_BASED_OUTPATIENT_CLINIC_OR_DEPARTMENT_OTHER): Payer: Medicare Other

## 2015-09-07 ENCOUNTER — Ambulatory Visit: Payer: Medicare Other

## 2015-09-07 VITALS — BP 121/73 | HR 106 | Temp 97.5°F | Resp 18 | Ht 62.0 in | Wt 210.0 lb

## 2015-09-07 DIAGNOSIS — C773 Secondary and unspecified malignant neoplasm of axilla and upper limb lymph nodes: Secondary | ICD-10-CM

## 2015-09-07 DIAGNOSIS — D696 Thrombocytopenia, unspecified: Secondary | ICD-10-CM

## 2015-09-07 DIAGNOSIS — C50912 Malignant neoplasm of unspecified site of left female breast: Secondary | ICD-10-CM | POA: Diagnosis not present

## 2015-09-07 DIAGNOSIS — C50919 Malignant neoplasm of unspecified site of unspecified female breast: Secondary | ICD-10-CM

## 2015-09-07 DIAGNOSIS — R630 Anorexia: Secondary | ICD-10-CM

## 2015-09-07 DIAGNOSIS — R740 Nonspecific elevation of levels of transaminase and lactic acid dehydrogenase [LDH]: Secondary | ICD-10-CM

## 2015-09-07 DIAGNOSIS — C7951 Secondary malignant neoplasm of bone: Secondary | ICD-10-CM | POA: Diagnosis not present

## 2015-09-07 DIAGNOSIS — R05 Cough: Secondary | ICD-10-CM

## 2015-09-07 DIAGNOSIS — R0602 Shortness of breath: Secondary | ICD-10-CM | POA: Diagnosis not present

## 2015-09-07 LAB — COMPREHENSIVE METABOLIC PANEL (CC13)
ALBUMIN: 4.1 g/dL (ref 3.5–5.0)
ALK PHOS: 62 U/L (ref 40–150)
ALT: 34 U/L (ref 0–55)
AST: 20 U/L (ref 5–34)
Anion Gap: 9 mEq/L (ref 3–11)
BUN: 8.9 mg/dL (ref 7.0–26.0)
CALCIUM: 9.7 mg/dL (ref 8.4–10.4)
CO2: 28 mEq/L (ref 22–29)
CREATININE: 0.7 mg/dL (ref 0.6–1.1)
Chloride: 108 mEq/L (ref 98–109)
EGFR: >90 mL/min/{1.73_m2} (ref >90)
GLUCOSE: 110 mg/dL (ref 70–140)
Potassium: 3.6 mEq/L (ref 3.5–5.1)
SODIUM: 144 meq/L (ref 136–145)
TOTAL PROTEIN: 6.5 g/dL (ref 6.4–8.3)
Total Bilirubin: 0.61 mg/dL (ref 0.20–1.20)

## 2015-09-07 LAB — CBC WITH DIFFERENTIAL/PLATELET
BASO%: 1.1 % (ref 0.0–2.0)
BASOS ABS: 0 10*3/uL (ref 0.0–0.1)
EOS%: 0.2 % (ref 0.0–7.0)
Eosinophils Absolute: 0 10*3/uL (ref 0.0–0.5)
HEMATOCRIT: 30.9 % — AB (ref 34.8–46.6)
HEMOGLOBIN: 10.8 g/dL — AB (ref 11.6–15.9)
LYMPH#: 1.4 10*3/uL (ref 0.9–3.3)
LYMPH%: 33.6 % (ref 14.0–49.7)
MCH: 31.7 pg (ref 25.1–34.0)
MCHC: 35.1 g/dL (ref 31.5–36.0)
MCV: 90.5 fL (ref 79.5–101.0)
MONO#: 0.2 10*3/uL (ref 0.1–0.9)
MONO%: 4.8 % (ref 0.0–14.0)
NEUT%: 60.3 % (ref 38.4–76.8)
NEUTROS ABS: 2.6 10*3/uL (ref 1.5–6.5)
Platelets: 205 10*3/uL (ref 145–400)
RBC: 3.41 10*6/uL — ABNORMAL LOW (ref 3.70–5.45)
RDW: 18.1 % — AB (ref 11.2–14.5)
WBC: 4.3 10*3/uL (ref 3.9–10.3)

## 2015-09-07 MED ORDER — SODIUM CHLORIDE 0.9 % IV SOLN
1.4100 mg/m2 | Freq: Once | INTRAVENOUS | Status: AC
  Administered 2015-09-07: 3 mg via INTRAVENOUS
  Filled 2015-09-07: qty 6

## 2015-09-07 MED ORDER — OMEPRAZOLE 20 MG PO CPDR: 20.0000 mg | DELAYED_RELEASE_CAPSULE | Freq: Every day | ORAL | Status: DC

## 2015-09-07 MED ORDER — PALONOSETRON HCL INJECTION 0.25 MG/5ML
0.2500 mg | Freq: Once | INTRAVENOUS | Status: AC
  Administered 2015-09-07: 0.25 mg via INTRAVENOUS

## 2015-09-07 MED ORDER — SODIUM CHLORIDE 0.9 % IV SOLN
Freq: Once | INTRAVENOUS | Status: AC
  Administered 2015-09-07: 09:00:00 via INTRAVENOUS

## 2015-09-07 MED ORDER — ZOLEDRONIC ACID 4 MG/100ML IV SOLN
4.0000 mg | Freq: Once | INTRAVENOUS | Status: AC
  Administered 2015-09-07: 4 mg via INTRAVENOUS
  Filled 2015-09-07: qty 100

## 2015-09-07 MED ORDER — SODIUM CHLORIDE 0.9 % IJ SOLN
10.0000 mL | INTRAMUSCULAR | Status: DC | PRN
  Administered 2015-09-07: 10 mL
  Filled 2015-09-07: qty 10

## 2015-09-07 MED ORDER — PEGFILGRASTIM 6 MG/0.6ML ~~LOC~~ PSKT
6.0000 mg | PREFILLED_SYRINGE | Freq: Once | SUBCUTANEOUS | Status: AC
  Administered 2015-09-07: 6 mg via SUBCUTANEOUS
  Filled 2015-09-07: qty 0.6

## 2015-09-07 MED ORDER — PALONOSETRON HCL INJECTION 0.25 MG/5ML
INTRAVENOUS | Status: AC
  Filled 2015-09-07: qty 5

## 2015-09-07 MED ORDER — SODIUM CHLORIDE 0.9 % IV SOLN
Freq: Once | INTRAVENOUS | Status: AC
  Administered 2015-09-07: 10:00:00 via INTRAVENOUS
  Filled 2015-09-07: qty 5

## 2015-09-07 MED ORDER — HEPARIN SOD (PORK) LOCK FLUSH 100 UNIT/ML IV SOLN
500.0000 [IU] | Freq: Once | INTRAVENOUS | Status: AC | PRN
  Administered 2015-09-07: 500 [IU]
  Filled 2015-09-07: qty 5

## 2015-09-07 NOTE — Patient Instructions (Signed)
[facility] Discharge Instructions for Patients Receiving Chemotherapy  Today you received the following chemotherapy agents halaven  To help prevent nausea and vomiting after your treatment, we encourage you to take your nausea medication as directed   If you develop nausea and vomiting that is not controlled by your nausea medication, call the clinic.   BELOW ARE SYMPTOMS THAT SHOULD BE REPORTED IMMEDIATELY:  *FEVER GREATER THAN 100.5 F  *CHILLS WITH OR WITHOUT FEVER  NAUSEA AND VOMITING THAT IS NOT CONTROLLED WITH YOUR NAUSEA MEDICATION  *UNUSUAL SHORTNESS OF BREATH  *UNUSUAL BRUISING OR BLEEDING  TENDERNESS IN MOUTH AND THROAT WITH OR WITHOUT PRESENCE OF ULCERS  *URINARY PROBLEMS  *BOWEL PROBLEMS  UNUSUAL RASH Items with * indicate a potential emergency and should be followed up as soon as possible.  Feel free to call the clinic you have any questions or concerns. The clinic phone number is [phone].  Zoledronic Acid injection (Hypercalcemia, Oncology) What is this medicine? ZOLEDRONIC ACID (ZOE le dron ik AS id) lowers the amount of calcium loss from bone. It is used to treat too much calcium in your blood from cancer. It is also used to prevent complications of cancer that has spread to the bone. This medicine may be used for other purposes; ask your health care provider or pharmacist if you have questions. What should I tell my health care provider before I take this medicine? They need to know if you have any of these conditions: -aspirin-sensitive asthma -cancer, especially if you are receiving medicines used to treat cancer -dental disease or wear dentures -infection -kidney disease -receiving corticosteroids like dexamethasone or prednisone -an unusual or allergic reaction to zoledronic acid, other medicines, foods, dyes, or preservatives -pregnant or trying to get pregnant -breast-feeding How should I use this medicine? This medicine is  for infusion into a vein. It is given by a health care professional in a hospital or clinic setting. Talk to your pediatrician regarding the use of this medicine in children. Special care may be needed. Overdosage: If you think you have taken too much of this medicine contact a poison control center or emergency room at once. NOTE: This medicine is only for you. Do not share this medicine with others. What if I miss a dose? It is important not to miss your dose. Call your doctor or health care professional if you are unable to keep an appointment. What may interact with this medicine? -certain antibiotics given by injection -NSAIDs, medicines for pain and inflammation, like ibuprofen or naproxen -some diuretics like bumetanide, furosemide -teriparatide -thalidomide This list may not describe all possible interactions. Give your health care provider a list of all the medicines, herbs, non-prescription drugs, or dietary supplements you use. Also tell them if you smoke, drink alcohol, or use illegal drugs. Some items may interact with your medicine. What should I watch for while using this medicine? Visit your doctor or health care professional for regular checkups. It may be some time before you see the benefit from this medicine. Do not stop taking your medicine unless your doctor tells you to. Your doctor may order blood tests or other tests to see how you are doing. Women should inform their doctor if they wish to become pregnant or think they might be pregnant. There is a potential for serious side effects to an unborn child. Talk to your health care professional or pharmacist for more information. You should make sure that you get enough calcium and vitamin D  while you are taking this medicine. Discuss the foods you eat and the vitamins you take with your health care professional. Some people who take this medicine have severe bone, joint, and/or muscle pain. This medicine may also increase your  risk for jaw problems or a broken thigh bone. Tell your doctor right away if you have severe pain in your jaw, bones, joints, or muscles. Tell your doctor if you have any pain that does not go away or that gets worse. Tell your dentist and dental surgeon that you are taking this medicine. You should not have major dental surgery while on this medicine. See your dentist to have a dental exam and fix any dental problems before starting this medicine. Take good care of your teeth while on this medicine. Make sure you see your dentist for regular follow-up appointments. What side effects may I notice from receiving this medicine? Side effects that you should report to your doctor or health care professional as soon as possible: -allergic reactions like skin rash, itching or hives, swelling of the face, lips, or tongue -anxiety, confusion, or depression -breathing problems -changes in vision -eye pain -feeling faint or lightheaded, falls -jaw pain, especially after dental work -mouth sores -muscle cramps, stiffness, or weakness -redness, blistering, peeling or loosening of the skin, including inside the mouth -trouble passing urine or change in the amount of urine Side effects that usually do not require medical attention (report to your doctor or health care professional if they continue or are bothersome): -bone, joint, or muscle pain -constipation -diarrhea -fever -hair loss -irritation at site where injected -loss of appetite -nausea, vomiting -stomach upset -trouble sleeping -trouble swallowing -weak or tired This list may not describe all possible side effects. Call your doctor for medical advice about side effects. You may report side effects to FDA at 1-800-FDA-1088. Where should I keep my medicine? This drug is given in a hospital or clinic and will not be stored at home. NOTE: This sheet is a summary. It may not cover all possible information. If you have questions about this  medicine, talk to your doctor, pharmacist, or health care provider.    2016, [employer]. (2014-03-28 14:19:39)

## 2015-09-07 NOTE — Telephone Encounter (Signed)
Appointments made and avs printed for pateint °

## 2015-09-07 NOTE — Progress Notes (Signed)
Patient Care Team: Mackie Pai, PA-C as PCP - General (Physician Assistant) Thea Silversmith, MD as Consulting Physician (Radiation Oncology) Consuela Mimes, MD as Consulting Physician (Hematology and Oncology) Nicholas Lose, MD as Consulting Physician (Hematology and Oncology)  DIAGNOSIS: Breast cancer metastasized to bone [facility] [place])   Staging form: Breast, AJCC 7th Edition     Pathologic: Stage IIIC (T4d, N3, cM0) - Signed by [name], MD on 01/19/2014   SUMMARY OF ONCOLOGIC HISTORY:   Breast cancer metastasized to bone ([place])   12/07/2010 - 04/12/2011 Neo-Adjuvant Chemotherapy Neoadjuvant FEC x6 followed by Taxotere x4   12/26/2010 Procedure Genetic testing showed mutation for BRCA2 [id number]   05/12/2011 Surgery Bilateral mastectomy with left axillary dissection 11/21 positive lymph nodes 5.8 cm tumor ER 95% PR 0% Ki-67 61% HER-2 negative ratio 1.39 T3, N3, 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   07/03/2011 - 08/17/2011 Radiation Therapy Radiation therapy to the chest and axilla   09/11/2011 -  Anti-estrogen oral therapy Tamoxifen later switched to Arimidex 07/2012   04/22/2012 Surgery Bilateral salpingo-oophorectomy   06/10/2015 Imaging Bone scan: uptake right lateral frontal bone, left malar region, manubrium, right 6,8 ribs; CT-CAP: New extensive infiltrate left & right hilar/mediastinal, right axillary, bil lower neck LN, 3.7 cm LUL consolidation, RML nodules,Lt Pl eff, RPLN   06/18/2015 Initial Biopsy Right axillary lymph node biopsy: 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, ER 90%, PR 0%, Ki-67 60%, HER-2 negative   06/29/2015 -  Chemotherapy Halaven days 1 and 8 Q 3 weeks    CHIEF COMPLIANT: Cycle 4 day 8 Halaven  INTERVAL HISTORY: Tina Vaughan is a 54 year old with above-mentioned history of metastatic breast cancer currently on palliative chemotherapy with Halaven. She appears to be tolerating it fairly well. Her breathing is about the same. She is currently on  omeprazole for acid reflux which appears to be helping her. She requests a refill of that medication today. Denies any nausea vomiting. Her voice is once again slightly hoarse. I suspect it is related to vocal cord fatigue.   REVIEW OF SYSTEMS:   Constitutional: Denies fevers, chills or abnormal weight loss Eyes: Denies blurriness of vision Ears, nose, mouth, throat, and face: Denies mucositis or sore throat Respiratory: Denies cough, dyspnea or wheezes Cardiovascular: Denies palpitation, chest discomfort or lower extremity swelling Gastrointestinal:  Denies nausea, heartburn or change in bowel habits Skin: Denies abnormal skin rashes Lymphatics: Denies new lymphadenopathy or easy bruising Neurological:Denies numbness, tingling or new weaknesses Behavioral/Psych: Mood is stable, no new changes   All other systems were reviewed with the patient and are negative.  I have reviewed the past medical history, past surgical history, social history and family history with the patient and they are unchanged from previous note.  ALLERGIES:  is allergic to doxycycline hydrochloride.  MEDICATIONS:  Current Outpatient Prescriptions  Medication Sig Dispense Refill  . ALPRAZolam (XANAX) 0.5 MG tablet Take 1 tablet (0.5 mg total) by mouth at bedtime as needed for sleep. 30 tablet 0  . anastrozole (ARIMIDEX) 1 MG tablet Take 1 tablet (1 mg total) by mouth daily. 90 tablet 6  . Calcium 200 MG TABS Take 200 mg by mouth daily.     . cholecalciferol (VITAMIN D) 1000 UNITS tablet Take 1,000 Units by mouth daily.     . citalopram (CELEXA) 40 MG tablet TAKE 1 TABLET BY MOUTH DAILY. 30 tablet 5  . clindamycin (CLEOCIN) 150 MG capsule Take 1 capsule (150 mg total) by mouth 3 (three)  times daily. 21 capsule 0  . dexamethasone (DECADRON) 4 MG tablet Take 0.5 tablets (2 mg total) by mouth daily. 30 tablet 1  . fluocinonide cream (LIDEX) 9.32 % Apply 1 application topically 2 (two) times daily as needed (for  rash.).    Marland Kitchen gabapentin (NEURONTIN) 300 MG capsule Take 1 capsule (300 mg total) by mouth daily. 30 capsule 5  . ibuprofen (ADVIL,MOTRIN) 200 MG tablet Take 600 mg by mouth every 8 (eight) hours as needed for moderate pain (pain).     Marland Kitchen lidocaine-prilocaine (EMLA) cream Apply to affected area once (Patient taking differently: Apply 1 application topically daily as needed. For port) 30 g 3  . LORazepam (ATIVAN) 1 MG tablet TAKE 1 TABLET BY MOUTH AT BEDTIME. 30 tablet 0  . naproxen sodium (ANAPROX) 220 MG tablet Take 220-440 mg by mouth 2 (two) times daily as needed (for muscle aches.).     Marland Kitchen omeprazole (PRILOSEC) 20 MG capsule Take 2 x  30-60 min before first meal of the day    . ondansetron (ZOFRAN) 8 MG tablet TAKE 1 TABLET BY MOUTH 2 TIMES DAILY. START THE DAY AFTER CHEMO FOR 2 DAYS. THEN TAKE AS NEEDED FOR NAUSEA OR VOMITING. 30 tablet 1  . prochlorperazine (COMPAZINE) 10 MG tablet Take 1 tablet (10 mg total) by mouth every 6 (six) hours as needed (Nausea or vomiting). 30 tablet 1  . promethazine (PHENERGAN) 25 MG tablet Take 1 tablet (25 mg total) by mouth every 6 (six) hours as needed for nausea. 30 tablet 1  . ranitidine (ZANTAC) 150 MG tablet 2 at bedtime    . SUMAtriptan (IMITREX) 50 MG tablet Take 1 tablet (50 mg total) by mouth every 2 (two) hours as needed for migraine. May repeat in 2 hours if headache persists or recurs. 10 tablet 0  . traMADol (ULTRAM) 50 MG tablet Take 1 tablet (50 mg total) by mouth 3 (three) times daily as needed. (Patient taking differently: Take 50 mg by mouth 3 (three) times daily as needed for moderate pain. ) 30 tablet 0  . UNABLE TO FIND Dispense compression sleeve 20-30 mm for this patient with history of breast cancer s/p surgery 1 each 0   No current facility-administered medications for this visit.    PHYSICAL EXAMINATION: ECOG PERFORMANCE STATUS: 1 - Symptomatic but completely ambulatory  Filed Vitals:   09/07/15 0826  BP: 121/73  Pulse: 106   Temp: 97.5 F (36.4 C)  Resp: 18   Filed Weights   09/07/15 0826  Weight: 210 lb (95.255 kg)    GENERAL:alert, no distress and comfortable SKIN: skin color, texture, turgor are normal, no rashes or significant lesions EYES: normal, Conjunctiva are pink and non-injected, sclera clear OROPHARYNX:no exudate, no erythema and lips, buccal mucosa, and tongue normal  NECK: supple, thyroid normal size, non-tender, without nodularity LYMPH:  no palpable lymphadenopathy in the cervical, axillary or inguinal LUNGS: clear to auscultation and percussion with normal breathing effort HEART: regular rate & rhythm and no murmurs and no lower extremity edema ABDOMEN:abdomen soft, non-tender and normal bowel sounds Musculoskeletal:no cyanosis of digits and no clubbing  NEURO: alert & oriented x 3 with fluent speech, no focal motor/sensory deficits  LABORATORY DATA:  I have reviewed the data as listed   Chemistry      Component Value Date/Time   NA 142 08/31/2015 0851   NA 141 07/31/2015 1929   K 4.1 08/31/2015 0851   K 3.1* 07/31/2015 1929   CL 106 07/31/2015  1929   CL 108* 07/30/2012 1430   CO2 25 08/31/2015 0851   CO2 24 07/31/2015 1929   BUN 9.2 08/31/2015 0851   BUN 8 07/31/2015 1929   CREATININE 0.8 08/31/2015 0851   CREATININE 0.73 07/31/2015 1929      Component Value Date/Time   CALCIUM 9.6 08/31/2015 0851   CALCIUM 9.4 07/31/2015 1929   ALKPHOS 82 08/31/2015 0851   ALKPHOS 90 07/31/2015 1929   AST 17 08/31/2015 0851   AST 31 07/31/2015 1929   ALT 26 08/31/2015 0851   ALT 36 07/31/2015 1929   BILITOT 0.63 08/31/2015 0851   BILITOT 0.8 07/31/2015 1929       Lab Results  Component Value Date   WBC 4.3 09/07/2015   HGB 10.8* 09/07/2015   HCT 30.9* 09/07/2015   MCV 90.5 09/07/2015   PLT 205 09/07/2015   NEUTROABS 2.6 09/07/2015   ASSESSMENT & PLAN:  Breast cancer metastasized to bone [facility]) Right axillary lymph node biopsy 06/18/2015: Metastatic invasive ductal  carcinoma of the breast, ER 90%, PR 0%, 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 BRCA 2 mutation underwent salpingo-oophorectomy Status post bilateral mastectomies and radiation to left chest wall and axilla currently on tamoxifen since October 2012) ------------------------------------------------------------------------------------------------------------------------------------------------------- Treatment plan: Halaven day 1 and day 8 every 3 weeks 6 cycles following which we will consider starting letrozole with Ibrance Current treatment:cycle 4 day 8 Halaven Halaven toxicities: 1. Nausea and dry heaves: In spite of changing antiemetics regimen to Aloxi, patient continues to have persistent nausea and vomiting. We added Emend and oral dexamethasone which completely resolved her nausea. 2. Cough without expectoration 3. Elevation of AST and ALT: We will continue to watch and monitor this. 4. We consulted palliative care. 5. 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 6. Profound decreased appetite: encourage the patient to continue to eat good nutritious diet.  Profound shortness of breath: on Home O2 at bedtime. Patient has seen Dr. Melvyn Novas with pulmonology. He is treating her for acid reflux. Sniff test is normal. I renewed her omeprazole today.  Radiology review: Decrease in thoracic lymphadenopathy, Decrease in Left upper lobe mass 2.5 cm x 3.7 cm to 1.5 cm x 3.4 cm; decrease in upper abdominal LN, sclerotic lesion L4   Bone metastases: She will need  bisphosphonate therapy for suspicious bone metastases. She will get Zometa every 3 months.   Scalp folliculitis: resolved with clindamycin Return to clinic in 2 weeks for platelet count check and cycle 5.  No orders of the defined types were placed in this encounter.   The patient has a good understanding of the overall plan. she agrees with it. she will call with any problems that may develop before the next visit here.   Rulon Eisenmenger, MD 09/07/2015

## 2015-09-09 DIAGNOSIS — H524 Presbyopia: Secondary | ICD-10-CM | POA: Diagnosis not present

## 2015-09-09 DIAGNOSIS — H02729 Madarosis of unspecified eye, unspecified eyelid and periocular area: Secondary | ICD-10-CM | POA: Diagnosis not present

## 2015-09-09 DIAGNOSIS — H52222 Regular astigmatism, left eye: Secondary | ICD-10-CM | POA: Diagnosis not present

## 2015-09-09 DIAGNOSIS — H5213 Myopia, bilateral: Secondary | ICD-10-CM | POA: Diagnosis not present

## 2015-09-10 ENCOUNTER — Ambulatory Visit: Payer: Medicare Other | Admitting: Internal Medicine

## 2015-09-16 ENCOUNTER — Telehealth: Payer: Self-pay | Admitting: Hematology and Oncology

## 2015-09-16 ENCOUNTER — Other Ambulatory Visit: Payer: Self-pay

## 2015-09-16 DIAGNOSIS — C50919 Malignant neoplasm of unspecified site of unspecified female breast: Secondary | ICD-10-CM

## 2015-09-16 DIAGNOSIS — C7951 Secondary malignant neoplasm of bone: Principal | ICD-10-CM

## 2015-09-16 NOTE — Telephone Encounter (Signed)
Patient called and left a message yesterday for a referral to [facility],she had gotten this information from her  Palliative care nurse.  Terri placed a referral but when i called the number they stated that she is a hospitalist.  i have called and left a message for the patient to get more information

## 2015-09-21 ENCOUNTER — Ambulatory Visit (HOSPITAL_BASED_OUTPATIENT_CLINIC_OR_DEPARTMENT_OTHER): Payer: Medicare Other | Admitting: Hematology and Oncology

## 2015-09-21 ENCOUNTER — Other Ambulatory Visit (HOSPITAL_BASED_OUTPATIENT_CLINIC_OR_DEPARTMENT_OTHER): Payer: Medicare Other

## 2015-09-21 ENCOUNTER — Encounter: Payer: Self-pay | Admitting: Hematology and Oncology

## 2015-09-21 ENCOUNTER — Ambulatory Visit: Payer: Medicare Other

## 2015-09-21 ENCOUNTER — Telehealth: Payer: Self-pay | Admitting: Hematology and Oncology

## 2015-09-21 ENCOUNTER — Ambulatory Visit (HOSPITAL_BASED_OUTPATIENT_CLINIC_OR_DEPARTMENT_OTHER): Payer: Medicare Other

## 2015-09-21 ENCOUNTER — Telehealth: Payer: Self-pay | Admitting: *Deleted

## 2015-09-21 VITALS — BP 124/79 | HR 89 | Temp 98.1°F | Resp 18 | Ht 62.0 in | Wt 210.3 lb

## 2015-09-21 VITALS — BP 114/70 | HR 93

## 2015-09-21 DIAGNOSIS — C50919 Malignant neoplasm of unspecified site of unspecified female breast: Secondary | ICD-10-CM

## 2015-09-21 DIAGNOSIS — C778 Secondary and unspecified malignant neoplasm of lymph nodes of multiple regions: Secondary | ICD-10-CM | POA: Diagnosis not present

## 2015-09-21 DIAGNOSIS — C50911 Malignant neoplasm of unspecified site of right female breast: Secondary | ICD-10-CM

## 2015-09-21 DIAGNOSIS — C50912 Malignant neoplasm of unspecified site of left female breast: Secondary | ICD-10-CM

## 2015-09-21 DIAGNOSIS — C7951 Secondary malignant neoplasm of bone: Secondary | ICD-10-CM | POA: Diagnosis not present

## 2015-09-21 LAB — CBC WITH DIFFERENTIAL/PLATELET
BASO%: 0.6 % (ref 0.0–2.0)
Basophils Absolute: 0 10*3/uL (ref 0.0–0.1)
EOS%: 0.2 % (ref 0.0–7.0)
Eosinophils Absolute: 0 10*3/uL (ref 0.0–0.5)
HCT: 35.6 % (ref 34.8–46.6)
HGB: 12 g/dL (ref 11.6–15.9)
LYMPH%: 23.4 % (ref 14.0–49.7)
MCH: 31.9 pg (ref 25.1–34.0)
MCHC: 33.7 g/dL (ref 31.5–36.0)
MCV: 94.9 fL (ref 79.5–101.0)
MONO#: 0.4 10*3/uL (ref 0.1–0.9)
MONO%: 4.5 % (ref 0.0–14.0)
NEUT%: 71.3 % (ref 38.4–76.8)
NEUTROS ABS: 5.5 10*3/uL (ref 1.5–6.5)
PLATELETS: 128 10*3/uL — AB (ref 145–400)
RBC: 3.75 10*6/uL (ref 3.70–5.45)
RDW: 19.1 % — ABNORMAL HIGH (ref 11.2–14.5)
WBC: 7.8 10*3/uL (ref 3.9–10.3)
lymph#: 1.8 10*3/uL (ref 0.9–3.3)

## 2015-09-21 LAB — COMPREHENSIVE METABOLIC PANEL (CC13)
ALT: 18 U/L (ref 0–55)
AST: 17 U/L (ref 5–34)
Albumin: 3.9 g/dL (ref 3.5–5.0)
Alkaline Phosphatase: 80 U/L (ref 40–150)
Anion Gap: 8 mEq/L (ref 3–11)
BILIRUBIN TOTAL: 0.61 mg/dL (ref 0.20–1.20)
BUN: 7.9 mg/dL (ref 7.0–26.0)
CO2: 25 meq/L (ref 22–29)
CREATININE: 0.7 mg/dL (ref 0.6–1.1)
Calcium: 9.3 mg/dL (ref 8.4–10.4)
Chloride: 110 mEq/L — ABNORMAL HIGH (ref 98–109)
EGFR: >90 mL/min/{1.73_m2} (ref >90)
Glucose: 129 mg/dl (ref 70–140)
Potassium: 4 mEq/L (ref 3.5–5.1)
SODIUM: 143 meq/L (ref 136–145)
TOTAL PROTEIN: 6.4 g/dL (ref 6.4–8.3)

## 2015-09-21 MED ORDER — SODIUM CHLORIDE 0.9 % IJ SOLN
10.0000 mL | INTRAMUSCULAR | Status: DC | PRN
  Administered 2015-09-21: 10 mL
  Filled 2015-09-21: qty 10

## 2015-09-21 MED ORDER — ERIBULIN MESYLATE CHEMO INJECTION 1 MG/2ML
1.1900 mg/m2 | Freq: Once | INTRAVENOUS | Status: AC
  Administered 2015-09-21: 2.5 mg via INTRAVENOUS
  Filled 2015-09-21: qty 5

## 2015-09-21 MED ORDER — PALONOSETRON HCL INJECTION 0.25 MG/5ML
0.2500 mg | Freq: Once | INTRAVENOUS | Status: AC
  Administered 2015-09-21: 0.25 mg via INTRAVENOUS

## 2015-09-21 MED ORDER — SODIUM CHLORIDE 0.9 % IV SOLN
Freq: Once | INTRAVENOUS | Status: AC
  Administered 2015-09-21: 11:00:00 via INTRAVENOUS

## 2015-09-21 MED ORDER — HEPARIN SOD (PORK) LOCK FLUSH 100 UNIT/ML IV SOLN
500.0000 [IU] | Freq: Once | INTRAVENOUS | Status: AC | PRN
  Administered 2015-09-21: 500 [IU]
  Filled 2015-09-21: qty 5

## 2015-09-21 MED ORDER — PALONOSETRON HCL INJECTION 0.25 MG/5ML
INTRAVENOUS | Status: AC
  Filled 2015-09-21: qty 5

## 2015-09-21 MED ORDER — SODIUM CHLORIDE 0.9 % IV SOLN
Freq: Once | INTRAVENOUS | Status: AC
  Administered 2015-09-21: 11:00:00 via INTRAVENOUS
  Filled 2015-09-21: qty 5

## 2015-09-21 NOTE — Patient Instructions (Signed)
[facility] Discharge Instructions for Patients Receiving Chemotherapy  Today you received the following chemotherapy agents: Halaven  To help prevent nausea and vomiting after your treatment, we encourage you to take your nausea medication as directed.    If you develop nausea and vomiting that is not controlled by your nausea medication, call the clinic.   BELOW ARE SYMPTOMS THAT SHOULD BE REPORTED IMMEDIATELY:  *FEVER GREATER THAN 100.5 F  *CHILLS WITH OR WITHOUT FEVER  NAUSEA AND VOMITING THAT IS NOT CONTROLLED WITH YOUR NAUSEA MEDICATION  *UNUSUAL SHORTNESS OF BREATH  *UNUSUAL BRUISING OR BLEEDING  TENDERNESS IN MOUTH AND THROAT WITH OR WITHOUT PRESENCE OF ULCERS  *URINARY PROBLEMS  *BOWEL PROBLEMS  UNUSUAL RASH Items with * indicate a potential emergency and should be followed up as soon as possible.  Feel free to call the clinic you have any questions or concerns. The clinic phone number is [phone].  Please show the CHEMO ALERT CARD at check-in to the Emergency Department and triage nurse.   

## 2015-09-21 NOTE — Telephone Encounter (Signed)
Palliative referral faxed to [facility].

## 2015-09-21 NOTE — Assessment & Plan Note (Signed)
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 BRCA 2 mutation underwent salpingo-oophorectomy Status post bilateral mastectomies and radiation to left chest wall and axilla currently on tamoxifen since October 2012) ------------------------------------------------------------------------------------------------------------------------------------------------------- Treatment plan: Halaven day 1 and day 8 every 3 weeks 6 cycles following which we will consider starting letrozole with Ibrance Current treatment:cycle 5 day 1 Halaven Halaven toxicities: 1. Nausea and dry heaves: In spite of changing antiemetics regimen to Aloxi, patient continues to have persistent nausea and vomiting. We added Emend and oral dexamethasone which completely resolved her nausea. 2. Cough without expectoration 3. Elevation of AST and ALT: We will continue to watch and monitor this. 4. We consulted palliative care. 5. 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 6. Profound decreased appetite: encourage the patient to continue to eat good nutritious diet.  Profound shortness of breath: on Home O2 at bedtime. Patient has seen Dr. Wert with pulmonology. He is treating her for acid reflux. Sniff test is normal. I renewed her omeprazole today.  Radiology review (after 3 cycles Halaven): Decrease in thoracic lymphadenopathy, Decrease in Left upper lobe mass 2.5 cm x 3.7 cm to 1.5 cm  x 3.4 cm; decrease in upper abdominal LN, sclerotic lesion L4   Bone metastases: Zometa every 3 months.  Scalp folliculitis: resolved with clindamycin Return to clinic in 3 weeks for platelet count check and cycle 6. 

## 2015-09-21 NOTE — Progress Notes (Signed)
Patient Care Team: Mackie Pai, PA-C as PCP - General (Physician Assistant) Thea Silversmith, MD as Consulting Physician (Radiation Oncology) Consuela Mimes, MD as Consulting Physician (Hematology and Oncology) Nicholas Lose, MD as Consulting Physician (Hematology and Oncology)  DIAGNOSIS: Breast cancer metastasized to bone [facility])   Staging form: Breast, AJCC 7th Edition     Pathologic: Stage IIIC (T4d, N3, cM0) - Signed by [name], MD on 01/19/2014   SUMMARY OF ONCOLOGIC HISTORY:   Breast cancer metastasized to bone ([place])   12/07/2010 - 04/12/2011 Neo-Adjuvant Chemotherapy Neoadjuvant FEC x6 followed by Taxotere x4   12/26/2010 Procedure Genetic testing showed mutation for BRCA2 [id number]   05/12/2011 Surgery Bilateral mastectomy with left axillary dissection 11/21 positive lymph nodes 5.8 cm tumor ER 95% PR 0% Ki-67 61% HER-2 negative ratio 1.39 T3, N3, 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   07/03/2011 - 08/17/2011 Radiation Therapy Radiation therapy to the chest and axilla   09/11/2011 -  Anti-estrogen oral therapy Tamoxifen later switched to Arimidex 07/2012   04/22/2012 Surgery Bilateral salpingo-oophorectomy   06/10/2015 Imaging Bone scan: uptake right lateral frontal bone, left malar region, manubrium, right 6,8 ribs; CT-CAP: New extensive infiltrate left & right hilar/mediastinal, right axillary, bil lower neck LN, 3.7 cm LUL consolidation, RML nodules,Lt Pl eff, RPLN   06/18/2015 Initial Biopsy Right axillary lymph node biopsy: 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 She has felt a lot of tiredness related to chemotherapy which is making her cranky and irritable.  She has poor appetite and lack of taste is also been bothering her. She feels like a big lump next to her throat making it difficult to breathe.her energy levels have not recovered up until the last couple of days.  REVIEW OF SYSTEMS:   Constitutional: Denies fevers, chills or abnormal weight loss Eyes: Denies blurriness of vision Ears, nose, mouth, throat, and face: feels like a lump in the throat Respiratory: shortness of breath Cardiovascular: Denies palpitation, chest discomfort or lower extremity swelling Gastrointestinal:  Denies nausea, heartburn or change in bowel habits Skin: Denies abnormal skin rashes Lymphatics: Denies new lymphadenopathy or easy bruising Neurological:Denies numbness, tingling or new weaknesses Behavioral/Psych: Mood is stable, no new changes   All other systems were reviewed with the patient and are negative.  I have reviewed the past medical history, past surgical history, social history and family history with the patient and they are unchanged from previous note.  ALLERGIES:  is allergic to doxycycline hydrochloride.  MEDICATIONS:  Current Outpatient Prescriptions  Medication Sig Dispense Refill  . ALPRAZolam (XANAX) 0.5 MG tablet Take 1 tablet (0.5 mg total) by mouth at bedtime as needed for sleep. 30 tablet 0  . anastrozole (ARIMIDEX) 1 MG tablet Take 1 tablet (1 mg total) by mouth daily. 90 tablet 6  . Calcium 200 MG TABS Take 200 mg by mouth daily.     . cholecalciferol (VITAMIN D) 1000 UNITS tablet Take 1,000 Units by mouth daily.     . citalopram (CELEXA) 40 MG tablet TAKE 1 TABLET BY MOUTH DAILY. 30 tablet 5  . clindamycin (CLEOCIN) 150 MG capsule Take 1 capsule (150 mg total) by mouth 3 (  three) times daily. 21 capsule 0  . dexamethasone (DECADRON) 4 MG tablet Take 0.5 tablets (2 mg total) by mouth daily. 30 tablet 1  . fluocinonide cream (LIDEX) 7.32 % Apply 1 application topically 2 (two) times daily as needed (for rash.).    Marland Kitchen gabapentin  (NEURONTIN) 300 MG capsule Take 1 capsule (300 mg total) by mouth daily. 30 capsule 5  . ibuprofen (ADVIL,MOTRIN) 200 MG tablet Take 600 mg by mouth every 8 (eight) hours as needed for moderate pain (pain).     Marland Kitchen lidocaine-prilocaine (EMLA) cream Apply to affected area once (Patient taking differently: Apply 1 application topically daily as needed. For port) 30 g 3  . LORazepam (ATIVAN) 1 MG tablet TAKE 1 TABLET BY MOUTH AT BEDTIME. 30 tablet 0  . naproxen sodium (ANAPROX) 220 MG tablet Take 220-440 mg by mouth 2 (two) times daily as needed (for muscle aches.).     Marland Kitchen omeprazole (PRILOSEC) 20 MG capsule Take 1 capsule (20 mg total) by mouth daily. Take 2 x  30-60 min before first meal of the day 30 capsule 3  . ondansetron (ZOFRAN) 8 MG tablet TAKE 1 TABLET BY MOUTH 2 TIMES DAILY. START THE DAY AFTER CHEMO FOR 2 DAYS. THEN TAKE AS NEEDED FOR NAUSEA OR VOMITING. 30 tablet 1  . prochlorperazine (COMPAZINE) 10 MG tablet Take 1 tablet (10 mg total) by mouth every 6 (six) hours as needed (Nausea or vomiting). 30 tablet 1  . promethazine (PHENERGAN) 25 MG tablet Take 1 tablet (25 mg total) by mouth every 6 (six) hours as needed for nausea. 30 tablet 1  . ranitidine (ZANTAC) 150 MG tablet 2 at bedtime    . SUMAtriptan (IMITREX) 50 MG tablet Take 1 tablet (50 mg total) by mouth every 2 (two) hours as needed for migraine. May repeat in 2 hours if headache persists or recurs. 10 tablet 0  . traMADol (ULTRAM) 50 MG tablet Take 1 tablet (50 mg total) by mouth 3 (three) times daily as needed. (Patient taking differently: Take 50 mg by mouth 3 (three) times daily as needed for moderate pain. ) 30 tablet 0  . UNABLE TO FIND Dispense compression sleeve 20-30 mm for this patient with history of breast cancer s/p surgery 1 each 0   No current facility-administered medications for this visit.    PHYSICAL EXAMINATION: ECOG PERFORMANCE STATUS: 1 - Symptomatic but completely ambulatory  Filed Vitals:   09/21/15 0838    BP: 124/79  Pulse: 89  Temp: 98.1 F (36.7 C)  Resp: 18   Filed Weights   09/21/15 0838  Weight: 210 lb 4.8 oz (95.391 kg)    GENERAL:alert, no distress and comfortable SKIN: skin color, texture, turgor are normal, no rashes or significant lesions EYES: normal, Conjunctiva are pink and non-injected, sclera clear OROPHARYNX:no exudate, no erythema and lips, buccal mucosa, and tongue normal  NECK: fullness of the left side of the neck LYMPH:  no palpable lymphadenopathy in the cervical, axillary or inguinal LUNGS: clear to auscultation and percussion with normal breathing effort HEART: tachycardia ABDOMEN:abdomen soft, non-tender and normal bowel sounds Musculoskeletal:no cyanosis of digits and no clubbing  NEURO: alert & oriented x 3 with fluent speech, no focal motor/sensory deficits   LABORATORY DATA:  I have reviewed the data as listed   Chemistry      Component Value Date/Time   NA 144 09/07/2015 0810   NA 141 07/31/2015 1929   K 3.6 09/07/2015 0810   K 3.1* 07/31/2015 1929  CL 106 07/31/2015 1929   CL 108* 07/30/2012 1430   CO2 28 09/07/2015 0810   CO2 24 07/31/2015 1929   BUN 8.9 09/07/2015 0810   BUN 8 07/31/2015 1929   CREATININE 0.7 09/07/2015 0810   CREATININE 0.73 07/31/2015 1929      Component Value Date/Time   CALCIUM 9.7 09/07/2015 0810   CALCIUM 9.4 07/31/2015 1929   ALKPHOS 62 09/07/2015 0810   ALKPHOS 90 07/31/2015 1929   AST 20 09/07/2015 0810   AST 31 07/31/2015 1929   ALT 34 09/07/2015 0810   ALT 36 07/31/2015 1929   BILITOT 0.61 09/07/2015 0810   BILITOT 0.8 07/31/2015 1929       Lab Results  Component Value Date   WBC 7.8 09/21/2015   HGB 12.0 09/21/2015   HCT 35.6 09/21/2015   MCV 94.9 09/21/2015   PLT 128* 09/21/2015   NEUTROABS 5.5 09/21/2015    ASSESSMENT & PLAN:  Breast cancer metastasized to bone [facility]) Right axillary lymph node biopsy 06/18/2015: 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, ER 90%, PR 0%, Ki-67  60%, HER-2 negative Bone scan 06/10/2015: uptake right lateral frontal bone, left malar region, manubrium, right 6,8 ribs;  CT-CAP: New extensive infiltrate left & right hilar/mediastinal, right axillary, bil lower neck LN, 3.7 cm LUL consolidation, RML nodules,Lt Pl eff, RPLN (Left breast inflammatory breast cancer T4d, N3, M0 stage IIIc grade 2 left 21 positive lymph nodes, 5.8 cm tumor ER 95% PR 0% Ki-67 61% HER-2 negative. BRCA 2 mutation underwent salpingo-oophorectomy Status post bilateral mastectomies and radiation to left chest wall and axilla currently on tamoxifen since October 2012) ------------------------------------------------------------------------------------------------------------------------------------------------------- Treatment plan: Halaven day 1 and day 8 every 3 weeks 6 cycles following which we will consider starting letrozole with Ibrance Current treatment:cycle 5 day 1 Halaven Halaven toxicities: 1. Nausea and dry heaves: In spite of changing antiemetics regimen to Aloxi, patient continues to have persistent nausea and vomiting. We added Emend and oral dexamethasone which completely resolved her nausea. 2. Cough without expectoration 3. Elevation of AST and ALT: We will continue to watch and monitor this. 4. We consulted palliative care. 5. 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 6. Profound decreased appetite: encourage the patient to continue to eat good nutritious diet. 7. Severe fatigue: Patient is having increased fatigue and emotional problems, I will reduce the dosage of her chemotherapy with cycle 5-1.2 mg/m.  Profound shortness of breath: on Home O2 at bedtime. Patient has seen Dr. Melvyn Novas with pulmonology. He is treating her for acid reflux. Sniff test is normal. I renewed her omeprazole today.  Radiology review (after 3 cycles Halaven): Decrease in thoracic lymphadenopathy, Decrease in Left upper lobe  mass 2.5 cm x 3.7 cm to 1.5 cm x 3.4 cm; decrease in upper abdominal LN, sclerotic lesion L4   Bone metastases: Zometa once a month Scalp folliculitis: resolved with clindamycin Return to clinic in 1 week   No orders of the defined types were placed in this encounter.   The patient has a good understanding of the overall plan. she agrees with it. she will call with any problems that may develop before the next visit here.   Rulon Eisenmenger, MD 09/21/2015

## 2015-09-21 NOTE — Progress Notes (Signed)
1040: Clarified Fluid orders with Dr. Lindi Adie. Pt to receive 524mls over one hour per Dr. Lindi Adie.

## 2015-09-21 NOTE — Telephone Encounter (Signed)
Appointments made and avs printed for patient °

## 2015-09-27 DIAGNOSIS — R531 Weakness: Secondary | ICD-10-CM | POA: Diagnosis not present

## 2015-09-28 ENCOUNTER — Telehealth: Payer: Self-pay | Admitting: Hematology and Oncology

## 2015-09-28 ENCOUNTER — Ambulatory Visit (HOSPITAL_BASED_OUTPATIENT_CLINIC_OR_DEPARTMENT_OTHER): Payer: Medicare Other

## 2015-09-28 ENCOUNTER — Other Ambulatory Visit: Payer: Medicare Other

## 2015-09-28 ENCOUNTER — Ambulatory Visit (HOSPITAL_BASED_OUTPATIENT_CLINIC_OR_DEPARTMENT_OTHER): Payer: Medicare Other | Admitting: Hematology and Oncology

## 2015-09-28 ENCOUNTER — Other Ambulatory Visit (HOSPITAL_BASED_OUTPATIENT_CLINIC_OR_DEPARTMENT_OTHER): Payer: Medicare Other

## 2015-09-28 ENCOUNTER — Encounter: Payer: Self-pay | Admitting: Hematology and Oncology

## 2015-09-28 VITALS — BP 117/78 | HR 91 | Temp 97.8°F | Resp 18 | Ht 62.0 in | Wt 209.6 lb

## 2015-09-28 DIAGNOSIS — C7951 Secondary malignant neoplasm of bone: Secondary | ICD-10-CM | POA: Diagnosis not present

## 2015-09-28 DIAGNOSIS — C50911 Malignant neoplasm of unspecified site of right female breast: Secondary | ICD-10-CM

## 2015-09-28 DIAGNOSIS — D6959 Other secondary thrombocytopenia: Secondary | ICD-10-CM | POA: Diagnosis not present

## 2015-09-28 DIAGNOSIS — C778 Secondary and unspecified malignant neoplasm of lymph nodes of multiple regions: Secondary | ICD-10-CM

## 2015-09-28 DIAGNOSIS — R630 Anorexia: Secondary | ICD-10-CM | POA: Diagnosis not present

## 2015-09-28 DIAGNOSIS — Z5189 Encounter for other specified aftercare: Secondary | ICD-10-CM | POA: Diagnosis not present

## 2015-09-28 DIAGNOSIS — R0602 Shortness of breath: Secondary | ICD-10-CM | POA: Diagnosis not present

## 2015-09-28 DIAGNOSIS — C50912 Malignant neoplasm of unspecified site of left female breast: Secondary | ICD-10-CM

## 2015-09-28 DIAGNOSIS — R05 Cough: Secondary | ICD-10-CM

## 2015-09-28 DIAGNOSIS — R530 Neoplastic (malignant) related fatigue: Secondary | ICD-10-CM

## 2015-09-28 DIAGNOSIS — C50919 Malignant neoplasm of unspecified site of unspecified female breast: Secondary | ICD-10-CM

## 2015-09-28 LAB — COMPREHENSIVE METABOLIC PANEL (CC13)
ALBUMIN: 3.9 g/dL (ref 3.5–5.0)
ALK PHOS: 56 U/L (ref 40–150)
ALT: 26 U/L (ref 0–55)
AST: 18 U/L (ref 5–34)
Anion Gap: 8 mEq/L (ref 3–11)
BUN: 8 mg/dL (ref 7.0–26.0)
CALCIUM: 10.3 mg/dL (ref 8.4–10.4)
CHLORIDE: 106 meq/L (ref 98–109)
CO2: 28 mEq/L (ref 22–29)
CREATININE: 0.7 mg/dL (ref 0.6–1.1)
EGFR: >90 mL/min/{1.73_m2} (ref >90)
Glucose: 104 mg/dl (ref 70–140)
Potassium: 4.3 mEq/L (ref 3.5–5.1)
Sodium: 143 mEq/L (ref 136–145)
TOTAL PROTEIN: 6.3 g/dL — AB (ref 6.4–8.3)
Total Bilirubin: 0.6 mg/dL (ref 0.20–1.20)

## 2015-09-28 LAB — CBC WITH DIFFERENTIAL/PLATELET
BASO%: 0.8 % (ref 0.0–2.0)
Basophils Absolute: 0 10*3/uL (ref 0.0–0.1)
EOS%: 0.2 % (ref 0.0–7.0)
Eosinophils Absolute: 0 10*3/uL (ref 0.0–0.5)
HEMATOCRIT: 32 % — AB (ref 34.8–46.6)
HEMOGLOBIN: 10.8 g/dL — AB (ref 11.6–15.9)
LYMPH#: 1.5 10*3/uL (ref 0.9–3.3)
LYMPH%: 30 % (ref 14.0–49.7)
MCH: 32 pg (ref 25.1–34.0)
MCHC: 33.8 g/dL (ref 31.5–36.0)
MCV: 94.7 fL (ref 79.5–101.0)
MONO#: 0.2 10*3/uL (ref 0.1–0.9)
MONO%: 4.3 % (ref 0.0–14.0)
NEUT%: 64.7 % (ref 38.4–76.8)
NEUTROS ABS: 3.1 10*3/uL (ref 1.5–6.5)
Platelets: 162 10*3/uL (ref 145–400)
RBC: 3.38 10*6/uL — ABNORMAL LOW (ref 3.70–5.45)
RDW: 16.6 % — AB (ref 11.2–14.5)
WBC: 4.9 10*3/uL (ref 3.9–10.3)

## 2015-09-28 MED ORDER — HEPARIN SOD (PORK) LOCK FLUSH 100 UNIT/ML IV SOLN
500.0000 [IU] | Freq: Once | INTRAVENOUS | Status: AC | PRN
  Administered 2015-09-28: 500 [IU]
  Filled 2015-09-28: qty 5

## 2015-09-28 MED ORDER — PALONOSETRON HCL INJECTION 0.25 MG/5ML
INTRAVENOUS | Status: AC
  Filled 2015-09-28: qty 5

## 2015-09-28 MED ORDER — PEGFILGRASTIM 6 MG/0.6ML ~~LOC~~ PSKT
6.0000 mg | PREFILLED_SYRINGE | Freq: Once | SUBCUTANEOUS | Status: AC
  Administered 2015-09-28: 6 mg via SUBCUTANEOUS
  Filled 2015-09-28: qty 0.6

## 2015-09-28 MED ORDER — PALONOSETRON HCL INJECTION 0.25 MG/5ML
0.2500 mg | Freq: Once | INTRAVENOUS | Status: AC
  Administered 2015-09-28: 0.25 mg via INTRAVENOUS

## 2015-09-28 MED ORDER — SODIUM CHLORIDE 0.9 % IJ SOLN
10.0000 mL | INTRAMUSCULAR | Status: DC | PRN
  Administered 2015-09-28: 10 mL
  Filled 2015-09-28: qty 10

## 2015-09-28 MED ORDER — SODIUM CHLORIDE 0.9 % IV SOLN
Freq: Once | INTRAVENOUS | Status: AC
  Administered 2015-09-28: 10:00:00 via INTRAVENOUS
  Filled 2015-09-28: qty 5

## 2015-09-28 MED ORDER — SODIUM CHLORIDE 0.9 % IV SOLN
Freq: Once | INTRAVENOUS | Status: AC
  Administered 2015-09-28: 10:00:00 via INTRAVENOUS

## 2015-09-28 MED ORDER — SODIUM CHLORIDE 0.9 % IV SOLN
1.1900 mg/m2 | Freq: Once | INTRAVENOUS | Status: AC
  Administered 2015-09-28: 2.5 mg via INTRAVENOUS
  Filled 2015-09-28: qty 5

## 2015-09-28 NOTE — Assessment & Plan Note (Signed)
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 BRCA 2 mutation underwent salpingo-oophorectomy Status post bilateral mastectomies and radiation to left chest wall and axilla currently on tamoxifen since October 2012) ------------------------------------------------------------------------------------------------------------------------------------------------------- Treatment plan: Halaven day 1 and day 8 every 3 weeks 6 cycles following which we will consider starting letrozole with Ibrance Current treatment:cycle 5 day 8 Halaven Halaven toxicities: 1. Nausea and dry heaves: Aloxi, Emend and oral dexamethasone helped resolve her nausea. 2. Cough without expectoration 3. Elevation of AST and ALT: We will continue to watch and monitor this. 4. We consulted palliative care. 5. 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 6. Profound decreased appetite: encourage the patient to continue to eat good nutritious diet. 7. Severe fatigue: Patient is having increased fatigue and emotional problems,we reduced the dosage of her chemotherapy with cycle 5-1.2 mg/m.  Profound shortness of breath: on Home O2 at bedtime. Patient has seen Dr. Melvyn Novas with pulmonology. He is treating her for acid reflux. Sniff test is normal. I renewed her omeprazole today.  Radiology review (after 3 cycles Halaven): Decrease in thoracic lymphadenopathy, Decrease in Left upper lobe mass 2.5  cm x 3.7 cm to 1.5 cm x 3.4 cm; decrease in upper abdominal LN, sclerotic lesion L4   Bone metastases: Zometa once a month Scalp folliculitis: resolved with clindamycin Return to clinic in 2 weeks for cycle 6

## 2015-09-28 NOTE — Progress Notes (Signed)
915 - Call received from [facility], [place] NP with [facility].  He reports patient is having a major problem with depression and is on the max dose of citalopram.  He would recommend adjuvant Wellbutrin and wants to coordinate care with Dr. Lindi Adie.  He also recommends counseling.  Per Dr. Lindi Adie, Wellbutrin is fine and we will put in a referral to Social Work and for counseling - notified Josh.  Referrals entered.

## 2015-09-28 NOTE — Telephone Encounter (Signed)
Talked with terr f about the referral and she states the patient may decline a clinic.psych appt due to meeting with hospice/social work.  I will advise patient that if she chooses to do this then she will call whomever she pleases due to intake questions.

## 2015-09-28 NOTE — Addendum Note (Signed)
Addended by: Prentiss Bells on: 09/28/2015 10:04 AM   Modules accepted: Orders, Medications

## 2015-09-28 NOTE — Patient Instructions (Signed)
[facility] Discharge Instructions for Patients Receiving Chemotherapy  Today you received the following chemotherapy agents: Halaven  To help prevent nausea and vomiting after your treatment, we encourage you to take your nausea medication as directed.    If you develop nausea and vomiting that is not controlled by your nausea medication, call the clinic.   BELOW ARE SYMPTOMS THAT SHOULD BE REPORTED IMMEDIATELY:  *FEVER GREATER THAN 100.5 F  *CHILLS WITH OR WITHOUT FEVER  NAUSEA AND VOMITING THAT IS NOT CONTROLLED WITH YOUR NAUSEA MEDICATION  *UNUSUAL SHORTNESS OF BREATH  *UNUSUAL BRUISING OR BLEEDING  TENDERNESS IN MOUTH AND THROAT WITH OR WITHOUT PRESENCE OF ULCERS  *URINARY PROBLEMS  *BOWEL PROBLEMS  UNUSUAL RASH Items with * indicate a potential emergency and should be followed up as soon as possible.  Feel free to call the clinic you have any questions or concerns. The clinic phone number is [phone].  Please show the CHEMO ALERT CARD at check-in to the Emergency Department and triage nurse.   

## 2015-09-28 NOTE — Progress Notes (Signed)
Patient Care Team: Mackie Pai, PA-C as PCP - General (Physician Assistant) Thea Silversmith, MD as Consulting Physician (Radiation Oncology) Consuela Mimes, MD as Consulting Physician (Hematology and Oncology) Nicholas Lose, MD as Consulting Physician (Hematology and Oncology)  DIAGNOSIS: Breast cancer metastasized to bone [facility])   Staging form: Breast, AJCC 7th Edition     Pathologic: Stage IIIC (T4d, N3, cM0) - Signed by [name], MD on 01/19/2014   SUMMARY OF ONCOLOGIC HISTORY:   Breast cancer metastasized to bone ([place])   12/07/2010 - 04/12/2011 Neo-Adjuvant Chemotherapy Neoadjuvant FEC x6 followed by Taxotere x4   12/26/2010 Procedure Genetic testing showed mutation for BRCA2 2041insA   05/12/2011 Surgery Bilateral mastectomy with left axillary dissection 11/21 positive lymph nodes 5.8 cm tumor ER 95% PR 0% Ki-67 61% HER-2 negative ratio 1.39 T3, N3, 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   07/03/2011 - 08/17/2011 Radiation Therapy Radiation therapy to the chest and axilla   09/11/2011 -  Anti-estrogen oral therapy Tamoxifen later switched to Arimidex 07/2012   04/22/2012 Surgery Bilateral salpingo-oophorectomy   06/10/2015 Imaging Bone scan: uptake right lateral frontal bone, left malar region, manubrium, right 6,8 ribs; CT-CAP: New extensive infiltrate left & right hilar/mediastinal, right axillary, bil lower neck LN, 3.7 cm LUL consolidation, RML nodules,Lt Pl eff, RPLN   06/18/2015 Initial Biopsy Right axillary lymph node biopsy: 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 She is here for cycle 5 day 8 of treatment. Her major complaints are related to shortness of breath, decreased appetite, severe fatigue. Denies any neuropathy.  With the reduced dose of chemotherapy her fatigue and shortness of breath has slightly improved.  REVIEW OF SYSTEMS:   Constitutional: Denies fevers, chills or abnormal weight loss, complains of fatigue Eyes: Denies blurriness of vision Ears, nose, mouth, throat, and face: Denies mucositis or sore throat Respiratory: shortness of breath with minimal exertion Cardiovascular: Denies palpitation, chest discomfort or lower extremity swelling Gastrointestinal:  Denies nausea, heartburn or change in bowel habits Skin: Denies abnormal skin rashes Lymphatics: Denies new lymphadenopathy or easy bruising Neurological:Denies numbness, tingling or new weaknesses Behavioral/Psych: Mood is stable, no new changes  All other systems were reviewed with the patient and are negative.  I have reviewed the past medical history, past surgical history, social history and family history with the patient and they are unchanged from previous note.  ALLERGIES:  is allergic to doxycycline hydrochloride.  MEDICATIONS:  Current Outpatient Prescriptions  Medication Sig Dispense Refill  . Calcium 200 MG TABS Take 200 mg by mouth daily.     . cholecalciferol (VITAMIN D) 1000 UNITS tablet Take 1,000 Units by mouth daily.     . citalopram (CELEXA) 40 MG tablet TAKE 1 TABLET BY MOUTH DAILY. 30 tablet 5  . dexamethasone (DECADRON) 4 MG tablet Take 0.5 tablets (2 mg total) by mouth daily. 30 tablet 1  . fluocinonide cream (LIDEX) 0.62 % Apply 1 application topically 2 (two) times daily as needed (for rash.).    Marland Kitchen gabapentin (NEURONTIN) 300 MG capsule  Take 1 capsule (300 mg total) by mouth daily. 30 capsule 5  . ibuprofen (ADVIL,MOTRIN) 200 MG tablet Take 600 mg by mouth every 8 (eight) hours as  needed for moderate pain (pain).     Marland Kitchen lidocaine-prilocaine (EMLA) cream Apply to affected area once (Patient taking differently: Apply 1 application topically daily as needed. For port) 30 g 3  . LORazepam (ATIVAN) 1 MG tablet TAKE 1 TABLET BY MOUTH AT BEDTIME. 30 tablet 0  . naproxen sodium (ANAPROX) 220 MG tablet Take 220-440 mg by mouth 2 (two) times daily as needed (for muscle aches.).     Marland Kitchen omeprazole (PRILOSEC) 20 MG capsule Take 1 capsule (20 mg total) by mouth daily. Take 2 x  30-60 min before first meal of the day 30 capsule 3  . ondansetron (ZOFRAN) 8 MG tablet TAKE 1 TABLET BY MOUTH 2 TIMES DAILY. START THE DAY AFTER CHEMO FOR 2 DAYS. THEN TAKE AS NEEDED FOR NAUSEA OR VOMITING. 30 tablet 1  . ranitidine (ZANTAC) 150 MG tablet 2 at bedtime    . UNABLE TO FIND Dispense compression sleeve 20-30 mm for this patient with history of breast cancer s/p surgery 1 each 0   No current facility-administered medications for this visit.    PHYSICAL EXAMINATION: ECOG PERFORMANCE STATUS: 1 - Symptomatic but completely ambulatory  Filed Vitals:   09/28/15 0844  BP: 117/78  Pulse: 91  Temp: 97.8 F (36.6 C)  Resp: 18   Filed Weights   09/28/15 0844  Weight: 209 lb 9.6 oz (95.074 kg)    GENERAL:alert, no distress and comfortable SKIN: skin color, texture, turgor are normal, no rashes or significant lesions EYES: normal, Conjunctiva are pink and non-injected, sclera clear OROPHARYNX:no exudate, no erythema and lips, buccal mucosa, and tongue normal  NECK: supple, thyroid normal size, non-tender, without nodularity LYMPH:  no palpable lymphadenopathy in the cervical, axillary or inguinal LUNGS: diminished breath sounds at the right lung base HEART: regular rate & rhythm and no murmurs and no lower extremity edema ABDOMEN:abdomen soft, non-tender and normal bowel sounds Musculoskeletal:no cyanosis of digits and no clubbing  NEURO: alert & oriented x 3 with fluent speech, no focal  motor/sensory deficits   LABORATORY DATA:  I have reviewed the data as listed   Chemistry      Component Value Date/Time   NA 143 09/28/2015 0822   NA 141 07/31/2015 1929   K 4.3 09/28/2015 0822   K 3.1* 07/31/2015 1929   CL 106 07/31/2015 1929   CL 108* 07/30/2012 1430   CO2 28 09/28/2015 0822   CO2 24 07/31/2015 1929   BUN 8.0 09/28/2015 0822   BUN 8 07/31/2015 1929   CREATININE 0.7 09/28/2015 0822   CREATININE 0.73 07/31/2015 1929      Component Value Date/Time   CALCIUM 10.3 09/28/2015 0822   CALCIUM 9.4 07/31/2015 1929   ALKPHOS 56 09/28/2015 0822   ALKPHOS 90 07/31/2015 1929   AST 18 09/28/2015 0822   AST 31 07/31/2015 1929   ALT 26 09/28/2015 0822   ALT 36 07/31/2015 1929   BILITOT 0.60 09/28/2015 0822   BILITOT 0.8 07/31/2015 1929       Lab Results  Component Value Date   WBC 4.9 09/28/2015   HGB 10.8* 09/28/2015   HCT 32.0* 09/28/2015   MCV 94.7 09/28/2015   PLT 162 09/28/2015   NEUTROABS 3.1 09/28/2015    ASSESSMENT & PLAN:  Breast cancer metastasized to bone [facility]) Right axillary lymph node biopsy  06/18/2015: 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 BRCA 2 mutation underwent salpingo-oophorectomy Status post bilateral mastectomies and radiation to left chest wall and axilla currently on tamoxifen since October 2012) ------------------------------------------------------------------------------------------------------------------------------------------------------- Treatment plan: Halaven day 1 and  day 8 every 3 weeks 6 cycles following which we will consider starting letrozole with Ibrance Current treatment:cycle 5 day 8 Halaven Halaven toxicities: 1. Nausea and dry heaves: Aloxi, Emend and oral dexamethasone helped resolve her nausea. 2. Cough without expectoration 3. Elevation of AST and ALT: We will continue to watch and monitor this. 4. We consulted palliative care. 5. 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 6. Profound decreased appetite: encourage the patient to continue to eat good nutritious diet. 7. Severe fatigue: Patient is having increased fatigue and emotional problems,we reduced the dosage of her chemotherapy with cycle 5-1.2 mg/m.  Profound shortness of breath: on Home O2 at bedtime. Patient has seen Dr. Melvyn Novas with pulmonology. He is treating her for acid reflux. Sniff test is normal. I renewed her omeprazole today.  Radiology review (after 3 cycles Halaven): Decrease in thoracic lymphadenopathy, Decrease in Left upper lobe mass 2.5 cm x 3.7 cm to 1.5 cm x 3.4 cm; decrease in upper abdominal LN, sclerotic lesion L4   Bone metastases: Zometa once a month Scalp folliculitis: resolved with clindamycin Return to clinic in 2 weeks for cycle 6 PET CT has been ordered for December 19 , which will be at the conclusion of chemotherapy   Orders Placed This Encounter  Procedures  . NM PET Image Restag (PS) Skull Base To Thigh    Standing Status: Future     Number of Occurrences:      Standing Expiration Date: 09/27/2016    Order Specific Question:  Reason for Exam (SYMPTOM  OR DIAGNOSIS REQUIRED)    Answer:  restaging metastatic breast cancer    Order Specific Question:  Is the patient pregnant?    Answer:  No    Order Specific Question:  Preferred imaging location?    Answer:  [facility]    Order Specific Question:  If indicated for the ordered procedure, I authorize the administration of a  radiopharmaceutical per Radiology protocol    Answer:  Yes   The patient has a good understanding of the overall plan. she agrees with it. she will call with any problems that may develop before the next visit here.   Rulon Eisenmenger, MD 09/28/2015

## 2015-09-29 ENCOUNTER — Encounter: Payer: Self-pay | Admitting: *Deleted

## 2015-09-29 NOTE — Progress Notes (Signed)
[facility]  Clinical Social Work was referred by Futures trader for assessment of psychosocial needs.  Clinical Social Worker met with patient in the infusion room at [facility] to offer support and assess for needs.  CSW had previously assisted physician in referring patient to the palliative care associates through Hospice.  Since that referral patient has expressed depressed feeling and interest in emotional support.  Patient was tearful when discussing emotional support and resources available.  Palliative care associates has referred patient to their Clinical Social Worker for emotional support.  CSW offered additional support and encouraged patient to explore all counseling options offered through hospice.  Patient was agreeable and very interested in counseling through hospice.  Patient is scheduled to meet with palliative/hospice social worker this week.  CSW encouraged patient to call if she has any questions or needs additional support.     Johnnye Lana, MSW, LCSW, OSW-C Clinical Social Worker [facility] [phone]

## 2015-10-04 ENCOUNTER — Other Ambulatory Visit: Payer: Self-pay | Admitting: *Deleted

## 2015-10-06 ENCOUNTER — Encounter: Payer: Self-pay | Admitting: *Deleted

## 2015-10-06 NOTE — Progress Notes (Signed)
Received notes from palliative care, sent to scan.

## 2015-10-09 ENCOUNTER — Other Ambulatory Visit: Payer: Self-pay | Admitting: Hematology and Oncology

## 2015-10-11 ENCOUNTER — Other Ambulatory Visit: Payer: Self-pay

## 2015-10-11 DIAGNOSIS — C50919 Malignant neoplasm of unspecified site of unspecified female breast: Secondary | ICD-10-CM

## 2015-10-11 DIAGNOSIS — C7951 Secondary malignant neoplasm of bone: Principal | ICD-10-CM

## 2015-10-11 MED ORDER — LORAZEPAM 1 MG PO TABS: 1.0000 mg | ORAL_TABLET | Freq: Every day | ORAL | Status: DC

## 2015-10-11 MED ORDER — LORAZEPAM 1 MG PO TABS
1.0000 mg | ORAL_TABLET | Freq: Every day | ORAL | Status: DC
Start: 2015-10-11 — End: 2015-10-12

## 2015-10-12 ENCOUNTER — Ambulatory Visit (HOSPITAL_BASED_OUTPATIENT_CLINIC_OR_DEPARTMENT_OTHER): Payer: Medicare Other

## 2015-10-12 ENCOUNTER — Other Ambulatory Visit (HOSPITAL_BASED_OUTPATIENT_CLINIC_OR_DEPARTMENT_OTHER): Payer: Medicare Other

## 2015-10-12 ENCOUNTER — Encounter: Payer: Self-pay | Admitting: Hematology and Oncology

## 2015-10-12 ENCOUNTER — Telehealth: Payer: Self-pay | Admitting: Hematology and Oncology

## 2015-10-12 ENCOUNTER — Ambulatory Visit (HOSPITAL_BASED_OUTPATIENT_CLINIC_OR_DEPARTMENT_OTHER): Payer: Medicare Other | Admitting: Hematology and Oncology

## 2015-10-12 ENCOUNTER — Ambulatory Visit: Payer: Medicare Other

## 2015-10-12 ENCOUNTER — Other Ambulatory Visit: Payer: Medicare Other

## 2015-10-12 VITALS — BP 116/71 | HR 108 | Temp 98.1°F | Resp 18 | Ht 62.0 in | Wt 202.7 lb

## 2015-10-12 DIAGNOSIS — C778 Secondary and unspecified malignant neoplasm of lymph nodes of multiple regions: Secondary | ICD-10-CM | POA: Diagnosis not present

## 2015-10-12 DIAGNOSIS — C50911 Malignant neoplasm of unspecified site of right female breast: Secondary | ICD-10-CM

## 2015-10-12 DIAGNOSIS — C7951 Secondary malignant neoplasm of bone: Principal | ICD-10-CM

## 2015-10-12 DIAGNOSIS — C50912 Malignant neoplasm of unspecified site of left female breast: Secondary | ICD-10-CM

## 2015-10-12 DIAGNOSIS — C50919 Malignant neoplasm of unspecified site of unspecified female breast: Secondary | ICD-10-CM

## 2015-10-12 DIAGNOSIS — D6959 Other secondary thrombocytopenia: Secondary | ICD-10-CM | POA: Diagnosis not present

## 2015-10-12 LAB — CBC WITH DIFFERENTIAL/PLATELET
BASO%: 0.5 % (ref 0.0–2.0)
BASOS ABS: 0 10*3/uL (ref 0.0–0.1)
EOS ABS: 0 10*3/uL (ref 0.0–0.5)
EOS%: 0.2 % (ref 0.0–7.0)
HEMATOCRIT: 38.4 % (ref 34.8–46.6)
HEMOGLOBIN: 12.8 g/dL (ref 11.6–15.9)
LYMPH%: 14.7 % (ref 14.0–49.7)
MCH: 31.8 pg (ref 25.1–34.0)
MCHC: 33.4 g/dL (ref 31.5–36.0)
MCV: 95.1 fL (ref 79.5–101.0)
MONO#: 0.3 10*3/uL (ref 0.1–0.9)
MONO%: 3.4 % (ref 0.0–14.0)
NEUT#: 7.6 10*3/uL — ABNORMAL HIGH (ref 1.5–6.5)
NEUT%: 81.2 % — AB (ref 38.4–76.8)
Platelets: 123 10*3/uL — ABNORMAL LOW (ref 145–400)
RBC: 4.04 10*6/uL (ref 3.70–5.45)
RDW: 16.5 % — AB (ref 11.2–14.5)
WBC: 9.3 10*3/uL (ref 3.9–10.3)
lymph#: 1.4 10*3/uL (ref 0.9–3.3)

## 2015-10-12 LAB — COMPREHENSIVE METABOLIC PANEL (CC13)
ALBUMIN: 4.2 g/dL (ref 3.5–5.0)
ALK PHOS: 78 U/L (ref 40–150)
ALT: 15 U/L (ref 0–55)
AST: 12 U/L (ref 5–34)
Anion Gap: 10 mEq/L (ref 3–11)
BILIRUBIN TOTAL: 0.69 mg/dL (ref 0.20–1.20)
BUN: 11.7 mg/dL (ref 7.0–26.0)
CALCIUM: 9.4 mg/dL (ref 8.4–10.4)
CO2: 21 mEq/L — ABNORMAL LOW (ref 22–29)
Chloride: 109 mEq/L (ref 98–109)
Creatinine: 0.8 mg/dL (ref 0.6–1.1)
EGFR: 81 mL/min/{1.73_m2} — AB (ref >90)
GLUCOSE: 130 mg/dL (ref 70–140)
POTASSIUM: 3.9 meq/L (ref 3.5–5.1)
SODIUM: 140 meq/L (ref 136–145)
Total Protein: 7 g/dL (ref 6.4–8.3)

## 2015-10-12 MED ORDER — PALONOSETRON HCL INJECTION 0.25 MG/5ML
0.2500 mg | Freq: Once | INTRAVENOUS | Status: AC
  Administered 2015-10-12: 0.25 mg via INTRAVENOUS

## 2015-10-12 MED ORDER — SODIUM CHLORIDE 0.9 % IJ SOLN
10.0000 mL | INTRAMUSCULAR | Status: DC | PRN
  Administered 2015-10-12: 10 mL
  Filled 2015-10-12: qty 10

## 2015-10-12 MED ORDER — ZOLEDRONIC ACID 4 MG/100ML IV SOLN
4.0000 mg | Freq: Once | INTRAVENOUS | Status: AC
  Administered 2015-10-12: 4 mg via INTRAVENOUS
  Filled 2015-10-12: qty 100

## 2015-10-12 MED ORDER — ZOLPIDEM TARTRATE 10 MG PO TABS: 10.0000 mg | ORAL_TABLET | Freq: Every evening | ORAL | Status: DC | PRN

## 2015-10-12 MED ORDER — SODIUM CHLORIDE 0.9 % IV SOLN
Freq: Once | INTRAVENOUS | Status: AC
  Administered 2015-10-12: 11:00:00 via INTRAVENOUS

## 2015-10-12 MED ORDER — HEPARIN SOD (PORK) LOCK FLUSH 100 UNIT/ML IV SOLN
500.0000 [IU] | Freq: Once | INTRAVENOUS | Status: AC | PRN
  Administered 2015-10-12: 500 [IU]
  Filled 2015-10-12: qty 5

## 2015-10-12 MED ORDER — SODIUM CHLORIDE 0.9 % IV SOLN
1.1700 mg/m2 | Freq: Once | INTRAVENOUS | Status: AC
  Administered 2015-10-12: 2.5 mg via INTRAVENOUS
  Filled 2015-10-12: qty 5

## 2015-10-12 MED ORDER — SODIUM CHLORIDE 0.9 % IV SOLN
Freq: Once | INTRAVENOUS | Status: AC
  Administered 2015-10-12: 12:00:00 via INTRAVENOUS
  Filled 2015-10-12: qty 5

## 2015-10-12 NOTE — Progress Notes (Signed)
Patient Care Team: Mackie Pai, PA-C as PCP - General (Physician Assistant) Thea Silversmith, MD as Consulting Physician (Radiation Oncology) Consuela Mimes, MD as Consulting Physician (Hematology and Oncology) Nicholas Lose, MD as Consulting Physician (Hematology and Oncology)  DIAGNOSIS: Breast cancer metastasized to bone [facility])   Staging form: Breast, AJCC 7th Edition     Pathologic: Stage IIIC (T4d, N3, cM0) - Signed by [name], MD on 01/19/2014   SUMMARY OF ONCOLOGIC HISTORY:   Breast cancer metastasized to bone ([place])   12/07/2010 - 04/12/2011 Neo-Adjuvant Chemotherapy Neoadjuvant FEC x6 followed by Taxotere x4   12/26/2010 Procedure Genetic testing showed mutation for BRCA2 2041insA   05/12/2011 Surgery Bilateral mastectomy with left axillary dissection 11/21 positive lymph nodes 5.8 cm tumor ER 95% PR 0% Ki-67 61% HER-2 negative ratio 1.39 T3, N3, 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   07/03/2011 - 08/17/2011 Radiation Therapy Radiation therapy to the chest and axilla   09/11/2011 -  Anti-estrogen oral therapy Tamoxifen later switched to Arimidex 07/2012   04/22/2012 Surgery Bilateral salpingo-oophorectomy   06/10/2015 Imaging Bone scan: uptake right lateral frontal bone, left malar region, manubrium, right 6,8 ribs; CT-CAP: New extensive infiltrate left & right hilar/mediastinal, right axillary, bil lower neck LN, 3.7 cm LUL consolidation, RML nodules,Lt Pl eff, RPLN   06/18/2015 Initial Biopsy Right axillary lymph node biopsy: 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, ER 90%, PR 0%, Ki-67 60%, HER-2 negative   06/29/2015 -  Chemotherapy Halaven days 1 and 8 Q 3 weeks    CHIEF COMPLIANT: cycle 6 day 1 halaven  INTERVAL HISTORY: Tina Vaughan is a 54 year old with above-mentioned history of metastatic breast cancer on palliative chemotherapy with Halaven. She complains of difficulty with sleeping and reports that Ativan is not working. Her shortness of breath is  stable denies any chest pain or cough expectoration. Denies any nausea or vomiting.  REVIEW OF SYSTEMS:   Constitutional: Denies fevers, chills or abnormal weight loss Eyes: Denies blurriness of vision Ears, nose, mouth, throat, and face: Denies mucositis or sore throat Respiratory: shortness of breath Cardiovascular: Denies palpitation, chest discomfort or lower extremity swelling Gastrointestinal:  Denies nausea, heartburn or change in bowel habits Skin: Denies abnormal skin rashes Lymphatics: Denies new lymphadenopathy or easy bruising Neurological:Denies numbness, tingling or new weaknesses Behavioral/Psych: Mood is stable, no new changes   All other systems were reviewed with the patient and are negative.  I have reviewed the past medical history, past surgical history, social history and family history with the patient and they are unchanged from previous note.  ALLERGIES:  is allergic to doxycycline hydrochloride.  MEDICATIONS:  Current Outpatient Prescriptions  Medication Sig Dispense Refill  . Calcium 200 MG TABS Take 200 mg by mouth daily.     . cholecalciferol (VITAMIN D) 1000 UNITS tablet Take 1,000 Units by mouth daily.     . citalopram (CELEXA) 40 MG tablet TAKE 1 TABLET BY MOUTH DAILY. 30 tablet 5  . dexamethasone (DECADRON) 4 MG tablet Take 0.5 tablets (2 mg total) by mouth daily. 30 tablet 1  . fluocinonide cream (LIDEX) 2.40 % Apply 1 application topically 2 (two) times daily as needed (for rash.).    Marland Kitchen gabapentin (NEURONTIN) 300 MG capsule Take 1 capsule (300 mg total) by mouth daily. 30 capsule 5  . ibuprofen (ADVIL,MOTRIN) 200 MG tablet Take 600 mg by mouth every 8 (eight) hours as needed for moderate pain (pain).     Marland Kitchen lidocaine-prilocaine (EMLA)  cream Apply to affected area once (Patient taking differently: Apply 1 application topically daily as needed. For port) 30 g 3  . naproxen sodium (ANAPROX) 220 MG tablet Take 220-440 mg by mouth 2 (two) times daily as  needed (for muscle aches.).     Marland Kitchen omeprazole (PRILOSEC) 20 MG capsule Take 1 capsule (20 mg total) by mouth daily. Take 2 x  30-60 min before first meal of the day 30 capsule 3  . ondansetron (ZOFRAN) 8 MG tablet TAKE 1 TABLET BY MOUTH 2 TIMES DAILY. START THE DAY AFTER CHEMO FOR 2 DAYS. THEN TAKE AS NEEDED FOR NAUSEA OR VOMITING. 30 tablet 1  . ranitidine (ZANTAC) 150 MG tablet 2 at bedtime    . UNABLE TO FIND Dispense compression sleeve 20-30 mm for this patient with history of breast cancer s/p surgery 1 each 0  . zolpidem (AMBIEN) 10 MG tablet Take 1 tablet (10 mg total) by mouth at bedtime as needed for sleep. 30 tablet 0   No current facility-administered medications for this visit.    PHYSICAL EXAMINATION: ECOG PERFORMANCE STATUS: 1 - Symptomatic but completely ambulatory  Filed Vitals:   10/12/15 1000  BP: 116/71  Pulse: 108  Temp: 98.1 F (36.7 C)  Resp: 18   Filed Weights   10/12/15 1000  Weight: 202 lb 11.2 oz (91.944 kg)    GENERAL:alert, no distress and comfortable SKIN: skin color, texture, turgor are normal, no rashes or significant lesions EYES: normal, Conjunctiva are pink and non-injected, sclera clear OROPHARYNX:no exudate, no erythema and lips, buccal mucosa, and tongue normal  NECK: supple, thyroid normal size, non-tender, without nodularity LYMPH:  no palpable lymphadenopathy in the cervical, axillary or inguinal LUNGS: diminished breath sounds in the bases HEART: regular rate & rhythm and no murmurs and no lower extremity edema ABDOMEN:abdomen soft, non-tender and normal bowel sounds Musculoskeletal:no cyanosis of digits and no clubbing  NEURO: alert & oriented x 3 with fluent speech, no focal motor/sensory deficits  LABORATORY DATA:  I have reviewed the data as listed   Chemistry      Component Value Date/Time   NA 140 10/12/2015 0949   NA 141 07/31/2015 1929   K 3.9 10/12/2015 0949   K 3.1* 07/31/2015 1929   CL 106 07/31/2015 1929   CL 108*  07/30/2012 1430   CO2 21* 10/12/2015 0949   CO2 24 07/31/2015 1929   BUN 11.7 10/12/2015 0949   BUN 8 07/31/2015 1929   CREATININE 0.8 10/12/2015 0949   CREATININE 0.73 07/31/2015 1929      Component Value Date/Time   CALCIUM 9.4 10/12/2015 0949   CALCIUM 9.4 07/31/2015 1929   ALKPHOS 78 10/12/2015 0949   ALKPHOS 90 07/31/2015 1929   AST 12 10/12/2015 0949   AST 31 07/31/2015 1929   ALT 15 10/12/2015 0949   ALT 36 07/31/2015 1929   BILITOT 0.69 10/12/2015 0949   BILITOT 0.8 07/31/2015 1929       Lab Results  Component Value Date   WBC 9.3 10/12/2015   HGB 12.8 10/12/2015   HCT 38.4 10/12/2015   MCV 95.1 10/12/2015   PLT 123* 10/12/2015   NEUTROABS 7.6* 10/12/2015   ASSESSMENT & PLAN:  Breast cancer metastasized to bone [facility]) Right axillary lymph node biopsy 06/18/2015: 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, ER 90%, PR 0%, Ki-67 60%, HER-2 negative Bone scan 06/10/2015: uptake right lateral frontal bone, left malar region, manubrium, right 6,8 ribs;  CT-CAP: New extensive infiltrate left &  right hilar/mediastinal, right axillary, bil lower neck LN, 3.7 cm LUL consolidation, RML nodules,Lt Pl eff, RPLN (Left breast inflammatory breast cancer T4d, N3, M0 stage IIIc grade 2 left 21 positive lymph nodes, 5.8 cm tumor ER 95% PR 0% Ki-67 61% HER-2 negative. BRCA 2 mutation underwent salpingo-oophorectomy Status post bilateral mastectomies and radiation to left chest wall and axilla currently on tamoxifen since October 2012) ------------------------------------------------------------------------------------------------------------------------------------------------------- Treatment plan: Halaven day 1 and day 8 every 3 weeks 6 cycles following which we will consider starting letrozole with Ibrance Current treatment:cycle 5 day 8 Halaven Halaven toxicities: 1. Nausea and dry heaves: Aloxi, Emend and oral dexamethasone helped resolve her nausea. 2. Cough without  expectoration 3. Elevation of AST and ALT: We will continue to watch and monitor this. 4. We consulted palliative care. 5. 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 6. Profound decreased appetite: encourage the patient to continue to eat good nutritious diet. 7. Severe fatigue: Patient is having increased fatigue and emotional problems,we reduced the dosage of her chemotherapy with cycle 5-1.2 mg/m.  Profound shortness of breath: on Home O2 at bedtime. Patient has seen Dr. Melvyn Novas with pulmonology. He is treating her for acid reflux. Sniff test is normal. I renewed her omeprazole today.  Radiology review (after 3 cycles Halaven): Decrease in thoracic lymphadenopathy, Decrease in Left upper lobe mass 2.5 cm x 3.7 cm to 1.5 cm x 3.4 cm; decrease in upper abdominal LN, sclerotic lesion L4   Bone metastases: Zometa once a month  Scalp folliculitis: resolved with clindamycin Return to clinic in 3 weeks for review of PET/CT scan. At that time we'll have to make a decision between continuation of Halaven versus antiestrogen therapy with Ibrance with letrozole. PET CT has been ordered for December 19 , which will be at the conclusion of chemotherapy  No orders of the defined types were placed in this encounter.   The patient has a good understanding of the overall plan. she agrees with it. she will call with any problems that may develop before the next visit here.   Rulon Eisenmenger, MD 10/12/2015

## 2015-10-12 NOTE — Addendum Note (Signed)
Addended by: Prentiss Bells on: 10/12/2015 03:04 PM   Modules accepted: Medications

## 2015-10-12 NOTE — Assessment & Plan Note (Signed)
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 BRCA 2 mutation underwent salpingo-oophorectomy Status post bilateral mastectomies and radiation to left chest wall and axilla currently on tamoxifen since October 2012) ------------------------------------------------------------------------------------------------------------------------------------------------------- Treatment plan: Halaven day 1 and day 8 every 3 weeks 6 cycles following which we will consider starting letrozole with Ibrance Current treatment:cycle 5 day 8 Halaven Halaven toxicities: 1. Nausea and dry heaves: Aloxi, Emend and oral dexamethasone helped resolve her nausea. 2. Cough without expectoration 3. Elevation of AST and ALT: We will continue to watch and monitor this. 4. We consulted palliative care. 5. 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 6. Profound decreased appetite: encourage the patient to continue to eat good nutritious diet. 7. Severe fatigue: Patient is having increased fatigue and emotional problems,we reduced the dosage of her chemotherapy with cycle 5-1.2 mg/m.  Profound shortness of breath: on Home O2 at bedtime. Patient has seen Dr. Melvyn Novas with pulmonology. He is treating her for acid reflux. Sniff test is normal. I renewed her omeprazole today.  Radiology review (after 3 cycles Halaven): Decrease in thoracic lymphadenopathy, Decrease in Left upper lobe mass 2.5  cm x 3.7 cm to 1.5 cm x 3.4 cm; decrease in upper abdominal LN, sclerotic lesion L4   Bone metastases: Zometa once a month  Scalp folliculitis: resolved with clindamycin Return to clinic in 3 weeks for review of PET/CT scan. At that time we'll have to make a decision between continuation of Halaven versus antiestrogen therapy with Ibrance with letrozole. PET CT has been ordered for December 19 , which will be at the conclusion of chemotherapy

## 2015-10-12 NOTE — Telephone Encounter (Signed)
Left message for patient re next appointment for 12/20 and mailed schedule. Last date in care plan 12/6. Left message for desk nurse asking for confirmation on if patient needs tx 12/6 or any other tx appointments after today as next f/u is not until 12/20.

## 2015-10-15 ENCOUNTER — Telehealth: Payer: Self-pay | Admitting: Hematology and Oncology

## 2015-10-15 NOTE — Telephone Encounter (Signed)
returned call to pt and sched nxt chemo

## 2015-10-15 NOTE — Telephone Encounter (Signed)
Paulla Fore desk nurse and confirmed 12.6 appts...ok and aware

## 2015-10-19 ENCOUNTER — Ambulatory Visit (HOSPITAL_BASED_OUTPATIENT_CLINIC_OR_DEPARTMENT_OTHER): Payer: Medicare Other

## 2015-10-19 ENCOUNTER — Other Ambulatory Visit (HOSPITAL_BASED_OUTPATIENT_CLINIC_OR_DEPARTMENT_OTHER): Payer: Medicare Other

## 2015-10-19 VITALS — BP 124/80 | HR 90 | Temp 98.1°F

## 2015-10-19 DIAGNOSIS — C7951 Secondary malignant neoplasm of bone: Secondary | ICD-10-CM | POA: Diagnosis not present

## 2015-10-19 DIAGNOSIS — C50911 Malignant neoplasm of unspecified site of right female breast: Secondary | ICD-10-CM

## 2015-10-19 DIAGNOSIS — C778 Secondary and unspecified malignant neoplasm of lymph nodes of multiple regions: Secondary | ICD-10-CM | POA: Diagnosis not present

## 2015-10-19 DIAGNOSIS — C50919 Malignant neoplasm of unspecified site of unspecified female breast: Secondary | ICD-10-CM

## 2015-10-19 DIAGNOSIS — C50912 Malignant neoplasm of unspecified site of left female breast: Secondary | ICD-10-CM

## 2015-10-19 DIAGNOSIS — Z5189 Encounter for other specified aftercare: Secondary | ICD-10-CM | POA: Diagnosis not present

## 2015-10-19 LAB — CBC WITH DIFFERENTIAL/PLATELET
BASO%: 1.2 % (ref 0.0–2.0)
BASOS ABS: 0.1 10*3/uL (ref 0.0–0.1)
EOS ABS: 0 10*3/uL (ref 0.0–0.5)
EOS%: 0.2 % (ref 0.0–7.0)
HEMATOCRIT: 32.8 % — AB (ref 34.8–46.6)
HEMOGLOBIN: 11.3 g/dL — AB (ref 11.6–15.9)
LYMPH#: 1.2 10*3/uL (ref 0.9–3.3)
LYMPH%: 24 % (ref 14.0–49.7)
MCH: 32.5 pg (ref 25.1–34.0)
MCHC: 34.4 g/dL (ref 31.5–36.0)
MCV: 94.4 fL (ref 79.5–101.0)
MONO#: 0.3 10*3/uL (ref 0.1–0.9)
MONO%: 5.4 % (ref 0.0–14.0)
NEUT#: 3.3 10*3/uL (ref 1.5–6.5)
NEUT%: 69.2 % (ref 38.4–76.8)
PLATELETS: 210 10*3/uL (ref 145–400)
RBC: 3.47 10*6/uL — ABNORMAL LOW (ref 3.70–5.45)
RDW: 14.8 % — AB (ref 11.2–14.5)
WBC: 4.8 10*3/uL (ref 3.9–10.3)

## 2015-10-19 LAB — COMPREHENSIVE METABOLIC PANEL
ALBUMIN: 3.9 g/dL (ref 3.5–5.0)
ALK PHOS: 56 U/L (ref 40–150)
ALT: 16 U/L (ref 0–55)
ANION GAP: 10 meq/L (ref 3–11)
AST: 13 U/L (ref 5–34)
BUN: 8.7 mg/dL (ref 7.0–26.0)
CALCIUM: 9.5 mg/dL (ref 8.4–10.4)
CO2: 23 mEq/L (ref 22–29)
Chloride: 108 mEq/L (ref 98–109)
Creatinine: 0.7 mg/dL (ref 0.6–1.1)
Glucose: 112 mg/dl (ref 70–140)
POTASSIUM: 3.6 meq/L (ref 3.5–5.1)
Sodium: 141 mEq/L (ref 136–145)
Total Bilirubin: 0.55 mg/dL (ref 0.20–1.20)
Total Protein: 6.5 g/dL (ref 6.4–8.3)

## 2015-10-19 MED ORDER — PEGFILGRASTIM 6 MG/0.6ML ~~LOC~~ PSKT
6.0000 mg | PREFILLED_SYRINGE | Freq: Once | SUBCUTANEOUS | Status: AC
  Administered 2015-10-19: 6 mg via SUBCUTANEOUS
  Filled 2015-10-19: qty 0.6

## 2015-10-19 MED ORDER — PALONOSETRON HCL INJECTION 0.25 MG/5ML
INTRAVENOUS | Status: AC
  Filled 2015-10-19: qty 5

## 2015-10-19 MED ORDER — SODIUM CHLORIDE 0.9 % IV SOLN
1.1900 mg/m2 | Freq: Once | INTRAVENOUS | Status: AC
  Administered 2015-10-19: 2.5 mg via INTRAVENOUS
  Filled 2015-10-19: qty 5

## 2015-10-19 MED ORDER — SODIUM CHLORIDE 0.9 % IV SOLN
Freq: Once | INTRAVENOUS | Status: AC
  Administered 2015-10-19: 10:00:00 via INTRAVENOUS
  Filled 2015-10-19: qty 5

## 2015-10-19 MED ORDER — SODIUM CHLORIDE 0.9 % IJ SOLN
10.0000 mL | INTRAMUSCULAR | Status: DC | PRN
  Administered 2015-10-19: 10 mL
  Filled 2015-10-19: qty 10

## 2015-10-19 MED ORDER — PALONOSETRON HCL INJECTION 0.25 MG/5ML
0.2500 mg | Freq: Once | INTRAVENOUS | Status: AC
  Administered 2015-10-19: 0.25 mg via INTRAVENOUS

## 2015-10-19 MED ORDER — SODIUM CHLORIDE 0.9 % IV SOLN
Freq: Once | INTRAVENOUS | Status: AC
  Administered 2015-10-19: 10:00:00 via INTRAVENOUS

## 2015-10-19 MED ORDER — HEPARIN SOD (PORK) LOCK FLUSH 100 UNIT/ML IV SOLN
500.0000 [IU] | Freq: Once | INTRAVENOUS | Status: AC | PRN
  Administered 2015-10-19: 500 [IU]
  Filled 2015-10-19: qty 5

## 2015-10-19 NOTE — Patient Instructions (Signed)
[facility] Discharge Instructions for Patients Receiving Chemotherapy  Today you received the following chemotherapy agents Halaven.  To help prevent nausea and vomiting after your treatment, we encourage you to take your nausea medication as prescribed.   If you develop nausea and vomiting that is not controlled by your nausea medication, call the clinic.   BELOW ARE SYMPTOMS THAT SHOULD BE REPORTED IMMEDIATELY:  *FEVER GREATER THAN 100.5 F  *CHILLS WITH OR WITHOUT FEVER  NAUSEA AND VOMITING THAT IS NOT CONTROLLED WITH YOUR NAUSEA MEDICATION  *UNUSUAL SHORTNESS OF BREATH  *UNUSUAL BRUISING OR BLEEDING  TENDERNESS IN MOUTH AND THROAT WITH OR WITHOUT PRESENCE OF ULCERS  *URINARY PROBLEMS  *BOWEL PROBLEMS  UNUSUAL RASH Items with * indicate a potential emergency and should be followed up as soon as possible.  Feel free to call the clinic you have any questions or concerns. The clinic phone number is [phone].  Please show the CHEMO ALERT CARD at check-in to the Emergency Department and triage nurse.   

## 2015-10-29 ENCOUNTER — Telehealth: Payer: Self-pay | Admitting: Internal Medicine

## 2015-10-29 NOTE — Telephone Encounter (Signed)
Pt last seen 08/27/2015 and told to f/u in 2 weeks. Was also advised no need for daytime O2 but use at bedtime with CPAP. Spoke with Mandy from [facility]. Pt advised them she did not have an O2 problem and only uses O2 at bedtime. MW please advise if okay to d/c O2

## 2015-10-29 NOTE — Telephone Encounter (Signed)
Per Leafy Ro, we need to call pt to explain this to her. lmomtcb x1

## 2015-10-29 NOTE — Telephone Encounter (Signed)
Ok to d/c all but the noct 02

## 2015-11-01 ENCOUNTER — Ambulatory Visit (HOSPITAL_COMMUNITY)
Admission: RE | Admit: 2015-11-01 | Discharge: 2015-11-01 | Disposition: A | Discharge status: Home / Self Care | Payer: Medicare Other | Source: Ambulatory Visit | Attending: Hematology and Oncology | Admitting: Hematology and Oncology

## 2015-11-01 DIAGNOSIS — I700 Atherosclerosis of aorta: Secondary | ICD-10-CM | POA: Diagnosis not present

## 2015-11-01 DIAGNOSIS — C50911 Malignant neoplasm of unspecified site of right female breast: Secondary | ICD-10-CM | POA: Diagnosis not present

## 2015-11-01 DIAGNOSIS — I313 Pericardial effusion (noninflammatory): Secondary | ICD-10-CM | POA: Insufficient documentation

## 2015-11-01 DIAGNOSIS — R918 Other nonspecific abnormal finding of lung field: Secondary | ICD-10-CM | POA: Diagnosis not present

## 2015-11-01 DIAGNOSIS — C7989 Secondary malignant neoplasm of other specified sites: Secondary | ICD-10-CM | POA: Diagnosis not present

## 2015-11-01 DIAGNOSIS — Z79899 Other long term (current) drug therapy: Secondary | ICD-10-CM | POA: Insufficient documentation

## 2015-11-01 DIAGNOSIS — C7951 Secondary malignant neoplasm of bone: Secondary | ICD-10-CM | POA: Diagnosis not present

## 2015-11-01 DIAGNOSIS — C50919 Malignant neoplasm of unspecified site of unspecified female breast: Secondary | ICD-10-CM | POA: Diagnosis not present

## 2015-11-01 DIAGNOSIS — R590 Localized enlarged lymph nodes: Secondary | ICD-10-CM | POA: Diagnosis not present

## 2015-11-01 LAB — GLUCOSE, CAPILLARY: GLUCOSE-CAPILLARY: 100 mg/dL — AB (ref 65–99)

## 2015-11-01 MED ORDER — FLUDEOXYGLUCOSE F - 18 (FDG) INJECTION
9.9900 | Freq: Once | INTRAVENOUS | Status: AC | PRN
  Administered 2015-11-01: 9.99 via INTRAVENOUS

## 2015-11-01 NOTE — Telephone Encounter (Signed)
LMTCB x2 for pt 

## 2015-11-02 ENCOUNTER — Encounter: Payer: Self-pay | Admitting: Hematology and Oncology

## 2015-11-02 ENCOUNTER — Telehealth: Payer: Self-pay | Admitting: Hematology and Oncology

## 2015-11-02 ENCOUNTER — Ambulatory Visit (HOSPITAL_BASED_OUTPATIENT_CLINIC_OR_DEPARTMENT_OTHER): Payer: Medicare Other | Admitting: Hematology and Oncology

## 2015-11-02 VITALS — BP 107/82 | HR 94 | Temp 97.6°F | Resp 18 | Ht 62.0 in | Wt 203.5 lb

## 2015-11-02 DIAGNOSIS — C50512 Malignant neoplasm of lower-outer quadrant of left female breast: Secondary | ICD-10-CM | POA: Diagnosis not present

## 2015-11-02 DIAGNOSIS — C7951 Secondary malignant neoplasm of bone: Secondary | ICD-10-CM | POA: Insufficient documentation

## 2015-11-02 MED ORDER — LETROZOLE 2.5 MG PO TABS: 2.5000 mg | ORAL_TABLET | Freq: Every day | ORAL | Status: DC

## 2015-11-02 MED ORDER — PALBOCICLIB 125 MG PO CAPS: 125.0000 mg | ORAL_CAPSULE | Freq: Every day | ORAL | Status: DC

## 2015-11-02 NOTE — Progress Notes (Signed)
Patient Care Team: Mackie Pai, PA-C as PCP - General (Physician Assistant) Thea Silversmith, MD as Consulting Physician (Radiation Oncology) Consuela Mimes, MD as Consulting Physician (Hematology and Oncology) Nicholas Lose, MD as Consulting Physician (Hematology and Oncology)  DIAGNOSIS: Breast cancer of lower-outer quadrant of left female breast [facility])   Staging form: Breast, AJCC 7th Edition     Pathologic: Stage IIIC (T4d, N3, cM0) - Signed by [name], MD on 01/19/2014   SUMMARY OF ONCOLOGIC HISTORY:   Breast cancer of lower-outer quadrant of left female breast ([place])   12/07/2010 - 04/12/2011 Neo-Adjuvant Chemotherapy Neoadjuvant FEC x6 followed by Taxotere x4   12/26/2010 Procedure Genetic testing showed mutation for BRCA2 [id number]   05/12/2011 Surgery Bilateral mastectomy with left axillary dissection 11/21 positive lymph nodes 5.8 cm tumor ER 95% PR 0% Ki-67 61% HER-2 negative ratio 1.39 T3, N3, 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   07/03/2011 - 08/17/2011 Radiation Therapy Radiation therapy to the chest and axilla   09/11/2011 -  Anti-estrogen oral therapy Tamoxifen later switched to Arimidex 07/2012   04/22/2012 Surgery Bilateral salpingo-oophorectomy   06/10/2015 Imaging Bone scan: uptake right lateral frontal bone, left malar region, manubrium, right 6,8 ribs; CT-CAP: New extensive infiltrate left & right hilar/mediastinal, right axillary, bil lower neck LN, 3.7 cm LUL consolidation, RML nodules,Lt Pl eff, RPLN   06/18/2015 Initial Biopsy Right axillary lymph node biopsy: 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 She completed 6 cycles of chemotherapy and underwent a PET CT scan. Her breathing is stable. Her bone issues are also relatively stable she does have intermittent aches and pains in her bones.  REVIEW OF SYSTEMS:   Constitutional: Denies fevers, chills or abnormal weight loss Eyes: Denies blurriness of vision Ears, nose, mouth, throat, and face: Denies mucositis or sore throat Respiratory: shortness of breath Cardiovascular: Denies palpitation, chest discomfort or lower extremity swelling Gastrointestinal:  Denies nausea, heartburn or change in bowel habits Skin: Denies abnormal skin rashes Lymphatics: Denies new lymphadenopathy or easy bruising Neurological:Denies numbness, tingling or new weaknesses Behavioral/Psych: Mood is stable, no new changes  Breast:  denies any pain or lumps or nodules in either breasts All other systems were reviewed with the patient and are negative.  I have reviewed the past medical history, past surgical history, social history and family history with the patient and they are unchanged from previous note.  ALLERGIES:  is allergic to doxycycline hydrochloride.  MEDICATIONS:  Current Outpatient Prescriptions  Medication Sig Dispense Refill  . buPROPion (WELLBUTRIN XL) 150 MG 24 hr tablet   0  . Calcium 200 MG TABS Take 200 mg by mouth daily.     . cholecalciferol (VITAMIN D) 1000 UNITS tablet Take 1,000 Units by mouth daily.     . citalopram (CELEXA) 40 MG tablet TAKE 1 TABLET BY MOUTH DAILY. 30 tablet 5  . dexamethasone (DECADRON) 4 MG tablet Take 0.5 tablets (2 mg total) by  mouth daily. 30 tablet 1  . fluocinonide cream (LIDEX) 2.54 % Apply 1 application topically 2 (two) times daily as needed (for rash.).    Marland Kitchen gabapentin (NEURONTIN) 300 MG capsule Take 1  capsule (300 mg total) by mouth daily. 30 capsule 5  . ibuprofen (ADVIL,MOTRIN) 200 MG tablet Take 600 mg by mouth every 8 (eight) hours as needed for moderate pain (pain).     Marland Kitchen lidocaine-prilocaine (EMLA) cream Apply to affected area once (Patient taking differently: Apply 1 application topically daily as needed. For port) 30 g 3  . LORazepam (ATIVAN) 1 MG tablet   0  . naproxen sodium (ANAPROX) 220 MG tablet Take 220-440 mg by mouth 2 (two) times daily as needed (for muscle aches.).     Marland Kitchen omeprazole (PRILOSEC) 20 MG capsule Take 1 capsule (20 mg total) by mouth daily. Take 2 x  30-60 min before first meal of the day 30 capsule 3  . ondansetron (ZOFRAN) 8 MG tablet TAKE 1 TABLET BY MOUTH 2 TIMES DAILY. START THE DAY AFTER CHEMO FOR 2 DAYS. THEN TAKE AS NEEDED FOR NAUSEA OR VOMITING. 30 tablet 1  . ranitidine (ZANTAC) 150 MG tablet 2 at bedtime    . UNABLE TO FIND Dispense compression sleeve 20-30 mm for this patient with history of breast cancer s/p surgery 1 each 0  . zolpidem (AMBIEN) 10 MG tablet Take 1 tablet (10 mg total) by mouth at bedtime as needed for sleep. 30 tablet 0   No current facility-administered medications for this visit.    PHYSICAL EXAMINATION: ECOG PERFORMANCE STATUS: 1 - Symptomatic but completely ambulatory  Filed Vitals:   11/02/15 1417  BP: 107/82  Pulse: 94  Temp: 97.6 F (36.4 C)  Resp: 18   Filed Weights   11/02/15 1417  Weight: 203 lb 8 oz (92.307 kg)    GENERAL:alert, no distress and comfortable SKIN: skin color, texture, turgor are normal, no rashes or significant lesions EYES: normal, Conjunctiva are pink and non-injected, sclera clear OROPHARYNX:no exudate, no erythema and lips, buccal mucosa, and tongue normal  NECK: supple, thyroid normal size, non-tender, without nodularity LYMPH:  no palpable lymphadenopathy in the cervical, axillary or inguinal LUNGS: clear to auscultation and percussion with normal breathing effort HEART: regular rate &  rhythm and no murmurs and no lower extremity edema ABDOMEN:abdomen soft, non-tender and normal bowel sounds Musculoskeletal:no cyanosis of digits and no clubbing  NEURO: alert & oriented x 3 with fluent speech, no focal motor/sensory deficits   LABORATORY DATA:  I have reviewed the data as listed   Chemistry      Component Value Date/Time   NA 141 10/19/2015 0922   NA 141 07/31/2015 1929   K 3.6 10/19/2015 0922   K 3.1* 07/31/2015 1929   CL 106 07/31/2015 1929   CL 108* 07/30/2012 1430   CO2 23 10/19/2015 0922   CO2 24 07/31/2015 1929   BUN 8.7 10/19/2015 0922   BUN 8 07/31/2015 1929   CREATININE 0.7 10/19/2015 0922   CREATININE 0.73 07/31/2015 1929      Component Value Date/Time   CALCIUM 9.5 10/19/2015 0922   CALCIUM 9.4 07/31/2015 1929   ALKPHOS 56 10/19/2015 0922   ALKPHOS 90 07/31/2015 1929   AST 13 10/19/2015 0922   AST 31 07/31/2015 1929   ALT 16 10/19/2015 0922   ALT 36 07/31/2015 1929   BILITOT 0.55 10/19/2015 0922   BILITOT 0.8 07/31/2015 1929       Lab Results  Component Value Date   WBC 4.8 10/19/2015   HGB 11.3* 10/19/2015   HCT 32.8* 10/19/2015   MCV 94.4 10/19/2015   PLT 210 10/19/2015   NEUTROABS 3.3 10/19/2015     RADIOGRAPHIC STUDIES: I have personally reviewed the radiology reports and agreed with their findings. Nm Pet Image Restag (ps) Skull Base To Thigh  11/01/2015  CLINICAL DATA:  Subsequent treatment strategy for metastatic breast cancer. EXAM: NUCLEAR MEDICINE PET SKULL BASE TO THIGH TECHNIQUE: 10.0 mCi F-18 FDG was injected intravenously. Full-ring PET imaging was performed from the skull base to thigh after the radiotracer. CT data was obtained and used for attenuation correction and anatomic localization. FASTING BLOOD GLUCOSE:  Value: 100 mg/dl COMPARISON:  CT chest abdomen pelvis dated 08/09/2015 FINDINGS: NECK No hypermetabolic cervical lymphadenopathy. CHEST Multifocal patchy left upper lobe opacities, progressed. For example,  there is a 1.5 x 1.6 cm patchy opacity in the posterior left upper lobe, max SUV 4.3, new. This appearance may reflect infection or possibly tumor. Otherwise, the lungs are clear, without suspicious pulmonary nodules. No pleural effusion or pneumothorax. Heart is normal in size. Trace pericardial effusion. Mild atherosclerotic calcifications of the aortic arch. Right chest port terminates at the cavoatrial junction. Improving thoracic lymphadenopathy. 8 mm short axis prevascular node (series 4/ image 57), previously 10 mm. Persistent hypermetabolic left infrahilar nodal soft tissue, max SUV 8.3. 10 mm short axis right paratracheal node (series 4/ image 54), unchanged, max SUV 20.5. Mild subcutaneous nodularity, including an 8 mm subcutaneous nodule anterior to the right shoulder (series 4/ image 51) with max SUV 8.8. ABDOMEN/PELVIS No abnormal hypermetabolic activity within the liver, pancreas, adrenal glands, or spleen. No hypermetabolic lymph nodes in the abdomen or pelvis. 7 mm short axis peripancreatic node (series 4/image 101), previously 9 mm, non FDG avid. 8 mm subcutaneous nodule along the right posterior gluteal region (series 4/image 154), max SUV 4.8. SKELETON Multifocal/diffuse osseous metastases throughout the visualized axial and appendicular skeleton. Representative lesions include: --Left humeral head, max SUV 6.4 --Mid thoracic spine, max SUV 8.1 --Right anterior rib, max SUV 4.6 --Mid lumbar spine, max SUV 9.1 --Right proximal femur, max SUV 7.9 IMPRESSION: Focal patchy left upper lobe opacities, new/increased, possibly reflecting infection or tumor. Mild improving hypermetabolic thoracic lymphadenopathy, most prominent in the right paratracheal and left infrahilar regions. Subcutaneous metastases anterior to the right shoulder and in the right posterior gluteal region. Multifocal osseous metastases throughout the visualized axial and appendicular skeleton. Electronically Signed   By: Julian Hy M.D.   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 BRCA 2 mutation underwent salpingo-oophorectomy Status post bilateral mastectomies and radiation to left chest wall and axilla currently on tamoxifen since October 2012) ------------------------------------------------------------------------------------------------------------------------------------------------------- Treatment summary: Halaven day 1 and day 8 every 3 weeks 6 cycles started 06/29/2015 and completed 10/19/2015 Treatment plan: letrozole with Ibrance along with Zometa  PET/CT scan 11/01/2015:Focal patchy LUL  opacities new/increased possibly reflecting infection or tumor, mild improving hypermet LN in Rt paratracheal and left infrahilar region, subcut mets to right shoulder and right post gluteal, ext bone mets  Ibrance counseling: I discussed the risks and benefits of Ibrance including myelosuppression especially neutropenia and with that risk of infection, there is risk of pulmonary embolism and mild peripheral neuropathy as well. Fatigue, nausea, diarrhea, decreased appetite as well as alopecia and thrombocytopenia are also potential side effects of Ibrance  Patient does not have Medicare part D coverage. We are trying to figure out how to get her medication. She'll started letrozole today and we will figure out how  she can get Ibrance. Zometa to be given next week  Return to clinic 1 week after starting Ibrance for toxicity check.  No orders of the defined types were placed in this encounter.   The patient has a good understanding of the overall plan. she agrees with it. she will call with any problems that may develop before the next visit here.   Rulon Eisenmenger, MD 11/02/2015     3

## 2015-11-02 NOTE — Assessment & Plan Note (Signed)
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 BRCA 2 mutation underwent salpingo-oophorectomy Status post bilateral mastectomies and radiation to left chest wall and axilla currently on tamoxifen since October 2012) ------------------------------------------------------------------------------------------------------------------------------------------------------- Treatment summary: Halaven day 1 and day 8 every 3 weeks 6 cycles started 06/29/2015 and completed 10/19/2015 Treatment plan: letrozole with Ibrance along with X [place]  PET/CT scan 11/01/2015:Focal patchy LUL opacities new/increased possibly reflecting infection or tumor, mild improving hypermet LN in Rt paratracheal and left infrahilar region, subcut mets to right shoulder and right post gluteal, ext bone mets  Ibrance counseling: I discussed the risks and benefits of Ibrance including myelosuppression especially neutropenia and with that risk of infection, there is risk of pulmonary embolism and mild peripheral neuropathy as well. Fatigue, nausea, diarrhea, decreased appetite as well as alopecia and thrombocytopenia are also potential side effects of Ibrance  Return to clinic 1 week after starting Ibrance for toxicity check.

## 2015-11-02 NOTE — Telephone Encounter (Signed)
Appointments made and avs printed for patient °

## 2015-11-02 NOTE — Telephone Encounter (Signed)
Attempted to contact pt. Line was busy. Will try back. 

## 2015-11-03 ENCOUNTER — Other Ambulatory Visit: Payer: Self-pay | Admitting: *Deleted

## 2015-11-03 ENCOUNTER — Telehealth: Payer: Self-pay | Admitting: *Deleted

## 2015-11-03 DIAGNOSIS — C50512 Malignant neoplasm of lower-outer quadrant of left female breast: Secondary | ICD-10-CM

## 2015-11-03 MED ORDER — OXYCODONE-ACETAMINOPHEN 5-325 MG PO TABS: 1.0000 | ORAL_TABLET | Freq: Two times a day (BID) | ORAL | Status: DC | PRN

## 2015-11-03 NOTE — Telephone Encounter (Signed)
Call from patient "requesting pain medication.  I saw Dr. Lindi Adie, received bad news and forgot to ask for pain medication.  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  Aleve stopped working two to three weeks ago and pain progressively getting worse.  Pain rates as five to six on pain scale not sure if something can be called in.  I can come in if necessary.   I also need ambien refill when I come in 11-12-2015."  This nurse assessed bowel status.  "Last bm was three to four days ago if I remember correctly.  I take stool softeners.  I do not drink enough water and I have a bad appetite."  Return number [phone].  Sounds s.o.b or working hard to speak with this call but says "this is not new.  Dr Lindi Adie is aware of respiratory status."

## 2015-11-03 NOTE — Telephone Encounter (Signed)
Let patient know that prescription is ready for pick-up, advised that she can take Miralax for her constipation. She verbalized understanding.

## 2015-11-03 NOTE — Telephone Encounter (Signed)
Pt is aware that [employer] will be contacting her to pick up oxygen. Nothing further was needed.

## 2015-11-03 NOTE — Telephone Encounter (Signed)
LMTCB x 3 

## 2015-11-03 NOTE — Telephone Encounter (Signed)
Pt returning call and can be reached @ [phone].Tina Vaughan

## 2015-11-04 ENCOUNTER — Other Ambulatory Visit: Payer: Self-pay | Admitting: Hematology and Oncology

## 2015-11-05 ENCOUNTER — Other Ambulatory Visit: Payer: Self-pay | Admitting: *Deleted

## 2015-11-05 ENCOUNTER — Telehealth: Payer: Self-pay | Admitting: Hematology and Oncology

## 2015-11-05 DIAGNOSIS — C50512 Malignant neoplasm of lower-outer quadrant of left female breast: Secondary | ICD-10-CM

## 2015-11-05 DIAGNOSIS — C7951 Secondary malignant neoplasm of bone: Principal | ICD-10-CM

## 2015-11-05 DIAGNOSIS — C50911 Malignant neoplasm of unspecified site of right female breast: Secondary | ICD-10-CM

## 2015-11-05 MED ORDER — ZOLPIDEM TARTRATE 10 MG PO TABS: 10.0000 mg | ORAL_TABLET | Freq: Every evening | ORAL | Status: DC | PRN

## 2015-11-05 NOTE — Telephone Encounter (Signed)
Called and left a message with lab and follow up 1/6

## 2015-11-10 ENCOUNTER — Encounter: Payer: Self-pay | Admitting: Pharmacist

## 2015-11-10 DIAGNOSIS — Z5111 Encounter for antineoplastic chemotherapy: Secondary | ICD-10-CM | POA: Insufficient documentation

## 2015-11-10 NOTE — Progress Notes (Signed)
Oral Chemotherapy Pharmacist Encounter   I spoke with patient for overview of new oral chemotherapy medication: Ibrance. Pt is doing well. The prescriptions have been sent to the [place] for benefit analysis and approval. Pt does not have Medicare Part D coverage. She will sign forms with me for assistance through [place] on Friday when she is in infusion for zometa. Pt states she received a 1 month free supply through [employer] and started on Friday 12/23. Pt reports no issues or side effects and no missed doses.   Counseled patient on administration, dosing, side effects, safe handling, and monitoring. Side effects include but not limited to: Myelosuppression, fatigue, GI upset, and stomatitis.  Ms. Frady voiced understanding and appreciation.   All questions answered.  Will follow up with patient regarding assistance approval with [place]  Thank you,  Montel Clock, PharmD, [facility]

## 2015-11-12 ENCOUNTER — Ambulatory Visit (HOSPITAL_BASED_OUTPATIENT_CLINIC_OR_DEPARTMENT_OTHER): Payer: Medicare Other

## 2015-11-12 VITALS — BP 129/94 | HR 96 | Temp 97.9°F | Resp 20

## 2015-11-12 DIAGNOSIS — C50912 Malignant neoplasm of unspecified site of left female breast: Secondary | ICD-10-CM

## 2015-11-12 DIAGNOSIS — C7951 Secondary malignant neoplasm of bone: Secondary | ICD-10-CM

## 2015-11-12 DIAGNOSIS — C50911 Malignant neoplasm of unspecified site of right female breast: Secondary | ICD-10-CM

## 2015-11-12 MED ORDER — ZOLEDRONIC ACID 4 MG/100ML IV SOLN
4.0000 mg | Freq: Once | INTRAVENOUS | Status: AC
  Administered 2015-11-12: 4 mg via INTRAVENOUS
  Filled 2015-11-12: qty 100

## 2015-11-12 MED ORDER — SODIUM CHLORIDE 0.9 % IJ SOLN
10.0000 mL | INTRAMUSCULAR | Status: DC | PRN
  Administered 2015-11-12: 10 mL
  Filled 2015-11-12: qty 10

## 2015-11-12 MED ORDER — HEPARIN SOD (PORK) LOCK FLUSH 100 UNIT/ML IV SOLN
500.0000 [IU] | Freq: Once | INTRAVENOUS | Status: AC | PRN
  Administered 2015-11-12: 500 [IU]
  Filled 2015-11-12: qty 5

## 2015-11-12 MED ORDER — SODIUM CHLORIDE 0.9 % IV SOLN
Freq: Once | INTRAVENOUS | Status: AC
  Administered 2015-11-12: 12:00:00 via INTRAVENOUS

## 2015-11-12 NOTE — Patient Instructions (Signed)

## 2015-11-16 ENCOUNTER — Encounter: Payer: Self-pay | Admitting: Pharmacist

## 2015-11-16 NOTE — Progress Notes (Signed)
11/16/15: Patient approved for [place] patient assistance for 1 year of Ibrance. Effective 11/16/2015-11/15/2016. Patient should be contacted this week for delivery of Ibrance by [employer].   Thank you,  Montel Clock, PharmD, [facility]

## 2015-11-19 ENCOUNTER — Ambulatory Visit (HOSPITAL_BASED_OUTPATIENT_CLINIC_OR_DEPARTMENT_OTHER): Payer: Medicare Other | Admitting: Hematology and Oncology

## 2015-11-19 ENCOUNTER — Other Ambulatory Visit (HOSPITAL_BASED_OUTPATIENT_CLINIC_OR_DEPARTMENT_OTHER): Payer: Medicare Other

## 2015-11-19 ENCOUNTER — Encounter: Payer: Self-pay | Admitting: Hematology and Oncology

## 2015-11-19 ENCOUNTER — Telehealth: Payer: Self-pay | Admitting: Hematology and Oncology

## 2015-11-19 VITALS — BP 133/91 | HR 96 | Temp 97.7°F | Resp 18 | Ht 62.0 in | Wt 211.9 lb

## 2015-11-19 DIAGNOSIS — D702 Other drug-induced agranulocytosis: Secondary | ICD-10-CM

## 2015-11-19 DIAGNOSIS — N951 Menopausal and female climacteric states: Secondary | ICD-10-CM | POA: Diagnosis not present

## 2015-11-19 DIAGNOSIS — C7951 Secondary malignant neoplasm of bone: Secondary | ICD-10-CM

## 2015-11-19 DIAGNOSIS — C50912 Malignant neoplasm of unspecified site of left female breast: Secondary | ICD-10-CM | POA: Diagnosis not present

## 2015-11-19 DIAGNOSIS — C7989 Secondary malignant neoplasm of other specified sites: Secondary | ICD-10-CM

## 2015-11-19 DIAGNOSIS — G620 Drug-induced polyneuropathy: Secondary | ICD-10-CM

## 2015-11-19 DIAGNOSIS — C50512 Malignant neoplasm of lower-outer quadrant of left female breast: Secondary | ICD-10-CM | POA: Diagnosis not present

## 2015-11-19 DIAGNOSIS — C773 Secondary and unspecified malignant neoplasm of axilla and upper limb lymph nodes: Secondary | ICD-10-CM | POA: Diagnosis not present

## 2015-11-19 LAB — COMPREHENSIVE METABOLIC PANEL
ALT: 10 U/L (ref 0–55)
AST: 10 U/L (ref 5–34)
Albumin: 3.7 g/dL (ref 3.5–5.0)
Alkaline Phosphatase: 61 U/L (ref 40–150)
Anion Gap: 8 mEq/L (ref 3–11)
BUN: 11.3 mg/dL (ref 7.0–26.0)
CHLORIDE: 109 meq/L (ref 98–109)
CO2: 25 meq/L (ref 22–29)
Calcium: 9 mg/dL (ref 8.4–10.4)
Creatinine: 0.7 mg/dL (ref 0.6–1.1)
GLUCOSE: 96 mg/dL (ref 70–140)
POTASSIUM: 3.7 meq/L (ref 3.5–5.1)
SODIUM: 141 meq/L (ref 136–145)
Total Bilirubin: 0.43 mg/dL (ref 0.20–1.20)
Total Protein: 6.5 g/dL (ref 6.4–8.3)

## 2015-11-19 LAB — CBC WITH DIFFERENTIAL/PLATELET
BASO%: 0.6 % (ref 0.0–2.0)
BASOS ABS: 0 10*3/uL (ref 0.0–0.1)
EOS ABS: 0.1 10*3/uL (ref 0.0–0.5)
EOS%: 3.3 % (ref 0.0–7.0)
HCT: 32.9 % — ABNORMAL LOW (ref 34.8–46.6)
HGB: 11.4 g/dL — ABNORMAL LOW (ref 11.6–15.9)
LYMPH%: 31.7 % (ref 14.0–49.7)
MCH: 32.5 pg (ref 25.1–34.0)
MCHC: 34.7 g/dL (ref 31.5–36.0)
MCV: 93.7 fL (ref 79.5–101.0)
MONO#: 0.1 10*3/uL (ref 0.1–0.9)
MONO%: 3.3 % (ref 0.0–14.0)
NEUT#: 1.1 10*3/uL — ABNORMAL LOW (ref 1.5–6.5)
NEUT%: 61.1 % (ref 38.4–76.8)
Platelets: 143 10*3/uL — ABNORMAL LOW (ref 145–400)
RBC: 3.51 10*6/uL — AB (ref 3.70–5.45)
RDW: 14.4 % (ref 11.2–14.5)
WBC: 1.8 10*3/uL — AB (ref 3.9–10.3)
lymph#: 0.6 10*3/uL — ABNORMAL LOW (ref 0.9–3.3)

## 2015-11-19 NOTE — Progress Notes (Signed)
Patient Care Team: Mackie Pai, PA-C as PCP - General (Physician Assistant) Thea Silversmith, MD as Consulting Physician (Radiation Oncology) Consuela Mimes, MD as Consulting Physician (Hematology and Oncology) Nicholas Lose, MD as Consulting Physician (Hematology and Oncology)  DIAGNOSIS: Breast cancer of lower-outer quadrant of left female breast [facility])   Staging form: Breast, AJCC 7th Edition     Pathologic: Stage IIIC (T4d, N3, cM0) - Signed by [name], MD on 01/19/2014   SUMMARY OF ONCOLOGIC HISTORY:   Breast cancer of lower-outer quadrant of left female breast ([place])   12/07/2010 - 04/12/2011 Neo-Adjuvant Chemotherapy Neoadjuvant FEC x6 followed by Taxotere x4   12/26/2010 Procedure Genetic testing showed mutation for BRCA2 2041insA   05/12/2011 Surgery Bilateral mastectomy with left axillary dissection 11/21 positive lymph nodes 5.8 cm tumor ER 95% PR 0% Ki-67 61% HER-2 negative ratio 1.39 T3, N3, 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   07/03/2011 - 08/17/2011 Radiation Therapy Radiation therapy to the chest and axilla   09/11/2011 -  Anti-estrogen oral therapy Tamoxifen later switched to Arimidex 07/2012   04/22/2012 Surgery Bilateral salpingo-oophorectomy   06/10/2015 Imaging Bone scan: uptake right lateral frontal bone, left malar region, manubrium, right 6,8 ribs; CT-CAP: New extensive infiltrate left & right hilar/mediastinal, right axillary, bil lower neck LN, 3.7 cm LUL consolidation, RML nodules,Lt Pl eff, RPLN   06/18/2015 Initial Biopsy Right axillary lymph node biopsy: 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, ER 90%, PR 0%, Ki-67 60%, HER-2 negative   06/29/2015 -  Chemotherapy Halaven days 1 and 8 Q 3 weeks   11/01/2015 PET scan Focal patchy LUL opacities new/increased possibly reflecting infection or tumor, mild improving hypermet LN in Rt paratracheal and left infrahilar region, subcut mets to right shoulder and right post gluteal, ext bone mets   11/08/2015 -  Anti-estrogen oral therapy Ibrance with Letrozole    CHIEF COMPLIANT: Follow-up on Ibrance with letrozole  INTERVAL HISTORY: Tina Vaughan is a 55 year old with above-mentioned history of metastatic breast cancer currently on Ibrance with letrozole. She appears to be tolerating Ibrance very well. She has been on it for the past 2 weeks. She reports very mild discomfort of the tips of the fingers. Otherwise she is without any complaints or concerns. She has hot flashes related to letrozole.  REVIEW OF SYSTEMS:   Constitutional: Denies fevers, chills or abnormal weight loss Eyes: Denies blurriness of vision Ears, nose, mouth, throat, and face: Denies mucositis or sore throat Respiratory: Denies cough, dyspnea or wheezes Cardiovascular: Denies palpitation, chest discomfort Gastrointestinal:  Denies nausea, heartburn or change in bowel habits Skin: Denies abnormal skin rashes Lymphatics: Denies new lymphadenopathy or easy bruising Neurological: Mild peripheral neuropathy Behavioral/Psych: Mood is stable, no new changes  Extremities: No lower extremity edema All other systems were reviewed with the patient and are negative.  I have reviewed the past medical history, past surgical history, social history and family history with the patient and they are unchanged from previous note.  ALLERGIES:  is allergic to doxycycline hydrochloride.  MEDICATIONS:  Current Outpatient Prescriptions  Medication Sig Dispense Refill  . buPROPion (WELLBUTRIN XL) 150 MG 24 hr tablet   0  . Calcium 200 MG TABS Take 200 mg by mouth daily.     . cholecalciferol (VITAMIN D) 1000 UNITS tablet Take 1,000 Units by mouth daily.     . citalopram (CELEXA) 40 MG tablet TAKE 1 TABLET BY MOUTH DAILY. 30 tablet 5  . dexamethasone (DECADRON) 4 MG  tablet Take 0.5 tablets (2 mg total) by mouth daily. 30 tablet 1  . fluocinonide cream (LIDEX) 2.41 % Apply 1 application topically 2 (two) times daily as needed (for  rash.).    Marland Kitchen gabapentin (NEURONTIN) 300 MG capsule Take 1 capsule (300 mg total) by mouth daily. 30 capsule 5  . ibuprofen (ADVIL,MOTRIN) 200 MG tablet Take 600 mg by mouth every 8 (eight) hours as needed for moderate pain (pain).     Marland Kitchen letrozole (FEMARA) 2.5 MG tablet Take 1 tablet (2.5 mg total) by mouth daily. 90 tablet 0  . lidocaine-prilocaine (EMLA) cream Apply to affected area once (Patient taking differently: Apply 1 application topically daily as needed. For port) 30 g 3  . LORazepam (ATIVAN) 1 MG tablet   0  . naproxen sodium (ANAPROX) 220 MG tablet Take 220-440 mg by mouth 2 (two) times daily as needed (for muscle aches.).     Marland Kitchen omeprazole (PRILOSEC) 20 MG capsule Take 1 capsule (20 mg total) by mouth daily. Take 2 x  30-60 min before first meal of the day 30 capsule 3  . ondansetron (ZOFRAN) 8 MG tablet TAKE 1 TABLET BY MOUTH 2 TIMES DAILY. START THE DAY AFTER CHEMO FOR 2 DAYS. THEN TAKE AS NEEDED FOR NAUSEA OR VOMITING. 30 tablet 1  . oxyCODONE-acetaminophen (PERCOCET/ROXICET) 5-325 MG tablet Take 1 tablet by mouth 2 (two) times daily as needed for severe pain. 30 tablet 0  . palbociclib (IBRANCE) 125 MG capsule Take 1 capsule (125 mg total) by mouth daily with breakfast. Take whole with food. 21 capsule 0  . ranitidine (ZANTAC) 150 MG tablet 2 at bedtime    . UNABLE TO FIND Dispense compression sleeve 20-30 mm for this patient with history of breast cancer s/p surgery 1 each 0  . zolpidem (AMBIEN) 10 MG tablet Take 1 tablet (10 mg total) by mouth at bedtime as needed for sleep. 30 tablet 0   No current facility-administered medications for this visit.    PHYSICAL EXAMINATION: ECOG PERFORMANCE STATUS: 1 - Symptomatic but completely ambulatory  Filed Vitals:   11/19/15 1044  BP: 133/91  Pulse: 96  Temp: 97.7 F (36.5 C)  Resp: 18   Filed Weights   11/19/15 1044  Weight: 211 lb 14.4 oz (96.117 kg)    GENERAL:alert, no distress and comfortable SKIN: skin color, texture,  turgor are normal, no rashes or significant lesions EYES: normal, Conjunctiva are pink and non-injected, sclera clear OROPHARYNX:no exudate, no erythema and lips, buccal mucosa, and tongue normal  NECK: supple, thyroid normal size, non-tender, without nodularity LYMPH:  no palpable lymphadenopathy in the cervical, axillary or inguinal LUNGS: clear to auscultation and percussion with normal breathing effort HEART: regular rate & rhythm and no murmurs and no lower extremity edema ABDOMEN:abdomen soft, non-tender and normal bowel sounds MUSCULOSKELETAL:no cyanosis of digits and no clubbing  NEURO: alert & oriented x 3 with fluent speech, grade 1 peripheral neuropathy  EXTREMITIES: No lower extremity edema  LABORATORY DATA:  I have reviewed the data as listed   Chemistry      Component Value Date/Time   NA 141 11/19/2015 1035   NA 141 07/31/2015 1929   K 3.7 11/19/2015 1035   K 3.1* 07/31/2015 1929   CL 106 07/31/2015 1929   CL 108* 07/30/2012 1430   CO2 25 11/19/2015 1035   CO2 24 07/31/2015 1929   BUN 11.3 11/19/2015 1035   BUN 8 07/31/2015 1929   CREATININE 0.7 11/19/2015 1035  CREATININE 0.73 07/31/2015 1929      Component Value Date/Time   CALCIUM 9.0 11/19/2015 1035   CALCIUM 9.4 07/31/2015 1929   ALKPHOS 61 11/19/2015 1035   ALKPHOS 90 07/31/2015 1929   AST 10 11/19/2015 1035   AST 31 07/31/2015 1929   ALT 10 11/19/2015 1035   ALT 36 07/31/2015 1929   BILITOT 0.43 11/19/2015 1035   BILITOT 0.8 07/31/2015 1929       Lab Results  Component Value Date   WBC 1.8* 11/19/2015   HGB 11.4* 11/19/2015   HCT 32.9* 11/19/2015   MCV 93.7 11/19/2015   PLT 143* 11/19/2015   NEUTROABS 1.1* 11/19/2015     ASSESSMENT & PLAN:  Breast cancer of lower-outer quadrant of left female breast ([place]) Right axillary lymph node biopsy 06/18/2015: 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 BRCA 2 mutation underwent salpingo-oophorectomy Status post bilateral mastectomies and radiation to left chest wall and axilla currently on tamoxifen since October 2012) Prior treatment: Halaven day 1 and day 8 every 3 weeks 6 cycles started 06/29/2015 and completed 10/19/2015 PET/CT scan 11/01/2015:Focal patchy LUL opacities new/increased possibly reflecting infection or tumor, mild improving hypermet LN in Rt paratracheal and left infrahilar region, subcut mets to right shoulder and right post gluteal, ext bone mets ------------------------------------------------------------------------------------------------------------------------------------------------------- Current treatment: letrozole with Ibrance along with Zometa started 11/12/2015  Ibrance toxicities: 1. Peripheral Neuropathy 2. Neutropenia: grade 1 3. Hot flashes related to letrozole  Her blood count was reviewed and ANC is 1100. So we will keep the dosage the same.  Zometa to be given today Q monthly  Return to clinic 2 weeks to check blood counts and follow-up.   Orders Placed This Encounter  Procedures  . CBC with Differential    Standing Status: Future     Number of Occurrences:      Standing Expiration Date: 11/18/2016  . Lactate dehydrogenase (LDH) - [facility]    Standing Status: Future     Number of Occurrences:      Standing Expiration Date: 11/18/2016   The patient has a good understanding of the overall plan. she agrees with it. she will call with any problems that may develop before the next visit here.   Rulon Eisenmenger, MD 11/19/2015

## 2015-11-19 NOTE — Telephone Encounter (Signed)
Appointments made and avs printed for patient °

## 2015-11-19 NOTE — Assessment & Plan Note (Signed)
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 BRCA 2 mutation underwent salpingo-oophorectomy Status post bilateral mastectomies and radiation to left chest wall and axilla currently on tamoxifen since October 2012) Prior treatment: Halaven day 1 and day 8 every 3 weeks 6 cycles started 06/29/2015 and completed 10/19/2015 PET/CT scan 11/01/2015:Focal patchy LUL opacities new/increased possibly reflecting infection or tumor, mild improving hypermet LN in Rt paratracheal and left infrahilar region, subcut mets to right shoulder and right post gluteal, ext bone mets ------------------------------------------------------------------------------------------------------------------------------------------------------- Current treatment: letrozole with Ibrance along with Zometa started 11/12/2015  Ibrance toxicities:  Zometa to be given today Q monthly  Return to clinic 3 weeks to check blood counts and follow-up. 

## 2015-11-19 NOTE — Addendum Note (Signed)
Addended by: Prentiss Bells on: 11/19/2015 02:37 PM   Modules accepted: Medications

## 2015-11-23 ENCOUNTER — Telehealth: Payer: Self-pay | Admitting: Pharmacist

## 2015-11-23 NOTE — Telephone Encounter (Signed)
11/23/15: Attempted to reach patient for follow up on oral medication: Ibrance. No answer. Left VM for patient to call back with any questions or issues.   Thank you,  Montel Clock, PharmD, [facility] [phone]

## 2015-11-30 ENCOUNTER — Ambulatory Visit: Payer: Medicare Other | Admitting: Hematology and Oncology

## 2015-11-30 ENCOUNTER — Ambulatory Visit: Payer: Medicare Other

## 2015-11-30 ENCOUNTER — Other Ambulatory Visit: Payer: Medicare Other

## 2015-12-01 NOTE — Assessment & Plan Note (Signed)
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 BRCA 2 mutation underwent salpingo-oophorectomy Status post bilateral mastectomies and radiation to left chest wall and axilla currently on tamoxifen since October 2012) Prior treatment: Halaven day 1 and day 8 every 3 weeks 6 cycles started 06/29/2015 and completed 10/19/2015 PET/CT scan 11/01/2015:Focal patchy LUL opacities new/increased possibly reflecting infection or tumor, mild improving hypermet LN in Rt paratracheal and left infrahilar region, subcut mets to right shoulder and right post gluteal, ext bone mets ------------------------------------------------------------------------------------------------------------------------------------------------------- Current treatment: letrozole with Ibrance along with Zometa started 11/12/2015  Ibrance toxicities: 1. Peripheral Neuropathy 2. Neutropenia: grade 1 3. Hot flashes related to letrozole  Her blood count was reviewed and ANC is 1100. So we will keep the dosage the same.  Zometa to be given today Q monthly Return to clinic in 2 weeks to start cycle 2 after that we will see her once a month.

## 2015-12-02 ENCOUNTER — Ambulatory Visit (HOSPITAL_BASED_OUTPATIENT_CLINIC_OR_DEPARTMENT_OTHER): Payer: Medicare Other | Admitting: Hematology and Oncology

## 2015-12-02 ENCOUNTER — Telehealth: Payer: Self-pay | Admitting: Hematology and Oncology

## 2015-12-02 ENCOUNTER — Encounter: Payer: Self-pay | Admitting: Hematology and Oncology

## 2015-12-02 ENCOUNTER — Other Ambulatory Visit (HOSPITAL_BASED_OUTPATIENT_CLINIC_OR_DEPARTMENT_OTHER): Payer: Medicare Other

## 2015-12-02 VITALS — BP 115/88 | HR 94 | Temp 98.2°F | Resp 18 | Ht 62.0 in | Wt 208.6 lb

## 2015-12-02 DIAGNOSIS — C773 Secondary and unspecified malignant neoplasm of axilla and upper limb lymph nodes: Secondary | ICD-10-CM | POA: Diagnosis not present

## 2015-12-02 DIAGNOSIS — C50912 Malignant neoplasm of unspecified site of left female breast: Secondary | ICD-10-CM

## 2015-12-02 DIAGNOSIS — C50512 Malignant neoplasm of lower-outer quadrant of left female breast: Secondary | ICD-10-CM | POA: Diagnosis not present

## 2015-12-02 DIAGNOSIS — D702 Other drug-induced agranulocytosis: Secondary | ICD-10-CM

## 2015-12-02 DIAGNOSIS — G620 Drug-induced polyneuropathy: Secondary | ICD-10-CM

## 2015-12-02 DIAGNOSIS — C7989 Secondary malignant neoplasm of other specified sites: Secondary | ICD-10-CM | POA: Diagnosis not present

## 2015-12-02 DIAGNOSIS — N951 Menopausal and female climacteric states: Secondary | ICD-10-CM

## 2015-12-02 DIAGNOSIS — C7951 Secondary malignant neoplasm of bone: Principal | ICD-10-CM

## 2015-12-02 DIAGNOSIS — R0602 Shortness of breath: Secondary | ICD-10-CM

## 2015-12-02 DIAGNOSIS — C50919 Malignant neoplasm of unspecified site of unspecified female breast: Secondary | ICD-10-CM

## 2015-12-02 LAB — CBC WITH DIFFERENTIAL/PLATELET
BASO%: 0.9 % (ref 0.0–2.0)
Basophils Absolute: 0 10*3/uL (ref 0.0–0.1)
EOS%: 1.3 % (ref 0.0–7.0)
Eosinophils Absolute: 0 10*3/uL (ref 0.0–0.5)
HCT: 34 % — ABNORMAL LOW (ref 34.8–46.6)
HGB: 12 g/dL (ref 11.6–15.9)
LYMPH%: 39.5 % (ref 14.0–49.7)
MCH: 33.1 pg (ref 25.1–34.0)
MCHC: 35.3 g/dL (ref 31.5–36.0)
MCV: 93.9 fL (ref 79.5–101.0)
MONO#: 0.3 10*3/uL (ref 0.1–0.9)
MONO%: 12.1 % (ref 0.0–14.0)
NEUT#: 1 10*3/uL — ABNORMAL LOW (ref 1.5–6.5)
NEUT%: 46.2 % (ref 38.4–76.8)
Platelets: 130 10*3/uL — ABNORMAL LOW (ref 145–400)
RBC: 3.62 10*6/uL — ABNORMAL LOW (ref 3.70–5.45)
RDW: 15.6 % — ABNORMAL HIGH (ref 11.2–14.5)
WBC: 2.2 10*3/uL — ABNORMAL LOW (ref 3.9–10.3)
lymph#: 0.9 10*3/uL (ref 0.9–3.3)

## 2015-12-02 LAB — COMPREHENSIVE METABOLIC PANEL
ALT: 10 U/L (ref 0–55)
AST: 11 U/L (ref 5–34)
Albumin: 4 g/dL (ref 3.5–5.0)
Alkaline Phosphatase: 73 U/L (ref 40–150)
Anion Gap: 6 mEq/L (ref 3–11)
BUN: 9.6 mg/dL (ref 7.0–26.0)
CHLORIDE: 110 meq/L — AB (ref 98–109)
CO2: 27 meq/L (ref 22–29)
CREATININE: 0.7 mg/dL (ref 0.6–1.1)
Calcium: 9 mg/dL (ref 8.4–10.4)
EGFR: >90 mL/min/{1.73_m2} (ref >90)
GLUCOSE: 95 mg/dL (ref 70–140)
POTASSIUM: 4.1 meq/L (ref 3.5–5.1)
SODIUM: 143 meq/L (ref 136–145)
Total Bilirubin: 0.5 mg/dL (ref 0.20–1.20)
Total Protein: 6.7 g/dL (ref 6.4–8.3)

## 2015-12-02 LAB — LACTATE DEHYDROGENASE: LDH: 146 U/L (ref 125–245)

## 2015-12-02 MED ORDER — OXYCODONE-ACETAMINOPHEN 5-325 MG PO TABS: 1.0000 | ORAL_TABLET | Freq: Two times a day (BID) | ORAL | Status: DC | PRN

## 2015-12-02 NOTE — Telephone Encounter (Signed)
Gave patient avs report and appointment for January and February.

## 2015-12-02 NOTE — Progress Notes (Signed)
Patient Care Team: Mackie Pai, PA-C as PCP - General (Physician Assistant) Thea Silversmith, MD as Consulting Physician (Radiation Oncology) Consuela Mimes, MD as Consulting Physician (Hematology and Oncology) Nicholas Lose, MD as Consulting Physician (Hematology and Oncology)  DIAGNOSIS: Breast cancer of lower-outer quadrant of left female breast [facility])   Staging form: Breast, AJCC 7th Edition     Pathologic: Stage IIIC (T4d, N3, cM0) - Signed by [name], MD on 01/19/2014   SUMMARY OF ONCOLOGIC HISTORY:   Breast cancer of lower-outer quadrant of left female breast ([place])   12/07/2010 - 04/12/2011 Neo-Adjuvant Chemotherapy Neoadjuvant FEC x6 followed by Taxotere x4   12/26/2010 Procedure Genetic testing showed mutation for BRCA2 2041insA   05/12/2011 Surgery Bilateral mastectomy with left axillary dissection 11/21 positive lymph nodes 5.8 cm tumor ER 95% PR 0% Ki-67 61% HER-2 negative ratio 1.39 T3, N3, 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   07/03/2011 - 08/17/2011 Radiation Therapy Radiation therapy to the chest and axilla   09/11/2011 -  Anti-estrogen oral therapy Tamoxifen later switched to Arimidex 07/2012   04/22/2012 Surgery Bilateral salpingo-oophorectomy   06/10/2015 Imaging Bone scan: uptake right lateral frontal bone, left malar region, manubrium, right 6,8 ribs; CT-CAP: New extensive infiltrate left & right hilar/mediastinal, right axillary, bil lower neck LN, 3.7 cm LUL consolidation, RML nodules,Lt Pl eff, RPLN   06/18/2015 Initial Biopsy Right axillary lymph node biopsy: 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, ER 90%, PR 0%, Ki-67 60%, HER-2 negative   06/29/2015 -  Chemotherapy Halaven days 1 and 8 Q 3 weeks   11/01/2015 PET scan Focal patchy LUL opacities new/increased possibly reflecting infection or tumor, mild improving hypermet LN in Rt paratracheal and left infrahilar region, subcut mets to right shoulder and right post gluteal, ext bone mets   11/08/2015 -  Anti-estrogen oral therapy Ibrance with Letrozole    CHIEF COMPLIANT: metastatic breast cancer and Ibrance with letrozole  INTERVAL HISTORY: YASLENE LINDAMOOD is a 55 year old with above-mentioned history metastatic breast cancer with extensive bone metastases as well as lung infiltrates is currently on Ibrance with letrozole. She appears to be tolerating the treatment fairly well. She does have mild fatigue and her major complaint is chronic shortness of breath exertion which appears to get worse towards the end of the day. She does have a dry cough. Denies any fevers or chills. She does have bone pain for which she takes Percocets.  REVIEW OF SYSTEMS:   Constitutional: Denies fevers, chills or abnormal weight loss Eyes: Denies blurriness of vision Ears, nose, mouth, throat, and face: Denies mucositis or sore throat Respiratory: Denies cough, dyspnea or wheezes Cardiovascular: Denies palpitation, chest discomfort Gastrointestinal:  Denies nausea, heartburn or change in bowel habits Skin: Denies abnormal skin rashes Lymphatics: Denies new lymphadenopathy or easy bruising Neurological:Denies numbness, tingling or new weaknesses Behavioral/Psych: Mood is stable, no new changes  Extremities: No lower extremity edema All other systems were reviewed with the patient and are negative.  I have reviewed the past medical history, past surgical history, social history and family history with the patient and they are unchanged from previous note.  ALLERGIES:  is allergic to doxycycline hydrochloride.  MEDICATIONS:  Current Outpatient Prescriptions  Medication Sig Dispense Refill  . buPROPion (WELLBUTRIN XL) 150 MG 24 hr tablet   0  . Calcium 200 MG TABS Take 200 mg by mouth daily.     . cholecalciferol (VITAMIN D) 1000 UNITS tablet Take 1,000 Units by mouth daily.     Marland Kitchen  citalopram (CELEXA) 40 MG tablet TAKE 1 TABLET BY MOUTH DAILY. 30 tablet 5  . dexamethasone (DECADRON) 4 MG tablet Take  0.5 tablets (2 mg total) by mouth daily. 30 tablet 1  . fluocinonide cream (LIDEX) 0.10 % Apply 1 application topically 2 (two) times daily as needed (for rash.).    Marland Kitchen gabapentin (NEURONTIN) 300 MG capsule Take 1 capsule (300 mg total) by mouth daily. 30 capsule 5  . ibuprofen (ADVIL,MOTRIN) 200 MG tablet Take 600 mg by mouth every 8 (eight) hours as needed for moderate pain (pain).     Marland Kitchen letrozole (FEMARA) 2.5 MG tablet Take 1 tablet (2.5 mg total) by mouth daily. 90 tablet 0  . lidocaine-prilocaine (EMLA) cream Apply to affected area once (Patient taking differently: Apply 1 application topically daily as needed. For port) 30 g 3  . LORazepam (ATIVAN) 1 MG tablet   0  . naproxen sodium (ANAPROX) 220 MG tablet Take 220-440 mg by mouth 2 (two) times daily as needed (for muscle aches.).     Marland Kitchen omeprazole (PRILOSEC) 20 MG capsule Take 1 capsule (20 mg total) by mouth daily. Take 2 x  30-60 min before first meal of the day 30 capsule 3  . ondansetron (ZOFRAN) 8 MG tablet TAKE 1 TABLET BY MOUTH 2 TIMES DAILY. START THE DAY AFTER CHEMO FOR 2 DAYS. THEN TAKE AS NEEDED FOR NAUSEA OR VOMITING. 30 tablet 1  . oxyCODONE-acetaminophen (PERCOCET/ROXICET) 5-325 MG tablet Take 1 tablet by mouth 2 (two) times daily as needed for severe pain. 30 tablet 0  . palbociclib (IBRANCE) 125 MG capsule Take 1 capsule (125 mg total) by mouth daily with breakfast. Take whole with food. 21 capsule 0  . ranitidine (ZANTAC) 150 MG tablet 2 at bedtime    . UNABLE TO FIND Dispense compression sleeve 20-30 mm for this patient with history of breast cancer s/p surgery 1 each 0  . zolpidem (AMBIEN) 10 MG tablet Take 1 tablet (10 mg total) by mouth at bedtime as needed for sleep. 30 tablet 0   No current facility-administered medications for this visit.    PHYSICAL EXAMINATION: ECOG PERFORMANCE STATUS: 1 - Symptomatic but completely ambulatory  Filed Vitals:   12/02/15 1131  BP: 115/88  Pulse: 94  Temp: 98.2 F (36.8 C)    Resp: 18   Filed Weights   12/02/15 1131  Weight: 208 lb 9.6 oz (94.62 kg)    GENERAL:alert, no distress and comfortable SKIN: skin color, texture, turgor are normal, no rashes or significant lesions EYES: normal, Conjunctiva are pink and non-injected, sclera clear OROPHARYNX:no exudate, no erythema and lips, buccal mucosa, and tongue normal  NECK: supple, thyroid normal size, non-tender, without nodularity LYMPH:  no palpable lymphadenopathy in the cervical, axillary or inguinal LUNGS: clear to auscultation and percussion with normal breathing effort HEART: regular rate & rhythm and no murmurs and no lower extremity edema ABDOMEN:abdomen soft, non-tender and normal bowel sounds MUSCULOSKELETAL:no cyanosis of digits and no clubbing  NEURO: alert & oriented x 3 with fluent speech, no focal motor/sensory deficits EXTREMITIES: No lower extremity edema  LABORATORY DATA:  I have reviewed the data as listed   Chemistry      Component Value Date/Time   NA 143 12/02/2015 1111   NA 141 07/31/2015 1929   K 4.1 12/02/2015 1111   K 3.1* 07/31/2015 1929   CL 106 07/31/2015 1929   CL 108* 07/30/2012 1430   CO2 27 12/02/2015 1111   CO2 24 07/31/2015 1929  BUN 9.6 12/02/2015 1111   BUN 8 07/31/2015 1929   CREATININE 0.7 12/02/2015 1111   CREATININE 0.73 07/31/2015 1929      Component Value Date/Time   CALCIUM 9.0 12/02/2015 1111   CALCIUM 9.4 07/31/2015 1929   ALKPHOS 73 12/02/2015 1111   ALKPHOS 90 07/31/2015 1929   AST 11 12/02/2015 1111   AST 31 07/31/2015 1929   ALT 10 12/02/2015 1111   ALT 36 07/31/2015 1929   BILITOT 0.50 12/02/2015 1111   BILITOT 0.8 07/31/2015 1929       Lab Results  Component Value Date   WBC 2.2* 12/02/2015   HGB 12.0 12/02/2015   HCT 34.0* 12/02/2015   MCV 93.9 12/02/2015   PLT 130* 12/02/2015   NEUTROABS 1.0* 12/02/2015     ASSESSMENT & PLAN:  Breast cancer of lower-outer quadrant of left female breast ([place]) Right axillary lymph node  biopsy 06/18/2015: 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 BRCA 2 mutation underwent salpingo-oophorectomy Status post bilateral mastectomies and radiation to left chest wall and axilla currently on tamoxifen since October 2012) Prior treatment: Halaven day 1 and day 8 every 3 weeks 6 cycles started 06/29/2015 and completed 10/19/2015 PET/CT scan 11/01/2015:Focal patchy LUL opacities new/increased possibly reflecting infection or tumor, mild improving hypermet LN in Rt paratracheal and left infrahilar region, subcut mets to right shoulder and right post gluteal, ext bone mets ------------------------------------------------------------------------------------------------------------------------------------------------------- Current treatment: letrozole with Ibrance along with Zometa started 11/12/2015  Ibrance toxicities: 1. Peripheral Neuropathy 2. Neutropenia: grade 1 3. Hot flashes related to letrozole   Shortness of breath: Probably related to malignancy. Her symptoms sent to get worse towards the end of the day. She had seen pulmonology previously. She does not want to go back to seeing them again at this time.  Her blood count was reviewed and ANC is 1000. So we will keep the dosage the same.  Bone metastases: Zometa Q monthly, calcium plus vitamin D, pain from the bone metastases improved with Percocets. I renewed her prescription today.  Return to clinic in 2 weeks to [place] blood work   No orders of the defined types were  placed in this encounter.   The patient has a good understanding of the overall plan. she agrees with it. she will call with any problems that may develop before the next visit here.   Rulon Eisenmenger, MD 12/02/2015

## 2015-12-09 ENCOUNTER — Other Ambulatory Visit: Payer: Self-pay | Admitting: Hematology and Oncology

## 2015-12-09 NOTE — Telephone Encounter (Signed)
Chart reviewed.

## 2015-12-10 ENCOUNTER — Other Ambulatory Visit: Payer: Medicare Other | Admitting: *Deleted

## 2015-12-10 ENCOUNTER — Other Ambulatory Visit (HOSPITAL_BASED_OUTPATIENT_CLINIC_OR_DEPARTMENT_OTHER): Payer: Medicare Other

## 2015-12-10 ENCOUNTER — Ambulatory Visit (HOSPITAL_BASED_OUTPATIENT_CLINIC_OR_DEPARTMENT_OTHER): Payer: Medicare Other

## 2015-12-10 ENCOUNTER — Other Ambulatory Visit: Payer: Self-pay

## 2015-12-10 VITALS — BP 114/82 | HR 95 | Temp 98.5°F | Resp 20

## 2015-12-10 DIAGNOSIS — C50512 Malignant neoplasm of lower-outer quadrant of left female breast: Secondary | ICD-10-CM

## 2015-12-10 DIAGNOSIS — C7951 Secondary malignant neoplasm of bone: Secondary | ICD-10-CM | POA: Diagnosis present

## 2015-12-10 DIAGNOSIS — C773 Secondary and unspecified malignant neoplasm of axilla and upper limb lymph nodes: Secondary | ICD-10-CM | POA: Diagnosis not present

## 2015-12-10 DIAGNOSIS — C50912 Malignant neoplasm of unspecified site of left female breast: Secondary | ICD-10-CM | POA: Diagnosis not present

## 2015-12-10 LAB — COMPREHENSIVE METABOLIC PANEL
ALBUMIN: 4.2 g/dL (ref 3.5–5.0)
ALK PHOS: 73 U/L (ref 40–150)
ALT: 12 U/L (ref 0–55)
AST: 11 U/L (ref 5–34)
Anion Gap: 8 mEq/L (ref 3–11)
BILIRUBIN TOTAL: 0.5 mg/dL (ref 0.20–1.20)
BUN: 13.7 mg/dL (ref 7.0–26.0)
CALCIUM: 9.1 mg/dL (ref 8.4–10.4)
CO2: 24 mEq/L (ref 22–29)
CREATININE: 0.9 mg/dL (ref 0.6–1.1)
Chloride: 109 mEq/L (ref 98–109)
EGFR: 76 mL/min/{1.73_m2} — ABNORMAL LOW (ref >90)
Glucose: 97 mg/dl (ref 70–140)
Potassium: 4.3 mEq/L (ref 3.5–5.1)
Sodium: 141 mEq/L (ref 136–145)
TOTAL PROTEIN: 7.3 g/dL (ref 6.4–8.3)

## 2015-12-10 LAB — CBC WITH DIFFERENTIAL/PLATELET
BASO%: 1.4 % (ref 0.0–2.0)
Basophils Absolute: 0 10*3/uL (ref 0.0–0.1)
EOS%: 1 % (ref 0.0–7.0)
Eosinophils Absolute: 0 10*3/uL (ref 0.0–0.5)
HEMATOCRIT: 39 % (ref 34.8–46.6)
HEMOGLOBIN: 13.2 g/dL (ref 11.6–15.9)
LYMPH#: 0.9 10*3/uL (ref 0.9–3.3)
LYMPH%: 24.8 % (ref 14.0–49.7)
MCH: 32 pg (ref 25.1–34.0)
MCHC: 33.8 g/dL (ref 31.5–36.0)
MCV: 94.9 fL (ref 79.5–101.0)
MONO#: 0.1 10*3/uL (ref 0.1–0.9)
MONO%: 4.1 % (ref 0.0–14.0)
NEUT#: 2.5 10*3/uL (ref 1.5–6.5)
NEUT%: 68.7 % (ref 38.4–76.8)
PLATELETS: 309 10*3/uL (ref 145–400)
RBC: 4.11 10*6/uL (ref 3.70–5.45)
RDW: 16 % — AB (ref 11.2–14.5)
WBC: 3.6 10*3/uL — ABNORMAL LOW (ref 3.9–10.3)

## 2015-12-10 MED ORDER — ZOLEDRONIC ACID 4 MG/100ML IV SOLN
4.0000 mg | Freq: Once | INTRAVENOUS | Status: AC
  Administered 2015-12-10: 4 mg via INTRAVENOUS
  Filled 2015-12-10: qty 100

## 2015-12-10 MED ORDER — SODIUM CHLORIDE 0.9 % IJ SOLN
3.0000 mL | Freq: Once | INTRAMUSCULAR | Status: DC | PRN
  Filled 2015-12-10: qty 10

## 2015-12-10 MED ORDER — HEPARIN SOD (PORK) LOCK FLUSH 100 UNIT/ML IV SOLN
500.0000 [IU] | Freq: Once | INTRAVENOUS | Status: AC | PRN
  Administered 2015-12-10: 500 [IU]
  Filled 2015-12-10: qty 5

## 2015-12-10 MED ORDER — PALBOCICLIB 125 MG PO CAPS: 125.0000 mg | ORAL_CAPSULE | Freq: Every day | ORAL | Status: DC

## 2015-12-10 MED ORDER — HEPARIN SOD (PORK) LOCK FLUSH 100 UNIT/ML IV SOLN
250.0000 [IU] | Freq: Once | INTRAVENOUS | Status: DC | PRN
  Filled 2015-12-10: qty 5

## 2015-12-10 MED ORDER — SODIUM CHLORIDE 0.9 % IV SOLN
INTRAVENOUS | Status: DC
  Administered 2015-12-10: 12:00:00 via INTRAVENOUS

## 2015-12-10 MED ORDER — ALTEPLASE 2 MG IJ SOLR
2.0000 mg | Freq: Once | INTRAMUSCULAR | Status: DC | PRN
  Filled 2015-12-10: qty 2

## 2015-12-10 MED ORDER — SODIUM CHLORIDE 0.9 % IJ SOLN
10.0000 mL | INTRAMUSCULAR | Status: DC | PRN
  Administered 2015-12-10: 10 mL
  Filled 2015-12-10: qty 10

## 2015-12-10 NOTE — Progress Notes (Signed)
Pt reported that she has had some diarrhea over last few days > 4 stools/day & had not taken anything.  She is on [place].  Instructed to take imodium per instructions on box & to report to Dr Lindi Adie if no better.  Reported to Dr Geralyn Flash nurse.

## 2015-12-10 NOTE — Patient Instructions (Signed)

## 2015-12-15 NOTE — Assessment & Plan Note (Signed)
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 BRCA 2 mutation underwent salpingo-oophorectomy Status post bilateral mastectomies and radiation to left chest wall and axilla currently on tamoxifen since October 2012) Prior treatment: Halaven day 1 and day 8 every 3 weeks 6 cycles started 06/29/2015 and completed 10/19/2015 PET/CT scan 11/01/2015:Focal patchy LUL opacities new/increased possibly reflecting infection or tumor, mild improving hypermet LN in Rt paratracheal and left infrahilar region, subcut mets to right shoulder and right post gluteal, ext bone mets ------------------------------------------------------------------------------------------------------------------------------------------------------- Current treatment: letrozole with Ibrance along with Zometa started 11/12/2015  Ibrance toxicities: 1. Peripheral Neuropathy 2. Neutropenia: grade 1 3. Hot flashes related to letrozole   Shortness of breath: Probably related to malignancy. Her symptoms sent to get worse towards the end of the day. She had seen pulmonology previously. She does not want to go back to seeing them again at this time.  Her blood count was reviewed and ANC is 1000. So we will keep the dosage the same.  Bone metastases: Zometa Q monthly, calcium plus vitamin D, pain from the bone metastases improved with Percocets. I renewed her prescription  today.  Return to clinic in 2 weeks to [place] blood work

## 2015-12-16 ENCOUNTER — Ambulatory Visit (HOSPITAL_BASED_OUTPATIENT_CLINIC_OR_DEPARTMENT_OTHER): Payer: Medicare Other | Admitting: Hematology and Oncology

## 2015-12-16 ENCOUNTER — Other Ambulatory Visit (HOSPITAL_BASED_OUTPATIENT_CLINIC_OR_DEPARTMENT_OTHER): Payer: Medicare Other

## 2015-12-16 ENCOUNTER — Encounter: Payer: Self-pay | Admitting: Hematology and Oncology

## 2015-12-16 ENCOUNTER — Telehealth: Payer: Self-pay | Admitting: Hematology and Oncology

## 2015-12-16 DIAGNOSIS — R0602 Shortness of breath: Secondary | ICD-10-CM

## 2015-12-16 DIAGNOSIS — G620 Drug-induced polyneuropathy: Secondary | ICD-10-CM | POA: Diagnosis not present

## 2015-12-16 DIAGNOSIS — M542 Cervicalgia: Secondary | ICD-10-CM | POA: Diagnosis not present

## 2015-12-16 DIAGNOSIS — C773 Secondary and unspecified malignant neoplasm of axilla and upper limb lymph nodes: Secondary | ICD-10-CM

## 2015-12-16 DIAGNOSIS — D701 Agranulocytosis secondary to cancer chemotherapy: Secondary | ICD-10-CM

## 2015-12-16 DIAGNOSIS — C50912 Malignant neoplasm of unspecified site of left female breast: Secondary | ICD-10-CM

## 2015-12-16 DIAGNOSIS — C7951 Secondary malignant neoplasm of bone: Principal | ICD-10-CM

## 2015-12-16 DIAGNOSIS — N951 Menopausal and female climacteric states: Secondary | ICD-10-CM

## 2015-12-16 DIAGNOSIS — C50512 Malignant neoplasm of lower-outer quadrant of left female breast: Secondary | ICD-10-CM

## 2015-12-16 DIAGNOSIS — C50919 Malignant neoplasm of unspecified site of unspecified female breast: Secondary | ICD-10-CM

## 2015-12-16 LAB — COMPREHENSIVE METABOLIC PANEL
ALBUMIN: 4 g/dL (ref 3.5–5.0)
ALK PHOS: 70 U/L (ref 40–150)
ALT: 12 U/L (ref 0–55)
AST: 11 U/L (ref 5–34)
Anion Gap: 9 mEq/L (ref 3–11)
BILIRUBIN TOTAL: 0.33 mg/dL (ref 0.20–1.20)
BUN: 12.7 mg/dL (ref 7.0–26.0)
CO2: 21 mEq/L — ABNORMAL LOW (ref 22–29)
Calcium: 8.9 mg/dL (ref 8.4–10.4)
Chloride: 110 mEq/L — ABNORMAL HIGH (ref 98–109)
Creatinine: 0.8 mg/dL (ref 0.6–1.1)
EGFR: 85 mL/min/{1.73_m2} — ABNORMAL LOW (ref >90)
GLUCOSE: 103 mg/dL (ref 70–140)
POTASSIUM: 4.1 meq/L (ref 3.5–5.1)
SODIUM: 141 meq/L (ref 136–145)
TOTAL PROTEIN: 6.8 g/dL (ref 6.4–8.3)

## 2015-12-16 LAB — CBC WITH DIFFERENTIAL/PLATELET
BASO%: 1.8 % (ref 0.0–2.0)
Basophils Absolute: 0 10*3/uL (ref 0.0–0.1)
EOS%: 1.9 % (ref 0.0–7.0)
Eosinophils Absolute: 0 10*3/uL (ref 0.0–0.5)
HCT: 36.8 % (ref 34.8–46.6)
HEMOGLOBIN: 12.6 g/dL (ref 11.6–15.9)
LYMPH%: 36.7 % (ref 14.0–49.7)
MCH: 32.3 pg (ref 25.1–34.0)
MCHC: 34.2 g/dL (ref 31.5–36.0)
MCV: 94.6 fL (ref 79.5–101.0)
MONO#: 0.1 10*3/uL (ref 0.1–0.9)
MONO%: 3.9 % (ref 0.0–14.0)
NEUT%: 55.7 % (ref 38.4–76.8)
NEUTROS ABS: 1.4 10*3/uL — AB (ref 1.5–6.5)
Platelets: 235 10*3/uL (ref 145–400)
RBC: 3.89 10*6/uL (ref 3.70–5.45)
RDW: 15.4 % — AB (ref 11.2–14.5)
WBC: 2.5 10*3/uL — AB (ref 3.9–10.3)
lymph#: 0.9 10*3/uL (ref 0.9–3.3)

## 2015-12-16 NOTE — Telephone Encounter (Signed)
Appointments made and avs printed for patient °

## 2015-12-16 NOTE — Progress Notes (Signed)
Patient Care Team: Mackie Pai, PA-C as PCP - General (Physician Assistant) Thea Silversmith, MD as Consulting Physician (Radiation Oncology) Consuela Mimes, MD as Consulting Physician (Hematology and Oncology) Nicholas Lose, MD as Consulting Physician (Hematology and Oncology)  DIAGNOSIS: Breast cancer of lower-outer quadrant of left female breast [facility])   Staging form: Breast, AJCC 7th Edition     Pathologic: Stage IIIC (T4d, N3, cM0) - Signed by [name], MD on 01/19/2014   SUMMARY OF ONCOLOGIC HISTORY:   Breast cancer of lower-outer quadrant of left female breast ([place])   12/07/2010 - 04/12/2011 Neo-Adjuvant Chemotherapy Neoadjuvant FEC x6 followed by Taxotere x4   12/26/2010 Procedure Genetic testing showed mutation for BRCA2 [id number]   05/12/2011 Surgery Bilateral mastectomy with left axillary dissection 11/21 positive lymph nodes 5.8 cm tumor ER 95% PR 0% Ki-67 61% HER-2 negative ratio 1.39 T3, N3, 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   07/03/2011 - 08/17/2011 Radiation Therapy Radiation therapy to the chest and axilla   09/11/2011 -  Anti-estrogen oral therapy Tamoxifen later switched to Arimidex 07/2012   04/22/2012 Surgery Bilateral salpingo-oophorectomy   06/10/2015 Imaging Bone scan: uptake right lateral frontal bone, left malar region, manubrium, right 6,8 ribs; CT-CAP: New extensive infiltrate left & right hilar/mediastinal, right axillary, bil lower neck LN, 3.7 cm LUL consolidation, RML nodules,Lt Pl eff, RPLN   06/18/2015 Initial Biopsy Right axillary lymph node biopsy: 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 She complains of a new onset of neck pain which has been progressing over the past 1-2 weeks. She is taking pain medications which appeared to help somewhat but the pain keeps coming back and it is on a scale of 1-10 it is at 8-9. 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  REVIEW OF SYSTEMS:   Constitutional: Denies fevers, chills or abnormal weight loss, complains of neck pain Eyes: Denies blurriness of vision Ears, nose, mouth, throat, and face: Denies mucositis or sore throat Respiratory: Denies cough, dyspnea or wheezes Cardiovascular: Denies palpitation, chest discomfort Gastrointestinal:  Denies nausea, heartburn or change in bowel habits Skin: Denies abnormal skin rashes Lymphatics: Denies new lymphadenopathy or easy bruising Neurological:Denies numbness, tingling or new weaknesses Behavioral/Psych: Mood is stable, no new changes  Extremities: No lower extremity edema  All other systems were reviewed with the patient and are negative.  I have reviewed the past medical history, past surgical history, social history and family history with the patient and they are unchanged from previous note.  ALLERGIES:  is allergic to doxycycline hydrochloride.  MEDICATIONS:  Current Outpatient Prescriptions  Medication Sig Dispense Refill  .  buPROPion (WELLBUTRIN XL) 150 MG 24 hr tablet   0  . Calcium 200 MG TABS Take 200 mg by mouth daily.     . cholecalciferol (VITAMIN D)  1000 UNITS tablet Take 1,000 Units by mouth daily.     . citalopram (CELEXA) 40 MG tablet TAKE 1 TABLET BY MOUTH DAILY. 30 tablet 5  . dexamethasone (DECADRON) 4 MG tablet Take 0.5 tablets (2 mg total) by mouth daily. 30 tablet 1  . fluocinonide cream (LIDEX) 0.05 % Apply 1 application topically 2 (two) times daily as needed (for rash.).    Marland Kitchen gabapentin (NEURONTIN) 300 MG capsule Take 1 capsule (300 mg total) by mouth daily. 30 capsule 5  . ibuprofen (ADVIL,MOTRIN) 200 MG tablet Take 600 mg by mouth every 8 (eight) hours as needed for moderate pain (pain).     Marland Kitchen letrozole (FEMARA) 2.5 MG tablet Take 1 tablet (2.5 mg total) by mouth daily. 90 tablet 0  . lidocaine-prilocaine (EMLA) cream Apply to affected area once (Patient taking differently: Apply 1 application topically daily as needed. For port) 30 g 3  . LORazepam (ATIVAN) 1 MG tablet   0  . naproxen sodium (ANAPROX) 220 MG tablet Take 220-440 mg by mouth 2 (two) times daily as needed (for muscle aches.).     Marland Kitchen omeprazole (PRILOSEC) 20 MG capsule Take 1 capsule (20 mg total) by mouth daily. Take 2 x  30-60 min before first meal of the day 30 capsule 3  . ondansetron (ZOFRAN) 8 MG tablet TAKE 1 TABLET BY MOUTH 2 TIMES DAILY. START THE DAY AFTER CHEMO FOR 2 DAYS. THEN TAKE AS NEEDED FOR NAUSEA OR VOMITING. 30 tablet 1  . oxyCODONE-acetaminophen (PERCOCET/ROXICET) 5-325 MG tablet Take 1 tablet by mouth 2 (two) times daily as needed for severe pain. 45 tablet 0  . palbociclib (IBRANCE) 125 MG capsule Take 1 capsule (125 mg total) by mouth daily with breakfast. Take whole with food for 21 days, then take 7 days off 21 capsule 2  . ranitidine (ZANTAC) 150 MG tablet 2 at bedtime    . UNABLE TO FIND Dispense compression sleeve 20-30 mm for this patient with history of breast cancer s/p surgery 1 each 0  . zolpidem (AMBIEN) 10 MG tablet TAKE 1 TABLET BY MOUTH AT BEDTIME 30 tablet 0   No current facility-administered medications for this visit.     PHYSICAL EXAMINATION: ECOG PERFORMANCE STATUS: 1 - Symptomatic but completely ambulatory  There were no vitals filed for this visit. There were no vitals filed for this visit.  GENERAL:alert, no distress and comfortable SKIN: skin color, texture, turgor are normal, no rashes or significant lesions EYES: normal, Conjunctiva are pink and non-injected, sclera clear OROPHARYNX:no exudate, no erythema and lips, buccal mucosa, and tongue normal  NECK: neck pain without tenderness LYMPH:  no palpable lymphadenopathy in the cervical, axillary or inguinal LUNGS: clear to auscultation and percussion with normal breathing effort HEART: regular rate & rhythm and no murmurs and no lower extremity edema ABDOMEN:abdomen soft, non-tender and normal bowel sounds MUSCULOSKELETAL:no cyanosis of digits and no clubbing  NEURO: alert & oriented x 3 with fluent speech, no focal motor/sensory deficits EXTREMITIES: No lower extremity edema   LABORATORY DATA:  I have reviewed the data as listed   Chemistry      Component Value Date/Time   NA 141 12/16/2015 1116   NA 141 07/31/2015 1929   K 4.1 12/16/2015 1116   K 3.1* 07/31/2015 1929   CL 106  07/31/2015 1929   CL 108* 07/30/2012 1430   CO2 21* 12/16/2015 1116   CO2 24 07/31/2015 1929   BUN 12.7 12/16/2015 1116   BUN 8 07/31/2015 1929   CREATININE 0.8 12/16/2015 1116   CREATININE 0.73 07/31/2015 1929      Component Value Date/Time   CALCIUM 8.9 12/16/2015 1116   CALCIUM 9.4 07/31/2015 1929   ALKPHOS 70 12/16/2015 1116   ALKPHOS 90 07/31/2015 1929   AST 11 12/16/2015 1116   AST 31 07/31/2015 1929   ALT 12 12/16/2015 1116   ALT 36 07/31/2015 1929   BILITOT 0.33 12/16/2015 1116   BILITOT 0.8 07/31/2015 1929       Lab Results  Component Value Date   WBC 2.5* 12/16/2015   HGB 12.6 12/16/2015   HCT 36.8 12/16/2015   MCV 94.6 12/16/2015   PLT 235 12/16/2015   NEUTROABS 1.4* 12/16/2015     ASSESSMENT & PLAN:  Breast cancer of  lower-outer quadrant of left female breast ([facility]) Breast cancer of lower-outer quadrant of left female breast ([facility]) Right axillary lymph node biopsy 06/18/2015: 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 BRCA 2 mutation underwent salpingo-oophorectomy Status post bilateral mastectomies and radiation to left chest wall and axilla currently on tamoxifen since October 2012) Prior treatment: Halaven day 1 and day 8 every 3 weeks 6 cycles started 06/29/2015 and completed 10/19/2015 PET/CT scan 11/01/2015:Focal patchy LUL opacities new/increased possibly reflecting infection or tumor, mild improving hypermet LN in Rt paratracheal and left infrahilar region, subcut mets to right shoulder and right post gluteal, ext bone mets ------------------------------------------------------------------------------------------------------------------------------------------------------- Current treatment: letrozole with Ibrance along with Zometa started 11/12/2015  Ibrance toxicities: 1. Peripheral Neuropathy 2. Neutropenia: grade 1 3. Hot flashes related to letrozole   Shortness of breath: Probably related to malignancy. Her symptoms sent to get worse towards the end of the day. She had seen pulmonology previously. She does not want to go back to seeing them again at this time.  Her blood count was reviewed and ANC is 1000. So we will keep the dosage the same.  Bone metastases: Zometa Q monthly, calcium plus vitamin D, pain from the bone metastases improved  with Percocets. I renewed her prescription today. I would like to move her Zometa to coincide with her clinic follow-ups. I recommend that we move her Zometa to 12/30/2015 and from then on once a month.  Neck pain: New onset. Not improving with pain medications. Patient has extensive bone metastatic disease. I will refer her to radiation oncology to evaluate for palliative radiation therapy. Patient will have to hold off on taking Ibrance when she starts with radiation.  Return to clinic in 2 weeks to Recheck blood work and then we can see the patient once a month   Orders Placed This Encounter  Procedures  . Ambulatory referral to Radiation Oncology    Referral Priority:  Routine    Referral Type:  Consultation    Referral Reason:  Specialty Services Required    Requested Specialty:  Radiation Oncology    Number of  Visits Requested:  1   The patient has a good understanding of the overall plan. she agrees with it. she will call with any problems that may develop before the next visit here.   Rulon Eisenmenger, MD 12/16/2015

## 2015-12-19 ENCOUNTER — Other Ambulatory Visit: Payer: Self-pay

## 2015-12-19 ENCOUNTER — Emergency Department (HOSPITAL_COMMUNITY): Payer: Medicare Other

## 2015-12-19 ENCOUNTER — Emergency Department (HOSPITAL_COMMUNITY)
Admission: EM | Admit: 2015-12-19 | Discharge: 2015-12-19 | Disposition: A | Discharge status: Home / Self Care | Payer: Medicare Other | Attending: Emergency Medicine | Admitting: Emergency Medicine

## 2015-12-19 ENCOUNTER — Encounter (HOSPITAL_COMMUNITY): Payer: Self-pay | Admitting: *Deleted

## 2015-12-19 DIAGNOSIS — E669 Obesity, unspecified: Secondary | ICD-10-CM | POA: Diagnosis not present

## 2015-12-19 DIAGNOSIS — Z853 Personal history of malignant neoplasm of breast: Secondary | ICD-10-CM | POA: Diagnosis not present

## 2015-12-19 DIAGNOSIS — Z8679 Personal history of other diseases of the circulatory system: Secondary | ICD-10-CM | POA: Insufficient documentation

## 2015-12-19 DIAGNOSIS — F329 Major depressive disorder, single episode, unspecified: Secondary | ICD-10-CM | POA: Diagnosis not present

## 2015-12-19 DIAGNOSIS — R079 Chest pain, unspecified: Secondary | ICD-10-CM | POA: Diagnosis not present

## 2015-12-19 DIAGNOSIS — Z79899 Other long term (current) drug therapy: Secondary | ICD-10-CM | POA: Insufficient documentation

## 2015-12-19 DIAGNOSIS — K219 Gastro-esophageal reflux disease without esophagitis: Secondary | ICD-10-CM | POA: Diagnosis not present

## 2015-12-19 DIAGNOSIS — J984 Other disorders of lung: Secondary | ICD-10-CM | POA: Diagnosis not present

## 2015-12-19 DIAGNOSIS — Z8669 Personal history of other diseases of the nervous system and sense organs: Secondary | ICD-10-CM | POA: Insufficient documentation

## 2015-12-19 DIAGNOSIS — F419 Anxiety disorder, unspecified: Secondary | ICD-10-CM | POA: Insufficient documentation

## 2015-12-19 DIAGNOSIS — R0602 Shortness of breath: Secondary | ICD-10-CM | POA: Diagnosis not present

## 2015-12-19 DIAGNOSIS — Z872 Personal history of diseases of the skin and subcutaneous tissue: Secondary | ICD-10-CM | POA: Insufficient documentation

## 2015-12-19 LAB — URINALYSIS, ROUTINE W REFLEX MICROSCOPIC
Bilirubin Urine: NEGATIVE
GLUCOSE, UA: NEGATIVE mg/dL
Hgb urine dipstick: NEGATIVE
KETONES UR: NEGATIVE mg/dL
LEUKOCYTES UA: NEGATIVE
NITRITE: NEGATIVE
PH: 7 (ref 5.0–8.0)
PROTEIN: NEGATIVE mg/dL
Specific Gravity, Urine: 1.014 (ref 1.005–1.030)

## 2015-12-19 LAB — I-STAT CHEM 8, ED
BUN: 10 mg/dL (ref 6–20)
CALCIUM ION: 1.04 mmol/L — AB (ref 1.12–1.23)
Chloride: 106 mmol/L (ref 101–111)
Creatinine, Ser: 0.6 mg/dL (ref 0.44–1.00)
Glucose, Bld: 83 mg/dL (ref 65–99)
HCT: 34 % — ABNORMAL LOW (ref 36.0–46.0)
HEMOGLOBIN: 11.6 g/dL — AB (ref 12.0–15.0)
Potassium: 3.8 mmol/L (ref 3.5–5.1)
SODIUM: 141 mmol/L (ref 135–145)
TCO2: 22 mmol/L (ref 0–100)

## 2015-12-19 LAB — CBC WITH DIFFERENTIAL/PLATELET
BASOS ABS: 0 10*3/uL (ref 0.0–0.1)
BASOS PCT: 1 %
EOS ABS: 0 10*3/uL (ref 0.0–0.7)
Eosinophils Relative: 2 %
HCT: 33.4 % — ABNORMAL LOW (ref 36.0–46.0)
HEMOGLOBIN: 11.8 g/dL — AB (ref 12.0–15.0)
Lymphocytes Relative: 42 %
Lymphs Abs: 1.1 10*3/uL (ref 0.7–4.0)
MCH: 32.5 pg (ref 26.0–34.0)
MCHC: 35.3 g/dL (ref 30.0–36.0)
MCV: 92 fL (ref 78.0–100.0)
MONOS PCT: 4 %
Monocytes Absolute: 0.1 10*3/uL (ref 0.1–1.0)
NEUTROS PCT: 51 %
Neutro Abs: 1.3 10*3/uL — ABNORMAL LOW (ref 1.7–7.7)
Platelets: 186 10*3/uL (ref 150–400)
RBC: 3.63 MIL/uL — ABNORMAL LOW (ref 3.87–5.11)
RDW: 13.8 % (ref 11.5–15.5)
WBC: 2.5 10*3/uL — AB (ref 4.0–10.5)

## 2015-12-19 LAB — BASIC METABOLIC PANEL
ANION GAP: 5 (ref 5–15)
BUN: 11 mg/dL (ref 6–20)
CO2: 27 mmol/L (ref 22–32)
Calcium: 8.5 mg/dL — ABNORMAL LOW (ref 8.9–10.3)
Chloride: 109 mmol/L (ref 101–111)
Creatinine, Ser: 0.6 mg/dL (ref 0.44–1.00)
GFR calc non Af Amer: >60 mL/min (ref >60)
GLUCOSE: 90 mg/dL (ref 65–99)
POTASSIUM: 3.8 mmol/L (ref 3.5–5.1)
Sodium: 141 mmol/L (ref 135–145)

## 2015-12-19 MED ORDER — HEPARIN SOD (PORK) LOCK FLUSH 100 UNIT/ML IV SOLN
500.0000 [IU] | Freq: Once | INTRAVENOUS | Status: AC
  Administered 2015-12-19: 500 [IU]
  Filled 2015-12-19: qty 5

## 2015-12-19 MED ORDER — SODIUM CHLORIDE 0.9 % IV SOLN
INTRAVENOUS | Status: DC
  Administered 2015-12-19: 16:00:00 via INTRAVENOUS

## 2015-12-19 MED ORDER — OXYCODONE HCL 15 MG PO TABS: 15.0000 mg | ORAL_TABLET | ORAL | Status: DC | PRN

## 2015-12-19 MED ORDER — ONDANSETRON HCL 4 MG/2ML IJ SOLN
4.0000 mg | Freq: Once | INTRAMUSCULAR | Status: AC
  Administered 2015-12-19: 4 mg via INTRAVENOUS
  Filled 2015-12-19: qty 2

## 2015-12-19 MED ORDER — HYDROMORPHONE HCL 1 MG/ML IJ SOLN
0.5000 mg | Freq: Once | INTRAMUSCULAR | Status: AC
  Administered 2015-12-19: 0.5 mg via INTRAVENOUS
  Filled 2015-12-19: qty 1

## 2015-12-19 MED ORDER — ALBUTEROL SULFATE (2.5 MG/3ML) 0.083% IN NEBU: 5.0000 mg | INHALATION_SOLUTION | Freq: Once | RESPIRATORY_TRACT | Status: DC

## 2015-12-19 MED ORDER — IOHEXOL 350 MG/ML SOLN
100.0000 mL | Freq: Once | INTRAVENOUS | Status: AC | PRN
Start: 2015-12-19 — End: 2015-12-19
  Administered 2015-12-19: 100 mL via INTRAVENOUS

## 2015-12-19 MED ORDER — IPRATROPIUM-ALBUTEROL 0.5-2.5 (3) MG/3ML IN SOLN
3.0000 mL | Freq: Once | RESPIRATORY_TRACT | Status: AC
  Administered 2015-12-19: 3 mL via RESPIRATORY_TRACT
  Filled 2015-12-19: qty 3

## 2015-12-19 NOTE — ED Notes (Signed)
Pt reports R cp when she woke up today.  Reports resp distress is not new, states it's d/t a tumor in her lungs but reports that it's getting worse.  Pt is unable to complete a sentence.  Pt is A&Ox 4.  No other complaints at this time.  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

## 2015-12-19 NOTE — Discharge Instructions (Signed)
Take oxycodone for breakthrough pain, do not drink alcohol, drive, care for children or do other critical tasks while taking oxycodone.  See her oncologist early in the week.  Do not hesitate to return to the emergency department for any new, worsening or concerning symptoms.   Shortness of Breath Shortness of breath means you have trouble breathing. It could also mean that you have a medical problem. You should get immediate medical care for shortness of breath. CAUSES   Not enough oxygen in the air such as with high altitudes or a smoke-filled room.  Certain lung diseases, infections, or problems.  Heart disease or conditions, such as angina or heart failure.  Low red blood cells (anemia).  Poor physical fitness, which can cause shortness of breath when you exercise.  Chest or back injuries or stiffness.  Being overweight.  Smoking.  Anxiety, which can make you feel like you are not getting enough air. DIAGNOSIS  Serious medical problems can often be found during your physical exam. Tests may also be done to determine why you are having shortness of breath. Tests may include:  Chest X-rays.  Lung function tests.  Blood tests.  An electrocardiogram (ECG).  An ambulatory electrocardiogram. An ambulatory ECG records your heartbeat patterns over a 24-hour period.  Exercise testing.  A transthoracic echocardiogram (TTE). During echocardiography, sound waves are used to evaluate how blood flows through your heart.  A transesophageal echocardiogram (TEE).  Imaging scans. Your health care provider may not be able to find a cause for your shortness of breath after your exam. In this case, it is important to have a follow-up exam with your health care provider as directed.  TREATMENT  Treatment for shortness of breath depends on the cause of your symptoms and can vary greatly. HOME CARE INSTRUCTIONS   Do not smoke. Smoking is a common cause of shortness of breath. If you  smoke, ask for help to quit.  Avoid being around chemicals or things that may bother your breathing, such as paint fumes and dust.  Rest as needed. Slowly resume your usual activities.  If medicines were prescribed, take them as directed for the full length of time directed. This includes oxygen and any inhaled medicines.  Keep all follow-up appointments as directed by your health care provider. SEEK MEDICAL CARE IF:   Your condition does not improve in the time expected.  You have a hard time doing your normal activities even with rest.  You have any new symptoms. SEEK IMMEDIATE MEDICAL CARE IF:   Your shortness of breath gets worse.  You feel light-headed, faint, or develop a cough not controlled with medicines.  You start coughing up blood.  You have pain with breathing.  You have chest pain or pain in your arms, shoulders, or abdomen.  You have a fever.  You are unable to walk up stairs or exercise the way you normally do. MAKE SURE YOU:  Understand these instructions.  Will watch your condition.  Will get help right away if you are not doing well or get worse.   This information is not intended to replace advice given to you by your health care provider. Make sure you discuss any questions you have with your health care provider.   Document Released: 07/25/2001 Document Revised: 11/04/2013 Document Reviewed: 01/15/2012 Elsevier Interactive Patient Education [employer].

## 2015-12-19 NOTE — ED Provider Notes (Signed)
CSN: [id number]     Arrival date & time 12/19/15  1302 History   First MD Initiated Contact with Patient 12/19/15 1525     Chief Complaint  Patient presents with  . Respiratory Distress  . Chest Pain     (Consider location/radiation/quality/duration/timing/severity/associated sxs/prior Treatment) HPI  Blood pressure 109/80, pulse 89, temperature 97.8 F (36.6 C), temperature source Oral, resp. rate 18, SpO2 100 %.  Tina Vaughan is a 55 y.o. female with stage IV breast cancer complaining of right-sided chest pain onset this morning exacerbated by position rated 3 out of 10. Patient normally takes oxycodone 5 mg 1-2 pills for pain control to her spinal tumors, she hasn't had any of that today normally bothers her later in the day. Patient states she's never had pain like this before. She denies cough above her baseline, fever, chills, nausea, vomiting, CP, palpitations, increasing peripheral edema, calf pain, leg swelling, history of DVT or PE. She also notes that she's been significantly more short of breath over the last 2 days. Patient speaking in short sentences, states this is typical for her. She is taking oral chemotherapy and does not take any neulasta or Neupogen.  OncLindi Adie  Past Medical History  Diagnosis Date  . Frequent urination   . Depression   . Sleep apnea   . Eczema   . Neuromuscular disorder ([place])   . GERD (gastroesophageal reflux disease)   . 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 Lymphedema   . Anxiety   . Thyroid disease     Possible would like to be evaluated  . Headache   . Obesity   . Breast cancer, stage 3 ([place]) 2012    left; inflammatory   Past Surgical History  Procedure Laterality Date  . Mastectomy Bilateral 05/12/11    total mastectomy on right, radical mastectomy on left  . Portacath placement    . Laparoscopy  03/14/2012    Procedure: LAPAROSCOPY OPERATIVE;  Surgeon: Osborne Oman, MD;  Location: [facility];  Service:  Gynecology;  Laterality: N/A;  . Salpingoophorectomy  03/14/2012    Procedure: SALPINGO OOPHERECTOMY;  Surgeon: Osborne Oman, MD;  Location: [facility];  Service: Gynecology;  Laterality: Bilateral;  . Port-a-cath removal  04/19/2012    Procedure: MINOR REMOVAL PORT-A-CATH;  Surgeon: Haywood Lasso, MD;  Location: [facility];  Service: General;  Laterality: N/A;  . Oophorectomy    . Portacath      Placement    Family History  Problem Relation Age of Onset  . Breast cancer Mother   . Stroke Father     heavy smoker  . Hypertension Father   . Pancreatic cancer Mother     pancreatic cancer x 2  . Diabetes Mother     after pancreas removed  . Heart disease Father   . Colon cancer Neg Hx   . Esophageal cancer Neg Hx   . Colitis Neg Hx   . Crohn's disease Neg Hx   . Healthy Sister   . Migraines Sister   . Emphysema Paternal Grandfather     smoked a pipe   Social History  Substance Use Topics  . Smoking status: Never Smoker   . Smokeless tobacco: Never Used  . Alcohol Use: 0.0 oz/week    0 Standard drinks or equivalent per week     Comment: rare   OB History    Gravida Para Term Preterm AB TAB SAB  Ectopic Multiple Living   1    1 1          Review of Systems  10 systems reviewed and found to be negative, except as noted in the HPI.   Allergies  Doxycycline hydrochloride  Home Medications   Prior to Admission medications   Medication Sig Start Date End Date Taking? Authorizing Provider  Calcium 200 MG TABS Take 200 mg by mouth daily.    Yes Historical Provider, MD  cholecalciferol (VITAMIN D) 1000 UNITS tablet Take 1,000 Units by mouth daily.    Yes Historical Provider, MD  citalopram (CELEXA) 40 MG tablet TAKE 1 TABLET BY MOUTH DAILY. 09/30/14  Yes Nicholas Lose, MD  dexamethasone (DECADRON) 4 MG tablet Take 0.5 tablets (2 mg total) by mouth daily. 07/13/15  Yes Nicholas Lose, MD  fluocinonide cream (LIDEX) [id number] % Apply 1 application topically 2 (two)  times daily as needed (for rash.).   Yes Historical Provider, MD  gabapentin (NEURONTIN) 300 MG capsule Take 1 capsule (300 mg total) by mouth daily. 02/10/14  Yes Consuela Mimes, MD  ibuprofen (ADVIL,MOTRIN) 200 MG tablet Take 600 mg by mouth every 8 (eight) hours as needed for moderate pain (pain).    Yes Historical Provider, MD  letrozole (FEMARA) 2.5 MG tablet Take 1 tablet (2.5 mg total) by mouth daily. 11/02/15  Yes Nicholas Lose, MD  lidocaine-prilocaine (EMLA) cream Apply to affected area once Patient taking differently: Apply 1 application topically daily as needed. For port 06/24/15  Yes Nicholas Lose, MD  LORazepam (ATIVAN) 1 MG tablet Take 1 mg by mouth every 6 (six) hours as needed for anxiety.  07/20/15  Yes Historical Provider, MD  naproxen sodium (ANAPROX) 220 MG tablet Take 220-440 mg by mouth 2 (two) times daily as needed (for muscle aches.).    Yes Historical Provider, MD  omeprazole (PRILOSEC) 20 MG capsule Take 1 capsule (20 mg total) by mouth daily. Take 2 x  30-60 min before first meal of the day 09/07/15  Yes Nicholas Lose, MD  ondansetron (ZOFRAN) 8 MG tablet TAKE 1 TABLET BY MOUTH 2 TIMES DAILY. START THE DAY AFTER CHEMO FOR 2 DAYS. THEN TAKE AS NEEDED FOR NAUSEA OR VOMITING. 08/26/15  Yes Nicholas Lose, MD  oxyCODONE-acetaminophen (PERCOCET/ROXICET) 5-325 MG tablet Take 1 tablet by mouth 2 (two) times daily as needed for severe pain. 12/02/15  Yes Nicholas Lose, MD  palbociclib Leslee Home) 125 MG capsule Take 1 capsule (125 mg total) by mouth daily with breakfast. Take whole with food for 21 days, then take 7 days off 12/10/15  Yes Nicholas Lose, MD  ranitidine (ZANTAC) 150 MG tablet 2 at bedtime Patient taking differently: Take 300 mg by mouth at bedtime. 2 at bedtime 08/27/15  Yes Tanda Rockers, MD  UNABLE TO FIND Dispense compression sleeve 20-30 mm for this patient with history of breast cancer s/p surgery 09/14/14  Yes Nicholas Lose, MD  zolpidem (AMBIEN) 10 MG tablet TAKE 1 TABLET BY  MOUTH AT BEDTIME 12/09/15  Yes Nicholas Lose, MD   BP 109/80 mmHg  Pulse 89  Temp(Src) 97.8 F (36.6 C) (Oral)  Resp 18  SpO2 100% Physical Exam  Constitutional: She is oriented to person, place, and time. She appears well-developed and well-nourished. No distress.  HENT:  Head: Normocephalic and atraumatic.  Mouth/Throat: Oropharynx is clear and moist.  Eyes: Conjunctivae and EOM are normal. Pupils are equal, round, and reactive to light.  Neck: Normal range of motion. No JVD present. No tracheal  deviation present.  Cardiovascular: Normal rate, regular rhythm and intact distal pulses.   Radial pulse equal bilaterally  Pulmonary/Chest: Effort normal and breath sounds normal. No stridor. No respiratory distress. She has no wheezes. She has no rales. She exhibits no tenderness.  Reclining comfortably, speaking in short sentences. Lung sounds clear to auscultation with excellent air movement in all fields.  Port with no signs of infection right upper chest, she is tender to palpation just inferior to the port  Abdominal: Soft. Bowel sounds are normal. She exhibits no distension and no mass. There is no tenderness. There is no rebound and no guarding.  Musculoskeletal: Normal range of motion. She exhibits no edema or tenderness.  No calf asymmetry, superficial collaterals, palpable cords, edema, Homans sign negative bilaterally.    Neurological: She is alert and oriented to person, place, and time.  Skin: Skin is warm. She is not diaphoretic.  Psychiatric: She has a normal mood and affect.  Nursing note and vitals reviewed.   ED Course  Procedures (including critical care time) Labs Review Labs Reviewed - No data to display  Imaging Review Dg Chest 2 View  12/19/2015  CLINICAL DATA:  Pt reports central and right side cp when she woke up today. Reports resp distress is not new, states it's d/t a tumor in her lungs but reports that it's getting worse. Pt is unable to complete a sentence.  History of stage III breast cancer. Bilateral mastectomy 2012. EXAM: CHEST  2 VIEW COMPARISON:  11/01/2015 FINDINGS: Right-sided Port-A-Cath tip overlying the level of the lower superior vena cava -upper right atrium. The heart is normal in size. Patchy opacity is again identified in the left lung apex, stable in appearance. There is mild perihilar bronchitic change. No discrete consolidation or pleural effusions. No pulmonary edema. Surgical clips are identified in the left axillary region. IMPRESSION: 1. Persistent densities in the left lung apex, likely stable compared with priors. 2. No acute infectious infiltrates identified. Electronically Signed   By: Nolon Nations M.D.   On: 12/19/2015 14:09   I have personally reviewed and evaluated these images and lab results as part of my medical decision-making.   EKG Interpretation None      MDM   Final diagnoses:  Right-sided chest pain  SOB (shortness of breath)   Filed Vitals:   12/19/15 1324 12/19/15 1509  BP: 118/93 109/80  Pulse: 95 89  Temp: 97.8 F (36.6 C)   TempSrc: Oral   Resp: 18 18  SpO2: 99% 100%    Medications  albuterol (PROVENTIL) (2.5 MG/3ML) 0.083% nebulizer solution 5 mg (not administered)  HYDROmorphone (DILAUDID) injection 0.5 mg (not administered)  ondansetron (ZOFRAN) injection 4 mg (not administered)  ipratropium-albuterol (DUONEB) 0.5-2.5 (3) MG/3ML nebulizer solution 3 mL (not administered)  0.9 %  sodium chloride infusion (not administered)    Tina Vaughan is 55 y.o. female presenting with new onset right-sided chest pain and worsening shortness of breath over the last several days, patient saturating well on room air, lung sounds are clear to auscultation, no signs of volume overload. Chest x-ray with no acute abnormalities, this patient has stage IV breast cancer. No symptoms consistent with DVT however, will assess PE via CAT scan. Patient is given albuterol and half milligram Dilaudid for  comfort.  Blood work with mild pancytopenia consistent with baseline. CT with no PE, there is a slight interval worsening of nodular consolidative opacity in the left upper lobe raising concern for pulmonary metastatic disease however,  this is not where she's having her pain.   States that the pain is alleviated with Dilaudid, shortness of breath is not improved with nebulizer.  This is a shared visit with the attending physician who personally evaluated the patient and agrees with the care plan.   Oncology consult from Dr. Benay Spice appreciated: States patient can call to follow-up with oncology this week.  Evaluation does not show pathology that would require ongoing emergent intervention or inpatient treatment. Pt is hemodynamically stable and mentating appropriately. Discussed findings and plan with patient/guardian, who agrees with care plan. All questions answered. Return precautions discussed and outpatient follow up given.   Discharge Medication List as of 12/19/2015  6:27 PM    START taking these medications   Details  oxyCODONE (ROXICODONE) 15 MG immediate release tablet Take 1 tablet (15 mg total) by mouth every 4 (four) hours as needed for pain., Starting 12/19/2015, Until Discontinued, Print             Monico Blitz, PA-C 12/19/15 2013  Blanchie Dessert, MD 12/20/15 [phone]

## 2015-12-20 ENCOUNTER — Telehealth: Payer: Self-pay | Admitting: *Deleted

## 2015-12-20 ENCOUNTER — Encounter: Payer: Self-pay | Admitting: *Deleted

## 2015-12-20 ENCOUNTER — Other Ambulatory Visit: Payer: Self-pay | Admitting: *Deleted

## 2015-12-20 NOTE — Progress Notes (Signed)
Received telephone advice record from [facility], sent to scan. Patient was seen in ED and has scheduled f/u on 2/8.

## 2015-12-20 NOTE — Progress Notes (Signed)
Location of Breast Cancer: Breast cancer of lower-outer quadrant of left female breast   Histology per Pathology Report: 06-18-15  Receptor Status: ER(90%), PR (0%), Her2-neu (neg), Ki-67 (60%)  Mrs. Trevor presented with complains of a new onset of neck pain which has been progressing over the past 1-2 weeks.   Past/Anticipated interventions by surgeon, if JHE:[id number] [phone]  Past/Anticipated interventions by medical oncology, if any: Chemotherapy 06/29/2015   Ibrance  Anti-estrogen oral therapy  11-08-15 Femara  Bone metastases: Zometa Q monthly, calcium plus vitamin D, pain from the bone metastases improved with Percocets 11/01/2015  PET scan  06-18-15 Biopsy Lymphedema issues, if any: Left arm.wears a sleeve sometimes  Pain issues, if any: Severe neck,back and right  shoulder pain Percocet for pain 3/10  SAFETY ISSUES:  Prior radiation? Yes  Pacemaker/ICD? No  Possible current pregnancy?No  Is the patient on methotrexate? No Current Complaints / other details:   Menarche age 2   , G89, P59, Menopause age 4,  ,BC yes 20 years, HRT  Femara BP 120/72 mmHg  Pulse 103  Temp(Src) 98.2 F (36.8 C) (Oral)  Ht _0  (1.575 m)  Wt 209 lb 11.2 oz (95.119 kg)  BMI 38.34 kg/m2  SpO2 98% Georgena Spurling, RN 12/20/2015,3:21 PM

## 2015-12-20 NOTE — Telephone Encounter (Signed)
Sent pof to schedule OV this week

## 2015-12-20 NOTE — Telephone Encounter (Signed)
TC from patient this am stating that had gone to the ED yesterday d/t SOB and chest pain (notes are in EPIC). No acute findings on CXR or CT scan but "Slight interval worsening nodular consolidative opacities within the left upper lobe raising the possibility of pulmonary metastatic disease".   Pt given additional pain meds and nebulizer and then sent home.  Pt was told to see Dr. Lindi Adie this week.

## 2015-12-21 NOTE — Assessment & Plan Note (Deleted)
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 BRCA 2 mutation underwent salpingo-oophorectomy Status post bilateral mastectomies and radiation to left chest wall and axilla currently on tamoxifen since October 2012) Prior treatment: Halaven day 1 and day 8 every 3 weeks 6 cycles started 06/29/2015 and completed 10/19/2015 PET/CT scan 11/01/2015:Focal patchy LUL opacities new/increased possibly reflecting infection or tumor, mild improving hypermet LN in Rt paratracheal and left infrahilar region, subcut mets to right shoulder and right post gluteal, ext bone mets ------------------------------------------------------------------------------------------------------------------------------------------------------- Current treatment: letrozole with Ibrance along with Zometa started 11/12/2015  Ibrance toxicities: 1. Peripheral Neuropathy 2. Neutropenia: grade 1 3. Hot flashes related to letrozole   Shortness of breath: Probably related to malignancy. Her symptoms sent to get worse towards the end of the day. She had seen pulmonology previously. She does not want to go back to seeing them again at this time.  Her blood count was reviewed and ANC is 1000. So we will keep the dosage the same.  Bone metastases: Zometa Q monthly, calcium plus vitamin D, pain from the bone metastases improved with Percocets. I renewed her prescription today. I would like to move her Zometa to coincide with her clinic  follow-ups. I recommend that we move her Zometa to 12/30/2015 and from then on once a month.  Neck pain: New onset. Not improving with pain medications. Patient has extensive bone metastatic disease. I will refer her to radiation oncology to evaluate for palliative radiation therapy. Patient will have to hold off on taking Ibrance when she starts with radiation.  Return to clinic in 2 weeks to [place] blood work and then we can see the patient once a month

## 2015-12-22 ENCOUNTER — Encounter: Payer: Self-pay | Admitting: Radiation Oncology

## 2015-12-22 ENCOUNTER — Ambulatory Visit
Admission: RE | Admit: 2015-12-22 | Discharge: 2015-12-22 | Disposition: A | Discharge status: Home / Self Care | Payer: Medicare Other | Source: Ambulatory Visit | Attending: Radiation Oncology | Admitting: Radiation Oncology

## 2015-12-22 ENCOUNTER — Ambulatory Visit: Payer: Medicare Other | Admitting: Hematology and Oncology

## 2015-12-22 VITALS — BP 120/72 | HR 103 | Temp 98.2°F | Ht 62.0 in | Wt 209.7 lb

## 2015-12-22 DIAGNOSIS — Z510 Encounter for antineoplastic radiation therapy: Secondary | ICD-10-CM | POA: Diagnosis not present

## 2015-12-22 DIAGNOSIS — C7951 Secondary malignant neoplasm of bone: Secondary | ICD-10-CM | POA: Insufficient documentation

## 2015-12-22 DIAGNOSIS — Z170 Estrogen receptor positive status [ER+]: Secondary | ICD-10-CM | POA: Insufficient documentation

## 2015-12-22 DIAGNOSIS — C50512 Malignant neoplasm of lower-outer quadrant of left female breast: Secondary | ICD-10-CM | POA: Insufficient documentation

## 2015-12-23 ENCOUNTER — Encounter: Payer: Self-pay | Admitting: Hematology and Oncology

## 2015-12-23 ENCOUNTER — Ambulatory Visit
Admission: RE | Admit: 2015-12-23 | Discharge: 2015-12-23 | Disposition: A | Discharge status: Home / Self Care | Payer: Medicare Other | Source: Ambulatory Visit | Attending: Radiation Oncology | Admitting: Radiation Oncology

## 2015-12-23 DIAGNOSIS — Z510 Encounter for antineoplastic radiation therapy: Secondary | ICD-10-CM | POA: Diagnosis not present

## 2015-12-23 DIAGNOSIS — C7951 Secondary malignant neoplasm of bone: Secondary | ICD-10-CM | POA: Diagnosis not present

## 2015-12-23 DIAGNOSIS — Z170 Estrogen receptor positive status [ER+]: Secondary | ICD-10-CM | POA: Diagnosis not present

## 2015-12-23 DIAGNOSIS — C50512 Malignant neoplasm of lower-outer quadrant of left female breast: Secondary | ICD-10-CM | POA: Diagnosis not present

## 2015-12-23 NOTE — Progress Notes (Signed)
Name: Tina Vaughan   MRN: [id number]  Date:  12/23/2015  DOB: [birth year]  Status:Outpatient    DIAGNOSIS: Metastatic cancer to spine  CONSENT VERIFIED: yes   SET UP: Patient is setup supine   IMMOBILIZATION:  The following immobilization was used: Alpha cradle  NARRATIVE:  [name] was brought to the CT Energy manager.  Identity was confirmed.  All relevant records and images related to the planned course of therapy were reviewed.  Then, the patient was positioned in a stable reproducible clinical set-up for radiation therapy.  CT images were obtained.  An isocenter was placed. Skin markings were placed.  The CT images were loaded into the planning software where the target and avoidance structures were contoured.  The radiation prescription was entered and confirmed. The patient was discharged in stable condition and tolerated simulation well.    TREATMENT PLANNING NOTE:  Treatment planning then occurred. I have requested : MLC's, isodose plan, basic dose calculation  I have requested 3 dimensional simulation with DVH of cord, kidneys, bowel and GTV  A total of 2 complex treatment devices will be used in the form of unique MLCS

## 2015-12-23 NOTE — Progress Notes (Signed)
I left form for dr. Lindi Adie to sign if he says she is permantly and totally disabled

## 2015-12-23 NOTE — Progress Notes (Signed)
  Radiation Oncology         [phone]) [phone] ________________________________  Initial outpatient Consultation - Date: 12/22/2015   Name: Tina Vaughan MRN: [id number]   DOB: 02-01-1961  REFERRING PHYSICIAN: Nicholas Lose, MD  DIAGNOSIS AND STAGE: Breast cancer of lower-outer quadrant of left female breast [facility])   Staging form: Breast, AJCC 7th Edition     Pathologic: Stage IIIC (T4d, N3, cM0) - Signed by [name], MD on 01/19/2014   HISTORY OF PRESENT ILLNESS::Tina Vaughan is a 55 y.o. female  Who I treated for inflammatory breast cancer in 2012.  She was BRCA 2+. She was found to have metastatic disease in July of last year.  She has been on haloven as well as Ibrance and letrozole.  She complains of shortness of breath although her 02 saturations have been normal. She went to the ER this weekend and underwent a CT of the chest.  This showed ground glass opacities in the left upper lobe had worsened.  A breathing treatment and steroids were given which she says did not help.  She states her breathing resolved on its own. A PET scan in December showed extensive bony metastatic disease. She has been having worsening back and right shoulder pain. She takes 1 percocet per day and spends > 50% of her time lying down or sitting. She would like to be more active. She is struggling with her diagnosis and wanting to know what her time frame is.  She has seen a counselor at hospice and that has been very helpful. Her sister is her main source of support. She has no other neurologic symptoms or weakness. Her voice has been halting along with her shortness of breath.   PREVIOUS RADIATION THERAPY: Yes to the left chest wall and axilla in 2012  Past medical, social and family history were reviewed in the electronic chart. Review of symptoms was reviewed in the electronic chart. Medications were reviewed in the electronic chart.   PHYSICAL EXAM:  Filed Vitals:   12/22/15 1455  BP: 120/72  Pulse: 103  Temp:  98.2 F (36.8 C)  .209 lb 11.2 oz (95.119 kg). Pleasant female. 5/5 upper extremity strength. No palpable subcutaneous nodules. Tearful but oriented.   IMPRESSION: Metastatic breast cancer with worsening shortness of breath and neck pain  PLAN: We discussed treating her cervical spine which is likely the source of her pain. We discussed side effects of treatment including dysphagia and odynophagia. We discussed the complications that can arise with cord compression in the cervical spine and also why treatment of the whole spine is not indicated at this time. She signed informed consent and we will schedule her simulation for tomorrow with a mask.   We processed her diagnosis and fear of dying and the unknown.  I encouraged her to attend our living with cancer support group.   I spent 60 minutes  face to face with the patient and more than 50% of that time was spent in counseling and/or coordination of care.  ADDENDUM: I presented her CT scan at thoracic tumor board. It was felt her left upper lobe abnormalities were more consistent with interstitial lung disease which can be seen in the immunotherapies.  It was suggested to stop her immunotherapy and give her a trial of another medication to see if this resolved.    ------------------------------------------------  Thea Silversmith, MD

## 2015-12-24 ENCOUNTER — Encounter: Payer: Self-pay | Admitting: Hematology and Oncology

## 2015-12-24 NOTE — Progress Notes (Signed)
Patient will pck up copy of disab form 12/30/15 visit. It is a front with ms. Tina Vaughan

## 2015-12-27 ENCOUNTER — Encounter: Payer: Self-pay | Admitting: Pharmacist

## 2015-12-27 NOTE — Progress Notes (Signed)
..  Oral Chemotherapy Follow-Up Form  Original Start date of oral chemotherapy: _12/23/17__   Called patient today to follow up regarding patient's oral chemotherapy medication: _Ibrance__  Pt is doing ok today. She is still having shortness of breath that she went to the ED last week for evaluation. No significant findings. Pt to start radiation therapy this week for bone pain palliation. She is nervous about the side effects of this but feels the quality of life benefit from the radiation is "worth it". She reports appetite and energy levels to be at baseline. We will continue to monitor this once radiation therapy begins.  Pt reports __0__ tablets/doses missed in the last month.    Pt reports the following side effects: _shortness of breath__    Will follow up and call patient again in __3 weeks_____   Thank you,  Montel Clock, PharmD, [facility]

## 2015-12-28 DIAGNOSIS — C7951 Secondary malignant neoplasm of bone: Secondary | ICD-10-CM | POA: Diagnosis not present

## 2015-12-28 DIAGNOSIS — Z170 Estrogen receptor positive status [ER+]: Secondary | ICD-10-CM | POA: Diagnosis not present

## 2015-12-28 DIAGNOSIS — Z510 Encounter for antineoplastic radiation therapy: Secondary | ICD-10-CM | POA: Diagnosis not present

## 2015-12-28 DIAGNOSIS — C50512 Malignant neoplasm of lower-outer quadrant of left female breast: Secondary | ICD-10-CM | POA: Diagnosis not present

## 2015-12-29 NOTE — Assessment & Plan Note (Signed)
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 BRCA 2 mutation underwent salpingo-oophorectomy Status post bilateral mastectomies and radiation to left chest wall and axilla currently on tamoxifen since October 2012) Prior treatment: Halaven day 1 and day 8 every 3 weeks 6 cycles started 06/29/2015 and completed 10/19/2015 PET/CT scan 11/01/2015:Focal patchy LUL opacities new/increased possibly reflecting infection or tumor, mild improving hypermet LN in Rt paratracheal and left infrahilar region, subcut mets to right shoulder and right post gluteal, ext bone mets ------------------------------------------------------------------------------------------------------------------------------------------------------- Current treatment: letrozole with Ibrance along with Zometa started 11/12/2015  Ibrance toxicities: 1. Peripheral Neuropathy 2. Neutropenia: grade 1 3. Hot flashes related to letrozole   Shortness of breath: Probably related to malignancy. Her symptoms sent to get worse towards the end of the day. She had seen pulmonology previously. She does not want to go back to seeing them again at this time.  Her blood count was reviewed and ANC is 1000. So we will keep the dosage the same.  Bone metastases: Zometa Q monthly, calcium plus vitamin D, pain from the bone metastases improved with Percocets. I renewed her prescription today. I would like to move her Zometa to coincide with her clinic  follow-ups. I recommend that we move her Zometa to 12/30/2015 and from then on once a month.  Neck pain: New onset. Not improving with pain medications. Patient has extensive bone metastatic disease. I will refer her to radiation oncology to evaluate for palliative radiation therapy. Patient will have to hold off on taking Ibrance when she starts with radiation.  Return to clinic in 1 month

## 2015-12-30 ENCOUNTER — Encounter: Payer: Self-pay | Admitting: Hematology and Oncology

## 2015-12-30 ENCOUNTER — Other Ambulatory Visit (HOSPITAL_BASED_OUTPATIENT_CLINIC_OR_DEPARTMENT_OTHER): Payer: Medicare Other

## 2015-12-30 ENCOUNTER — Ambulatory Visit (HOSPITAL_BASED_OUTPATIENT_CLINIC_OR_DEPARTMENT_OTHER): Payer: Medicare Other

## 2015-12-30 ENCOUNTER — Ambulatory Visit
Admission: RE | Admit: 2015-12-30 | Discharge: 2015-12-30 | Disposition: A | Discharge status: Home / Self Care | Payer: Medicare Other | Source: Ambulatory Visit | Attending: Radiation Oncology | Admitting: Radiation Oncology

## 2015-12-30 ENCOUNTER — Ambulatory Visit (HOSPITAL_BASED_OUTPATIENT_CLINIC_OR_DEPARTMENT_OTHER): Payer: Medicare Other | Admitting: Hematology and Oncology

## 2015-12-30 ENCOUNTER — Other Ambulatory Visit: Payer: Self-pay | Admitting: Hematology and Oncology

## 2015-12-30 ENCOUNTER — Telehealth: Payer: Self-pay | Admitting: Hematology and Oncology

## 2015-12-30 VITALS — BP 122/81 | HR 120 | Temp 98.1°F | Resp 20 | Wt 208.1 lb

## 2015-12-30 DIAGNOSIS — R0602 Shortness of breath: Secondary | ICD-10-CM

## 2015-12-30 DIAGNOSIS — M542 Cervicalgia: Secondary | ICD-10-CM

## 2015-12-30 DIAGNOSIS — G620 Drug-induced polyneuropathy: Secondary | ICD-10-CM | POA: Diagnosis not present

## 2015-12-30 DIAGNOSIS — C50512 Malignant neoplasm of lower-outer quadrant of left female breast: Secondary | ICD-10-CM

## 2015-12-30 DIAGNOSIS — D701 Agranulocytosis secondary to cancer chemotherapy: Secondary | ICD-10-CM | POA: Diagnosis not present

## 2015-12-30 DIAGNOSIS — C773 Secondary and unspecified malignant neoplasm of axilla and upper limb lymph nodes: Secondary | ICD-10-CM

## 2015-12-30 DIAGNOSIS — C50912 Malignant neoplasm of unspecified site of left female breast: Secondary | ICD-10-CM

## 2015-12-30 DIAGNOSIS — Z170 Estrogen receptor positive status [ER+]: Secondary | ICD-10-CM | POA: Diagnosis not present

## 2015-12-30 DIAGNOSIS — C7951 Secondary malignant neoplasm of bone: Secondary | ICD-10-CM

## 2015-12-30 DIAGNOSIS — N951 Menopausal and female climacteric states: Secondary | ICD-10-CM

## 2015-12-30 DIAGNOSIS — Z510 Encounter for antineoplastic radiation therapy: Secondary | ICD-10-CM | POA: Diagnosis not present

## 2015-12-30 DIAGNOSIS — C50919 Malignant neoplasm of unspecified site of unspecified female breast: Secondary | ICD-10-CM

## 2015-12-30 LAB — CBC WITH DIFFERENTIAL/PLATELET
BASO%: 1.3 % (ref 0.0–2.0)
Basophils Absolute: 0 10*3/uL (ref 0.0–0.1)
EOS%: 0.9 % (ref 0.0–7.0)
Eosinophils Absolute: 0 10*3/uL (ref 0.0–0.5)
HCT: 40 % (ref 34.8–46.6)
HGB: 13.8 g/dL (ref 11.6–15.9)
LYMPH#: 1.1 10*3/uL (ref 0.9–3.3)
LYMPH%: 35.3 % (ref 14.0–49.7)
MCH: 32.6 pg (ref 25.1–34.0)
MCHC: 34.3 g/dL (ref 31.5–36.0)
MCV: 95.1 fL (ref 79.5–101.0)
MONO#: 0.2 10*3/uL (ref 0.1–0.9)
MONO%: 5.6 % (ref 0.0–14.0)
NEUT%: 56.9 % (ref 38.4–76.8)
NEUTROS ABS: 1.8 10*3/uL (ref 1.5–6.5)
PLATELETS: 165 10*3/uL (ref 145–400)
RBC: 4.21 10*6/uL (ref 3.70–5.45)
RDW: 15.8 % — AB (ref 11.2–14.5)
WBC: 3.1 10*3/uL — ABNORMAL LOW (ref 3.9–10.3)

## 2015-12-30 LAB — COMPREHENSIVE METABOLIC PANEL
ALT: 21 U/L (ref 0–55)
ANION GAP: 9 meq/L (ref 3–11)
AST: 15 U/L (ref 5–34)
Albumin: 4.3 g/dL (ref 3.5–5.0)
Alkaline Phosphatase: 64 U/L (ref 40–150)
BILIRUBIN TOTAL: 0.53 mg/dL (ref 0.20–1.20)
BUN: 12.7 mg/dL (ref 7.0–26.0)
CO2: 26 meq/L (ref 22–29)
CREATININE: 0.8 mg/dL (ref 0.6–1.1)
Calcium: 9.4 mg/dL (ref 8.4–10.4)
Chloride: 107 mEq/L (ref 98–109)
EGFR: 86 mL/min/{1.73_m2} — ABNORMAL LOW (ref >90)
GLUCOSE: 111 mg/dL (ref 70–140)
Potassium: 3.9 mEq/L (ref 3.5–5.1)
Sodium: 142 mEq/L (ref 136–145)
TOTAL PROTEIN: 7.5 g/dL (ref 6.4–8.3)

## 2015-12-30 MED ORDER — SODIUM CHLORIDE 0.9 % IV SOLN
INTRAVENOUS | Status: DC
  Administered 2015-12-30: 16:00:00 via INTRAVENOUS

## 2015-12-30 MED ORDER — OXYCODONE HCL 15 MG PO TABS: 15.0000 mg | ORAL_TABLET | ORAL | Status: DC | PRN

## 2015-12-30 MED ORDER — HEPARIN SOD (PORK) LOCK FLUSH 100 UNIT/ML IV SOLN
500.0000 [IU] | Freq: Once | INTRAVENOUS | Status: AC
  Administered 2015-12-30: 500 [IU] via INTRAVENOUS
  Filled 2015-12-30: qty 5

## 2015-12-30 MED ORDER — SODIUM CHLORIDE 0.9% FLUSH
10.0000 mL | INTRAVENOUS | Status: DC | PRN
  Administered 2015-12-30: 10 mL via INTRAVENOUS
  Filled 2015-12-30: qty 10

## 2015-12-30 MED ORDER — ZOLEDRONIC ACID 4 MG/100ML IV SOLN
4.0000 mg | Freq: Once | INTRAVENOUS | Status: AC
  Administered 2015-12-30: 4 mg via INTRAVENOUS
  Filled 2015-12-30: qty 100

## 2015-12-30 NOTE — Patient Instructions (Signed)

## 2015-12-30 NOTE — Addendum Note (Signed)
Addended by: Prentiss Bells on: 12/30/2015 03:58 PM   Modules accepted: Orders

## 2015-12-30 NOTE — Progress Notes (Signed)
Patient Care Team: Mackie Pai, PA-C as PCP - General (Physician Assistant) Thea Silversmith, MD as Consulting Physician (Radiation Oncology) Consuela Mimes, MD as Consulting Physician (Hematology and Oncology) Nicholas Lose, MD as Consulting Physician (Hematology and Oncology)  DIAGNOSIS: Breast cancer of lower-outer quadrant of left female breast [facility])   Staging form: Breast, AJCC 7th Edition     Pathologic: Stage IIIC (T4d, N3, cM0) - Signed by [name], MD on 01/19/2014   SUMMARY OF ONCOLOGIC HISTORY:   Breast cancer of lower-outer quadrant of left female breast (Tina Vaughan)   12/07/2010 - 04/12/2011 Neo-Adjuvant Chemotherapy Neoadjuvant FEC x6 followed by Taxotere x4   12/26/2010 Procedure Genetic testing showed mutation for BRCA2 [id number]   05/12/2011 Surgery Bilateral mastectomy with left axillary dissection 11/21 positive lymph nodes 5.8 cm tumor ER 95% PR 0% Ki-67 61% HER-2 negative ratio 1.39 T3, N3, 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   07/03/2011 - 08/17/2011 Radiation Therapy Radiation therapy to the chest and axilla   09/11/2011 -  Anti-estrogen oral therapy Tamoxifen later switched to Arimidex 07/2012   04/22/2012 Surgery Bilateral salpingo-oophorectomy   06/10/2015 Imaging Bone scan: uptake right lateral frontal bone, left malar region, manubrium, right 6,8 ribs; CT-CAP: New extensive infiltrate left & right hilar/mediastinal, right axillary, bil lower neck LN, 3.7 cm LUL consolidation, RML nodules,Lt Pl eff, RPLN   06/18/2015 Initial Biopsy Right axillary lymph node biopsy: 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, ER 90%, PR 0%, Ki-67 60%, HER-2 negative   06/29/2015 -  Chemotherapy Halaven days 1 and 8 Q 3 weeks   11/01/2015 PET scan Focal patchy LUL opacities new/increased possibly reflecting infection or tumor, mild improving hypermet LN in Rt paratracheal and left infrahilar region, subcut mets to right shoulder and right post gluteal, ext bone mets   11/08/2015 -  Anti-estrogen oral therapy Ibrance with Letrozole   12/30/2015 -  Radiation Therapy Palliative radiation to the neck    CHIEF COMPLIANT: Started palliative radiation to the neck today  INTERVAL HISTORY: [name] is a 56 year old with above-mentioned history metastatic breast cancer who is referred to radiation oncology for neck pain and they started on palliative radiation starting today. We are holding Ibrance until radiation is complete. She has chronic shortness of breath for which she had seen pulmonology in the past. Her breathing is occasionally much worse. It could be psychological in addition to interstitial lung disease.  REVIEW OF SYSTEMS:   Constitutional: Denies fevers, chills or abnormal weight loss Eyes: Denies blurriness of vision Ears, nose, mouth, throat, and face: Denies mucositis or sore throat Respiratory: Shortness of breath with talking, however no shortness of breath to exertion Cardiovascular: Denies palpitation, chest discomfort Gastrointestinal:  Denies nausea, heartburn or change in bowel habits Skin: Denies abnormal skin rashes Lymphatics: Denies new lymphadenopathy or easy bruising Neurological:Denies numbness, tingling or new weaknesses Behavioral/Psych: Mood is stable, no new changes  Extremities: No lower extremity edema Breast:  denies any pain or lumps or nodules in either breasts All other systems were reviewed with the patient and are negative.  I have reviewed the past medical history, past surgical history, social history and family history with the patient and they are unchanged from previous note.  ALLERGIES:  is allergic to doxycycline hydrochloride.  MEDICATIONS:  Current Outpatient Prescriptions  Medication Sig Dispense Refill  . buPROPion (WELLBUTRIN) 75 MG tablet Take 75 mg by mouth 2 (two) times daily.    . Calcium 200 MG TABS Take 200  mg by mouth daily.     . cholecalciferol (VITAMIN D) 1000 UNITS tablet Take 1,000 Units  by mouth daily.     . citalopram (CELEXA) 40 MG tablet TAKE 1 TABLET BY MOUTH DAILY. 30 tablet 5  . fluocinonide cream (LIDEX) 5.97 % Apply 1 application topically 2 (two) times daily as needed (for rash.).    Marland Kitchen gabapentin (NEURONTIN) 300 MG capsule Take 1 capsule (300 mg total) by mouth daily. 30 capsule 5  . ibuprofen (ADVIL,MOTRIN) 200 MG tablet Take 600 mg by mouth every 8 (eight) hours as needed for moderate pain (pain).     Marland Kitchen letrozole (FEMARA) 2.5 MG tablet Take 1 tablet (2.5 mg total) by mouth daily. 90 tablet 0  . lidocaine-prilocaine (EMLA) cream Apply to affected area once (Patient taking differently: Apply 1 application topically daily as needed. For port) 30 g 3  . LORazepam (ATIVAN) 1 MG tablet Take 1 mg by mouth every 6 (six) hours as needed for anxiety.   0  . naproxen sodium (ANAPROX) 220 MG tablet Take 220-440 mg by mouth 2 (two) times daily as needed (for muscle aches.).     Marland Kitchen omeprazole (PRILOSEC) 20 MG capsule Take 1 capsule (20 mg total) by mouth daily. Take 2 x  30-60 min before first meal of the day 30 capsule 3  . ondansetron (ZOFRAN) 8 MG tablet TAKE 1 TABLET BY MOUTH 2 TIMES DAILY. START THE DAY AFTER CHEMO FOR 2 DAYS. THEN TAKE AS NEEDED FOR NAUSEA OR VOMITING. 30 tablet 1  . oxyCODONE (ROXICODONE) 15 MG immediate release tablet Take 1 tablet (15 mg total) by mouth every 4 (four) hours as needed for pain. 60 tablet 0  . palbociclib (IBRANCE) 125 MG capsule Take 1 capsule (125 mg total) by mouth daily with breakfast. Take whole with food for 21 days, then take 7 days off 21 capsule 2  . ranitidine (ZANTAC) 150 MG tablet 2 at bedtime (Patient taking differently: Take 300 mg by mouth at bedtime. 2 at bedtime)    . UNABLE TO FIND Dispense compression sleeve 20-30 mm for this patient with history of breast cancer s/p surgery 1 each 0  . zolpidem (AMBIEN) 10 MG tablet TAKE 1 TABLET BY MOUTH AT BEDTIME 30 tablet 0  . dexamethasone (DECADRON) 4 MG tablet Take 0.5 tablets (2 mg  total) by mouth daily. (Patient not taking: Reported on 12/30/2015) 30 tablet 1   No current facility-administered medications for this visit.    PHYSICAL EXAMINATION: ECOG PERFORMANCE STATUS: 1 - Symptomatic but completely ambulatory  Filed Vitals:   12/30/15 1429  BP: 122/81  Pulse: 120  Temp: 98.1 F (36.7 C)  Resp: 20   Filed Weights   12/30/15 1429  Weight: 208 lb 1.6 oz (94.394 kg)    GENERAL:alert, no distress and comfortable SKIN: skin color, texture, turgor are normal, no rashes or significant lesions EYES: normal, Conjunctiva are pink and non-injected, sclera clear OROPHARYNX:no exudate, no erythema and lips, buccal mucosa, and tongue normal  NECK: supple, thyroid normal size, non-tender, without nodularity LYMPH:  no palpable lymphadenopathy in the cervical, axillary or inguinal LUNGS: clear to auscultation and percussion with normal breathing effort HEART: regular rate & rhythm and no murmurs and no lower extremity edema ABDOMEN:abdomen soft, non-tender and normal bowel sounds MUSCULOSKELETAL:no cyanosis of digits and no clubbing  NEURO: alert & oriented x 3 with fluent speech, no focal motor/sensory deficits EXTREMITIES: No lower extremity edema   LABORATORY DATA:  I have reviewed  the data as listed   Chemistry      Component Value Date/Time   NA 142 12/30/2015 1418   NA 141 12/19/2015 1619   K 3.9 12/30/2015 1418   K 3.8 12/19/2015 1619   CL 106 12/19/2015 1619   CL 108* 07/30/2012 1430   CO2 26 12/30/2015 1418   CO2 27 12/19/2015 1604   BUN 12.7 12/30/2015 1418   BUN 10 12/19/2015 1619   CREATININE 0.8 12/30/2015 1418   CREATININE 0.60 12/19/2015 1619      Component Value Date/Time   CALCIUM 9.4 12/30/2015 1418   CALCIUM 8.5* 12/19/2015 1604   ALKPHOS 64 12/30/2015 1418   ALKPHOS 90 07/31/2015 1929   AST 15 12/30/2015 1418   AST 31 07/31/2015 1929   ALT 21 12/30/2015 1418   ALT 36 07/31/2015 1929   BILITOT 0.53 12/30/2015 1418   BILITOT 0.8  07/31/2015 1929       Lab Results  Component Value Date   WBC 3.1* 12/30/2015   HGB 13.8 12/30/2015   HCT 40.0 12/30/2015   MCV 95.1 12/30/2015   PLT 165 12/30/2015   NEUTROABS 1.8 12/30/2015     ASSESSMENT & PLAN:  Breast cancer of lower-outer quadrant of left female breast ([place]) Right axillary lymph node biopsy 06/18/2015: 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 BRCA 2 mutation underwent salpingo-oophorectomy Status post bilateral mastectomies and radiation to left chest wall and axilla currently on tamoxifen since October 2012) Prior treatment: Halaven day 1 and day 8 every 3 weeks 6 cycles started 06/29/2015 and completed 10/19/2015 PET/CT scan 11/01/2015:Focal patchy LUL opacities new/increased possibly reflecting infection or tumor, mild improving hypermet LN in Rt paratracheal and left infrahilar region, subcut mets to right shoulder and right post gluteal, ext bone mets ------------------------------------------------------------------------------------------------------------------------------------------------------- Current treatment: letrozole with Ibrance along with Zometa started 11/12/2015  Ibrance toxicities: 1. Peripheral Neuropathy 2. Neutropenia: grade 1 3. Hot flashes related to letrozole   Shortness of breath: Probably related to malignancy. Her symptoms sent to get worse towards the end of the day. She had seen pulmonology previously. She does not want to go back to seeing them again at this time.  Her  blood count was reviewed and ANC is 1800. So we will keep the dosage the same.  Bone metastases: Zometa Q monthly, calcium plus vitamin D, pain from the bone metastases improved with Roxicodone. I renewed her prescription today.  Neck pain: Palliative radiation to the neck started 12/30/2015  Return to clinic in 2 weeks at the end of radiation therapy to recheck the blood counts   No orders of the defined types were placed in this encounter.   The patient has a good understanding of the overall plan. she agrees with it. she will call with any problems that may develop before the next visit here.   Rulon Eisenmenger, MD 12/30/2015

## 2015-12-30 NOTE — Telephone Encounter (Signed)
Appointments made and avs printed °

## 2015-12-31 ENCOUNTER — Ambulatory Visit
Admission: RE | Admit: 2015-12-31 | Discharge: 2015-12-31 | Disposition: A | Discharge status: Home / Self Care | Payer: Medicare Other | Source: Ambulatory Visit | Attending: Radiation Oncology | Admitting: Radiation Oncology

## 2015-12-31 DIAGNOSIS — C50512 Malignant neoplasm of lower-outer quadrant of left female breast: Secondary | ICD-10-CM | POA: Diagnosis not present

## 2015-12-31 DIAGNOSIS — Z510 Encounter for antineoplastic radiation therapy: Secondary | ICD-10-CM | POA: Diagnosis not present

## 2015-12-31 DIAGNOSIS — C7951 Secondary malignant neoplasm of bone: Secondary | ICD-10-CM | POA: Diagnosis not present

## 2015-12-31 DIAGNOSIS — Z170 Estrogen receptor positive status [ER+]: Secondary | ICD-10-CM | POA: Diagnosis not present

## 2016-01-03 ENCOUNTER — Other Ambulatory Visit: Payer: Self-pay | Admitting: Hematology and Oncology

## 2016-01-03 ENCOUNTER — Ambulatory Visit
Admission: RE | Admit: 2016-01-03 | Discharge: 2016-01-03 | Disposition: A | Discharge status: Home / Self Care | Payer: Medicare Other | Source: Ambulatory Visit | Attending: Radiation Oncology | Admitting: Radiation Oncology

## 2016-01-03 DIAGNOSIS — Z510 Encounter for antineoplastic radiation therapy: Secondary | ICD-10-CM | POA: Diagnosis not present

## 2016-01-03 DIAGNOSIS — C7951 Secondary malignant neoplasm of bone: Secondary | ICD-10-CM | POA: Diagnosis not present

## 2016-01-03 DIAGNOSIS — C50512 Malignant neoplasm of lower-outer quadrant of left female breast: Secondary | ICD-10-CM | POA: Diagnosis not present

## 2016-01-03 DIAGNOSIS — Z170 Estrogen receptor positive status [ER+]: Secondary | ICD-10-CM | POA: Diagnosis not present

## 2016-01-03 NOTE — Telephone Encounter (Signed)
Chart reviewed.

## 2016-01-04 ENCOUNTER — Ambulatory Visit
Admission: RE | Admit: 2016-01-04 | Discharge: 2016-01-04 | Disposition: A | Discharge status: Home / Self Care | Payer: Medicare Other | Source: Ambulatory Visit | Attending: Radiation Oncology | Admitting: Radiation Oncology

## 2016-01-04 DIAGNOSIS — C50512 Malignant neoplasm of lower-outer quadrant of left female breast: Secondary | ICD-10-CM | POA: Diagnosis not present

## 2016-01-04 DIAGNOSIS — C7951 Secondary malignant neoplasm of bone: Secondary | ICD-10-CM | POA: Diagnosis not present

## 2016-01-04 DIAGNOSIS — Z510 Encounter for antineoplastic radiation therapy: Secondary | ICD-10-CM | POA: Diagnosis not present

## 2016-01-04 DIAGNOSIS — Z170 Estrogen receptor positive status [ER+]: Secondary | ICD-10-CM | POA: Diagnosis not present

## 2016-01-04 NOTE — Progress Notes (Signed)
Weekly Management Note Current Dose: 12  Gy  Projected Dose: 30 Gy   Narrative:  The patient presents for routine under treatment assessment.  CBCT/MVCT images/Port film x-rays were reviewed.  The chart was checked. Doing well. Voice continues to be a challenge. Sore throat last night. Better this AM.    Physical Findings: Weight:  . Unchanged  Impression:  The patient is tolerating radiation.  Plan:  Continue treatment as planned. Monitor swallowing/sore throat. May need script for carafate or MMW soon.

## 2016-01-05 ENCOUNTER — Ambulatory Visit
Admission: RE | Admit: 2016-01-05 | Discharge: 2016-01-05 | Disposition: A | Discharge status: Home / Self Care | Payer: Medicare Other | Source: Ambulatory Visit | Attending: Radiation Oncology | Admitting: Radiation Oncology

## 2016-01-05 DIAGNOSIS — Z510 Encounter for antineoplastic radiation therapy: Secondary | ICD-10-CM | POA: Diagnosis not present

## 2016-01-05 DIAGNOSIS — C50512 Malignant neoplasm of lower-outer quadrant of left female breast: Secondary | ICD-10-CM | POA: Diagnosis not present

## 2016-01-05 DIAGNOSIS — Z170 Estrogen receptor positive status [ER+]: Secondary | ICD-10-CM | POA: Diagnosis not present

## 2016-01-05 DIAGNOSIS — C7951 Secondary malignant neoplasm of bone: Secondary | ICD-10-CM | POA: Diagnosis not present

## 2016-01-06 ENCOUNTER — Ambulatory Visit
Admission: RE | Admit: 2016-01-06 | Discharge: 2016-01-06 | Disposition: A | Discharge status: Home / Self Care | Payer: Medicare Other | Source: Ambulatory Visit | Attending: Radiation Oncology | Admitting: Radiation Oncology

## 2016-01-06 DIAGNOSIS — C50512 Malignant neoplasm of lower-outer quadrant of left female breast: Secondary | ICD-10-CM | POA: Diagnosis not present

## 2016-01-06 DIAGNOSIS — C7951 Secondary malignant neoplasm of bone: Secondary | ICD-10-CM | POA: Diagnosis not present

## 2016-01-06 DIAGNOSIS — Z510 Encounter for antineoplastic radiation therapy: Secondary | ICD-10-CM | POA: Diagnosis not present

## 2016-01-06 DIAGNOSIS — Z170 Estrogen receptor positive status [ER+]: Secondary | ICD-10-CM | POA: Diagnosis not present

## 2016-01-07 ENCOUNTER — Other Ambulatory Visit: Payer: Self-pay | Admitting: Radiation Oncology

## 2016-01-07 ENCOUNTER — Ambulatory Visit
Admission: RE | Admit: 2016-01-07 | Discharge: 2016-01-07 | Disposition: A | Discharge status: Home / Self Care | Payer: Medicare Other | Source: Ambulatory Visit | Attending: Radiation Oncology | Admitting: Radiation Oncology

## 2016-01-07 ENCOUNTER — Encounter: Payer: Self-pay | Admitting: Radiation Oncology

## 2016-01-07 VITALS — BP 115/79 | HR 93 | Temp 98.6°F | Ht 62.0 in | Wt 205.8 lb

## 2016-01-07 DIAGNOSIS — Z510 Encounter for antineoplastic radiation therapy: Secondary | ICD-10-CM | POA: Diagnosis not present

## 2016-01-07 DIAGNOSIS — C50512 Malignant neoplasm of lower-outer quadrant of left female breast: Secondary | ICD-10-CM | POA: Diagnosis not present

## 2016-01-07 DIAGNOSIS — Z170 Estrogen receptor positive status [ER+]: Secondary | ICD-10-CM | POA: Diagnosis not present

## 2016-01-07 DIAGNOSIS — C7951 Secondary malignant neoplasm of bone: Secondary | ICD-10-CM | POA: Diagnosis not present

## 2016-01-07 MED ORDER — MAGIC MOUTHWASH W/LIDOCAINE: 5.0000 mL | Freq: Three times a day (TID) | ORAL | Status: DC | PRN

## 2016-01-07 NOTE — Progress Notes (Signed)
Pt having dysphagia. Requested script for MMW. Script given. Follow up on Tuesday.

## 2016-01-07 NOTE — Progress Notes (Addendum)
Tina Vaughan has completed 7 fractions to her C spine.  She reports having a sore throat with swallowing that started yesterday.  She reports she has started eating softer foods and soups but thinks she would like magic mouthwash.  She is not taking steroids.   BP 115/79 mmHg  Pulse 93  Temp(Src) 98.6 F (37 C) (Oral)   Wt Readings from Last 3 Encounters:  01/07/16 205 lb 12.8 oz (93.35 kg)  12/30/15 208 lb 1.6 oz (94.394 kg)  12/22/15 209 lb 11.2 oz (95.119 kg)

## 2016-01-10 ENCOUNTER — Ambulatory Visit
Admission: RE | Admit: 2016-01-10 | Discharge: 2016-01-10 | Disposition: A | Discharge status: Home / Self Care | Payer: Medicare Other | Source: Ambulatory Visit | Attending: Radiation Oncology | Admitting: Radiation Oncology

## 2016-01-10 ENCOUNTER — Ambulatory Visit: Admission: RE | Admit: 2016-01-10 | Payer: Medicare Other | Source: Ambulatory Visit

## 2016-01-10 VITALS — BP 123/77 | HR 102 | Temp 98.4°F | Wt 203.6 lb

## 2016-01-10 DIAGNOSIS — C7951 Secondary malignant neoplasm of bone: Secondary | ICD-10-CM

## 2016-01-10 MED ORDER — OXYCODONE-ACETAMINOPHEN 10-325 MG PO TABS: ORAL_TABLET | ORAL | Status: DC

## 2016-01-10 MED ORDER — SUCRALFATE 1 G PO TABS: ORAL_TABLET | ORAL | Status: DC

## 2016-01-10 NOTE — Progress Notes (Addendum)
Patient reports pain at a 10/10 due to a sore throat/swallowing.  She reports she is taking magic mouthwash q 2 hours and is even swallowing it occasionally with no relief.  She is also taking oxycodone/acetaminophen 5/325 mg that she had left over - 2 tablets q 4 hours which takes the edge off the pain.  She reports she is only able to eat yogurt and ice cream.  She is tearful today and would like to stop treatment.  BP 123/77 mmHg  Pulse 102  Temp(Src) 98.4 F (36.9 C) (Oral)  Wt 203 lb 9.6 oz (92.352 kg)  SpO2 99%   Wt Readings from Last 3 Encounters:  01/10/16 203 lb 9.6 oz (92.352 kg)  01/07/16 205 lb 12.8 oz (93.35 kg)  12/30/15 208 lb 1.6 oz (94.394 kg)

## 2016-01-10 NOTE — Progress Notes (Signed)
   Weekly Management Note:  Outpatient    ICD-9-CM ICD-10-CM   1. Bone metastasis (HCC) 198.5 C79.51 oxyCODONE-acetaminophen (PERCOCET) 10-325 MG tablet     sucralfate (CARAFATE) 1 g tablet    Current Dose:  21 Gy  Projected Dose: 30 Gy   Narrative:  The patient presents for routine under treatment assessment.  CBCT/MVCT images/Port film x-rays were reviewed.  The chart was checked. She is having severe odynophagia.  Insists she wants to stop RT altogether. MMW not helping. Oxycodone/acet 5-325 (2 tabs q 4hrs) helps some.  Physical Findings:  weight is 203 lb 9.6 oz (92.352 kg). Her oral temperature is 98.4 F (36.9 C). Her blood pressure is 123/77 and her pulse is 102. Her oxygen saturation is 99%.   Wt Readings from Last 3 Encounters:  01/10/16 203 lb 9.6 oz (92.352 kg)  01/07/16 205 lb 12.8 oz (93.35 kg)  12/30/15 208 lb 1.6 oz (94.394 kg)   Tearful, no oral thrush.  Impression:  The patient has significant pain during RT  Plan:  Rx'd  meds as above in Dx.  Take  Percocet 10-325,  Up to 2 tabs q 4 hrs, PRN pain. Try sucralfate.  Miralax PRN constipation.  Come in 30 min early tomorrow to discuss pain control and treatment plans with Dr Pablo Ledger.  Pt believes she'll want to stop but will keep an open mind.  Refuses RT today.  ________________________________   Eppie Gibson, M.D.

## 2016-01-11 ENCOUNTER — Ambulatory Visit
Admission: RE | Admit: 2016-01-11 | Discharge: 2016-01-11 | Disposition: A | Discharge status: Home / Self Care | Payer: Medicare Other | Source: Ambulatory Visit | Attending: Radiation Oncology | Admitting: Radiation Oncology

## 2016-01-11 ENCOUNTER — Ambulatory Visit: Admission: RE | Admit: 2016-01-11 | Payer: Medicare Other | Source: Ambulatory Visit

## 2016-01-11 ENCOUNTER — Encounter: Payer: Self-pay | Admitting: Radiation Oncology

## 2016-01-11 VITALS — BP 108/68 | HR 94 | Temp 97.6°F | Ht 62.0 in | Wt 203.9 lb

## 2016-01-11 DIAGNOSIS — C7951 Secondary malignant neoplasm of bone: Secondary | ICD-10-CM

## 2016-01-11 NOTE — Progress Notes (Signed)
Mrs. Lenox Ahr has received 7 fractions to her neck. Patient reports pain at a 107/10 due to a sore throat/swallowing.  She is also taking oxycodone/acetaminophen 5/325 mg that she had left over - 2 tablets q 4 hours which takes the edge off the pain. She reports she is only able to eat yogurt and ice cream and soup.  Energy level is not great.  Skin to neck has some redness. BP 108/68 mmHg  Pulse 94  Temp(Src) 97.6 F (36.4 C) (Oral)  Ht 5\' 2"  (1.575 m)  Wt 203 lb 14.4 oz (92.488 kg)  BMI 37.28 kg/m2  SpO2 100%

## 2016-01-11 NOTE — Progress Notes (Signed)
  Radiation Oncology         [phone]) [phone] ________________________________  Name: CALANDRA BONUS MRN: [id number]  Date: 01/11/2016  DOB: 1961-03-11  End of Treatment Note  Diagnosis:   Breast cancer of lower-outer quadrant of left female breast [facility])   Staging form: Breast, AJCC 7th Edition     Pathologic: Stage IIIC (T4d, N3, cM0) - Signed by [name], MD on 01/19/2014  Indication for treatment:  Metastatic breast cancer to spine.        Radiation treatment dates:   12/30/15-01/07/16  Site/dose:   Cervical spine C1-C7  Beams/energy:   Opposed obliques with 10 MV photons  Narrative: The patient tolerated radiation treatment relatively well.   She elected to stop treatment early due to odynophagia. She understands that this is not a curative dose.  We discussed the importance of nutrition and hydration.   Plan: The patient has completed radiation treatment. The patient will return to radiation oncology clinic for routine followup in one month. I advised them to call or return sooner if they have any questions or concerns related to their recovery or treatment.  ------------------------------------------------  Thea Silversmith, MD

## 2016-01-12 ENCOUNTER — Ambulatory Visit: Payer: Medicare Other

## 2016-01-12 NOTE — Assessment & Plan Note (Signed)
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 BRCA 2 mutation underwent salpingo-oophorectomy Status post bilateral mastectomies and radiation to left chest wall and axilla currently on tamoxifen since October 2012) Prior treatment: Halaven day 1 and day 8 every 3 weeks 6 cycles started 06/29/2015 and completed 10/19/2015 PET/CT scan 11/01/2015:Focal patchy LUL opacities new/increased possibly reflecting infection or tumor, mild improving hypermet LN in Rt paratracheal and left infrahilar region, subcut mets to right shoulder and right post gluteal, ext bone mets ------------------------------------------------------------------------------------------------------------------------------------------------------- Current treatment: letrozole with Ibrance along with Zometa started 11/12/2015  Ibrance toxicities: 1. Peripheral Neuropathy 2. Neutropenia: grade 1 3. Hot flashes related to letrozole   Shortness of breath: Probably related to malignancy. Her symptoms sent to get worse towards the end of the day. She had seen pulmonology previously. She does not want to go back to seeing them again at this time.  Her blood count was reviewed and ANC is . So we will keep the dosage the same.  Bone metastases: Zometa Q monthly, calcium plus vitamin D, pain from the bone metastases improved with Roxicodone. I renewed her prescription today.  Neck pain: Palliative radiation to the neck started  12/30/2015  Return to clinic in 4 weeks

## 2016-01-13 ENCOUNTER — Ambulatory Visit: Payer: Medicare Other

## 2016-01-13 ENCOUNTER — Telehealth: Payer: Self-pay | Admitting: Hematology and Oncology

## 2016-01-13 ENCOUNTER — Encounter: Payer: Self-pay | Admitting: Hematology and Oncology

## 2016-01-13 ENCOUNTER — Encounter: Payer: Self-pay | Admitting: *Deleted

## 2016-01-13 ENCOUNTER — Other Ambulatory Visit (HOSPITAL_BASED_OUTPATIENT_CLINIC_OR_DEPARTMENT_OTHER): Payer: Medicare Other

## 2016-01-13 ENCOUNTER — Ambulatory Visit (HOSPITAL_BASED_OUTPATIENT_CLINIC_OR_DEPARTMENT_OTHER): Payer: Medicare Other | Admitting: Hematology and Oncology

## 2016-01-13 VITALS — BP 112/75 | HR 108 | Temp 97.8°F | Resp 18 | Ht 62.0 in | Wt 205.0 lb

## 2016-01-13 DIAGNOSIS — K209 Esophagitis, unspecified: Secondary | ICD-10-CM

## 2016-01-13 DIAGNOSIS — C50912 Malignant neoplasm of unspecified site of left female breast: Secondary | ICD-10-CM | POA: Diagnosis not present

## 2016-01-13 DIAGNOSIS — D701 Agranulocytosis secondary to cancer chemotherapy: Secondary | ICD-10-CM | POA: Diagnosis not present

## 2016-01-13 DIAGNOSIS — C50512 Malignant neoplasm of lower-outer quadrant of left female breast: Secondary | ICD-10-CM | POA: Diagnosis not present

## 2016-01-13 DIAGNOSIS — C773 Secondary and unspecified malignant neoplasm of axilla and upper limb lymph nodes: Secondary | ICD-10-CM

## 2016-01-13 DIAGNOSIS — M542 Cervicalgia: Secondary | ICD-10-CM

## 2016-01-13 DIAGNOSIS — C7951 Secondary malignant neoplasm of bone: Secondary | ICD-10-CM

## 2016-01-13 DIAGNOSIS — G620 Drug-induced polyneuropathy: Secondary | ICD-10-CM

## 2016-01-13 DIAGNOSIS — N951 Menopausal and female climacteric states: Secondary | ICD-10-CM

## 2016-01-13 DIAGNOSIS — C50919 Malignant neoplasm of unspecified site of unspecified female breast: Secondary | ICD-10-CM

## 2016-01-13 LAB — COMPREHENSIVE METABOLIC PANEL
ALT: 15 U/L (ref 0–55)
ANION GAP: 7 meq/L (ref 3–11)
AST: 13 U/L (ref 5–34)
Albumin: 3.9 g/dL (ref 3.5–5.0)
Alkaline Phosphatase: 58 U/L (ref 40–150)
BUN: 10.3 mg/dL (ref 7.0–26.0)
CHLORIDE: 107 meq/L (ref 98–109)
CO2: 26 meq/L (ref 22–29)
CREATININE: 0.8 mg/dL (ref 0.6–1.1)
Calcium: 9.1 mg/dL (ref 8.4–10.4)
EGFR: 89 mL/min/{1.73_m2} — AB (ref >90)
Glucose: 83 mg/dl (ref 70–140)
Potassium: 4.2 mEq/L (ref 3.5–5.1)
Sodium: 140 mEq/L (ref 136–145)
Total Bilirubin: 0.42 mg/dL (ref 0.20–1.20)
Total Protein: 6.7 g/dL (ref 6.4–8.3)

## 2016-01-13 LAB — CBC WITH DIFFERENTIAL/PLATELET
BASO%: 1 % (ref 0.0–2.0)
Basophils Absolute: 0 10*3/uL (ref 0.0–0.1)
EOS%: 1.8 % (ref 0.0–7.0)
Eosinophils Absolute: 0.1 10*3/uL (ref 0.0–0.5)
HCT: 35.9 % (ref 34.8–46.6)
HGB: 12.6 g/dL (ref 11.6–15.9)
LYMPH%: 26.4 % (ref 14.0–49.7)
MCH: 32.2 pg (ref 25.1–34.0)
MCHC: 35.1 g/dL (ref 31.5–36.0)
MCV: 91.8 fL (ref 79.5–101.0)
MONO#: 0.4 10*3/uL (ref 0.1–0.9)
MONO%: 9.7 % (ref 0.0–14.0)
NEUT#: 2.3 10*3/uL (ref 1.5–6.5)
NEUT%: 61.1 % (ref 38.4–76.8)
PLATELETS: 202 10*3/uL (ref 145–400)
RBC: 3.91 10*6/uL (ref 3.70–5.45)
RDW: 13.3 % (ref 11.2–14.5)
WBC: 3.8 10*3/uL — ABNORMAL LOW (ref 3.9–10.3)
lymph#: 1 10*3/uL (ref 0.9–3.3)

## 2016-01-13 MED ORDER — OXYCODONE HCL 15 MG PO TABS: 15.0000 mg | ORAL_TABLET | ORAL | Status: DC | PRN

## 2016-01-13 NOTE — Progress Notes (Signed)
Unable to get in to exam room prior to MD.  No assessment performed.  

## 2016-01-13 NOTE — Telephone Encounter (Signed)
Left message for patient in regards to rx appt 3/16 being changed to 3/31 per second 3/2 pof

## 2016-01-13 NOTE — Progress Notes (Signed)
[place] Psychosocial Distress Screening Clinical Social Work  Clinical Social Work was referred by distress screening protocol.  The patient scored a 6 on the Psychosocial Distress Thermometer which indicates moderate distress. Clinical Social Worker phoned pt to assess for distress and other psychosocial needs. CSW introduced self and explained role of CSW/Pt and Family Support team. Pt shared she had stopped radiation and sounded down. She shared her anti-depression meds are helping some. She shared her main support is having trouble supporting her currently. CSW discussed options of additional supportive counseling at the [facility]. Pt very interested and CSW to follow up at next treatment.   ONCBCN DISTRESS SCREENING 12/22/2015  Screening Type Initial Screening  Distress experienced in past week (1-10) 6  Emotional problem type Depression;Nervousness/Anxiety;Adjusting to illness;Isolation/feeling alone;Feeling hopeless;Adjusting to appearance changes  Spiritual/Religous concerns type Facing my mortality  Physical Problem type Pain;Sleep/insomnia;Breathing;Talking;Constipation/diarrhea  Physician notified of physical symptoms Yes    Clinical Social Worker follow up needed: Yes.    If yes, follow up plan: See above Loren Racer, [place] Worker [place]  [facility] Phone: [phone] Fax: [phone]

## 2016-01-13 NOTE — Progress Notes (Signed)
Patient Care Team: Mackie Pai, PA-C as PCP - General (Physician Assistant) Thea Silversmith, MD as Consulting Physician (Radiation Oncology) Consuela Mimes, MD as Consulting Physician (Hematology and Oncology) Nicholas Lose, MD as Consulting Physician (Hematology and Oncology)  DIAGNOSIS: Breast cancer of lower-outer quadrant of left female breast [facility])   Staging form: Breast, AJCC 7th Edition     Pathologic: Stage IIIC (T4d, N3, cM0) - Signed by [name], MD on 01/19/2014   SUMMARY OF ONCOLOGIC HISTORY:   Breast cancer of lower-outer quadrant of left female breast ([place])   12/07/2010 - 04/12/2011 Neo-Adjuvant Chemotherapy Neoadjuvant FEC x6 followed by Taxotere x4   12/26/2010 Procedure Genetic testing showed mutation for BRCA2 [id number]   05/12/2011 Surgery Bilateral mastectomy with left axillary dissection 11/21 positive lymph nodes 5.8 cm tumor ER 95% PR 0% Ki-67 61% HER-2 negative ratio 1.39 T3, N3, 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   07/03/2011 - 08/17/2011 Radiation Therapy Radiation therapy to the chest and axilla   09/11/2011 -  Anti-estrogen oral therapy Tamoxifen later switched to Arimidex 07/2012   04/22/2012 Surgery Bilateral salpingo-oophorectomy   06/10/2015 Imaging Bone scan: uptake right lateral frontal bone, left malar region, manubrium, right 6,8 ribs; CT-CAP: New extensive infiltrate left & right hilar/mediastinal, right axillary, bil lower neck LN, 3.7 cm LUL consolidation, RML nodules,Lt Pl eff, RPLN   06/18/2015 Initial Biopsy Right axillary lymph node biopsy: 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, ER 90%, PR 0%, Ki-67 60%, HER-2 negative   06/29/2015 - 10/19/2015 Chemotherapy Halaven days 1 and 8 Q 3 weeks 6 cycles   11/01/2015 PET scan Focal patchy LUL opacities new/increased possibly reflecting infection or tumor, mild improving hypermet LN in Rt paratracheal and left infrahilar region, subcut mets to right shoulder and right post gluteal,  ext bone mets   11/08/2015 -  Anti-estrogen oral therapy Ibrance with Letrozole   12/30/2015 - 01/07/2016 Radiation Therapy Palliative radiation to the neck (stopped early due to esophagitis)    CHIEF COMPLIANT: Severe esophagitis related to recent radiation therapy  INTERVAL HISTORY: Tina Vaughan is a 55 year old with above-mentioned history metastatic breast cancer with painful neck metastases to underwent radiation therapy to the neck. Neck pain markedly improved but she developed sore throat which ultimately led to discontinuation of therapy. She is taking Magic mouthwash and multiple doses of pain medication but reports the pain is not improving. She ran out of her pain medication as well. She reports that she has difficulty with eating food. As soon as she stops swallowing, the pain returns.  REVIEW OF SYSTEMS:   Constitutional: Denies fevers, chills or abnormal weight loss Eyes: Denies blurriness of vision Ears, nose, mouth, throat, and face: Denies mucositis or sore throat Respiratory: Denies cough, dyspnea or wheezes Cardiovascular: Denies palpitation, chest discomfort Gastrointestinal:  Difficulty swallowing from radiation esophagitis Skin: Denies abnormal skin rashes Lymphatics: Denies new lymphadenopathy or easy bruising Neurological: Neuropathy, neck pain Behavioral/Psych: Mood is stable, no new changes  Extremities: No lower extremity edema  All other systems were reviewed with the patient and are negative.  I have reviewed the past medical history, past surgical history, social history and family history with the patient and they are unchanged from previous note.  ALLERGIES:  is allergic to doxycycline hydrochloride.  MEDICATIONS:  Current Outpatient Prescriptions  Medication Sig Dispense Refill  . buPROPion (WELLBUTRIN) 75 MG tablet Take 75 mg by mouth 2 (two) times daily.    . Calcium 200 MG TABS Take  200 mg by mouth daily.     . cholecalciferol (VITAMIN D) 1000 UNITS  tablet Take 1,000 Units by mouth daily.     . citalopram (CELEXA) 40 MG tablet TAKE 1 TABLET BY MOUTH DAILY. 30 tablet 5  . dexamethasone (DECADRON) 4 MG tablet Take 0.5 tablets (2 mg total) by mouth daily. (Patient not taking: Reported on 12/30/2015) 30 tablet 1  . fluocinonide cream (LIDEX) 8.25 % Apply 1 application topically 2 (two) times daily as needed (for rash.).    Marland Kitchen gabapentin (NEURONTIN) 300 MG capsule Take 1 capsule (300 mg total) by mouth daily. 30 capsule 5  . ibuprofen (ADVIL,MOTRIN) 200 MG tablet Take 600 mg by mouth every 8 (eight) hours as needed for moderate pain (pain).     Marland Kitchen letrozole (FEMARA) 2.5 MG tablet Take 1 tablet (2.5 mg total) by mouth daily. 90 tablet 0  . lidocaine-prilocaine (EMLA) cream Apply to affected area once (Patient taking differently: Apply 1 application topically daily as needed. For port) 30 g 3  . LORazepam (ATIVAN) 1 MG tablet Take 1 mg by mouth every 6 (six) hours as needed for anxiety.   0  . magic mouthwash w/lidocaine SOLN Take 5 mLs by mouth 3 (three) times daily as needed for mouth pain. 480 mL 1  . naproxen sodium (ANAPROX) 220 MG tablet Take 220-440 mg by mouth 2 (two) times daily as needed (for muscle aches.).     Marland Kitchen omeprazole (PRILOSEC) 20 MG capsule Take 1 capsule (20 mg total) by mouth daily. Take 2 x  30-60 min before first meal of the day 30 capsule 3  . ondansetron (ZOFRAN) 8 MG tablet TAKE 1 TABLET BY MOUTH 2 TIMES DAILY. START THE DAY AFTER CHEMO FOR 2 DAYS. THEN TAKE AS NEEDED FOR NAUSEA OR VOMITING. 30 tablet 1  . oxyCODONE (ROXICODONE) 15 MG immediate release tablet Take 1 tablet (15 mg total) by mouth every 4 (four) hours as needed for pain. 90 tablet 0  . oxyCODONE-acetaminophen (PERCOCET) 10-325 MG tablet Take 1-2 tablets q 4 hrs PRN pain. 30 tablet 0  . palbociclib (IBRANCE) 125 MG capsule Take 1 capsule (125 mg total) by mouth daily with breakfast. Take whole with food for 21 days, then take 7 days off (Patient not taking: Reported  on 01/07/2016) 21 capsule 2  . ranitidine (ZANTAC) 150 MG tablet 2 at bedtime (Patient taking differently: Take 300 mg by mouth at bedtime. 2 at bedtime)    . sucralfate (CARAFATE) 1 g tablet Dissolve 1 tablet in 10 mL H20 and swallow 30 min prior to meals and bedtime. 30 tablet 5  . UNABLE TO FIND Dispense compression sleeve 20-30 mm for this patient with history of breast cancer s/p surgery 1 each 0  . zolpidem (AMBIEN) 10 MG tablet TAKE 1 TABLET BY MOUTH AT BEDTIME. 30 tablet 2   No current facility-administered medications for this visit.    PHYSICAL EXAMINATION: ECOG PERFORMANCE STATUS: 1 - Symptomatic but completely ambulatory  Filed Vitals:   01/13/16 1019  BP: 112/75  Pulse: 108  Temp: 97.8 F (36.6 C)  Resp: 18   Filed Weights   01/13/16 1019  Weight: 205 lb (92.987 kg)    GENERAL:alert, no distress and comfortable SKIN: skin color, texture, turgor are normal, no rashes or significant lesions EYES: normal, Conjunctiva are pink and non-injected, sclera clear OROPHARYNX:no exudate, no erythema and lips, buccal mucosa, and tongue normal  NECK: Neck pain LYMPH:  no palpable lymphadenopathy in the cervical,  axillary or inguinal LUNGS: Chronic mild shortness of breath. No wheezing or crackles HEART: regular rate & rhythm and no murmurs and no lower extremity edema ABDOMEN:abdomen soft, non-tender and normal bowel sounds MUSCULOSKELETAL:no cyanosis of digits and no clubbing  NEURO: Grade 1 neuropathy EXTREMITIES: No lower extremity edema   LABORATORY DATA:  I have reviewed the data as listed   Chemistry      Component Value Date/Time   NA 140 01/13/2016 1003   NA 141 12/19/2015 1619   K 4.2 01/13/2016 1003   K 3.8 12/19/2015 1619   CL 106 12/19/2015 1619   CL 108* 07/30/2012 1430   CO2 26 01/13/2016 1003   CO2 27 12/19/2015 1604   BUN 10.3 01/13/2016 1003   BUN 10 12/19/2015 1619   CREATININE 0.8 01/13/2016 1003   CREATININE 0.60 12/19/2015 1619        Component Value Date/Time   CALCIUM 9.1 01/13/2016 1003   CALCIUM 8.5* 12/19/2015 1604   ALKPHOS 58 01/13/2016 1003   ALKPHOS 90 07/31/2015 1929   AST 13 01/13/2016 1003   AST 31 07/31/2015 1929   ALT 15 01/13/2016 1003   ALT 36 07/31/2015 1929   BILITOT 0.42 01/13/2016 1003   BILITOT 0.8 07/31/2015 1929      Lab Results  Component Value Date   WBC 3.8* 01/13/2016   HGB 12.6 01/13/2016   HCT 35.9 01/13/2016   MCV 91.8 01/13/2016   PLT 202 01/13/2016   NEUTROABS 2.3 01/13/2016   ASSESSMENT & PLAN:  Breast cancer of lower-outer quadrant of left female breast ([place]) Right axillary lymph node biopsy 06/18/2015: 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 BRCA 2 mutation underwent salpingo-oophorectomy Status post bilateral mastectomies and radiation to left chest wall and axilla currently on tamoxifen since October 2012) Prior treatment: Halaven day 1 and day 8 every 3 weeks 6 cycles started 06/29/2015 and completed 10/19/2015 PET/CT scan 11/01/2015:Focal patchy LUL opacities new/increased possibly reflecting infection or tumor, mild improving hypermet LN in Rt paratracheal and left infrahilar region, subcut mets to right shoulder and right post gluteal, ext bone mets ------------------------------------------------------------------------------------------------------------------------------------------------------- Current treatment: letrozole with Ibrance along with Zometa started 11/12/2015  Ibrance toxicities: 1. Peripheral  Neuropathy 2. Neutropenia: grade 1 3. Hot flashes related to letrozole   Shortness of breath: Probably related to malignancy. Her symptoms sent to get worse towards the end of the day. She had seen pulmonology previously.  Her blood count was reviewed and ANC is 2.3 . I recommended that she resume Ibrance with letrozole starting today.  Bone metastases: Zometa Q monthly, calcium plus vitamin D, pain from the bone metastases improved with Roxicodone. I renewed her prescription today. We will try to coordinate her Zometa treatments with her clinic follow-ups from next time.  Neck pain: Palliative radiation to the neck 12/30/2015 Esophagitis: I instructed her to take Magic mouthwash few minutes before eating food and to drink it with the help of a straw. And instruct her to take pain medication 30 minutes before eating food as well.  Return to clinic in  4 weeks    Orders Placed This Encounter  Procedures  . CBC with Differential    Standing Status: Future     Number of Occurrences:      Standing Expiration Date: 01/12/2017  . Comprehensive metabolic panel    Standing Status: Future     Number of Occurrences:      Standing Expiration Date: 01/12/2017   The patient has a good understanding of the overall plan. she agrees with it. she will call with any problems that may develop before the next visit here.   Rulon Eisenmenger, MD 01/13/2016

## 2016-01-13 NOTE — Telephone Encounter (Signed)
appt made and avs printed °

## 2016-01-17 ENCOUNTER — Telehealth: Payer: Self-pay | Admitting: Pharmacist

## 2016-01-17 NOTE — Telephone Encounter (Signed)
Opened in error

## 2016-01-18 ENCOUNTER — Telehealth: Payer: Self-pay | Admitting: *Deleted

## 2016-01-18 ENCOUNTER — Emergency Department (HOSPITAL_COMMUNITY)
Admission: EM | Admit: 2016-01-18 | Discharge: 2016-01-19 | Disposition: A | Discharge status: Home / Self Care | Payer: Medicare Other | Attending: Emergency Medicine | Admitting: Emergency Medicine

## 2016-01-18 ENCOUNTER — Telehealth: Payer: Self-pay | Admitting: Pharmacist

## 2016-01-18 ENCOUNTER — Encounter (HOSPITAL_COMMUNITY): Payer: Self-pay | Admitting: Emergency Medicine

## 2016-01-18 DIAGNOSIS — Z872 Personal history of diseases of the skin and subcutaneous tissue: Secondary | ICD-10-CM | POA: Diagnosis not present

## 2016-01-18 DIAGNOSIS — F419 Anxiety disorder, unspecified: Secondary | ICD-10-CM | POA: Insufficient documentation

## 2016-01-18 DIAGNOSIS — Z853 Personal history of malignant neoplasm of breast: Secondary | ICD-10-CM | POA: Diagnosis not present

## 2016-01-18 DIAGNOSIS — G629 Polyneuropathy, unspecified: Secondary | ICD-10-CM | POA: Insufficient documentation

## 2016-01-18 DIAGNOSIS — Z79899 Other long term (current) drug therapy: Secondary | ICD-10-CM | POA: Insufficient documentation

## 2016-01-18 DIAGNOSIS — Z8679 Personal history of other diseases of the circulatory system: Secondary | ICD-10-CM | POA: Diagnosis not present

## 2016-01-18 DIAGNOSIS — F329 Major depressive disorder, single episode, unspecified: Secondary | ICD-10-CM | POA: Diagnosis not present

## 2016-01-18 DIAGNOSIS — K208 Other esophagitis without bleeding: Secondary | ICD-10-CM

## 2016-01-18 DIAGNOSIS — K209 Esophagitis, unspecified: Secondary | ICD-10-CM | POA: Diagnosis not present

## 2016-01-18 DIAGNOSIS — E860 Dehydration: Secondary | ICD-10-CM | POA: Diagnosis not present

## 2016-01-18 DIAGNOSIS — E669 Obesity, unspecified: Secondary | ICD-10-CM | POA: Insufficient documentation

## 2016-01-18 DIAGNOSIS — R130 Aphagia: Secondary | ICD-10-CM | POA: Diagnosis present

## 2016-01-18 LAB — CBC WITH DIFFERENTIAL/PLATELET
Basophils Absolute: 0 10*3/uL (ref 0.0–0.1)
Basophils Relative: 0 %
EOS ABS: 0.1 10*3/uL (ref 0.0–0.7)
Eosinophils Relative: 1 %
HCT: 32.7 % — ABNORMAL LOW (ref 36.0–46.0)
HEMOGLOBIN: 11.6 g/dL — AB (ref 12.0–15.0)
LYMPHS ABS: 1.5 10*3/uL (ref 0.7–4.0)
LYMPHS PCT: 19 %
MCH: 32.6 pg (ref 26.0–34.0)
MCHC: 35.5 g/dL (ref 30.0–36.0)
MCV: 91.9 fL (ref 78.0–100.0)
MONOS PCT: 5 %
Monocytes Absolute: 0.4 10*3/uL (ref 0.1–1.0)
NEUTROS PCT: 75 %
Neutro Abs: 5.7 10*3/uL (ref 1.7–7.7)
Platelets: ADEQUATE 10*3/uL (ref 150–400)
RBC: 3.56 MIL/uL — AB (ref 3.87–5.11)
RDW: 12.7 % (ref 11.5–15.5)
WBC: 7.7 10*3/uL (ref 4.0–10.5)

## 2016-01-18 LAB — COMPREHENSIVE METABOLIC PANEL
ALBUMIN: 5 g/dL (ref 3.5–5.0)
ALK PHOS: 47 U/L (ref 38–126)
ALT: 16 U/L (ref 14–54)
AST: 18 U/L (ref 15–41)
Anion gap: 12 (ref 5–15)
BILIRUBIN TOTAL: 0.9 mg/dL (ref 0.3–1.2)
BUN: 14 mg/dL (ref 6–20)
CALCIUM: 9.7 mg/dL (ref 8.9–10.3)
CO2: 22 mmol/L (ref 22–32)
Chloride: 110 mmol/L (ref 101–111)
Creatinine, Ser: 0.61 mg/dL (ref 0.44–1.00)
GFR calc Af Amer: >60 mL/min (ref >60)
Glucose, Bld: 121 mg/dL — ABNORMAL HIGH (ref 65–99)
POTASSIUM: 4.2 mmol/L (ref 3.5–5.1)
Sodium: 144 mmol/L (ref 135–145)
TOTAL PROTEIN: 8 g/dL (ref 6.5–8.1)

## 2016-01-18 LAB — I-STAT CG4 LACTIC ACID, ED: Lactic Acid, Venous: 1.05 mmol/L (ref 0.5–2.0)

## 2016-01-18 MED ORDER — SODIUM CHLORIDE 0.9 % IV BOLUS (SEPSIS)
1000.0000 mL | Freq: Once | INTRAVENOUS | Status: AC
  Administered 2016-01-19: 1000 mL via INTRAVENOUS

## 2016-01-18 MED ORDER — HYDROMORPHONE HCL 1 MG/ML IJ SOLN
1.0000 mg | Freq: Once | INTRAMUSCULAR | Status: AC
  Administered 2016-01-19: 1 mg via INTRAVENOUS
  Filled 2016-01-18: qty 1

## 2016-01-18 MED ORDER — ONDANSETRON HCL 4 MG/2ML IJ SOLN
4.0000 mg | Freq: Once | INTRAMUSCULAR | Status: AC
  Administered 2016-01-18: 4 mg via INTRAVENOUS
  Filled 2016-01-18: qty 2

## 2016-01-18 MED ORDER — OXYCODONE-ACETAMINOPHEN 5-325 MG PO TABS
1.0000 | ORAL_TABLET | Freq: Once | ORAL | Status: AC
Start: 2016-01-18 — End: 2016-01-18
  Administered 2016-01-18: 1 via ORAL
  Filled 2016-01-18: qty 1

## 2016-01-18 NOTE — Telephone Encounter (Signed)
..  Oral Chemotherapy Follow-Up Form  Original Start date of oral chemotherapy: __12/23/16_____   Called patient today to follow up regarding patient's oral chemotherapy medication: Ibrance Pt is short of breath, she states she is recovering from XRT and is having a hard time talking due to SOB. She has had no problems with Ibrance and knows to call us if she does.  Pt reports __0__ tablets/doses missed in the last week/month.    We will follow up and call patient again in __4 weeks___   Thank you,  Theone Murdoch, PharmD, [facility]

## 2016-01-18 NOTE — ED Provider Notes (Signed)
CSN: [id number]     Arrival date & time 01/18/16  1647 History   By signing my name below, I, Forrestine Him, attest that this documentation has been prepared under the direction and in the presence of Orpah Greek, MD.  Electronically Signed: Forrestine Him, ED Scribe. 01/18/2016. 11:31 PM.   Chief Complaint  Patient presents with  . Dehydration    cancer pt   The history is provided by the patient. No language interpreter was used.    HPI Comments: Tina Vaughan is a 55 y.o. female with a PMHx of stage 4 breast cancer and thyroid disease who presents to the Emergency Department here for possible dehydration this evening. Pt states she has been unable to eat or drunk x 1 week. Pt recently underwent rounds of radiation treatments for breast cancer which has metastasized to her lymph nodes and bones. She was advised to come into the Emergency Department for IV fluids and assessment. Pts last radiation treatment was 2/24 as she states "i just couldn't go through with the remainder of the treatments". No recent fever or chills.  PCP: Elise Benne    Past Medical History  Diagnosis Date  . Frequent urination   . Depression   . Sleep apnea   . Eczema   . Neuromuscular disorder ([place])   . GERD (gastroesophageal reflux disease)   . Neuropathy ([place])     due to left lymph node dissection  . Lymphedema   . Anxiety   . Thyroid disease     Possible would like to be evaluated  . Headache   . Obesity   . Breast cancer, stage 3 ([place]) 2012    left; inflammatory   Past Surgical History  Procedure Laterality Date  . Mastectomy Bilateral 05/12/11    total mastectomy on right, radical mastectomy on left  . Portacath placement    . Laparoscopy  03/14/2012    Procedure: LAPAROSCOPY OPERATIVE;  Surgeon: Osborne Oman, MD;  Location: [facility];  Service: Gynecology;  Laterality: N/A;  . Salpingoophorectomy  03/14/2012    Procedure: SALPINGO OOPHERECTOMY;  Surgeon: Osborne Oman, MD;   Location: [facility];  Service: Gynecology;  Laterality: Bilateral;  . Port-a-cath removal  04/19/2012    Procedure: MINOR REMOVAL PORT-A-CATH;  Surgeon: Haywood Lasso, MD;  Location: [facility];  Service: General;  Laterality: N/A;  . Oophorectomy    . Portacath      Placement    Family History  Problem Relation Age of Onset  . Breast cancer Mother   . Stroke Father     heavy smoker  . Hypertension Father   . Pancreatic cancer Mother     pancreatic cancer x 2  . Diabetes Mother     after pancreas removed  . Heart disease Father   . Colon cancer Neg Hx   . Esophageal cancer Neg Hx   . Colitis Neg Hx   . Crohn's disease Neg Hx   . Healthy Sister   . Migraines Sister   . Emphysema Paternal Grandfather     smoked a pipe   Social History  Substance Use Topics  . Smoking status: Never Smoker   . Smokeless tobacco: Never Used  . Alcohol Use: 0.0 oz/week    0 Standard drinks or equivalent per week     Comment: rare   OB History    Gravida Para Term Preterm AB TAB SAB Ectopic Multiple Living   1  1 1         Review of Systems  Constitutional: Positive for appetite change. Negative for fever and chills.  Respiratory: Negative for cough and shortness of breath.   Cardiovascular: Negative for chest pain.  Gastrointestinal: Negative for abdominal pain.  Neurological: Negative for headaches.  Psychiatric/Behavioral: Negative for confusion.  All other systems reviewed and are negative.     Allergies  Doxycycline hydrochloride  Home Medications   Prior to Admission medications   Medication Sig Start Date End Date Taking? Authorizing Provider  buPROPion (WELLBUTRIN) 75 MG tablet Take 75 mg by mouth 2 (two) times daily.   Yes Historical Provider, MD  Calcium 200 MG TABS Take 200 mg by mouth daily.    Yes Historical Provider, MD  cholecalciferol (VITAMIN D) 1000 UNITS tablet Take 1,000 Units by mouth daily.    Yes Historical Provider, MD  citalopram  (CELEXA) 40 MG tablet TAKE 1 TABLET BY MOUTH DAILY. 09/30/14  Yes Nicholas Lose, MD  fluocinonide cream (LIDEX) [id number] % Apply 1 application topically 2 (two) times daily as needed (for rash.).   Yes Historical Provider, MD  gabapentin (NEURONTIN) 300 MG capsule Take 1 capsule (300 mg total) by mouth daily. 02/10/14  Yes Consuela Mimes, MD  ibuprofen (ADVIL,MOTRIN) 200 MG tablet Take 600 mg by mouth every 8 (eight) hours as needed for moderate pain (pain).    Yes Historical Provider, MD  letrozole (FEMARA) 2.5 MG tablet Take 1 tablet (2.5 mg total) by mouth daily. 11/02/15  Yes Nicholas Lose, MD  lidocaine-prilocaine (EMLA) cream Apply to affected area once Patient taking differently: Apply 1 application topically daily as needed. For port 06/24/15  Yes Nicholas Lose, MD  LORazepam (ATIVAN) 1 MG tablet Take 1 mg by mouth every 6 (six) hours as needed (nausea.). Reported on 01/18/2016 07/20/15  Yes Historical Provider, MD  magic mouthwash w/lidocaine SOLN Take 5 mLs by mouth 3 (three) times daily as needed for mouth pain. 01/07/16  Yes Thea Silversmith, MD  naproxen sodium (ANAPROX) 220 MG tablet Take 220-440 mg by mouth 2 (two) times daily as needed (for muscle aches.).    Yes Historical Provider, MD  omeprazole (PRILOSEC) 20 MG capsule Take 1 capsule (20 mg total) by mouth daily. Take 2 x  30-60 min before first meal of the day 09/07/15  Yes Nicholas Lose, MD  ondansetron (ZOFRAN) 8 MG tablet TAKE 1 TABLET BY MOUTH 2 TIMES DAILY. START THE DAY AFTER CHEMO FOR 2 DAYS. THEN TAKE AS NEEDED FOR NAUSEA OR VOMITING. 08/26/15  Yes Nicholas Lose, MD  oxyCODONE (ROXICODONE) 15 MG immediate release tablet Take 1 tablet (15 mg total) by mouth every 4 (four) hours as needed for pain. 01/13/16  Yes Nicholas Lose, MD  palbociclib Leslee Home) 125 MG capsule Take 1 capsule (125 mg total) by mouth daily with breakfast. Take whole with food for 21 days, then take 7 days off 12/10/15  Yes Nicholas Lose, MD  ranitidine (ZANTAC) 150 MG tablet 2  at bedtime Patient taking differently: Take 300 mg by mouth at bedtime. 2 at bedtime 08/27/15  Yes Tanda Rockers, MD  sucralfate (CARAFATE) 1 g tablet Dissolve 1 tablet in 10 mL H20 and swallow 30 min prior to meals and bedtime. 01/10/16  Yes Eppie Gibson, MD  UNABLE TO FIND Dispense compression sleeve 20-30 mm for this patient with history of breast cancer s/p surgery 09/14/14  Yes Nicholas Lose, MD  zolpidem (AMBIEN) 10 MG tablet TAKE 1 TABLET BY MOUTH AT  BEDTIME. 01/03/16  Yes Nicholas Lose, MD  dexamethasone (DECADRON) 4 MG tablet Take 0.5 tablets (2 mg total) by mouth daily. Patient not taking: Reported on 12/30/2015 07/13/15   Nicholas Lose, MD  oxyCODONE-acetaminophen (PERCOCET) 10-325 MG tablet Take 1-2 tablets q 4 hrs PRN pain. Patient not taking: Reported on 01/18/2016 01/10/16   Eppie Gibson, MD   Triage Vitals: BP 120/88 mmHg  Pulse 92  Temp(Src) 98.8 F (37.1 C) (Oral)  Resp 16  SpO2 96%   Physical Exam  Constitutional: She is oriented to person, place, and time. She appears well-developed and well-nourished. No distress.  HENT:  Head: Normocephalic and atraumatic.  Right Ear: Hearing normal.  Left Ear: Hearing normal.  Nose: Nose normal.  Mouth/Throat: Oropharynx is clear and moist and mucous membranes are normal.  Eyes: Conjunctivae and EOM are normal. Pupils are equal, round, and reactive to light.  Neck: Normal range of motion. Neck supple.  Cardiovascular: Regular rhythm, S1 normal and S2 normal.  Exam reveals no gallop and no friction rub.   No murmur heard. Pulmonary/Chest: Effort normal and breath sounds normal. No respiratory distress. She exhibits no tenderness.  Abdominal: Soft. Normal appearance and bowel sounds are normal. There is no hepatosplenomegaly. There is no tenderness. There is no rebound, no guarding, no tenderness at McBurney's point and negative Murphy's sign. No hernia.  Musculoskeletal: Normal range of motion.  Neurological: She is alert and oriented to  person, place, and time. She has normal strength. No cranial nerve deficit or sensory deficit. Coordination normal. GCS eye subscore is 4. GCS verbal subscore is 5. GCS motor subscore is 6.  Skin: Skin is warm, dry and intact. No rash noted. No cyanosis.  Psychiatric: She has a normal mood and affect. Her speech is normal and behavior is normal. Thought content normal.  Nursing note and vitals reviewed.   ED Course  Procedures (including critical care time)  DIAGNOSTIC STUDIES: Oxygen Saturation is 100% on RA, Normal by my interpretation.    COORDINATION OF CARE: 11:18 PM- Will order CBC, CMP, Urinalysis, and i-stat CG4 lactic acid. Will give fluids, Dilaudid, Percocet, and Zofran. Discussed treatment plan with pt at bedside and pt agreed to plan.     Labs Review Labs Reviewed  COMPREHENSIVE METABOLIC PANEL - Abnormal; Notable for the following:    Glucose, Bld 121 (*)    All other components within normal limits  CBC WITH DIFFERENTIAL/PLATELET - Abnormal; Notable for the following:    RBC 3.56 (*)    Hemoglobin 11.6 (*)    HCT 32.7 (*)    All other components within normal limits  URINE CULTURE  URINALYSIS, ROUTINE W REFLEX MICROSCOPIC (NOT AT [facility])  I-STAT CG4 LACTIC ACID, ED  I-STAT CG4 LACTIC ACID, ED    Imaging Review No results found. I have personally reviewed and evaluated these images and lab results as part of my medical decision-making.   EKG Interpretation None      MDM   Final diagnoses:  Dehydration  Radiation esophagitis    Patient presents to the ER for evaluation possible dehydration. Patient is currently being treated for stage IV breast cancer. She is receiving palliative radiation therapy and has developed what sounds like radiation esophagitis. She is having difficulty swallowing solids and liquids because of pain. Patient was administered IV fluids and analgesia here in the ER. She was also administered viscous lidocaine. She has had only minimal  improvement.  She was, however, grossly hydrated and feels better. Tachycardia has  resolved.  I discussed options with the patient. She does not wish to pursue PEG tube placement. She does not want to be hospitalized tonight. She has Roxicodone and Magic mouthwash to use at home. We will administer Decadron to see if there is some improvement of inflammatory response of the throat to help improve her swallowing ability. Follow-up with oncology. Return if symptoms worsen.  I personally performed the services described in this documentation, which was scribed in my presence. The recorded information has been reviewed and is accurate.   Orpah Greek, MD 01/19/16 [phone]

## 2016-01-18 NOTE — Progress Notes (Addendum)
  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   She did develop significatn esophagitis for which she finished treatment early by her choice.  She was seen after treatment and treated with IVF and pain medications for odynophagia.   Plan: The patient has completed radiation treatment. The patient will return to radiation oncology clinic for routine followup in one month. I advised them to call or return sooner if they have any questions or concerns related to their recovery or treatment.  ------------------------------------------------  Thea Silversmith, MD

## 2016-01-18 NOTE — Telephone Encounter (Signed)
Spoke with Tina Vaughan stated she was having some SOB,  has not been able to eat in a few days very well and just not feeling well.  Asked to go to the ED for evaluation because of the SOB.

## 2016-01-18 NOTE — ED Notes (Addendum)
Pt states she is receiving radiation for CA that began as breast cancer then spread to her lymph nodes and bone. Pt states that she has been unable to eat or drink much for about a week. Pt states that her oncology nurse suggested she come in for IVF and assessment. Pt's last radiation treatment was 2/24. Pt was unable to complete the full radiation treatment, due to the side effects Pt has increased throat pain (from radiation) and headache  Pt states she would like her blood work to be drawn when her port is accessed

## 2016-01-19 ENCOUNTER — Encounter: Payer: Self-pay | Admitting: Radiation Oncology

## 2016-01-19 ENCOUNTER — Ambulatory Visit
Admission: RE | Admit: 2016-01-19 | Discharge: 2016-01-19 | Disposition: A | Discharge status: Home / Self Care | Payer: Medicare Other | Source: Ambulatory Visit | Attending: Radiation Oncology | Admitting: Radiation Oncology

## 2016-01-19 ENCOUNTER — Other Ambulatory Visit: Payer: Self-pay | Admitting: *Deleted

## 2016-01-19 ENCOUNTER — Ambulatory Visit (HOSPITAL_BASED_OUTPATIENT_CLINIC_OR_DEPARTMENT_OTHER): Payer: Medicare Other

## 2016-01-19 VITALS — BP 116/75 | HR 97 | Temp 98.5°F | Ht 62.0 in

## 2016-01-19 VITALS — BP 116/70 | HR 93 | Resp 17

## 2016-01-19 DIAGNOSIS — C50912 Malignant neoplasm of unspecified site of left female breast: Secondary | ICD-10-CM

## 2016-01-19 DIAGNOSIS — C7951 Secondary malignant neoplasm of bone: Secondary | ICD-10-CM

## 2016-01-19 DIAGNOSIS — C50512 Malignant neoplasm of lower-outer quadrant of left female breast: Secondary | ICD-10-CM

## 2016-01-19 DIAGNOSIS — C773 Secondary and unspecified malignant neoplasm of axilla and upper limb lymph nodes: Secondary | ICD-10-CM | POA: Diagnosis not present

## 2016-01-19 MED ORDER — LIDOCAINE VISCOUS 2 % MT SOLN
15.0000 mL | Freq: Once | OROMUCOSAL | Status: AC
  Administered 2016-01-19: 15 mL via OROMUCOSAL
  Filled 2016-01-19: qty 15

## 2016-01-19 MED ORDER — SODIUM CHLORIDE 0.9% FLUSH
10.0000 mL | INTRAVENOUS | Status: DC | PRN
  Administered 2016-01-19: 10 mL via INTRAVENOUS
  Filled 2016-01-19: qty 10

## 2016-01-19 MED ORDER — HEPARIN SOD (PORK) LOCK FLUSH 100 UNIT/ML IV SOLN
500.0000 [IU] | Freq: Once | INTRAVENOUS | Status: AC
  Administered 2016-01-19: 500 [IU] via INTRAVENOUS
  Filled 2016-01-19: qty 5

## 2016-01-19 MED ORDER — HEPARIN SOD (PORK) LOCK FLUSH 100 UNIT/ML IV SOLN
500.0000 [IU] | Freq: Once | INTRAVENOUS | Status: AC
  Administered 2016-01-19: 500 [IU]
  Filled 2016-01-19: qty 5

## 2016-01-19 MED ORDER — HYDROMORPHONE HCL 1 MG/ML IJ SOLN
1.0000 mg | Freq: Once | INTRAMUSCULAR | Status: AC
  Administered 2016-01-19: 1 mg via INTRAVENOUS
  Filled 2016-01-19: qty 1

## 2016-01-19 MED ORDER — SODIUM CHLORIDE 0.9 % IV SOLN
Freq: Once | INTRAVENOUS | Status: AC
  Administered 2016-01-19: 13:00:00 via INTRAVENOUS

## 2016-01-19 MED ORDER — DEXAMETHASONE SODIUM PHOSPHATE 10 MG/ML IJ SOLN
10.0000 mg | Freq: Once | INTRAMUSCULAR | Status: AC
  Administered 2016-01-19: 10 mg via INTRAVENOUS
  Filled 2016-01-19: qty 1

## 2016-01-19 NOTE — Progress Notes (Signed)
Radiation Oncology         [phone] ________________________________  Name: Tina Vaughan MRN: [id number]  Date: 01/19/2016  DOB: June 25, 1961  Follow-Up Visit Note  CC: Saguier, Jacqualine Code, MD  No diagnosis found.  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        Interval Since Last Radiation:  12 days  12/30/15 - 01/07/16: Cervical spine C1-C7  2012: To the left chest wall and axilla.  Narrative:  The patient returns today for routine follow-up. The patient presented to the ED last night for possible dehydration since she was unable to eat or drink for a week. She was given fluids, Dilaudid, Percocet, and Zofran and discharged earlier today. She is experiencing difficulty swallowing with pain since having radiation therapy to her C-spine and stopped therapy prior to her last 3 treatments. IV fluids today via med/onc was ordered by myself. She admits to not taking her Oxycodone, but took it during infusion today and is sporadically using Carafate and is taking Magic Mouthwash. The nurse explained the efficacy of taking all medications as ordered to facilitate a decrease in pain and difficulty swallowing. She stated understanding. She has a headache that is a 4/10 today. She denies hemoptysis, but is coughing up "black specks." She also reports a poor appetite.  ALLERGIES:  is allergic to doxycycline hydrochloride.  Meds: Current Outpatient Prescriptions  Medication Sig Dispense Refill  . buPROPion (WELLBUTRIN) 75 MG tablet Take 75 mg by mouth 2 (two) times daily.    . Calcium 200 MG TABS Take 200 mg by mouth daily.     . cholecalciferol (VITAMIN D) 1000 UNITS tablet Take 1,000 Units by mouth daily.     . citalopram (CELEXA) 40 MG tablet TAKE 1 TABLET BY MOUTH DAILY. 30 tablet 5  .  dexamethasone (DECADRON) 4 MG tablet Take 0.5 tablets (2 mg total) by mouth daily. (Patient not taking: Reported on 12/30/2015) 30 tablet 1  . fluocinonide cream (LIDEX) [id number] % Apply 1 application topically 2 (two) times daily as needed (for rash.).    Marland Kitchen gabapentin (NEURONTIN) 300 MG capsule Take 1 capsule (300 mg total) by mouth daily. 30 capsule 5  . ibuprofen (ADVIL,MOTRIN) 200 MG tablet Take 600 mg by mouth every 8 (eight) hours as needed for moderate pain (pain).     Marland Kitchen letrozole (FEMARA) 2.5 MG tablet Take 1 tablet (2.5 mg total) by mouth daily. 90 tablet 0  . lidocaine-prilocaine (EMLA) cream Apply to affected area once (Patient taking differently: Apply 1 application topically daily as needed. For port) 30 g 3  . LORazepam (ATIVAN) 1 MG tablet Take 1 mg by mouth every 6 (six) hours as needed (nausea.). Reported on 01/18/2016  0  . magic mouthwash w/lidocaine SOLN Take 5 mLs by mouth 3 (three) times daily as needed for mouth pain. 480 mL 1  . naproxen sodium (ANAPROX) 220 MG tablet Take 220-440 mg by mouth 2 (two) times daily as needed (for muscle aches.).     Marland Kitchen omeprazole (PRILOSEC) 20 MG capsule Take 1 capsule (20 mg total) by mouth daily. Take 2 x  30-60 min before first meal of the day 30 capsule 3  . ondansetron (ZOFRAN) 8 MG tablet TAKE 1 TABLET BY MOUTH 2 TIMES DAILY.  START THE DAY AFTER CHEMO FOR 2 DAYS. THEN TAKE AS NEEDED FOR NAUSEA OR VOMITING. 30 tablet 1  . oxyCODONE (ROXICODONE) 15 MG immediate release tablet Take 1 tablet (15 mg total) by mouth every 4 (four) hours as needed for pain. 90 tablet 0  . oxyCODONE-acetaminophen (PERCOCET) 10-325 MG tablet Take 1-2 tablets q 4 hrs PRN pain. (Patient not taking: Reported on 01/18/2016) 30 tablet 0  . palbociclib (IBRANCE) 125 MG capsule Take 1 capsule (125 mg total) by mouth daily with breakfast. Take whole with food for 21 days, then take 7 days off 21 capsule 2  . ranitidine (ZANTAC) 150 MG tablet 2 at bedtime (Patient taking differently:  Take 300 mg by mouth at bedtime. 2 at bedtime)    . sucralfate (CARAFATE) 1 g tablet Dissolve 1 tablet in 10 mL H20 and swallow 30 min prior to meals and bedtime. 30 tablet 5  . UNABLE TO FIND Dispense compression sleeve 20-30 mm for this patient with history of breast cancer s/p surgery 1 each 0  . zolpidem (AMBIEN) 10 MG tablet TAKE 1 TABLET BY MOUTH AT BEDTIME. 30 tablet 2   No current facility-administered medications for this encounter.    Physical Findings: The patient is in no acute distress. Patient is alert and oriented.  height is 5\' 2"  (1.575 m). Her temperature is 98.5 F (36.9 C). Her blood pressure is 116/75 and her pulse is 97. Her oxygen saturation is 99%.   She presents to the clinic in a wheelchair. Lungs are clear to auscultation bilaterally. Heart has regular rate and rhythm. Oropharynx is clear with no sign of thrush.  Minimal erythema along the neck region. No skin breakdown  Lab Findings: Lab Results  Component Value Date   WBC 7.7 01/18/2016   HGB 11.6* 01/18/2016   HCT 32.7* 01/18/2016   MCV 91.9 01/18/2016   PLT  01/18/2016    PLATELET CLUMPS NOTED ON SMEAR, COUNT APPEARS ADEQUATE    Radiographic Findings: No results found.  Impression:  The patient is recovering from the effects of radiation. Persistent pharyngitis at this point.  Plan: She is scheduled to see Dr. Lindi Adie on 3/31. She will keep her scheduled appointment with Dr. Pablo Ledger. She will contact us later this week if she should need additional fluids before the weekend. ____________________________________ -----------------------------------  Blair Promise, PhD, MD  This document serves as a record of services personally performed by Gery Pray, MD. It was created on his behalf by Darcus Austin, a trained medical scribe. The creation of this record is based on the scribe's personal observations and the provider's statements to them. This document has been checked and approved by the attending  provider.

## 2016-01-19 NOTE — ED Notes (Signed)
Pt does not want blood cultures done.

## 2016-01-19 NOTE — Progress Notes (Addendum)
Tina Vaughan here today since having ED encounter on yesterday where she was diagnosed as being dehydrated.  She received received 2 1/2 bags of IVF to rehydrate.  She is experiencing difficulty swallowing with pain since having radiation therapy to her C-spine and stopped therapy prior to her last 3 treatments.  IV fluids today via Medonc as ordered by Dr. Sondra Come.  She is here for review.  She admits to not taking her Oxycodone, but took it while in infusion today and is sporadically using Carafate and is taking Magic Mouthwash.  Explained the efficacy of taking all medications as ordered to facilitate a decrease in pain and difficulty swallowing. She stated understanding. BP 116/75, and pulse 102, O2 Sat.  She lives alone, but has a friend that checks on her daily. Headache at a level 4 today.

## 2016-01-19 NOTE — ED Notes (Signed)
Patient was alert, oriented and stable upon discharge. RN went over AVS and patient had no further questions.  

## 2016-01-19 NOTE — Patient Instructions (Signed)

## 2016-01-19 NOTE — Discharge Instructions (Signed)

## 2016-01-27 ENCOUNTER — Other Ambulatory Visit: Payer: Medicare Other

## 2016-01-27 ENCOUNTER — Ambulatory Visit: Payer: Medicare Other

## 2016-02-10 NOTE — Assessment & Plan Note (Signed)
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 BRCA 2 mutation underwent salpingo-oophorectomy Status post bilateral mastectomies and radiation to left chest wall and axilla currently on tamoxifen since October 2012) Prior treatment: Halaven day 1 and day 8 every 3 weeks 6 cycles started 06/29/2015 and completed 10/19/2015 PET/CT scan 11/01/2015:Focal patchy LUL opacities new/increased possibly reflecting infection or tumor, mild improving hypermet LN in Rt paratracheal and left infrahilar region, subcut mets to right shoulder and right post gluteal, ext bone mets ------------------------------------------------------------------------------------------------------------------------------------------------------- Current treatment: letrozole with Ibrance along with Zometa started 11/12/2015  Ibrance toxicities: 1. Peripheral Neuropathy 2. Neutropenia: grade 1 3. Hot flashes related to letrozole   Shortness of breath: Probably related to malignancy. Her symptoms sent to get worse towards the end of the day. She had seen pulmonology previously.  Her blood count was reviewed and ANC is 2.3 . I recommended that she resume Ibrance with letrozole starting today.  Bone metastases: Zometa Q monthly, calcium plus vitamin D, pain from the bone metastases improved with Roxicodone. I renewed her prescription today. We will try to coordinate her Zometa treatments with her clinic follow-ups from next time.  Neck  pain: Palliative radiation to the neck 12/30/2015 Esophagitis: I instructed her to take Magic mouthwash few minutes before eating food and to drink it with the help of a straw. And instructed her to take pain medication 30 minutes before eating food as well.  Return to clinic in 4 weeks

## 2016-02-11 ENCOUNTER — Telehealth: Payer: Self-pay | Admitting: Hematology and Oncology

## 2016-02-11 ENCOUNTER — Ambulatory Visit (HOSPITAL_BASED_OUTPATIENT_CLINIC_OR_DEPARTMENT_OTHER): Payer: Medicare Other

## 2016-02-11 ENCOUNTER — Encounter: Payer: Self-pay | Admitting: Hematology and Oncology

## 2016-02-11 ENCOUNTER — Ambulatory Visit (HOSPITAL_BASED_OUTPATIENT_CLINIC_OR_DEPARTMENT_OTHER): Payer: Medicare Other | Admitting: Hematology and Oncology

## 2016-02-11 ENCOUNTER — Other Ambulatory Visit (HOSPITAL_BASED_OUTPATIENT_CLINIC_OR_DEPARTMENT_OTHER): Payer: Medicare Other

## 2016-02-11 VITALS — BP 120/85 | HR 98 | Temp 98.1°F | Resp 18 | Wt 200.8 lb

## 2016-02-11 DIAGNOSIS — C7951 Secondary malignant neoplasm of bone: Secondary | ICD-10-CM

## 2016-02-11 DIAGNOSIS — C50912 Malignant neoplasm of unspecified site of left female breast: Secondary | ICD-10-CM

## 2016-02-11 DIAGNOSIS — C50512 Malignant neoplasm of lower-outer quadrant of left female breast: Secondary | ICD-10-CM

## 2016-02-11 DIAGNOSIS — R110 Nausea: Secondary | ICD-10-CM

## 2016-02-11 DIAGNOSIS — R112 Nausea with vomiting, unspecified: Secondary | ICD-10-CM

## 2016-02-11 DIAGNOSIS — T451X5A Adverse effect of antineoplastic and immunosuppressive drugs, initial encounter: Secondary | ICD-10-CM

## 2016-02-11 LAB — CBC WITH DIFFERENTIAL/PLATELET
BASO%: 0.4 % (ref 0.0–2.0)
BASOS ABS: 0 10*3/uL (ref 0.0–0.1)
EOS ABS: 0.1 10*3/uL (ref 0.0–0.5)
EOS%: 2.1 % (ref 0.0–7.0)
HEMATOCRIT: 35.6 % (ref 34.8–46.6)
HGB: 12.6 g/dL (ref 11.6–15.9)
LYMPH%: 33.8 % (ref 14.0–49.7)
MCH: 32 pg (ref 25.1–34.0)
MCHC: 35.4 g/dL (ref 31.5–36.0)
MCV: 90.4 fL (ref 79.5–101.0)
MONO#: 0.2 10*3/uL (ref 0.1–0.9)
MONO%: 6.8 % (ref 0.0–14.0)
NEUT#: 1.3 10*3/uL — ABNORMAL LOW (ref 1.5–6.5)
NEUT%: 56.9 % (ref 38.4–76.8)
PLATELETS: 124 10*3/uL — AB (ref 145–400)
RBC: 3.94 10*6/uL (ref 3.70–5.45)
RDW: 13.5 % (ref 11.2–14.5)
WBC: 2.3 10*3/uL — AB (ref 3.9–10.3)
lymph#: 0.8 10*3/uL — ABNORMAL LOW (ref 0.9–3.3)
nRBC: 0 % (ref 0–0)

## 2016-02-11 LAB — COMPREHENSIVE METABOLIC PANEL
ALT: 16 U/L (ref 0–55)
AST: 12 U/L (ref 5–34)
Albumin: 3.9 g/dL (ref 3.5–5.0)
Alkaline Phosphatase: 39 U/L — ABNORMAL LOW (ref 40–150)
Anion Gap: 6 mEq/L (ref 3–11)
BILIRUBIN TOTAL: 0.48 mg/dL (ref 0.20–1.20)
BUN: 8 mg/dL (ref 7.0–26.0)
CALCIUM: 9.3 mg/dL (ref 8.4–10.4)
CHLORIDE: 108 meq/L (ref 98–109)
CO2: 28 mEq/L (ref 22–29)
CREATININE: 0.7 mg/dL (ref 0.6–1.1)
EGFR: >90 mL/min/{1.73_m2} (ref >90)
Glucose: 89 mg/dl (ref 70–140)
Potassium: 4.4 mEq/L (ref 3.5–5.1)
Sodium: 142 mEq/L (ref 136–145)
TOTAL PROTEIN: 6.8 g/dL (ref 6.4–8.3)

## 2016-02-11 MED ORDER — SODIUM CHLORIDE 0.9 % IV SOLN
Freq: Once | INTRAVENOUS | Status: AC
  Administered 2016-02-11: 12:00:00 via INTRAVENOUS

## 2016-02-11 MED ORDER — SODIUM CHLORIDE 0.9 % IJ SOLN
10.0000 mL | INTRAMUSCULAR | Status: DC | PRN
  Administered 2016-02-11: 10 mL
  Filled 2016-02-11: qty 10

## 2016-02-11 MED ORDER — ZOLEDRONIC ACID 4 MG/100ML IV SOLN
4.0000 mg | Freq: Once | INTRAVENOUS | Status: AC
  Administered 2016-02-11: 4 mg via INTRAVENOUS
  Filled 2016-02-11: qty 100

## 2016-02-11 MED ORDER — HEPARIN SOD (PORK) LOCK FLUSH 100 UNIT/ML IV SOLN
500.0000 [IU] | Freq: Once | INTRAVENOUS | Status: AC | PRN
  Administered 2016-02-11: 500 [IU]
  Filled 2016-02-11: qty 5

## 2016-02-11 NOTE — Progress Notes (Signed)
Patient Care Team: Mackie Pai, PA-C as PCP - General (Physician Assistant) Thea Silversmith, MD as Consulting Physician (Radiation Oncology) Consuela Mimes, MD as Consulting Physician (Hematology and Oncology) Nicholas Lose, MD as Consulting Physician (Hematology and Oncology)  DIAGNOSIS: Breast cancer of lower-outer quadrant of left female breast [facility])   Staging form: Breast, AJCC 7th Edition     Pathologic: Stage IIIC (T4d, N3, cM0) - Signed by [name], MD on 01/19/2014   SUMMARY OF ONCOLOGIC HISTORY:   Breast cancer of lower-outer quadrant of left female breast ([place])   12/07/2010 - 04/12/2011 Neo-Adjuvant Chemotherapy Neoadjuvant FEC x6 followed by Taxotere x4   12/26/2010 Procedure Genetic testing showed mutation for BRCA2 [id number]   05/12/2011 Surgery Bilateral mastectomy with left axillary dissection 11/21 positive lymph nodes 5.8 cm tumor ER 95% PR 0% Ki-67 61% HER-2 negative ratio 1.39 T3, N3, 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   07/03/2011 - 08/17/2011 Radiation Therapy Radiation therapy to the chest and axilla   09/11/2011 -  Anti-estrogen oral therapy Tamoxifen later switched to Arimidex 07/2012   04/22/2012 Surgery Bilateral salpingo-oophorectomy   06/10/2015 Imaging Bone scan: uptake right lateral frontal bone, left malar region, manubrium, right 6,8 ribs; CT-CAP: New extensive infiltrate left & right hilar/mediastinal, right axillary, bil lower neck LN, 3.7 cm LUL consolidation, RML nodules,Lt Pl eff, RPLN   06/18/2015 Initial Biopsy Right axillary lymph node biopsy: 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, ER 90%, PR 0%, Ki-67 60%, HER-2 negative   06/29/2015 - 10/19/2015 Chemotherapy Halaven days 1 and 8 Q 3 weeks 6 cycles   11/01/2015 PET scan Focal patchy LUL opacities new/increased possibly reflecting infection or tumor, mild improving hypermet LN in Rt paratracheal and left infrahilar region, subcut mets to right shoulder and right post gluteal,  ext bone mets   11/08/2015 -  Anti-estrogen oral therapy Ibrance with Letrozole   12/30/2015 - 01/07/2016 Radiation Therapy Palliative radiation to the neck (stopped early due to esophagitis)    CHIEF COMPLIANT: follow-up and Ibrance with letrozole  INTERVAL HISTORY: Tina Vaughan is a 55 year old with above-mentioned history of metastatic breast cancer currently on palliative treatment with Ibrance with letrozole. She appears to be tolerating Ibrance fairly well. She does get nausea in the morning for which she takes Zofran. She takes pain medications intermittently for bone pain. Esophagitis that she developed after the palliative radiation to the back has improved. Her taste buds have not come back. Her shortness of breath is stable.  REVIEW OF SYSTEMS:   Constitutional: Denies fevers, chills or abnormal weight loss Eyes: Denies blurriness of vision Ears, nose, mouth, throat, and face: Denies mucositis or sore throat Respiratory: shortness of breath and fatigue Cardiovascular: Denies palpitation, chest discomfort Gastrointestinal:  Denies nausea, heartburn or change in bowel habits Skin: Denies abnormal skin rashes Lymphatics: Denies new lymphadenopathy or easy bruising Neurological:Denies numbness, tingling or new weaknesses Behavioral/Psych: Mood is stable, no new changes  Extremities: No lower extremity edema Breast:  denies any pain or lumps or nodules in either breasts All other systems were reviewed with the patient and are negative.  I have reviewed the past medical history, past surgical history, social history and family history with the patient and they are unchanged from previous note.  ALLERGIES:  is allergic to doxycycline hydrochloride.  MEDICATIONS:  Current Outpatient Prescriptions  Medication Sig Dispense Refill  . buPROPion (WELLBUTRIN) 75 MG tablet Take 75 mg by mouth 2 (two) times daily.    Marland Kitchen  Calcium 200 MG TABS Take 200 mg by mouth daily.     . cholecalciferol  (VITAMIN D) 1000 UNITS tablet Take 1,000 Units by mouth daily.     . citalopram (CELEXA) 40 MG tablet TAKE 1 TABLET BY MOUTH DAILY. 30 tablet 5  . dexamethasone (DECADRON) 4 MG tablet Take 0.5 tablets (2 mg total) by mouth daily. (Patient not taking: Reported on 12/30/2015) 30 tablet 1  . fluocinonide cream (LIDEX) 0.05 % Apply 1 application topically 2 (two) times daily as needed (for rash.).    Marland Kitchen gabapentin (NEURONTIN) 300 MG capsule Take 1 capsule (300 mg total) by mouth daily. 30 capsule 5  . ibuprofen (ADVIL,MOTRIN) 200 MG tablet Take 600 mg by mouth every 8 (eight) hours as needed for moderate pain (pain).     Marland Kitchen letrozole (FEMARA) 2.5 MG tablet Take 1 tablet (2.5 mg total) by mouth daily. 90 tablet 0  . lidocaine-prilocaine (EMLA) cream Apply to affected area once (Patient taking differently: Apply 1 application topically daily as needed. For port) 30 g 3  . LORazepam (ATIVAN) 1 MG tablet Take 1 mg by mouth every 6 (six) hours as needed (nausea.). Reported on 01/18/2016  0  . magic mouthwash w/lidocaine SOLN Take 5 mLs by mouth 3 (three) times daily as needed for mouth pain. 480 mL 1  . naproxen sodium (ANAPROX) 220 MG tablet Take 220-440 mg by mouth 2 (two) times daily as needed (for muscle aches.).     Marland Kitchen omeprazole (PRILOSEC) 20 MG capsule Take 1 capsule (20 mg total) by mouth daily. Take 2 x  30-60 min before first meal of the day 30 capsule 3  . ondansetron (ZOFRAN) 8 MG tablet TAKE 1 TABLET BY MOUTH 2 TIMES DAILY. START THE DAY AFTER CHEMO FOR 2 DAYS. THEN TAKE AS NEEDED FOR NAUSEA OR VOMITING. 30 tablet 1  . oxyCODONE (ROXICODONE) 15 MG immediate release tablet Take 1 tablet (15 mg total) by mouth every 4 (four) hours as needed for pain. 90 tablet 0  . oxyCODONE-acetaminophen (PERCOCET) 10-325 MG tablet Take 1-2 tablets q 4 hrs PRN pain. (Patient not taking: Reported on 01/18/2016) 30 tablet 0  . palbociclib (IBRANCE) 125 MG capsule Take 1 capsule (125 mg total) by mouth daily with breakfast.  Take whole with food for 21 days, then take 7 days off 21 capsule 2  . ranitidine (ZANTAC) 150 MG tablet 2 at bedtime (Patient taking differently: Take 300 mg by mouth at bedtime. 2 at bedtime)    . sucralfate (CARAFATE) 1 g tablet Dissolve 1 tablet in 10 mL H20 and swallow 30 min prior to meals and bedtime. 30 tablet 5  . UNABLE TO FIND Dispense compression sleeve 20-30 mm for this patient with history of breast cancer s/p surgery 1 each 0  . zolpidem (AMBIEN) 10 MG tablet TAKE 1 TABLET BY MOUTH AT BEDTIME. 30 tablet 2   No current facility-administered medications for this visit.    PHYSICAL EXAMINATION: ECOG PERFORMANCE STATUS: 1 - Symptomatic but completely ambulatory  Filed Vitals:   02/11/16 1101  BP: 120/85  Pulse: 98  Temp: 98.1 F (36.7 C)  Resp: 18   Filed Weights   02/11/16 1101  Weight: 200 lb 12.8 oz (91.082 kg)    GENERAL:alert, no distress and comfortable SKIN: skin color, texture, turgor are normal, no rashes or significant lesions EYES: normal, Conjunctiva are pink and non-injected, sclera clear OROPHARYNX:no exudate, no erythema and lips, buccal mucosa, and tongue normal  NECK: supple, thyroid  normal size, non-tender, without nodularity LYMPH:  no palpable lymphadenopathy in the cervical, axillary or inguinal LUNGS: clear to auscultation and percussion with normal breathing effort HEART: regular rate & rhythm and no murmurs and no lower extremity edema ABDOMEN:abdomen soft, non-tender and normal bowel sounds MUSCULOSKELETAL:no cyanosis of digits and no clubbing  NEURO: alert & oriented x 3 with fluent speech, no focal motor/sensory deficits EXTREMITIES: No lower extremity edema  LABORATORY DATA:  I have reviewed the data as listed   Chemistry      Component Value Date/Time   NA 144 01/18/2016 2014   NA 140 01/13/2016 1003   K 4.2 01/18/2016 2014   K 4.2 01/13/2016 1003   CL 110 01/18/2016 2014   CL 108* 07/30/2012 1430   CO2 22 01/18/2016 2014   CO2  26 01/13/2016 1003   BUN 14 01/18/2016 2014   BUN 10.3 01/13/2016 1003   CREATININE 0.61 01/18/2016 2014   CREATININE 0.8 01/13/2016 1003      Component Value Date/Time   CALCIUM 9.7 01/18/2016 2014   CALCIUM 9.1 01/13/2016 1003   ALKPHOS 47 01/18/2016 2014   ALKPHOS 58 01/13/2016 1003   AST 18 01/18/2016 2014   AST 13 01/13/2016 1003   ALT 16 01/18/2016 2014   ALT 15 01/13/2016 1003   BILITOT 0.9 01/18/2016 2014   BILITOT 0.42 01/13/2016 1003       Lab Results  Component Value Date   WBC 2.3* 02/11/2016   HGB 12.6 02/11/2016   HCT 35.6 02/11/2016   MCV 90.4 02/11/2016   PLT 124* 02/11/2016   NEUTROABS 1.3* 02/11/2016     ASSESSMENT & PLAN:  Breast cancer of lower-outer quadrant of left female breast ([facility]) Right axillary lymph node biopsy 06/18/2015: 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 BRCA 2 mutation underwent salpingo-oophorectomy Status post bilateral mastectomies and radiation to left chest wall and axilla currently on tamoxifen since October 2012) Prior treatment: Halaven day 1 and day 8 every 3 weeks 6 cycles started 06/29/2015 and completed 10/19/2015 PET/CT scan 11/01/2015:Focal patchy LUL opacities new/increased possibly reflecting infection or tumor, mild improving hypermet LN in Rt paratracheal and left infrahilar region, subcut mets to right shoulder and right post gluteal, ext bone  mets ------------------------------------------------------------------------------------------------------------------------------------------------------- Current treatment: letrozole with Ibrance along with Zometa started 11/12/2015  Ibrance toxicities: 1. Peripheral Neuropathy 2. Neutropenia: grade 1 3. Hot flashes related to letrozole  4. Nausea: Takes Zofran when necessary  Shortness of breath: Probably related to malignancy. Her symptoms sent to get worse towards the end of the day. She had seen pulmonology previously. CT scans do not reveal any underlying cause for shortness of  Her blood count was reviewed and ANC is 2.3 . I recommended that she resume Ibrance with letrozole starting today.  Bone metastases: Zometa Q monthly, calcium plus vitamin D, pain from the bone metastases improved with Roxicodone.   Neck pain: Palliative radiation to the neck 02/16/2017only received 5 fractions, patient decided to discontinue Esophagitis: due to radiation. It has resolved  Our plan is to obtain scans  after 2 more months. Patient is planning to go on a road trip to the beach with her friends.  Return to clinic in 4 weeks   No orders of the defined types were placed in this encounter.   The patient has a good understanding of the overall plan. she agrees with it. she will call with any problems that may develop before the next visit here.   Rulon Eisenmenger, MD 02/11/2016

## 2016-02-11 NOTE — Telephone Encounter (Signed)
appt made and avs printed °

## 2016-02-11 NOTE — Progress Notes (Signed)
Unable to get in to exam room prior to MD.  No assessment performed.  

## 2016-02-11 NOTE — Patient Instructions (Signed)

## 2016-02-14 ENCOUNTER — Encounter: Payer: Self-pay | Admitting: Hematology and Oncology

## 2016-02-14 NOTE — Progress Notes (Signed)
disabl form left for patient to pick up 12/2015 I am sending to medical records.

## 2016-02-15 ENCOUNTER — Encounter: Payer: Self-pay | Admitting: Pharmacist

## 2016-02-15 NOTE — Progress Notes (Signed)
Oral Chemotherapy Follow-Up Form  Original Start date of oral chemotherapy: _12/23/17__   Called patient today to follow up regarding patient's oral chemotherapy medication: _Ibrance_  Pt is doing well today. She is currently on her off week and will be restarting on Saturday 4/8 per patient. She will call [employer] to set up delivery of medication. Overall she is doing well. Still some shortness of breath (per Dr. Lindi Adie: likely disease related). No other issues to report. Radiation completed/stopped due to esophagitis - using magic mouth wash  Pt reports __0__ tablets/doses missed in the last month.    Pt reports the following side effects: __shortness of breath (likely not ibrance related)_________    Will follow up and call patient again in __3 weeks____   Thank you,  Montel Clock, PharmD, [facility]

## 2016-02-24 ENCOUNTER — Other Ambulatory Visit: Payer: Medicare Other

## 2016-02-24 ENCOUNTER — Ambulatory Visit: Payer: Medicare Other

## 2016-02-28 ENCOUNTER — Other Ambulatory Visit: Payer: Self-pay | Admitting: Hematology and Oncology

## 2016-03-07 ENCOUNTER — Telehealth: Payer: Self-pay | Admitting: Pharmacist

## 2016-03-07 NOTE — Telephone Encounter (Signed)
Oral Chemotherapy Follow-Up Form  Original Start date of oral chemotherapy: 11/05/2015  Called patient today to follow up regarding patient's oral chemotherapy medication: ibrance  Pt is doing well today.  She is on her 7 days off of Ibrance.  She has not had any problems and has appt with Dr Sharen Heck this week.  Encouraged Tina Vaughan to call if she has any issues with Ibrance.  Pt reports 0 tablets/doses missed in the last week/month.     Will follow up and call patient again in 1 month.  Thank you,  Tina Vaughan, PharmD [facility]otherapy [facility]

## 2016-03-08 ENCOUNTER — Other Ambulatory Visit: Payer: Self-pay

## 2016-03-08 DIAGNOSIS — C50512 Malignant neoplasm of lower-outer quadrant of left female breast: Secondary | ICD-10-CM

## 2016-03-08 MED ORDER — PALBOCICLIB 125 MG PO CAPS: 125.0000 mg | ORAL_CAPSULE | Freq: Every day | ORAL | Status: DC

## 2016-03-09 ENCOUNTER — Encounter: Payer: Self-pay | Admitting: Hematology and Oncology

## 2016-03-09 ENCOUNTER — Ambulatory Visit (HOSPITAL_BASED_OUTPATIENT_CLINIC_OR_DEPARTMENT_OTHER): Payer: Medicare Other | Admitting: Hematology and Oncology

## 2016-03-09 ENCOUNTER — Telehealth: Payer: Self-pay | Admitting: Hematology and Oncology

## 2016-03-09 ENCOUNTER — Other Ambulatory Visit (HOSPITAL_BASED_OUTPATIENT_CLINIC_OR_DEPARTMENT_OTHER): Payer: Medicare Other

## 2016-03-09 VITALS — BP 122/86 | HR 95 | Temp 97.5°F | Resp 18 | Wt 202.7 lb

## 2016-03-09 DIAGNOSIS — C50919 Malignant neoplasm of unspecified site of unspecified female breast: Secondary | ICD-10-CM

## 2016-03-09 DIAGNOSIS — C50512 Malignant neoplasm of lower-outer quadrant of left female breast: Secondary | ICD-10-CM

## 2016-03-09 DIAGNOSIS — C7951 Secondary malignant neoplasm of bone: Secondary | ICD-10-CM | POA: Diagnosis not present

## 2016-03-09 DIAGNOSIS — G4700 Insomnia, unspecified: Secondary | ICD-10-CM

## 2016-03-09 DIAGNOSIS — R479 Unspecified speech disturbances: Secondary | ICD-10-CM | POA: Diagnosis not present

## 2016-03-09 DIAGNOSIS — F411 Generalized anxiety disorder: Secondary | ICD-10-CM | POA: Diagnosis not present

## 2016-03-09 DIAGNOSIS — C773 Secondary and unspecified malignant neoplasm of axilla and upper limb lymph nodes: Secondary | ICD-10-CM

## 2016-03-09 LAB — CBC WITH DIFFERENTIAL/PLATELET
BASO%: 0.8 % (ref 0.0–2.0)
Basophils Absolute: 0 10*3/uL (ref 0.0–0.1)
EOS%: 1.2 % (ref 0.0–7.0)
Eosinophils Absolute: 0 10*3/uL (ref 0.0–0.5)
HCT: 37.4 % (ref 34.8–46.6)
HGB: 12.7 g/dL (ref 11.6–15.9)
LYMPH%: 31.4 % (ref 14.0–49.7)
MCH: 31.9 pg (ref 25.1–34.0)
MCHC: 33.9 g/dL (ref 31.5–36.0)
MCV: 94.2 fL (ref 79.5–101.0)
MONO#: 0.3 10*3/uL (ref 0.1–0.9)
MONO%: 8.8 % (ref 0.0–14.0)
NEUT%: 57.8 % (ref 38.4–76.8)
NEUTROS ABS: 2.2 10*3/uL (ref 1.5–6.5)
PLATELETS: 165 10*3/uL (ref 145–400)
RBC: 3.97 10*6/uL (ref 3.70–5.45)
RDW: 15.8 % — ABNORMAL HIGH (ref 11.2–14.5)
WBC: 3.7 10*3/uL — AB (ref 3.9–10.3)
lymph#: 1.2 10*3/uL (ref 0.9–3.3)

## 2016-03-09 LAB — COMPREHENSIVE METABOLIC PANEL
ALT: 12 U/L (ref 0–55)
ANION GAP: 6 meq/L (ref 3–11)
AST: 10 U/L (ref 5–34)
Albumin: 3.9 g/dL (ref 3.5–5.0)
Alkaline Phosphatase: 38 U/L — ABNORMAL LOW (ref 40–150)
BILIRUBIN TOTAL: 0.56 mg/dL (ref 0.20–1.20)
BUN: 16.5 mg/dL (ref 7.0–26.0)
CHLORIDE: 107 meq/L (ref 98–109)
CO2: 27 meq/L (ref 22–29)
CREATININE: 0.7 mg/dL (ref 0.6–1.1)
Calcium: 9.3 mg/dL (ref 8.4–10.4)
GLUCOSE: 100 mg/dL (ref 70–140)
Potassium: 4.4 mEq/L (ref 3.5–5.1)
SODIUM: 140 meq/L (ref 136–145)
TOTAL PROTEIN: 6.6 g/dL (ref 6.4–8.3)

## 2016-03-09 NOTE — Assessment & Plan Note (Signed)
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 BRCA 2 mutation underwent salpingo-oophorectomy Status post bilateral mastectomies and radiation to left chest wall and axilla currently on tamoxifen since October 2012) Prior treatment: Halaven day 1 and day 8 every 3 weeks 6 cycles started 06/29/2015 and completed 10/19/2015 PET/CT scan 11/01/2015:Focal patchy LUL opacities new/increased possibly reflecting infection or tumor, mild improving hypermet LN in Rt paratracheal and left infrahilar region, subcut mets to right shoulder and right post gluteal, ext bone mets CT angiogram 12/19/2015: No PE slight interval worsening of consolidative opacities within the left upper lobe ------------------------------------------------------------------------------------------------------------------------------------------------------- Current treatment: letrozole with Ibrance along with Zometa started 11/12/2015  Ibrance toxicities: 1. Peripheral Neuropathy 2. Neutropenia: grade 1 3. Hot flashes related to letrozole  4. Nausea: Takes Zofran when necessary  Shortness of breath: Probably related to malignancy. Her symptoms sent to get worse towards the end of the day. She had seen pulmonology previously. CT scans do not reveal any underlying cause for shortness  Her blood count was reviewed and ANC is 2.3 . I recommended that she resume Ibrance with letrozole starting today.  Bone metastases: Zometa Q monthly,  calcium plus vitamin D, pain from the bone metastases improved with Roxicodone.   Neck pain: Palliative radiation to the neck 02/16/2017only received 5 fractions, patient decided to discontinue Esophagitis: due to radiation. It has resolved  Our plan is to obtain scans after 2 more months. Patient is planning to go on a road trip to the beach with her friends.  Return to clinic in 8 weeks with scans

## 2016-03-09 NOTE — Progress Notes (Signed)
Patient Care Team: Mackie Pai, PA-C as PCP - General (Physician Assistant) Thea Silversmith, MD as Consulting Physician (Radiation Oncology) Consuela Mimes, MD as Consulting Physician (Hematology and Oncology) Nicholas Lose, MD as Consulting Physician (Hematology and Oncology)  DIAGNOSIS: Breast cancer of lower-outer quadrant of left female breast [facility])   Staging form: Breast, AJCC 7th Edition     Pathologic: Stage IIIC (T4d, N3, cM0) - Signed by [name], MD on 01/19/2014   SUMMARY OF ONCOLOGIC HISTORY:   Breast cancer of lower-outer quadrant of left female breast ([place])   12/07/2010 - 04/12/2011 Neo-Adjuvant Chemotherapy Neoadjuvant FEC x6 followed by Taxotere x4   12/26/2010 Procedure Genetic testing showed mutation for BRCA2 [id number]   05/12/2011 Surgery Bilateral mastectomy with left axillary dissection 11/21 positive lymph nodes 5.8 cm tumor ER 95% PR 0% Ki-67 61% HER-2 negative ratio 1.39 T3, N3, 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   07/03/2011 - 08/17/2011 Radiation Therapy Radiation therapy to the chest and axilla   09/11/2011 -  Anti-estrogen oral therapy Tamoxifen later switched to Arimidex 07/2012   04/22/2012 Surgery Bilateral salpingo-oophorectomy   06/10/2015 Imaging Bone scan: uptake right lateral frontal bone, left malar region, manubrium, right 6,8 ribs; CT-CAP: New extensive infiltrate left & right hilar/mediastinal, right axillary, bil lower neck LN, 3.7 cm LUL consolidation, RML nodules,Lt Pl eff, RPLN   06/18/2015 Initial Biopsy Right axillary lymph node biopsy: 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, ER 90%, PR 0%, Ki-67 60%, HER-2 negative   06/29/2015 - 10/19/2015 Chemotherapy Halaven days 1 and 8 Q 3 weeks 6 cycles   11/01/2015 PET scan Focal patchy LUL opacities new/increased possibly reflecting infection or tumor, mild improving hypermet LN in Rt paratracheal and left infrahilar region, subcut mets to right shoulder and right post gluteal,  ext bone mets   11/08/2015 -  Anti-estrogen oral therapy Ibrance with Letrozole   12/30/2015 - 01/07/2016 Radiation Therapy Palliative radiation to the neck (stopped early due to esophagitis)    CHIEF COMPLIANT: Difficulty with speech, anxiety, insomnia  INTERVAL HISTORY: Tina Vaughan is a 55 year old with above-mentioned history metastatic breast cancer currently on Ibrance with letrozole. She is having difficulties with speech. In in the past this used to happen intermittently especially when she is stressed out. Currently she is having it constantly. It is significantly bothering her. The shortness of breath has improved significantly. She also has difficulty with sleeping and takes 20 mg of Ambien at bedtime. She takes 2 antidepressants as well.  REVIEW OF SYSTEMS:   Constitutional: Denies fevers, chills or abnormal weight loss Eyes: Denies blurriness of vision Ears, nose, mouth, throat, and face: Denies mucositis or sore throat Respiratory: Denies cough, dyspnea or wheezes Cardiovascular: Denies palpitation, chest discomfort Gastrointestinal:  Denies nausea, heartburn or change in bowel habits Skin: Denies abnormal skin rashes Lymphatics: Denies new lymphadenopathy or easy bruising Neurological: Problems with speech Behavioral/Psych: Anxiety disorder with depression Extremities: No lower extremity edema  All other systems were reviewed with the patient and are negative.  I have reviewed the past medical history, past surgical history, social history and family history with the patient and they are unchanged from previous note.  ALLERGIES:  is allergic to doxycycline hydrochloride.  MEDICATIONS:  Current Outpatient Prescriptions  Medication Sig Dispense Refill  . buPROPion (WELLBUTRIN) 75 MG tablet Take 75 mg by mouth 2 (two) times daily.    . Calcium 200 MG TABS Take 200 mg by mouth daily.     Marland Kitchen  cholecalciferol (VITAMIN D) 1000 UNITS tablet Take 1,000 Units by mouth daily.     .  citalopram (CELEXA) 40 MG tablet TAKE 1 TABLET BY MOUTH DAILY. 30 tablet 5  . dexamethasone (DECADRON) 4 MG tablet Take 0.5 tablets (2 mg total) by mouth daily. (Patient not taking: Reported on 12/30/2015) 30 tablet 1  . fluocinonide cream (LIDEX) 8.36 % Apply 1 application topically 2 (two) times daily as needed (for rash.).    Marland Kitchen gabapentin (NEURONTIN) 300 MG capsule Take 1 capsule (300 mg total) by mouth daily. 30 capsule 5  . ibuprofen (ADVIL,MOTRIN) 200 MG tablet Take 600 mg by mouth every 8 (eight) hours as needed for moderate pain (pain).     Marland Kitchen letrozole (FEMARA) 2.5 MG tablet TAKE 1 TABLET BY MOUTH DAILY. 90 tablet 0  . lidocaine-prilocaine (EMLA) cream Apply to affected area once (Patient taking differently: Apply 1 application topically daily as needed. For port) 30 g 3  . LORazepam (ATIVAN) 1 MG tablet Take 1 mg by mouth every 6 (six) hours as needed (nausea.). Reported on 01/18/2016  0  . magic mouthwash w/lidocaine SOLN Take 5 mLs by mouth 3 (three) times daily as needed for mouth pain. 480 mL 1  . naproxen sodium (ANAPROX) 220 MG tablet Take 220-440 mg by mouth 2 (two) times daily as needed (for muscle aches.).     Marland Kitchen omeprazole (PRILOSEC) 20 MG capsule Take 1 capsule (20 mg total) by mouth daily. Take 2 x  30-60 min before first meal of the day 30 capsule 3  . ondansetron (ZOFRAN) 8 MG tablet TAKE 1 TABLET BY MOUTH 2 TIMES DAILY. START THE DAY AFTER CHEMO FOR 2 DAYS. THEN TAKE AS NEEDED FOR NAUSEA OR VOMITING. 30 tablet 1  . oxyCODONE (ROXICODONE) 15 MG immediate release tablet Take 1 tablet (15 mg total) by mouth every 4 (four) hours as needed for pain. 90 tablet 0  . oxyCODONE-acetaminophen (PERCOCET) 10-325 MG tablet Take 1-2 tablets q 4 hrs PRN pain. (Patient not taking: Reported on 01/18/2016) 30 tablet 0  . palbociclib (IBRANCE) 125 MG capsule Take 1 capsule (125 mg total) by mouth daily with breakfast. Take whole with food for 21 days, then take 7 days off 21 capsule 2  . ranitidine  (ZANTAC) 150 MG tablet 2 at bedtime (Patient taking differently: Take 300 mg by mouth at bedtime. 2 at bedtime)    . sucralfate (CARAFATE) 1 g tablet Dissolve 1 tablet in 10 mL H20 and swallow 30 min prior to meals and bedtime. 30 tablet 5  . UNABLE TO FIND Dispense compression sleeve 20-30 mm for this patient with history of breast cancer s/p surgery 1 each 0  . zolpidem (AMBIEN) 10 MG tablet TAKE 1 TABLET BY MOUTH AT BEDTIME. 30 tablet 2   No current facility-administered medications for this visit.    PHYSICAL EXAMINATION: ECOG PERFORMANCE STATUS: 1 - Symptomatic but completely ambulatory  Filed Vitals:   03/09/16 1106  BP: 122/86  Pulse: 95  Temp: 97.5 F (36.4 C)  Resp: 18   Filed Weights   03/09/16 1106  Weight: 202 lb 11.2 oz (91.944 kg)    GENERAL:alert, no distress and comfortable SKIN: skin color, texture, turgor are normal, no rashes or significant lesions EYES: normal, Conjunctiva are pink and non-injected, sclera clear OROPHARYNX:no exudate, no erythema and lips, buccal mucosa, and tongue normal  NECK: supple, thyroid normal size, non-tender, without nodularity LYMPH:  no palpable lymphadenopathy in the cervical, axillary or inguinal LUNGS: clear  to auscultation and percussion with normal breathing effort HEART: regular rate & rhythm and no murmurs and no lower extremity edema ABDOMEN:abdomen soft, non-tender and normal bowel sounds MUSCULOSKELETAL:no cyanosis of digits and no clubbing  NEURO: Speech impairment with anxiety and depression EXTREMITIES: No lower extremity edema   LABORATORY DATA:  I have reviewed the data as listed   Chemistry      Component Value Date/Time   NA 142 02/11/2016 1047   NA 144 01/18/2016 2014   K 4.4 02/11/2016 1047   K 4.2 01/18/2016 2014   CL 110 01/18/2016 2014   CL 108* 07/30/2012 1430   CO2 28 02/11/2016 1047   CO2 22 01/18/2016 2014   BUN 8.0 02/11/2016 1047   BUN 14 01/18/2016 2014   CREATININE 0.7 02/11/2016 1047     CREATININE 0.61 01/18/2016 2014      Component Value Date/Time   CALCIUM 9.3 02/11/2016 1047   CALCIUM 9.7 01/18/2016 2014   ALKPHOS 39* 02/11/2016 1047   ALKPHOS 47 01/18/2016 2014   AST 12 02/11/2016 1047   AST 18 01/18/2016 2014   ALT 16 02/11/2016 1047   ALT 16 01/18/2016 2014   BILITOT 0.48 02/11/2016 1047   BILITOT 0.9 01/18/2016 2014       Lab Results  Component Value Date   WBC 3.7* 03/09/2016   HGB 12.7 03/09/2016   HCT 37.4 03/09/2016   MCV 94.2 03/09/2016   PLT 165 03/09/2016   NEUTROABS 2.2 03/09/2016     ASSESSMENT & PLAN:  Breast cancer of lower-outer quadrant of left female breast ([place]) Right axillary lymph node biopsy 06/18/2015: 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 BRCA 2 mutation underwent salpingo-oophorectomy Status post bilateral mastectomies and radiation to left chest wall and axilla currently on tamoxifen since October 2012) Prior treatment: Halaven day 1 and day 8 every 3 weeks 6 cycles started 06/29/2015 and completed 10/19/2015 PET/CT scan 11/01/2015:Focal patchy LUL opacities new/increased possibly reflecting infection or tumor, mild improving hypermet LN in Rt paratracheal and left infrahilar region, subcut mets to right shoulder and right post gluteal, ext bone mets CT angiogram 12/19/2015: No PE slight interval worsening of consolidative opacities within the left upper  lobe ------------------------------------------------------------------------------------------------------------------------------------------------------- Current treatment: letrozole with Ibrance along with Zometa started 11/12/2015  Ibrance toxicities: 1. Peripheral Neuropathy 2. Neutropenia: grade 1: Did not require dose reduction. 3. Hot flashes related to letrozole  4. Nausea: Takes Zofran when necessary  Shortness of breath:  CT scans do not reveal any underlying cause for shortness, it appears to have improved significantly Speech difficulties: I will refer her to a speech pathologist. I would also like to obtain a brain MRI. Severe anxiety and insomnia: I will referred her to a psychiatrist.  Her blood count was reviewed and ANC is 2.3 . I recommended that she continue new cycle of Ibrance with letrozole starting today.  Bone metastases: Zometa Q monthly, calcium plus vitamin D, pain from the bone metastases improved with Roxicodone.   Neck pain: Palliative radiation to the neck 02/16/2017only received 5 fractions, patient  decided to discontinue Esophagitis: due to radiation. It has resolved  Our plan is to obtain scans after 2 more months.  Return to clinic in 8 weeks with scans.   No orders of the defined types were placed in this encounter.   The patient has a good understanding of the overall plan. she agrees with it. she will call with any problems that may develop before the next visit here.   Rulon Eisenmenger, MD 03/09/2016

## 2016-03-09 NOTE — Telephone Encounter (Signed)
appt made and avs printed. Imaging to be sch by central radiology

## 2016-03-09 NOTE — Progress Notes (Signed)
Unable to get in to exam room prior to MD.  No assessment performed.  

## 2016-03-10 ENCOUNTER — Encounter: Payer: Self-pay | Admitting: *Deleted

## 2016-03-10 ENCOUNTER — Ambulatory Visit (HOSPITAL_BASED_OUTPATIENT_CLINIC_OR_DEPARTMENT_OTHER): Payer: Medicare Other

## 2016-03-10 ENCOUNTER — Other Ambulatory Visit: Payer: Self-pay

## 2016-03-10 VITALS — BP 124/88 | HR 94 | Temp 98.4°F | Resp 18

## 2016-03-10 DIAGNOSIS — C50512 Malignant neoplasm of lower-outer quadrant of left female breast: Secondary | ICD-10-CM | POA: Diagnosis not present

## 2016-03-10 DIAGNOSIS — C773 Secondary and unspecified malignant neoplasm of axilla and upper limb lymph nodes: Secondary | ICD-10-CM | POA: Diagnosis not present

## 2016-03-10 DIAGNOSIS — C7951 Secondary malignant neoplasm of bone: Secondary | ICD-10-CM | POA: Diagnosis present

## 2016-03-10 MED ORDER — HEPARIN SOD (PORK) LOCK FLUSH 100 UNIT/ML IV SOLN
500.0000 [IU] | Freq: Once | INTRAVENOUS | Status: AC | PRN
  Administered 2016-03-10: 500 [IU]
  Filled 2016-03-10: qty 5

## 2016-03-10 MED ORDER — SODIUM CHLORIDE 0.9 % IV SOLN
Freq: Once | INTRAVENOUS | Status: AC
  Administered 2016-03-10: 13:00:00 via INTRAVENOUS

## 2016-03-10 MED ORDER — ZOLEDRONIC ACID 4 MG/100ML IV SOLN
4.0000 mg | Freq: Once | INTRAVENOUS | Status: AC
  Administered 2016-03-10: 4 mg via INTRAVENOUS
  Filled 2016-03-10: qty 100

## 2016-03-10 MED ORDER — SODIUM CHLORIDE 0.9 % IJ SOLN
10.0000 mL | INTRAMUSCULAR | Status: DC | PRN
  Administered 2016-03-10: 10 mL
  Filled 2016-03-10: qty 10

## 2016-03-10 NOTE — Progress Notes (Signed)
[place] Work  Clinical Social Work met with pt in lobby to check in and for assessment of psychosocial needs.  Clinical Social Worker met with patient yesterday at [facility] group that pt has started attending. Pt tearful today and CSW provided supportive listening. CSW to follow and continue to support pt.   Clinical Social Work interventions: Supportive listening  Loren Racer, [place] Worker [place]  [place] Phone: [phone] Fax: [phone]

## 2016-03-10 NOTE — Patient Instructions (Signed)

## 2016-03-14 ENCOUNTER — Other Ambulatory Visit: Payer: Self-pay | Admitting: *Deleted

## 2016-03-15 ENCOUNTER — Telehealth: Payer: Self-pay | Admitting: *Deleted

## 2016-03-15 NOTE — Telephone Encounter (Signed)
Faxed office notes/labs to Dr. Louretta Shorten at [facility]. Office to call her for appt.

## 2016-03-16 ENCOUNTER — Ambulatory Visit (HOSPITAL_COMMUNITY)
Admission: RE | Admit: 2016-03-16 | Discharge: 2016-03-16 | Disposition: A | Discharge status: Home / Self Care | Payer: Medicare Other | Source: Ambulatory Visit | Attending: Hematology and Oncology | Admitting: Hematology and Oncology

## 2016-03-16 ENCOUNTER — Other Ambulatory Visit: Payer: Self-pay

## 2016-03-16 DIAGNOSIS — C50512 Malignant neoplasm of lower-outer quadrant of left female breast: Secondary | ICD-10-CM | POA: Diagnosis not present

## 2016-03-16 DIAGNOSIS — R9089 Other abnormal findings on diagnostic imaging of central nervous system: Secondary | ICD-10-CM | POA: Insufficient documentation

## 2016-03-16 DIAGNOSIS — C50919 Malignant neoplasm of unspecified site of unspecified female breast: Secondary | ICD-10-CM | POA: Diagnosis not present

## 2016-03-16 DIAGNOSIS — C7951 Secondary malignant neoplasm of bone: Secondary | ICD-10-CM | POA: Insufficient documentation

## 2016-03-16 DIAGNOSIS — R4702 Dysphasia: Secondary | ICD-10-CM | POA: Diagnosis not present

## 2016-03-16 MED ORDER — GADOBENATE DIMEGLUMINE 529 MG/ML IV SOLN
20.0000 mL | Freq: Once | INTRAVENOUS | Status: AC | PRN
  Administered 2016-03-16: 18 mL via INTRAVENOUS

## 2016-03-17 ENCOUNTER — Other Ambulatory Visit: Payer: Self-pay | Admitting: Hematology and Oncology

## 2016-03-17 DIAGNOSIS — R479 Unspecified speech disturbances: Secondary | ICD-10-CM

## 2016-03-22 ENCOUNTER — Other Ambulatory Visit: Payer: Self-pay | Admitting: Hematology and Oncology

## 2016-03-22 DIAGNOSIS — F332 Major depressive disorder, recurrent severe without psychotic features: Secondary | ICD-10-CM | POA: Diagnosis not present

## 2016-03-22 DIAGNOSIS — F411 Generalized anxiety disorder: Secondary | ICD-10-CM | POA: Diagnosis not present

## 2016-03-22 NOTE — Progress Notes (Addendum)
Mrs. is here for a one month for follow up visit for breast cancer of lower-outer quadrant of left female breast.  Skin status:Normal color to left breast Lotion being used: Not using lotion with vitamin E Have you seen your medical oncologist? Date If not ,when is appointment Dr. Lindi Adie 04-14-16 ER+,have started AI or Tamoxifen? If not, why? Letrozole Discuss survivorship appointment:   [facility] no appointment scheduled Offer referral reading material for Survivorship, [employer] and [facility] given   Appetite:Good today,has improved Pain:2/10 taking Oxycodone  Arm mobility:Able to raise left arm without discomfort Energy level:Having fatigue most of the day. BP 130/85 mmHg  Pulse 123  Temp(Src) 97.9 F (36.6 C) (Oral)  Resp 16  Ht 5\' 2"  (1.575 m)  Wt 200 lb 11.2 oz (91.037 kg)  BMI 36.70 kg/m2  SpO2 98%

## 2016-03-23 ENCOUNTER — Ambulatory Visit
Admission: RE | Admit: 2016-03-23 | Discharge: 2016-03-23 | Disposition: A | Discharge status: Home / Self Care | Payer: Medicare Other | Source: Ambulatory Visit | Attending: Radiation Oncology | Admitting: Radiation Oncology

## 2016-03-23 ENCOUNTER — Other Ambulatory Visit: Payer: Self-pay

## 2016-03-23 ENCOUNTER — Encounter: Payer: Self-pay | Admitting: Radiation Oncology

## 2016-03-23 VITALS — BP 130/85 | HR 123 | Temp 97.9°F | Resp 16 | Ht 62.0 in | Wt 200.7 lb

## 2016-03-23 DIAGNOSIS — C7951 Secondary malignant neoplasm of bone: Secondary | ICD-10-CM

## 2016-03-23 DIAGNOSIS — C50512 Malignant neoplasm of lower-outer quadrant of left female breast: Secondary | ICD-10-CM

## 2016-03-23 MED ORDER — OXYCODONE HCL 15 MG PO TABS: 15.0000 mg | ORAL_TABLET | ORAL | Status: DC | PRN

## 2016-03-23 MED ORDER — ZOLPIDEM TARTRATE 10 MG PO TABS: 10.0000 mg | ORAL_TABLET | Freq: Every day | ORAL | Status: DC

## 2016-03-23 NOTE — Telephone Encounter (Signed)
Spoke with patient regarding her request for Oxycodone and Ambien prescriptions.  I discussed with her that it would be early for the Ambien refill but I could have the dispense date entered and that way she would have the prescription when she ran out of the medication.  Pt in agreement and I will have both of these available for pick up for her appointment today.  No further questions from patient at time of call.

## 2016-03-23 NOTE — Progress Notes (Signed)
Department of Radiation Oncology  Phone:  [phone] Fax:        [phone]   Name: Tina Vaughan MRN: [id number]  DOB: October 15, 1961  Date: 03/23/2016  Follow Up Visit Note  Diagnosis: Breast cancer of lower-outer quadrant of left female breast [facility])   Staging form: Breast, AJCC 7th Edition     Pathologic: Stage IIIC (T4d, N3, cM0) - Signed by [name], MD on 01/19/2014   Summary and Interval since last radiation: 3 months  12/30/2015-01/07/2016: C1-C7 to 21 Gy in 7 fractions at 3 Gy per fraction (stopped early due to esophagitis)   07/03/1011-08/17/2011: Left chest wall and axilla  Interval History: Tina Vaughan presents today for routine followup. She is continuing systemic treatment under Dr. Lindi Adie with Femara, Imbrance, and Zometa. The patient has difficulty with speech and therefore a brain MRI was ordered. 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 It was reviewed in brain tumor conference. The recommendation was to repeat the scan with a 3T MRI in 4-6 weeks. She is here to discuss the results of her recent MRI. She complains of constipation and is taking Miralax and suppositories for this.  Current Outpatient Prescriptions on File Prior to Encounter  Medication Sig Dispense Refill  . Calcium 200 MG TABS Take 200 mg by mouth daily.     . cholecalciferol (VITAMIN D) 1000 UNITS tablet Take 1,000 Units by mouth daily.     . citalopram (CELEXA) 40 MG tablet TAKE 1 TABLET BY MOUTH DAILY. 30 tablet 5  . fluocinonide cream (LIDEX) [id number] % Apply 1 application topically 2 (two) times daily as needed (for rash.).    Marland Kitchen ibuprofen (ADVIL,MOTRIN) 200 MG tablet Take 600 mg by mouth every 8 (eight) hours as needed for moderate pain (pain).     Marland Kitchen letrozole (FEMARA) 2.5 MG tablet TAKE 1 TABLET BY MOUTH DAILY. 90 tablet 0  . lidocaine-prilocaine (EMLA) cream Apply to affected area once (Patient taking differently: Apply 1 application topically daily as needed. For port)  30 g 3  . omeprazole (PRILOSEC) 20 MG capsule Take 1 capsule (20 mg total) by mouth daily. Take 2 x  30-60 min before first meal of the day 30 capsule 3  . ondansetron (ZOFRAN) 8 MG tablet TAKE 1 TABLET BY MOUTH 2 TIMES DAILY. START THE DAY AFTER CHEMO FOR 2 DAYS. THEN TAKE AS NEEDED FOR NAUSEA OR VOMITING. 30 tablet 1  . oxyCODONE-acetaminophen (PERCOCET) 10-325 MG tablet Take 1-2 tablets q 4 hrs PRN pain. 30 tablet 0  . palbociclib (IBRANCE) 125 MG capsule Take 1 capsule (125 mg total) by mouth daily with breakfast. Take whole with food for 21 days, then take 7 days off 21 capsule 2  . dexamethasone (DECADRON) 4 MG tablet Take 0.5 tablets (2 mg total) by mouth daily. (Patient not taking: Reported on 12/30/2015) 30 tablet 1  . gabapentin (NEURONTIN) 300 MG capsule Take 1 capsule (300 mg total) by mouth daily. (Patient taking differently: Take 300 mg by mouth 3 (three) times daily. ) 30 capsule 5  . naproxen sodium (ANAPROX) 220 MG tablet Take 220-440 mg by mouth 2 (two) times daily as needed (for muscle aches.). Reported on 03/23/2016    . ranitidine (ZANTAC) 150 MG tablet 2 at bedtime (Patient not taking: Reported on 03/23/2016)    . sucralfate (CARAFATE) 1 g tablet Dissolve 1 tablet in 10 mL H20 and swallow 30 min prior to meals and bedtime. (Patient not taking:  Reported on 03/23/2016) 30 tablet 5  . UNABLE TO FIND Dispense compression sleeve 20-30 mm for this patient with history of breast cancer s/p surgery (Patient not taking: Reported on 03/23/2016) 1 each 0   No current facility-administered medications on file prior to encounter.    Physical Exam:  Filed Vitals:   03/23/16 1559  BP: 130/85  Pulse: 123  Temp: 97.9 F (36.6 C)  TempSrc: Oral  Resp: 16  Height: 5\' 2"  (1.575 m)  Weight: 200 lb 11.2 oz (91.037 kg)  SpO2: 98%  Alert and oriented x3. The patient was teary-eyed during the encounter. No cranial nerve deficits.  IMPRESSION: Tina Vaughan is a 55 y.o. female with metastatic left breast  cancer.  PLAN: The patient has been scheduled to see speech pathology on 03/27/16. A 3T MRI will be scheduled 4-6 weeks from now. The patient was also curious about acupuncture to help with her pain. I advised her that it would not hurt to try. I referred her to [facility] in [place], [place] at her request. She will continue her other therapies.  We went over her images together.   Thea Silversmith, MD  This document serves as a record of services personally performed by Thea Silversmith, MD. It was created on her behalf by Darcus Austin, a trained medical scribe. The creation of this record is based on the scribe's personal observations and the provider's statements to them. This document has been checked and approved by the attending provider.

## 2016-03-27 ENCOUNTER — Ambulatory Visit: Payer: Medicare Other | Attending: Hematology and Oncology

## 2016-03-27 ENCOUNTER — Ambulatory Visit: Payer: Medicare Other

## 2016-03-27 DIAGNOSIS — R471 Dysarthria and anarthria: Secondary | ICD-10-CM | POA: Diagnosis not present

## 2016-03-27 DIAGNOSIS — R498 Other voice and resonance disorders: Secondary | ICD-10-CM | POA: Diagnosis not present

## 2016-03-27 NOTE — Patient Instructions (Signed)
This appears likely psychological in nature due to the only time the "jarring" speech is heard is when you are talking.   I recommend continued regular sessions with your hospice counselor and your psychiatrist.

## 2016-03-27 NOTE — Therapy (Signed)
[facility] [address] [place], [place], [address] Phone: [phone]   Fax:  [phone]  Speech Language Pathology Evaluation  Patient Details  Name: Tina Vaughan MRN: [id number] Date of Birth: 1961/07/27 Referring Provider: Dr. Lindi Adie  Encounter Date: 03/27/2016      End of Session - 03/27/16 1621    Visit Number 1   Number of Visits 1   Date for SLP Re-Evaluation 03/27/16   SLP Start Time 3710   SLP Stop Time  1400   SLP Time Calculation (min) 37 min   Activity Tolerance Patient tolerated treatment well      Past Medical History  Diagnosis Date  . Frequent urination   . Depression   . Sleep apnea   . Eczema   . Neuromuscular disorder ([facility])   . GERD (gastroesophageal reflux disease)   . 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 Lymphedema   . Anxiety   . Thyroid disease     Possible would like to be evaluated  . Headache   . Obesity   . Breast cancer, stage 3 ([place] [facility]) 2012    left; inflammatory    Past Surgical History  Procedure Laterality Date  . Mastectomy Bilateral 05/12/11    total mastectomy on right, radical mastectomy on left  . Portacath placement    . Laparoscopy  03/14/2012    Procedure: LAPAROSCOPY OPERATIVE;  Surgeon: Osborne Oman, MD;  Location: [facility];  Service: Gynecology;  Laterality: N/A;  . Salpingoophorectomy  03/14/2012    Procedure: SALPINGO OOPHERECTOMY;  Surgeon: Osborne Oman, MD;  Location: [facility];  Service: Gynecology;  Laterality: Bilateral;  . Port-a-cath removal  04/19/2012    Procedure: MINOR REMOVAL PORT-A-CATH;  Surgeon: Haywood Lasso, MD;  Location: [facility];  Service: General;  Laterality: N/A;  . Oophorectomy    . Portacath      Placement     There were no vitals filed for this visit.      Subjective Assessment - 03/27/16 1328    Subjective "I've had (the speech problems) ever since my (cancer) diagnosis." "I find  that when I'm more stressed, it (the speech problem) is much worse."   Patient is accompained by: --  friend            SLP Evaluation [facility] - 03/27/16 1333    SLP Visit Information   SLP Received On 03/27/16   Referring Provider Dr. Lindi Adie   Onset Date August 2016   Medical Diagnosis Breast CA mets    General Information   HPI Rapid, spontaneous onset. No pulmonary testing or brain imaging that would indicate reasoning for disordered speech  Pt currently taking oral chemo, and monthly chemo for bone cancer. Pt attends hospice support group and sees a hospice counselor. Recently saw a psychiatrist.   Prior Functional Status   Cognitive/Linguistic Baseline Within functional limits   Auditory Comprehension   Overall Auditory Comprehension Appears within functional limits for tasks assessed   Verbal Expression   Overall Verbal Expression Appears within functional limits for tasks assessed   Oral Motor/Sensory Function   Overall Oral Motor/Sensory Function --   Labial ROM Within Functional Limits   Labial Symmetry Within Functional Limits   Labial Strength Within Functional Limits   Labial Coordination WFL   Lingual ROM Within Functional Limits   Lingual Symmetry Within Functional Limits   Lingual Strength Within Functional Limits  Lingual Coordination WFL   Facial ROM Within Functional Limits   Facial Symmetry Within Functional Limits   Velum Within Functional Limits   Overall Oral Motor/Sensory Function --   Motor Speech   Overall Motor Speech Impaired   Respiration Impaired   Level of Impairment Word   Phonation Normal   Resonance Within functional limits   Articulation Within functional limitis   Intelligibility Intelligible   Motor Planning Witnin functional limits   Interfering Components --  Undergoing stressful medical situation       Pt with what appears to be inappropriate diaphragmatic thrusting/contraction during speech production, making pt's speech sound  somewhat "jarring" in nature. This occurred across conversational topics, but was inconsistent in the severity of thrusting/contractng throughout the evaluation.  SLP differentially diagnosed by having pt sing, hum, and recite rote selections (pledge of allegiance, ABCs, counting 1-21) and in each trial of non-speech voice use or in rote speech use, pt's speech sounded WNL, without diaphragmatic tremor/thrusting.   Pt's respirations at rest also did not exhibit the same diaphragmatic "thrusting" as in speech output.                  SLP Education - Apr 04, 2016 1621    Education provided Yes   Education Details results of testing   Person(s) Educated Patient;Caregiver(s)  Tina Vaughan, friend   Methods Explanation   Comprehension Verbalized understanding              Plan - 04/04/2016 1622    Clinical Impression Statement Pt's deficits with dysarthria in her verbal communication appear likely psychologic in nature due to WNL diaphragm movement in all verbal non-speech or rote speech tasks. Additionally, abdominal thrusting was NOT observed with pt's respiration at rest. Pt will not require formal ST for this, but regular follow up with her hospice counselor and psychiatrist are recommended.    Speech Therapy Frequency One time visit   Consulted and Agree with Plan of Care Patient      Patient will benefit from skilled therapeutic intervention in order to improve the following deficits and impairments:   Dysarthria and anarthria      G-Codes - 04/04/2016 1625    Functional Limitations Motor speech   Motor Speech Current Status [phone]) At least 20 percent but less than 40 percent impaired, limited or restricted   Motor Speech Goal Status (Y4297) At least 20 percent but less than 40 percent impaired, limited or restricted   Motor Speech Goal Status (O8368) At least 20 percent but less than 40 percent impaired, limited or restricted   Voice Current Status (Y1511) --   Voice Goal  Status (L9576) --   Voice Discharge Status (R8556) --      Problem List Patient Active Problem List   Diagnosis Date Noted  . Speech impairment 03/09/2016  . Encounter for chemotherapy management 11/10/2015  . Bone metastasis ([facility]) 11/02/2015  . Morbid obesity ([facility]) 08/28/2015  . Shortness of breath 08/04/2015  . Dyspnea 08/04/2015  . Tachycardia 08/04/2015  . Hyperventilation 08/04/2015  . Respiratory rate increased 08/04/2015  . Chemotherapy induced nausea and vomiting 07/13/2015  . Nausea without vomiting 06/08/2015  . Special screening for malignant neoplasms, colon 06/08/2015  . Bloating 06/08/2015  . Early satiety 06/08/2015  . Pain in the abdomen 06/04/2015  . Generalized anxiety disorder 05/21/2015  . Fatigue 05/21/2015  . Sleep apnea 05/21/2015  . Headache 10/22/2014  . Lymphedema of upper extremity 09/11/2014  . Status post prophylactic bilateral salpingo-oophorectomy for BRCA  03/19/2012  . Breast cancer of lower-outer quadrant of left female breast ([place]) 05/18/2011    [facility] ,[place], [place]  03/27/2016, 4:30 PM  [facility] [address] [place], [place], [address] Phone: [phone]   Fax:  [phone]  Name: Tina Vaughan MRN: [id number] Date of Birth: 1961-05-18

## 2016-04-03 ENCOUNTER — Other Ambulatory Visit: Payer: Self-pay | Admitting: Hematology and Oncology

## 2016-04-03 DIAGNOSIS — C50512 Malignant neoplasm of lower-outer quadrant of left female breast: Secondary | ICD-10-CM

## 2016-04-07 ENCOUNTER — Other Ambulatory Visit (HOSPITAL_BASED_OUTPATIENT_CLINIC_OR_DEPARTMENT_OTHER): Payer: Medicare Other

## 2016-04-07 ENCOUNTER — Other Ambulatory Visit: Payer: Self-pay | Admitting: Radiation Therapy

## 2016-04-07 ENCOUNTER — Telehealth: Payer: Self-pay

## 2016-04-07 ENCOUNTER — Ambulatory Visit (HOSPITAL_BASED_OUTPATIENT_CLINIC_OR_DEPARTMENT_OTHER): Payer: Medicare Other

## 2016-04-07 ENCOUNTER — Encounter: Payer: Self-pay | Admitting: Radiation Therapy

## 2016-04-07 VITALS — BP 116/72 | HR 100 | Temp 98.4°F | Resp 18

## 2016-04-07 DIAGNOSIS — C773 Secondary and unspecified malignant neoplasm of axilla and upper limb lymph nodes: Secondary | ICD-10-CM | POA: Diagnosis not present

## 2016-04-07 DIAGNOSIS — C7951 Secondary malignant neoplasm of bone: Secondary | ICD-10-CM

## 2016-04-07 DIAGNOSIS — C50919 Malignant neoplasm of unspecified site of unspecified female breast: Secondary | ICD-10-CM

## 2016-04-07 DIAGNOSIS — C50512 Malignant neoplasm of lower-outer quadrant of left female breast: Secondary | ICD-10-CM

## 2016-04-07 LAB — COMPREHENSIVE METABOLIC PANEL
AST: 7 U/L (ref 5–34)
Albumin: 3.9 g/dL (ref 3.5–5.0)
Alkaline Phosphatase: 45 U/L (ref 40–150)
Anion Gap: 10 mEq/L (ref 3–11)
BUN: 13.2 mg/dL (ref 7.0–26.0)
CHLORIDE: 107 meq/L (ref 98–109)
CO2: 24 meq/L (ref 22–29)
CREATININE: 0.8 mg/dL (ref 0.6–1.1)
Calcium: 9.1 mg/dL (ref 8.4–10.4)
EGFR: 80 mL/min/{1.73_m2} — ABNORMAL LOW (ref >90)
GLUCOSE: 113 mg/dL (ref 70–140)
Potassium: 3.7 mEq/L (ref 3.5–5.1)
SODIUM: 141 meq/L (ref 136–145)
TOTAL PROTEIN: 6.8 g/dL (ref 6.4–8.3)
Total Bilirubin: 0.38 mg/dL (ref 0.20–1.20)

## 2016-04-07 LAB — CBC WITH DIFFERENTIAL/PLATELET
BASO%: 0.8 % (ref 0.0–2.0)
Basophils Absolute: 0 10*3/uL (ref 0.0–0.1)
EOS%: 1.5 % (ref 0.0–7.0)
Eosinophils Absolute: 0.1 10*3/uL (ref 0.0–0.5)
HCT: 34.8 % (ref 34.8–46.6)
HGB: 12.2 g/dL (ref 11.6–15.9)
LYMPH%: 35.1 % (ref 14.0–49.7)
MCH: 33.5 pg (ref 25.1–34.0)
MCHC: 35.1 g/dL (ref 31.5–36.0)
MCV: 95.4 fL (ref 79.5–101.0)
MONO#: 0.2 10*3/uL (ref 0.1–0.9)
MONO%: 6.8 % (ref 0.0–14.0)
NEUT%: 55.8 % (ref 38.4–76.8)
NEUTROS ABS: 2 10*3/uL (ref 1.5–6.5)
Platelets: 165 10*3/uL (ref 145–400)
RBC: 3.65 10*6/uL — AB (ref 3.70–5.45)
RDW: 16.1 % — ABNORMAL HIGH (ref 11.2–14.5)
WBC: 3.6 10*3/uL — ABNORMAL LOW (ref 3.9–10.3)
lymph#: 1.3 10*3/uL (ref 0.9–3.3)

## 2016-04-07 MED ORDER — ZOLEDRONIC ACID 4 MG/100ML IV SOLN
4.0000 mg | Freq: Once | INTRAVENOUS | Status: AC
  Administered 2016-04-07: 4 mg via INTRAVENOUS
  Filled 2016-04-07: qty 100

## 2016-04-07 MED ORDER — SODIUM CHLORIDE 0.9 % IJ SOLN
10.0000 mL | INTRAMUSCULAR | Status: DC | PRN
  Administered 2016-04-07: 10 mL via INTRAVENOUS
  Filled 2016-04-07: qty 10

## 2016-04-07 MED ORDER — HEPARIN SOD (PORK) LOCK FLUSH 100 UNIT/ML IV SOLN
500.0000 [IU] | Freq: Once | INTRAVENOUS | Status: AC | PRN
  Administered 2016-04-07: 500 [IU] via INTRAVENOUS
  Filled 2016-04-07: qty 5

## 2016-04-07 MED ORDER — SODIUM CHLORIDE 0.9 % IV SOLN
Freq: Once | INTRAVENOUS | Status: AC
  Administered 2016-04-07: 11:00:00 via INTRAVENOUS

## 2016-04-07 NOTE — Patient Instructions (Signed)

## 2016-04-07 NOTE — Progress Notes (Addendum)
1.  Do you need a wheel chair?    no  2. On oxygen? no  3. Have you ever had any surgery in the body part being scanned?  no  4. Have you ever had any surgery on your brain or heart?  no                        5. Have you ever had surgery on your eyes or ears?    no                               6. Do you have a pacemaker or defibrillator?   no  7. Do you have a Neurostimulator?     no  8. Claustrophobic?  no  9. Any risk for metal in eyes?  no  10. Injury by bullet, buckshot, or shrapnel?  no  11. Stent?   no                                                                                                            12. Hx of Cancer?       Yes, Breast cancer with mets to the bone and suspected brain met.                                                                                                       13. Kidney or Liver disease?   no  14. Hx of Lupus, Rheumatoid Arthritis or Scleroderma? no  15. IV Antibiotics or long term use of NSAIDS?   no  16. HX of Hypertension?  no  17. Diabetes?  no  18. Allergy to contrast?  no  19. Recent labs. To be drawn today, 5/26. Results will be in [facility]  Questions answered over the phone by pt.  Mont Dutton R.T.(R)(T) Special Procedures Navigator  [phone]

## 2016-04-07 NOTE — Telephone Encounter (Signed)
Oral Chemotherapy Follow-Up Form  Original Start date of oral chemotherapy: _12/23/16______   Called patient today to follow up regarding patient's oral chemotherapy medication: ___Ibrance_  Pt is doing well today and reports no adverse events or any other issues.  Patient very pleasant and resting well in the IV room awaiting her infusion of Zometa.  Patient was given our oral oncology navigation clinic business card and we visited about her calling us at anytime if she has any questions.   Pt reports __0__ tablets/doses missed in the last week/month.  Missed dose(s) attributed to: ____________  Pt reports the following side effects: __none reported_________  Other Issues: _________   Will follow up and call patient again in __1 month_______   Thank you,  Henreitta Leber, PharmD Oral Oncology [facility]

## 2016-04-19 DIAGNOSIS — F332 Major depressive disorder, recurrent severe without psychotic features: Secondary | ICD-10-CM | POA: Diagnosis not present

## 2016-04-19 DIAGNOSIS — F411 Generalized anxiety disorder: Secondary | ICD-10-CM | POA: Diagnosis not present

## 2016-04-21 ENCOUNTER — Ambulatory Visit
Admission: RE | Admit: 2016-04-21 | Discharge: 2016-04-21 | Disposition: A | Discharge status: Home / Self Care | Payer: Medicare Other | Source: Ambulatory Visit | Attending: Radiation Oncology | Admitting: Radiation Oncology

## 2016-04-21 DIAGNOSIS — C50919 Malignant neoplasm of unspecified site of unspecified female breast: Secondary | ICD-10-CM

## 2016-04-21 DIAGNOSIS — G9389 Other specified disorders of brain: Secondary | ICD-10-CM | POA: Diagnosis not present

## 2016-04-21 MED ORDER — GADOBENATE DIMEGLUMINE 529 MG/ML IV SOLN
19.0000 mL | Freq: Once | INTRAVENOUS | Status: AC | PRN
  Administered 2016-04-21: 19 mL via INTRAVENOUS

## 2016-04-24 ENCOUNTER — Other Ambulatory Visit: Payer: Self-pay | Admitting: Radiation Therapy

## 2016-04-24 DIAGNOSIS — C7931 Secondary malignant neoplasm of brain: Secondary | ICD-10-CM

## 2016-04-24 DIAGNOSIS — C7949 Secondary malignant neoplasm of other parts of nervous system: Principal | ICD-10-CM

## 2016-04-27 ENCOUNTER — Ambulatory Visit
Admission: RE | Admit: 2016-04-27 | Discharge: 2016-04-27 | Disposition: A | Discharge status: Home / Self Care | Payer: Medicare Other | Source: Ambulatory Visit | Attending: Radiation Oncology | Admitting: Radiation Oncology

## 2016-04-27 ENCOUNTER — Inpatient Hospital Stay: Admission: RE | Admit: 2016-04-27 | Payer: Medicare Other | Source: Ambulatory Visit | Admitting: Radiation Oncology

## 2016-05-08 ENCOUNTER — Encounter (HOSPITAL_COMMUNITY): Payer: Self-pay

## 2016-05-08 ENCOUNTER — Encounter (HOSPITAL_COMMUNITY)
Admission: RE | Admit: 2016-05-08 | Discharge: 2016-05-08 | Disposition: A | Discharge status: Home / Self Care | Payer: Medicare Other | Source: Ambulatory Visit | Attending: Hematology and Oncology | Admitting: Hematology and Oncology

## 2016-05-08 ENCOUNTER — Ambulatory Visit (HOSPITAL_COMMUNITY)
Admission: RE | Admit: 2016-05-08 | Discharge: 2016-05-08 | Disposition: A | Discharge status: Home / Self Care | Payer: Medicare Other | Source: Ambulatory Visit | Attending: Hematology and Oncology | Admitting: Hematology and Oncology

## 2016-05-08 DIAGNOSIS — C50512 Malignant neoplasm of lower-outer quadrant of left female breast: Secondary | ICD-10-CM | POA: Insufficient documentation

## 2016-05-08 DIAGNOSIS — C7951 Secondary malignant neoplasm of bone: Secondary | ICD-10-CM | POA: Diagnosis not present

## 2016-05-08 DIAGNOSIS — E042 Nontoxic multinodular goiter: Secondary | ICD-10-CM | POA: Diagnosis not present

## 2016-05-08 DIAGNOSIS — M545 Low back pain: Secondary | ICD-10-CM | POA: Diagnosis not present

## 2016-05-08 DIAGNOSIS — R918 Other nonspecific abnormal finding of lung field: Secondary | ICD-10-CM | POA: Insufficient documentation

## 2016-05-08 DIAGNOSIS — D259 Leiomyoma of uterus, unspecified: Secondary | ICD-10-CM | POA: Insufficient documentation

## 2016-05-08 MED ORDER — TECHNETIUM TC 99M MEDRONATE IV KIT
24.2000 | PACK | Freq: Once | INTRAVENOUS | Status: AC | PRN
  Administered 2016-05-08: 24.2 via INTRAVENOUS

## 2016-05-08 MED ORDER — IOPAMIDOL (ISOVUE-300) INJECTION 61%
100.0000 mL | Freq: Once | INTRAVENOUS | Status: AC | PRN
  Administered 2016-05-08: 100 mL via INTRAVENOUS

## 2016-05-09 ENCOUNTER — Other Ambulatory Visit (HOSPITAL_BASED_OUTPATIENT_CLINIC_OR_DEPARTMENT_OTHER): Payer: Medicare Other

## 2016-05-09 ENCOUNTER — Encounter: Payer: Self-pay | Admitting: Hematology and Oncology

## 2016-05-09 ENCOUNTER — Ambulatory Visit (HOSPITAL_BASED_OUTPATIENT_CLINIC_OR_DEPARTMENT_OTHER): Payer: Medicare Other | Admitting: Hematology and Oncology

## 2016-05-09 ENCOUNTER — Ambulatory Visit (HOSPITAL_BASED_OUTPATIENT_CLINIC_OR_DEPARTMENT_OTHER): Payer: Medicare Other

## 2016-05-09 VITALS — BP 119/78 | HR 99 | Temp 98.4°F | Resp 18 | Wt 208.3 lb

## 2016-05-09 DIAGNOSIS — C50512 Malignant neoplasm of lower-outer quadrant of left female breast: Secondary | ICD-10-CM | POA: Diagnosis not present

## 2016-05-09 DIAGNOSIS — C7951 Secondary malignant neoplasm of bone: Secondary | ICD-10-CM

## 2016-05-09 DIAGNOSIS — C773 Secondary and unspecified malignant neoplasm of axilla and upper limb lymph nodes: Secondary | ICD-10-CM

## 2016-05-09 DIAGNOSIS — C50919 Malignant neoplasm of unspecified site of unspecified female breast: Secondary | ICD-10-CM

## 2016-05-09 LAB — CBC WITH DIFFERENTIAL/PLATELET
BASO%: 1.3 % (ref 0.0–2.0)
BASOS ABS: 0 10*3/uL (ref 0.0–0.1)
EOS ABS: 0 10*3/uL (ref 0.0–0.5)
EOS%: 1.7 % (ref 0.0–7.0)
HEMATOCRIT: 35.1 % (ref 34.8–46.6)
HEMOGLOBIN: 11.9 g/dL (ref 11.6–15.9)
LYMPH#: 0.9 10*3/uL (ref 0.9–3.3)
LYMPH%: 32.2 % (ref 14.0–49.7)
MCH: 33 pg (ref 25.1–34.0)
MCHC: 34 g/dL (ref 31.5–36.0)
MCV: 97.1 fL (ref 79.5–101.0)
MONO#: 0.2 10*3/uL (ref 0.1–0.9)
MONO%: 6.4 % (ref 0.0–14.0)
NEUT#: 1.7 10*3/uL (ref 1.5–6.5)
NEUT%: 58.4 % (ref 38.4–76.8)
PLATELETS: 134 10*3/uL — AB (ref 145–400)
RBC: 3.62 10*6/uL — ABNORMAL LOW (ref 3.70–5.45)
RDW: 14.7 % — AB (ref 11.2–14.5)
WBC: 2.8 10*3/uL — ABNORMAL LOW (ref 3.9–10.3)

## 2016-05-09 LAB — COMPREHENSIVE METABOLIC PANEL
ALBUMIN: 3.8 g/dL (ref 3.5–5.0)
ALK PHOS: 37 U/L — AB (ref 40–150)
ALT: 13 U/L (ref 0–55)
ANION GAP: 9 meq/L (ref 3–11)
AST: 10 U/L (ref 5–34)
BUN: 15.7 mg/dL (ref 7.0–26.0)
CALCIUM: 9 mg/dL (ref 8.4–10.4)
CO2: 26 mEq/L (ref 22–29)
CREATININE: 0.9 mg/dL (ref 0.6–1.1)
Chloride: 107 mEq/L (ref 98–109)
EGFR: 74 mL/min/{1.73_m2} — ABNORMAL LOW (ref >90)
Glucose: 93 mg/dl (ref 70–140)
POTASSIUM: 3.7 meq/L (ref 3.5–5.1)
Sodium: 141 mEq/L (ref 136–145)
Total Bilirubin: 0.5 mg/dL (ref 0.20–1.20)
Total Protein: 6.6 g/dL (ref 6.4–8.3)

## 2016-05-09 MED ORDER — ZOLEDRONIC ACID 4 MG/100ML IV SOLN
4.0000 mg | Freq: Once | INTRAVENOUS | Status: AC
  Administered 2016-05-09: 4 mg via INTRAVENOUS
  Filled 2016-05-09: qty 100

## 2016-05-09 MED ORDER — SODIUM CHLORIDE 0.9 % IJ SOLN
10.0000 mL | INTRAMUSCULAR | Status: DC | PRN
  Administered 2016-05-09: 10 mL
  Filled 2016-05-09: qty 10

## 2016-05-09 MED ORDER — SODIUM CHLORIDE 0.9 % IV SOLN
Freq: Once | INTRAVENOUS | Status: AC
  Administered 2016-05-09: 11:00:00 via INTRAVENOUS

## 2016-05-09 MED ORDER — HEPARIN SOD (PORK) LOCK FLUSH 100 UNIT/ML IV SOLN
500.0000 [IU] | Freq: Once | INTRAVENOUS | Status: AC | PRN
  Administered 2016-05-09: 500 [IU]
  Filled 2016-05-09: qty 5

## 2016-05-09 NOTE — Assessment & Plan Note (Signed)
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 BRCA 2 mutation underwent salpingo-oophorectomy Status post bilateral mastectomies and radiation to left chest wall and axilla currently on tamoxifen since October 2012) Prior treatment: [place] day 1 and day 8 every 3 weeks 6 cycles started 06/29/2015 and completed 10/19/2015 PET/CT scan 11/01/2015:Focal patchy LUL opacities new/increased possibly reflecting infection or tumor, mild improving hypermet LN in Rt paratracheal and left infrahilar region, subcut mets to right shoulder and right post gluteal, ext bone mets CT angiogram 12/19/2015: No PE slight interval worsening of consolidative opacities within the left upper lobe ------------------------------------------------------------------------------------------------------------------------------------------------------- Current treatment: letrozole with Ibrance along with Zometa started 11/12/2015  Ibrance toxicities: 1. Peripheral Neuropathy 2. Neutropenia: grade 1: Did not require dose reduction. 3. Hot flashes related to letrozole  4. Nausea: Takes Zofran when necessary  Shortness of breath: CT scans do not reveal any underlying cause for shortness, it appears to have improved significantly Speech difficulties: I will refer her to a speech pathologist. I would also like to obtain a brain MRI. Severe anxiety and insomnia: I will referred her to a psychiatrist.  Her blood count was reviewed and  ANC is  . I recommended that she continue new cycle of Ibrance with letrozole starting today.  Bone metastases: Zometa Q monthly, calcium plus vitamin D, pain from the bone metastases improved with Roxicodone. It is also much improved than before.  Neck pain: Palliative radiation to the neck 02/16/2017only received 5 fractions, patient decided to discontinue Esophagitis: due to radiation. It has resolved Radiology review 05/08/2016: CT chest abdomen pelvis and bone scan showed improvement in the lung infiltrates as well as decrease in the uptake in couple of bone metastases.  Our plan is to repeat another CT scan in 4 months. I will see her back in 2 months with CBC with differential and labs.

## 2016-05-09 NOTE — Progress Notes (Unsigned)
.  Oral Chemotherapy Follow-Up Form  Original Start date of oral chemotherapy: _12/23/16______   Visited with patient in infusion room today to follow up regarding patient's oral chemotherapy medication: _Ibrance___  Pt is doing well today and labs within normal limits.  Patient will remain on current dose of Ibrance 125 mg daily x 21 days then off 7.  Patient reports no adverse effects and appreciates our follow-up.    Pt reports __0__ capsules/doses missed in the last week/month.  Missed dose(s) attributed to: ____________  Pt reports the following side effects: __nothing new to report_________  Other Issues: __Patient reports great scans._______   Will follow up at next lab on 06/09/16.   Thank you,  Henreitta Leber, PharmD [facility]

## 2016-05-09 NOTE — Patient Instructions (Signed)
\[id number]\\[id number]\Zoledronic Acid injection (Hypercalcemia, Oncology) What is this medicine? ZOLEDRONIC ACID (ZOE le dron ik AS id) lowers the amount of calcium loss from bone. It is used to treat too much calcium in your blood from cancer. It is also used to prevent complications of cancer that has spread to the bone. This medicine may be used for other purposes; ask your health care provider or pharmacist if you have questions. What should I tell my health care provider before I take this medicine? They need to know if you have any of these conditions: -aspirin-sensitive asthma -cancer, especially if you are receiving medicines used to treat cancer -dental disease or wear dentures -infection -kidney disease -receiving corticosteroids like dexamethasone or prednisone -an unusual or allergic reaction to zoledronic acid, other medicines, foods, dyes, or preservatives -pregnant or trying to get pregnant -breast-feeding How should I use this medicine? This medicine is for infusion into a vein. It is given by a health care professional in a hospital or clinic setting. Talk to your pediatrician regarding the use of this medicine in children. Special care may be needed. Overdosage: If you think you have taken too much of this medicine contact a poison control center or emergency room at once. NOTE: This medicine is only for you. Do not share this medicine with others. What if I miss a dose? It is important not to miss your dose. Call your doctor or health care professional if you are unable to keep an appointment. What may interact with this medicine? -certain antibiotics given by injection -NSAIDs, medicines for pain and inflammation, like ibuprofen or naproxen -some diuretics like bumetanide, furosemide -teriparatide -thalidomide This list may not describe all possible interactions. Give your health care provider a list of all the medicines, herbs, non-prescription drugs, or  dietary supplements you use. Also tell them if you smoke, drink alcohol, or use illegal drugs. Some items may interact with your medicine. What should I watch for while using this medicine? Visit your doctor or health care professional for regular checkups. It may be some time before you see the benefit from this medicine. Do not stop taking your medicine unless your doctor tells you to. Your doctor may order blood tests or other tests to see how you are doing. Women should inform their doctor if they wish to become pregnant or think they might be pregnant. There is a potential for serious side effects to an unborn child. Talk to your health care professional or pharmacist for more information. You should make sure that you get enough calcium and vitamin D while you are taking this medicine. Discuss the foods you eat and the vitamins you take with your health care professional. Some people who take this medicine have severe bone, joint, and/or muscle pain. This medicine may also increase your risk for jaw problems or a broken thigh bone. Tell your doctor right away if you have severe pain in your jaw, bones, joints, or muscles. Tell your doctor if you have any pain that does not go away or that gets worse. Tell your dentist and dental surgeon that you are taking this medicine. You should not have major dental surgery while on this medicine. See your dentist to have a dental exam and fix any dental problems before starting this medicine. Take good care of your teeth while on this medicine. Make sure you see your dentist for regular follow-up appointments. What side effects may I notice from receiving this medicine? Side effects that you should report to  your doctor or health care professional as soon as possible: -allergic reactions like skin rash, itching or hives, swelling of the face, lips, or tongue -anxiety, confusion, or depression -breathing problems -changes in vision -eye pain -feeling faint or  lightheaded, falls -jaw pain, especially after dental work -mouth sores -muscle cramps, stiffness, or weakness -redness, blistering, peeling or loosening of the skin, including inside the mouth -trouble passing urine or change in the amount of urine Side effects that usually do not require medical attention (report to your doctor or health care professional if they continue or are bothersome): -bone, joint, or muscle pain -constipation -diarrhea -fever -hair loss -irritation at site where injected -loss of appetite -nausea, vomiting -stomach upset -trouble sleeping -trouble swallowing -weak or tired This list may not describe all possible side effects. Call your doctor for medical advice about side effects. You may report side effects to FDA at 1-800-FDA-1088. Where should I keep my medicine? This drug is given in a hospital or clinic and will not be stored at home. NOTE: This sheet is a summary. It may not cover all possible information. If you have questions about this medicine, talk to your doctor, pharmacist, or health care provider.    2016, [employer] Standard. (2014-03-28 14:19:39)

## 2016-05-09 NOTE — Progress Notes (Signed)
Patient Care Team: Mackie Pai, PA-C as PCP - General (Physician Assistant) Thea Silversmith, MD as Consulting Physician (Radiation Oncology) Consuela Mimes, MD as Consulting Physician (Hematology and Oncology) Nicholas Lose, MD as Consulting Physician (Hematology and Oncology)  DIAGNOSIS: Breast cancer of lower-outer quadrant of left female breast [facility])   Staging form: Breast, AJCC 7th Edition     Pathologic: Stage IIIC (T4d, N3, cM0) - Signed by [name], MD on 01/19/2014   SUMMARY OF ONCOLOGIC HISTORY:   Breast cancer of lower-outer quadrant of left female breast ([place])   12/07/2010 - 04/12/2011 Neo-Adjuvant Chemotherapy Neoadjuvant FEC x6 followed by Taxotere x4   12/26/2010 Procedure Genetic testing showed mutation for BRCA2 [id number]   05/12/2011 Surgery Bilateral mastectomy with left axillary dissection 11/21 positive lymph nodes 5.8 cm tumor ER 95% PR 0% Ki-67 61% HER-2 negative ratio 1.39 T3, N3, 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   07/03/2011 - 08/17/2011 Radiation Therapy Radiation therapy to the chest and axilla   09/11/2011 -  Anti-estrogen oral therapy Tamoxifen later switched to Arimidex 07/2012   04/22/2012 Surgery Bilateral salpingo-oophorectomy   06/10/2015 Imaging Bone scan: uptake right lateral frontal bone, left malar region, manubrium, right 6,8 ribs; CT-CAP: New extensive infiltrate left & right hilar/mediastinal, right axillary, bil lower neck LN, 3.7 cm LUL consolidation, RML nodules,Lt Pl eff, RPLN   06/18/2015 Initial Biopsy Right axillary lymph node biopsy: 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, ER 90%, PR 0%, Ki-67 60%, HER-2 negative   06/29/2015 - 10/19/2015 Chemotherapy Halaven days 1 and 8 Q 3 weeks 6 cycles   11/01/2015 PET scan Focal patchy LUL opacities new/increased possibly reflecting infection or tumor, mild improving hypermet LN in Rt paratracheal and left infrahilar region, subcut mets to right shoulder and right post gluteal,  ext bone mets   11/08/2015 -  Anti-estrogen oral therapy Ibrance with Letrozole   12/30/2015 - 01/07/2016 Radiation Therapy Palliative radiation to the neck (stopped early due to esophagitis)    CHIEF COMPLIANT: Follow-up to discuss his CT scans  INTERVAL HISTORY: Tina Vaughan is a 55 year old with above-mentioned history of metastatic breast cancer currently on Ibrance with letrozole. She also gets Zometa for bone metastases. She had recently undergone CT chest abdomen pelvis and bone scan and is here today to discuss the results. She reports that overall her breathing is slightly better. Speech is improved. She has fewer episodes of difficulty with speech. She had met with the speech pathologist. She denies any significant nausea or vomiting. She does have fatigue. She is complaining about gaining weight. She has seen psychiatry with Dr.Jonnalagadda who has really helped her remarkably. She feels significantly better since that time.  REVIEW OF SYSTEMS:   Constitutional: Denies fevers, chills or abnormal weight loss Eyes: Denies blurriness of vision Ears, nose, mouth, throat, and face: Denies mucositis or sore throat Respiratory: Denies cough, dyspnea or wheezes Cardiovascular: Denies palpitation, chest discomfort Gastrointestinal:  Denies nausea, heartburn or change in bowel habits Skin: Denies abnormal skin rashes Lymphatics: Denies new lymphadenopathy or easy bruising Neurological:Denies numbness, tingling or new weaknesses Behavioral/Psych: Mood is stable, no new changes  Extremities: No lower extremity edema Breast:  denies any pain or lumps or nodules in either breasts All other systems were reviewed with the patient and are negative.  I have reviewed the past medical history, past surgical history, social history and family history with the patient and they are unchanged from previous note.  ALLERGIES:  is allergic to  doxycycline hydrochloride.  MEDICATIONS:  Current Outpatient  Prescriptions  Medication Sig Dispense Refill  . buPROPion (WELLBUTRIN XL) 300 MG 24 hr tablet Take 300 mg by mouth daily.    . Calcium 200 MG TABS Take 200 mg by mouth daily.     . cholecalciferol (VITAMIN D) 1000 UNITS tablet Take 1,000 Units by mouth daily.     . citalopram (CELEXA) 40 MG tablet TAKE 1 TABLET BY MOUTH DAILY. 30 tablet 6  . dexamethasone (DECADRON) 4 MG tablet Take 0.5 tablets (2 mg total) by mouth daily. (Patient not taking: Reported on 12/30/2015) 30 tablet 1  . fluocinonide cream (LIDEX) 9.38 % Apply 1 application topically 2 (two) times daily as needed (for rash.).    Marland Kitchen gabapentin (NEURONTIN) 300 MG capsule Take 1 capsule (300 mg total) by mouth daily. (Patient taking differently: Take 300 mg by mouth 3 (three) times daily. ) 30 capsule 5  . ibuprofen (ADVIL,MOTRIN) 200 MG tablet Take 600 mg by mouth every 8 (eight) hours as needed for moderate pain (pain).     Marland Kitchen letrozole (FEMARA) 2.5 MG tablet TAKE 1 TABLET BY MOUTH DAILY. 90 tablet 0  . lidocaine-prilocaine (EMLA) cream Apply to affected area once (Patient taking differently: Apply 1 application topically daily as needed. For port) 30 g 3  . LORazepam (ATIVAN) 1 MG tablet Take 1 mg by mouth 2 (two) times daily.    . naproxen sodium (ANAPROX) 220 MG tablet Take 220-440 mg by mouth 2 (two) times daily as needed (for muscle aches.). Reported on 03/23/2016    . omeprazole (PRILOSEC) 20 MG capsule Take 1 capsule (20 mg total) by mouth daily. Take 2 x  30-60 min before first meal of the day 30 capsule 3  . ondansetron (ZOFRAN) 8 MG tablet TAKE 1 TABLET BY MOUTH 2 TIMES DAILY. START THE DAY AFTER CHEMO FOR 2 DAYS. THEN TAKE AS NEEDED FOR NAUSEA OR VOMITING. 30 tablet 1  . oxyCODONE (ROXICODONE) 15 MG immediate release tablet Take 1 tablet (15 mg total) by mouth every 4 (four) hours as needed for pain. 90 tablet 0  . oxyCODONE-acetaminophen (PERCOCET) 10-325 MG tablet Take 1-2 tablets q 4 hrs PRN pain. 30 tablet 0  . palbociclib  (IBRANCE) 125 MG capsule Take 1 capsule (125 mg total) by mouth daily with breakfast. Take whole with food for 21 days, then take 7 days off 21 capsule 2  . ranitidine (ZANTAC) 150 MG tablet 2 at bedtime (Patient not taking: Reported on 03/23/2016)    . sucralfate (CARAFATE) 1 g tablet Dissolve 1 tablet in 10 mL H20 and swallow 30 min prior to meals and bedtime. (Patient not taking: Reported on 03/23/2016) 30 tablet 5  . UNABLE TO FIND Dispense compression sleeve 20-30 mm for this patient with history of breast cancer s/p surgery (Patient not taking: Reported on 03/23/2016) 1 each 0  . zolpidem (AMBIEN) 10 MG tablet Take 1 tablet (10 mg total) by mouth at bedtime. 30 tablet 2   No current facility-administered medications for this visit.   Facility-Administered Medications Ordered in Other Visits  Medication Dose Route Frequency Provider Last Rate Last Dose  . sodium chloride 0.9 % injection 10 mL  10 mL Intracatheter PRN Nicholas Lose, MD   10 mL at 05/09/16 1201    PHYSICAL EXAMINATION: ECOG PERFORMANCE STATUS: 1 - Symptomatic but completely ambulatory  Filed Vitals:   05/09/16 1025  BP: 119/78  Pulse: 99  Temp: 98.4 F (36.9 C)  Resp: 18  Filed Weights   05/09/16 1025  Weight: 208 lb 4.8 oz (94.484 kg)    GENERAL:alert, no distress and comfortable SKIN: skin color, texture, turgor are normal, no rashes or significant lesions EYES: normal, Conjunctiva are pink and non-injected, sclera clear OROPHARYNX:no exudate, no erythema and lips, buccal mucosa, and tongue normal  NECK: supple, thyroid normal size, non-tender, without nodularity LYMPH:  no palpable lymphadenopathy in the cervical, axillary or inguinal LUNGS: clear to auscultation and percussion with normal breathing effort HEART: regular rate & rhythm and no murmurs and no lower extremity edema ABDOMEN:abdomen soft, non-tender and normal bowel sounds MUSCULOSKELETAL:no cyanosis of digits and no clubbing  NEURO: alert &  oriented x 3 , no focal motor/sensory deficits EXTREMITIES: No lower extremity edema  LABORATORY DATA:  I have reviewed the data as listed   Chemistry      Component Value Date/Time   NA 141 05/09/2016 1009   NA 144 01/18/2016 2014   K 3.7 05/09/2016 1009   K 4.2 01/18/2016 2014   CL 110 01/18/2016 2014   CL 108* 07/30/2012 1430   CO2 26 05/09/2016 1009   CO2 22 01/18/2016 2014   BUN 15.7 05/09/2016 1009   BUN 14 01/18/2016 2014   CREATININE 0.9 05/09/2016 1009   CREATININE 0.61 01/18/2016 2014      Component Value Date/Time   CALCIUM 9.0 05/09/2016 1009   CALCIUM 9.7 01/18/2016 2014   ALKPHOS 37* 05/09/2016 1009   ALKPHOS 47 01/18/2016 2014   AST 10 05/09/2016 1009   AST 18 01/18/2016 2014   ALT 13 05/09/2016 1009   ALT 16 01/18/2016 2014   BILITOT 0.50 05/09/2016 1009   BILITOT 0.9 01/18/2016 2014       Lab Results  Component Value Date   WBC 2.8* 05/09/2016   HGB 11.9 05/09/2016   HCT 35.1 05/09/2016   MCV 97.1 05/09/2016   PLT 134* 05/09/2016   NEUTROABS 1.7 05/09/2016     ASSESSMENT & PLAN:  Breast cancer of lower-outer quadrant of left female breast ([facility]) Right axillary lymph node biopsy 06/18/2015: 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 BRCA 2 mutation underwent salpingo-oophorectomy Status post bilateral mastectomies and radiation to left chest wall and axilla currently on tamoxifen since October 2012) Prior treatment: Halaven day 1 and day 8 every 3 weeks 6 cycles started 06/29/2015 and completed  10/19/2015 PET/CT scan 11/01/2015:Focal patchy LUL opacities new/increased possibly reflecting infection or tumor, mild improving hypermet LN in Rt paratracheal and left infrahilar region, subcut mets to right shoulder and right post gluteal, ext bone mets CT angiogram 12/19/2015: No PE slight interval worsening of consolidative opacities within the left upper lobe ------------------------------------------------------------------------------------------------------------------------------------------------------- Current treatment: letrozole with Ibrance along with Zometa started 11/12/2015  Ibrance toxicities: 1. Peripheral Neuropathy 2. Neutropenia: grade 1: Did not require dose reduction. 3. Hot flashes related to letrozole  4. Nausea: Takes Zofran when necessary  Shortness of breath: CT scans do not reveal any underlying cause for shortness, it appears to have improved significantly Speech difficulties: I will refer her to a speech  pathologist. I would also like to obtain a brain MRI. Severe anxiety and insomnia: I will referred her to a psychiatrist.  Her blood count was reviewed and ANC is  . I recommended that she continue new cycle of Ibrance with letrozole starting today.  Bone metastases: Zometa Q monthly, calcium plus vitamin D, pain from the bone metastases improved with Roxicodone. It is also much improved than before.  Neck pain: Palliative radiation to the neck 02/16/2017only received 5 fractions, patient decided to discontinue Esophagitis: due to radiation. It has resolved Radiology review 05/08/2016: CT chest abdomen pelvis and bone scan showed improvement in the lung infiltrates as well as decrease in the uptake in couple of bone metastases.  Our plan is to repeat another CT scan in 4 months. I will see her back in 2 months with CBC with differential and labs.    No orders of the defined types were placed in this encounter.   The patient has a good understanding of  the overall plan. she agrees with it. she will call with any problems that may develop before the next visit here.   Rulon Eisenmenger, MD 05/09/2016

## 2016-05-22 ENCOUNTER — Telehealth: Payer: Self-pay | Admitting: Pharmacist

## 2016-05-22 ENCOUNTER — Other Ambulatory Visit: Payer: Self-pay

## 2016-05-22 DIAGNOSIS — C50512 Malignant neoplasm of lower-outer quadrant of left female breast: Secondary | ICD-10-CM

## 2016-05-22 MED ORDER — PALBOCICLIB 125 MG PO CAPS: 125.0000 mg | ORAL_CAPSULE | Freq: Every day | ORAL | Status: DC

## 2016-05-22 NOTE — Telephone Encounter (Signed)
I received a call in [facility] from [facility] [place], rep w/ [employer] pt assistance.  They need new RX for Ibrance. I will forward request to Dr. Lindi Adie & his desk RN. Will need to fax to [facility] (fax# [phone]). Kennith Center, Pharm.D., CPP 05/22/2016@9 :22 AM [facility]

## 2016-05-26 ENCOUNTER — Other Ambulatory Visit: Payer: Self-pay

## 2016-05-26 DIAGNOSIS — C50512 Malignant neoplasm of lower-outer quadrant of left female breast: Secondary | ICD-10-CM

## 2016-05-26 MED ORDER — PALBOCICLIB 125 MG PO CAPS: 125.0000 mg | ORAL_CAPSULE | Freq: Every day | ORAL | Status: DC

## 2016-05-26 NOTE — Telephone Encounter (Signed)
New prescription printed per request from [facility] with oral chemotherapy clinic.

## 2016-06-05 ENCOUNTER — Other Ambulatory Visit: Payer: Self-pay | Admitting: Hematology and Oncology

## 2016-06-05 DIAGNOSIS — C50512 Malignant neoplasm of lower-outer quadrant of left female breast: Secondary | ICD-10-CM

## 2016-06-05 NOTE — Telephone Encounter (Signed)
Chart reviewed.

## 2016-06-05 NOTE — Telephone Encounter (Signed)
Pt called for oxycodone 15 mg refill after her pharmacy told her to call as well as they will send the request. Please call when Rx ready for pickup. She has about 2 days (4 pills) left.

## 2016-06-09 ENCOUNTER — Other Ambulatory Visit (HOSPITAL_BASED_OUTPATIENT_CLINIC_OR_DEPARTMENT_OTHER): Payer: Medicare Other

## 2016-06-09 ENCOUNTER — Telehealth: Payer: Self-pay | Admitting: *Deleted

## 2016-06-09 ENCOUNTER — Ambulatory Visit (HOSPITAL_BASED_OUTPATIENT_CLINIC_OR_DEPARTMENT_OTHER): Payer: Medicare Other

## 2016-06-09 ENCOUNTER — Other Ambulatory Visit: Payer: Self-pay | Admitting: Hematology and Oncology

## 2016-06-09 VITALS — BP 127/87 | HR 95 | Temp 98.1°F | Resp 20

## 2016-06-09 DIAGNOSIS — C773 Secondary and unspecified malignant neoplasm of axilla and upper limb lymph nodes: Secondary | ICD-10-CM | POA: Diagnosis not present

## 2016-06-09 DIAGNOSIS — C50512 Malignant neoplasm of lower-outer quadrant of left female breast: Secondary | ICD-10-CM

## 2016-06-09 DIAGNOSIS — C7951 Secondary malignant neoplasm of bone: Secondary | ICD-10-CM | POA: Diagnosis present

## 2016-06-09 DIAGNOSIS — C50919 Malignant neoplasm of unspecified site of unspecified female breast: Secondary | ICD-10-CM

## 2016-06-09 LAB — CBC WITH DIFFERENTIAL/PLATELET
BASO%: 0.7 % (ref 0.0–2.0)
BASOS ABS: 0 10*3/uL (ref 0.0–0.1)
EOS ABS: 0.1 10*3/uL (ref 0.0–0.5)
EOS%: 2.7 % (ref 0.0–7.0)
HCT: 35.7 % (ref 34.8–46.6)
HGB: 12.3 g/dL (ref 11.6–15.9)
LYMPH%: 32.1 % (ref 14.0–49.7)
MCH: 32.9 pg (ref 25.1–34.0)
MCHC: 34.5 g/dL (ref 31.5–36.0)
MCV: 95.5 fL (ref 79.5–101.0)
MONO#: 0.2 10*3/uL (ref 0.1–0.9)
MONO%: 5.8 % (ref 0.0–14.0)
NEUT%: 58.7 % (ref 38.4–76.8)
NEUTROS ABS: 2.1 10*3/uL (ref 1.5–6.5)
Platelets: 189 10*3/uL (ref 145–400)
RBC: 3.74 10*6/uL (ref 3.70–5.45)
RDW: 14 % (ref 11.2–14.5)
WBC: 3.6 10*3/uL — AB (ref 3.9–10.3)
lymph#: 1.1 10*3/uL (ref 0.9–3.3)

## 2016-06-09 LAB — COMPREHENSIVE METABOLIC PANEL
ALT: 15 U/L (ref 0–55)
AST: 12 U/L (ref 5–34)
Albumin: 4 g/dL (ref 3.5–5.0)
Alkaline Phosphatase: 47 U/L (ref 40–150)
Anion Gap: 10 mEq/L (ref 3–11)
BUN: 13.8 mg/dL (ref 7.0–26.0)
CHLORIDE: 108 meq/L (ref 98–109)
CO2: 24 meq/L (ref 22–29)
Calcium: 9.1 mg/dL (ref 8.4–10.4)
Creatinine: 0.9 mg/dL (ref 0.6–1.1)
EGFR: 77 mL/min/{1.73_m2} — AB (ref >90)
GLUCOSE: 83 mg/dL (ref 70–140)
Potassium: 3.6 mEq/L (ref 3.5–5.1)
Sodium: 142 mEq/L (ref 136–145)
Total Bilirubin: 0.44 mg/dL (ref 0.20–1.20)
Total Protein: 7.1 g/dL (ref 6.4–8.3)

## 2016-06-09 MED ORDER — SODIUM CHLORIDE 0.9 % IJ SOLN
10.0000 mL | INTRAMUSCULAR | Status: DC | PRN
  Administered 2016-06-09: 10 mL via INTRAVENOUS
  Filled 2016-06-09: qty 10

## 2016-06-09 MED ORDER — HEPARIN SOD (PORK) LOCK FLUSH 100 UNIT/ML IV SOLN
500.0000 [IU] | Freq: Once | INTRAVENOUS | Status: AC | PRN
  Administered 2016-06-09: 500 [IU] via INTRAVENOUS
  Filled 2016-06-09: qty 5

## 2016-06-09 MED ORDER — ZOLEDRONIC ACID 4 MG/100ML IV SOLN
4.0000 mg | Freq: Once | INTRAVENOUS | Status: AC
  Administered 2016-06-09: 4 mg via INTRAVENOUS
  Filled 2016-06-09: qty 100

## 2016-06-09 MED ORDER — SODIUM CHLORIDE 0.9 % IV SOLN
Freq: Once | INTRAVENOUS | Status: AC
  Administered 2016-06-09: 11:00:00 via INTRAVENOUS

## 2016-06-09 NOTE — Patient Instructions (Signed)

## 2016-06-09 NOTE — Telephone Encounter (Signed)
Notified by lab tech that patient needed to talk to nurse-pt wrote on walk-in sheet that she is very nervous.  Went to lobby and located patient and her sister and spoke with them in a private area.  Pt states that she is having many days where she will feel suddenly very anxious and tearful. This is making it difficult for her to function well.. Pt states she sees a psychiatrist and has been prescribed Wellbutrin and Celexa. She is asking for something else to help calm her down.  She states that the psychiatrist's office 'screwed up' her Medicare paper work and she can't be seen again until September. Pt is asking for Xanax. Reviewed pt's meds-she is also on Ativan. Explained that Dr. Lindi Adie had referred her to the Psychiatrist to help her with her anxiety issues.  Pt states she doesn't know if she can wait until September to see psych MD.   Discussed with Dr. Lindi Adie and he wants the psych MD to continue to handle anxiety/sleep/depression medications for this pt. Explained this to her and she voices understanding. Encouraged her and her sister to call back to Psychiatrist to get an earlier appt. Sister agreed that they will do that this afternoon.

## 2016-06-27 ENCOUNTER — Other Ambulatory Visit: Payer: Self-pay | Admitting: Hematology and Oncology

## 2016-06-27 DIAGNOSIS — C50512 Malignant neoplasm of lower-outer quadrant of left female breast: Secondary | ICD-10-CM

## 2016-07-06 ENCOUNTER — Other Ambulatory Visit: Payer: Self-pay

## 2016-07-06 DIAGNOSIS — C50512 Malignant neoplasm of lower-outer quadrant of left female breast: Secondary | ICD-10-CM

## 2016-07-07 ENCOUNTER — Ambulatory Visit (HOSPITAL_BASED_OUTPATIENT_CLINIC_OR_DEPARTMENT_OTHER): Payer: Medicare Other

## 2016-07-07 ENCOUNTER — Other Ambulatory Visit (HOSPITAL_BASED_OUTPATIENT_CLINIC_OR_DEPARTMENT_OTHER): Payer: Medicare Other

## 2016-07-07 ENCOUNTER — Ambulatory Visit (HOSPITAL_BASED_OUTPATIENT_CLINIC_OR_DEPARTMENT_OTHER): Payer: Medicare Other | Admitting: Hematology and Oncology

## 2016-07-07 ENCOUNTER — Telehealth: Payer: Self-pay | Admitting: Hematology and Oncology

## 2016-07-07 ENCOUNTER — Encounter: Payer: Self-pay | Admitting: Hematology and Oncology

## 2016-07-07 DIAGNOSIS — N951 Menopausal and female climacteric states: Secondary | ICD-10-CM

## 2016-07-07 DIAGNOSIS — C50512 Malignant neoplasm of lower-outer quadrant of left female breast: Secondary | ICD-10-CM

## 2016-07-07 DIAGNOSIS — C7951 Secondary malignant neoplasm of bone: Secondary | ICD-10-CM

## 2016-07-07 DIAGNOSIS — G620 Drug-induced polyneuropathy: Secondary | ICD-10-CM | POA: Diagnosis not present

## 2016-07-07 DIAGNOSIS — G4700 Insomnia, unspecified: Secondary | ICD-10-CM | POA: Diagnosis not present

## 2016-07-07 DIAGNOSIS — C773 Secondary and unspecified malignant neoplasm of axilla and upper limb lymph nodes: Secondary | ICD-10-CM

## 2016-07-07 DIAGNOSIS — F411 Generalized anxiety disorder: Secondary | ICD-10-CM

## 2016-07-07 DIAGNOSIS — M545 Low back pain: Secondary | ICD-10-CM | POA: Diagnosis not present

## 2016-07-07 DIAGNOSIS — Z95828 Presence of other vascular implants and grafts: Secondary | ICD-10-CM

## 2016-07-07 LAB — CBC WITH DIFFERENTIAL/PLATELET
BASO%: 0.8 % (ref 0.0–2.0)
Basophils Absolute: 0 10*3/uL (ref 0.0–0.1)
EOS%: 1.5 % (ref 0.0–7.0)
Eosinophils Absolute: 0.1 10*3/uL (ref 0.0–0.5)
HCT: 38.7 % (ref 34.8–46.6)
HEMOGLOBIN: 13.3 g/dL (ref 11.6–15.9)
LYMPH%: 22.3 % (ref 14.0–49.7)
MCH: 32.6 pg (ref 25.1–34.0)
MCHC: 34.5 g/dL (ref 31.5–36.0)
MCV: 94.6 fL (ref 79.5–101.0)
MONO#: 0.5 10*3/uL (ref 0.1–0.9)
MONO%: 8.9 % (ref 0.0–14.0)
NEUT%: 66.5 % (ref 38.4–76.8)
NEUTROS ABS: 3.8 10*3/uL (ref 1.5–6.5)
PLATELETS: 200 10*3/uL (ref 145–400)
RBC: 4.09 10*6/uL (ref 3.70–5.45)
RDW: 13.3 % (ref 11.2–14.5)
WBC: 5.6 10*3/uL (ref 3.9–10.3)
lymph#: 1.3 10*3/uL (ref 0.9–3.3)

## 2016-07-07 LAB — COMPREHENSIVE METABOLIC PANEL
ALT: 14 U/L (ref 0–55)
ANION GAP: 9 meq/L (ref 3–11)
AST: 12 U/L (ref 5–34)
Albumin: 4 g/dL (ref 3.5–5.0)
Alkaline Phosphatase: 42 U/L (ref 40–150)
BILIRUBIN TOTAL: 0.58 mg/dL (ref 0.20–1.20)
BUN: 13.6 mg/dL (ref 7.0–26.0)
CALCIUM: 9.5 mg/dL (ref 8.4–10.4)
CO2: 26 meq/L (ref 22–29)
CREATININE: 0.8 mg/dL (ref 0.6–1.1)
Chloride: 107 mEq/L (ref 98–109)
EGFR: 81 mL/min/{1.73_m2} — ABNORMAL LOW (ref >90)
Glucose: 96 mg/dl (ref 70–140)
Potassium: 3.9 mEq/L (ref 3.5–5.1)
Sodium: 142 mEq/L (ref 136–145)
TOTAL PROTEIN: 7.2 g/dL (ref 6.4–8.3)

## 2016-07-07 MED ORDER — SODIUM CHLORIDE 0.9 % IV SOLN
Freq: Once | INTRAVENOUS | Status: AC
  Administered 2016-07-07: 12:00:00 via INTRAVENOUS

## 2016-07-07 MED ORDER — ZOLEDRONIC ACID 4 MG/100ML IV SOLN
4.0000 mg | Freq: Once | INTRAVENOUS | Status: AC
  Administered 2016-07-07: 4 mg via INTRAVENOUS
  Filled 2016-07-07: qty 100

## 2016-07-07 MED ORDER — HEPARIN SOD (PORK) LOCK FLUSH 100 UNIT/ML IV SOLN
500.0000 [IU] | Freq: Once | INTRAVENOUS | Status: AC
  Administered 2016-07-07: 500 [IU] via INTRAVENOUS
  Filled 2016-07-07: qty 5

## 2016-07-07 MED ORDER — SODIUM CHLORIDE 0.9% FLUSH
10.0000 mL | INTRAVENOUS | Status: DC | PRN
  Administered 2016-07-07: 10 mL via INTRAVENOUS
  Filled 2016-07-07: qty 10

## 2016-07-07 NOTE — Assessment & Plan Note (Signed)
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 BRCA 2 mutation underwent salpingo-oophorectomy Status post bilateral mastectomies and radiation to left chest wall and axilla currently on tamoxifen since October 2012) Prior treatment: Halaven day 1 and day 8 every 3 weeks 6 cycles started 06/29/2015 and completed 10/19/2015 PET/CT scan 11/01/2015:Focal patchy LUL opacities new/increased possibly reflecting infection or tumor, mild improving hypermet LN in Rt paratracheal and left infrahilar region, subcut mets to right shoulder and right post gluteal, ext bone mets CT angiogram 12/19/2015: No PE slight interval worsening of consolidative opacities within the left upper lobe ------------------------------------------------------------------------------------------------------------------------------------------------------- Current treatment: letrozole with Ibrance along with Zometa started 11/12/2015  Ibrance toxicities: 1. Peripheral Neuropathy 2. Neutropenia: grade 1: Did not require dose reduction. 3. Hot flashes related to letrozole  4. Nausea: Takes Zofran when necessary  Shortness of breath: CT scans do not reveal any underlying cause for shortness, it appears to have improved significantly Speech difficulties: Seen a speech pathologist. Severe anxiety and insomnia: Currently seen a psychiatrist Dr.Jonnalagadda who has been of tremendous help to the patient.   Her blood count was reviewed  and ANC is  . I recommended that she continue new cycle of Ibrance with letrozole starting today.  Bone metastases: Zometa Q monthly, calcium plus vitamin D, pain from the bone metastases improved with Roxicodone. It is also much improved than before.  Neck pain: Palliative radiation to the neck 02/16/2017only received 5 fractions, patient decided to discontinue Esophagitis: due to radiation. It has resolved Radiology review 05/08/2016: CT chest abdomen pelvis and bone scan showed improvement in the lung infiltrates as well as decrease in the uptake in couple of bone metastases.  I will see her back in 2 months with CBC with differential and labs and CT scans.

## 2016-07-07 NOTE — Progress Notes (Signed)
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 ([place])   12/07/2010 - 04/12/2011 Neo-Adjuvant Chemotherapy    Neoadjuvant FEC x6 followed by Taxotere x4      12/26/2010 Procedure    Genetic testing showed mutation for BRCA2 [id number]      05/12/2011 Surgery    Bilateral mastectomy with left axillary dissection 11/21 positive lymph nodes 5.8 cm tumor ER 95% PR 0% Ki-67 61% HER-2 negative ratio 1.39 T3, N3, 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      07/03/2011 - 08/17/2011 Radiation Therapy    Radiation therapy to the chest and axilla      09/11/2011 -  Anti-estrogen oral therapy    Tamoxifen later switched to Arimidex 07/2012      04/22/2012 Surgery    Bilateral salpingo-oophorectomy      06/10/2015 Imaging    Bone scan: uptake right lateral frontal bone, left malar region, manubrium, right 6,8 ribs; CT-CAP: New extensive infiltrate left & right hilar/mediastinal, right axillary, bil lower neck LN, 3.7 cm LUL consolidation, RML nodules,Lt Pl eff, RPLN      06/18/2015 Initial Biopsy    Right axillary lymph node biopsy: 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, ER 90%, PR 0%, Ki-67 60%, HER-2 negative      06/29/2015 - 10/19/2015 Chemotherapy    Halaven days 1 and 8 Q 3 weeks 6 cycles      11/01/2015 PET scan    Focal patchy LUL opacities new/increased possibly reflecting infection or tumor, mild improving hypermet LN in Rt paratracheal  and left infrahilar region, subcut mets to right shoulder and right post gluteal, ext bone mets      11/08/2015 -  Anti-estrogen oral therapy    Ibrance with Letrozole      12/30/2015 - 01/07/2016 Radiation Therapy    Palliative radiation to the neck (stopped early due to esophagitis)       CHIEF COMPLIANT:  Follow-up  On Ibrance with letrozole  INTERVAL HISTORY: Tina Vaughan is a  55 year old with above-mentioned history of metastatic breast cancer currently on Ibrance wit letrozole since December 2016. She has been tolerating the medication extremely well.  She denies any nausea vomiting to the medication. Although last couple of days she may have had a viral illness and she had thrown up once. Her anxiety levels it appeaer to be fluctuating. She has seen psychiatry but she is not quite happy.  She continues to have intermittent low back pain and takes pain medication for this.  REVIEW OF SYSTEMS:   Constitutional: Denies fevers, chills or abnormal weight loss Eyes: Denies blurriness of vision Ears, nose, mouth, throat, and face: Denies mucositis or sore throat Respiratory: Denies cough, dyspnea or wheezes Cardiovascular: Denies palpitation, chest discomfort Gastrointestinal:  Denies nausea, heartburn or change in bowel habits Skin: Denies abnormal skin rashes Lymphatics: Denies new lymphadenopathy or easy bruising Neurological:Denies numbness, tingling or new weaknesses Behavioral/Psych: Anxiety  Extremities: No lower extremity edema  All other systems were reviewed  with the patient and are negative.  I have reviewed the past medical history, past surgical history, social history and family history with the patient and they are unchanged from previous note.  ALLERGIES:  is allergic to doxycycline hydrochloride.  MEDICATIONS:  Current Outpatient Prescriptions  Medication Sig Dispense Refill  . buPROPion (WELLBUTRIN XL) 300 MG 24 hr tablet Take 300 mg by mouth daily.    .  Calcium 200 MG TABS Take 200 mg by mouth daily.     . cholecalciferol (VITAMIN D) 1000 UNITS tablet Take 1,000 Units by mouth daily.     . citalopram (CELEXA) 40 MG tablet TAKE 1 TABLET BY MOUTH DAILY. 30 tablet 6  . dexamethasone (DECADRON) 4 MG tablet Take 0.5 tablets (2 mg total) by mouth daily. (Patient not taking: Reported on 12/30/2015) 30 tablet 1  . fluocinonide cream (LIDEX) 7.74 % Apply 1 application topically 2 (two) times daily as needed (for rash.).    Marland Kitchen gabapentin (NEURONTIN) 300 MG capsule Take 1 capsule (300 mg total) by mouth daily. (Patient taking differently: Take 300 mg by mouth 3 (three) times daily. ) 30 capsule 5  . ibuprofen (ADVIL,MOTRIN) 200 MG tablet Take 600 mg by mouth every 8 (eight) hours as needed for moderate pain (pain).     Marland Kitchen letrozole (FEMARA) 2.5 MG tablet TAKE 1 TABLET BY MOUTH DAILY. 90 tablet 0  . lidocaine-prilocaine (EMLA) cream Apply to affected area once (Patient taking differently: Apply 1 application topically daily as needed. For port) 30 g 3  . LORazepam (ATIVAN) 1 MG tablet Take 1 mg by mouth 2 (two) times daily.    . naproxen sodium (ANAPROX) 220 MG tablet Take 220-440 mg by mouth 2 (two) times daily as needed (for muscle aches.). Reported on 03/23/2016    . omeprazole (PRILOSEC) 20 MG capsule Take 1 capsule (20 mg total) by mouth daily. Take 2 x  30-60 min before first meal of the day 30 capsule 3  . ondansetron (ZOFRAN) 8 MG tablet TAKE 1 TABLET BY MOUTH 2 TIMES DAILY. START THE DAY AFTER CHEMO FOR 2 DAYS. THEN TAKE AS NEEDED FOR NAUSEA OR VOMITING. 30 tablet 1  . oxyCODONE (ROXICODONE) 15 MG immediate release tablet TAKE 1 TABLET BY MOUTH EVERY 4 HOURS AS NEEDED FOR PAIN. 90 tablet 0  . oxyCODONE-acetaminophen (PERCOCET) 10-325 MG tablet Take 1-2 tablets q 4 hrs PRN pain. 30 tablet 0  . palbociclib (IBRANCE) 125 MG capsule Take 1 capsule (125 mg total) by mouth daily with breakfast. Take whole with food for 21 days, then take 7 days off 21 capsule 6    . ranitidine (ZANTAC) 150 MG tablet 2 at bedtime (Patient not taking: Reported on 03/23/2016)    . sucralfate (CARAFATE) 1 g tablet Dissolve 1 tablet in 10 mL H20 and swallow 30 min prior to meals and bedtime. (Patient not taking: Reported on 03/23/2016) 30 tablet 5  . UNABLE TO FIND Dispense compression sleeve 20-30 mm for this patient with history of breast cancer s/p surgery (Patient not taking: Reported on 03/23/2016) 1 each 0  . zolpidem (AMBIEN) 10 MG tablet TAKE 1 TABLET BY MOUTH AT BEDTIME. (FILL 04/01/16) 30 tablet 2   No current facility-administered medications for this visit.     PHYSICAL EXAMINATION: ECOG PERFORMANCE STATUS: 1 - Symptomatic but completely ambulatory  Vitals:   07/07/16 1013  BP: 110/72  Pulse: 95  Resp: 19  Temp: 98.5 F (36.9 C)   Filed Weights   07/07/16 1013  Weight: 209 lb 12.8 oz (95.2 kg)    GENERAL:alert, no distress and comfortable SKIN: skin color, texture, turgor are normal, no rashes or significant lesions EYES: normal, Conjunctiva are pink and non-injected, sclera clear OROPHARYNX:no exudate, no erythema and lips, buccal mucosa, and tongue normal  NECK: supple, thyroid normal size, non-tender, without nodularity LYMPH:  no palpable lymphadenopathy in the cervical, axillary or inguinal LUNGS: clear to auscultation and percussion with normal breathing effort HEART: regular rate & rhythm and no murmurs and no lower extremity edema ABDOMEN:abdomen soft, non-tender and normal bowel sounds MUSCULOSKELETAL:no cyanosis of digits and no clubbing  NEURO: alert & oriented x 3 with fluent speech, no focal motor/sensory deficits EXTREMITIES: No lower extremity edema  LABORATORY DATA:  I have reviewed the data as listed   Chemistry      Component Value Date/Time   NA 142 07/07/2016 0955   K 3.9 07/07/2016 0955   CL 110 01/18/2016 2014   CL 108 (H) 07/30/2012 1430   CO2 26 07/07/2016 0955   BUN 13.6 07/07/2016 0955   CREATININE 0.8 07/07/2016  0955      Component Value Date/Time   CALCIUM 9.5 07/07/2016 0955   ALKPHOS 42 07/07/2016 0955   AST 12 07/07/2016 0955   ALT 14 07/07/2016 0955   BILITOT 0.58 07/07/2016 0955       Lab Results  Component Value Date   WBC 5.6 07/07/2016   HGB 13.3 07/07/2016   HCT 38.7 07/07/2016   MCV 94.6 07/07/2016   PLT 200 07/07/2016   NEUTROABS 3.8 07/07/2016     ASSESSMENT & PLAN:  Breast cancer of lower-outer quadrant of left female breast ([place]) Right axillary lymph node biopsy 06/18/2015: 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 BRCA 2 mutation underwent salpingo-oophorectomy Status post bilateral mastectomies and radiation to left chest wall and axilla currently on tamoxifen since October 2012) Prior treatment: Halaven day 1 and day 8 every 3 weeks 6 cycles started 06/29/2015 and completed 10/19/2015 PET/CT scan 11/01/2015:Focal patchy LUL opacities new/increased possibly reflecting infection or tumor, mild improving hypermet LN in Rt paratracheal and left infrahilar region, subcut mets to right shoulder and right post gluteal, ext bone mets CT angiogram 12/19/2015: No PE slight interval worsening of consolidative opacities within the left upper lobe ------------------------------------------------------------------------------------------------------------------------------------------------------- Current treatment: letrozole with Ibrance along with Zometa started 11/12/2015  Ibrance toxicities: 1. Peripheral Neuropathy 2.  Neutropenia: grade 1: Did not require dose reduction. 3. Hot flashes related to letrozole  4. Nausea: Takes Zofran when necessary  Shortness of breath: CT scans do not reveal any underlying cause for shortness, it appears to have improved significantly Speech difficulties: Seen a speech pathologist. Severe anxiety and insomnia: Currently seen a psychiatrist Dr.Jonnalagadda who has been of tremendous help to the patient.   Her blood count was reviewed and ANC is  . I recommended that she continue new cycle of Ibrance with letrozole starting today.  Bone metastases: Zometa Q monthly, calcium plus vitamin D, pain from the bone metastases improved with Roxicodone. It is also much improved than  before.  Neck pain: Palliative radiation to the neck 02/16/2017only received 5 fractions, patient decided to discontinue Esophagitis: due to radiation. It has resolved Radiology review 05/08/2016: CT chest abdomen pelvis and bone scan showed improvement in the lung infiltrates as well as decrease in the uptake in couple of bone metastases.  I will see her back in 2 months with CBC with differential and labs and CT scans.   Orders Placed This Encounter  Procedures  . CT Abdomen Pelvis W Contrast    Standing Status:   Future    Standing Expiration Date:   07/07/2017    Order Specific Question:   If indicated for the ordered procedure, I authorize the administration of contrast media per Radiology protocol    Answer:   Yes    Order Specific Question:   Reason for Exam (SYMPTOM  OR DIAGNOSIS REQUIRED)    Answer:   Met breast cancer restaging    Order Specific Question:   Is patient pregnant?    Answer:   No    Order Specific Question:   Preferred imaging location?    Answer:   [facility]  . CT Chest W Contrast    Standing Status:   Future    Standing Expiration Date:   07/07/2017    Order Specific Question:   If indicated for the ordered procedure, I authorize the administration of  contrast media per Radiology protocol    Answer:   Yes    Order Specific Question:   Reason for Exam (SYMPTOM  OR DIAGNOSIS REQUIRED)    Answer:   Met breast cancer restaging    Order Specific Question:   Is patient pregnant?    Answer:   No    Order Specific Question:   Preferred imaging location?    Answer:   [facility]    Standing Status:   Future    Standing Expiration Date:   07/07/2017    Order Specific Question:   Reason for Exam (SYMPTOM  OR DIAGNOSIS REQUIRED)    Answer:   Met breast cancer    Order Specific Question:   Is the patient pregnant?    Answer:   No    Order Specific Question:   Preferred imaging location?    Answer:   [facility]    Order Specific Question:   If indicated for the ordered procedure, I authorize the administration of a radiopharmaceutical per Radiology protocol    Answer:   Yes   The patient has a good understanding of the overall plan. she agrees with it. she will call with any problems that may develop before the next visit here.   Rulon Eisenmenger, MD 07/07/16

## 2016-07-07 NOTE — Patient Instructions (Signed)

## 2016-07-07 NOTE — Telephone Encounter (Signed)
appt made and avs printed. Pt to pick up contrast at 9/29 visit. Ct to be scheduled by radiology

## 2016-07-13 DIAGNOSIS — C773 Secondary and unspecified malignant neoplasm of axilla and upper limb lymph nodes: Secondary | ICD-10-CM | POA: Diagnosis not present

## 2016-07-13 DIAGNOSIS — C50912 Malignant neoplasm of unspecified site of left female breast: Secondary | ICD-10-CM | POA: Diagnosis not present

## 2016-07-18 ENCOUNTER — Telehealth: Payer: Self-pay | Admitting: Radiation Therapy

## 2016-07-18 NOTE — Telephone Encounter (Signed)
Called when I noticed that the 9/8 Brain MRI had been cancelled. I asked Tina Vaughan  when she is interested in rescheduling. Pt stated that she is not interested in having another MRI at this time,"this would be the third MRI in a short while, and the area was almost gone on the last one." Tina Vaughan believes that the area is gone and does not want to have another scan. She also cancelled the follow-up that was scheduled with Dr. Sondra Come and said that she will just continue with Dr. Lindi Adie and have another follow-up scan with him when necessary.          I will pass this information along to doctors Kinard and Gudena via message routing.   Mont Dutton R.T.(R)(T) Special Procedures Navigator

## 2016-07-21 ENCOUNTER — Other Ambulatory Visit: Payer: Medicare Other

## 2016-07-25 ENCOUNTER — Telehealth: Payer: Self-pay | Admitting: Pharmacist

## 2016-07-25 NOTE — Telephone Encounter (Signed)
Received communication from [employer] patient assistance program on clinic VM. Returned call to [phone].  Caller wanted to confirm that patient was still taking Ibrance. Confirmed shipping address, etc. [employer] will call patient to ship next refill rx.   Raul Del, PharmD, [place], [facility] [phone]

## 2016-07-27 ENCOUNTER — Ambulatory Visit: Payer: Self-pay | Admitting: Radiation Oncology

## 2016-08-01 ENCOUNTER — Other Ambulatory Visit: Payer: Self-pay

## 2016-08-01 DIAGNOSIS — C50512 Malignant neoplasm of lower-outer quadrant of left female breast: Secondary | ICD-10-CM

## 2016-08-01 MED ORDER — OXYCODONE HCL 15 MG PO TABS: 15.0000 mg | ORAL_TABLET | ORAL | 0 refills | Status: DC | PRN

## 2016-08-08 ENCOUNTER — Telehealth: Payer: Self-pay | Admitting: *Deleted

## 2016-08-08 NOTE — Telephone Encounter (Signed)
Patient called wanting advice about her symptoms. Patient states that she has developed increase back/hip pain. Patient has not had a appetite but is not losing weight. Patient has noticed shaking over her body constantly. Patient would like to know is this progression in the disease process. If so, she can understand. But if this is not, then she would like to know what can be done about this.

## 2016-08-08 NOTE — Telephone Encounter (Signed)
I can see her with her Zometa appt on 29th VG

## 2016-08-11 ENCOUNTER — Ambulatory Visit (HOSPITAL_BASED_OUTPATIENT_CLINIC_OR_DEPARTMENT_OTHER): Payer: Medicare Other | Admitting: Hematology and Oncology

## 2016-08-11 ENCOUNTER — Other Ambulatory Visit (HOSPITAL_BASED_OUTPATIENT_CLINIC_OR_DEPARTMENT_OTHER): Payer: Medicare Other

## 2016-08-11 ENCOUNTER — Ambulatory Visit (HOSPITAL_BASED_OUTPATIENT_CLINIC_OR_DEPARTMENT_OTHER): Payer: Medicare Other

## 2016-08-11 ENCOUNTER — Encounter: Payer: Self-pay | Admitting: Hematology and Oncology

## 2016-08-11 ENCOUNTER — Telehealth: Payer: Self-pay

## 2016-08-11 VITALS — BP 116/88 | HR 109 | Temp 98.6°F | Resp 18 | Wt 213.1 lb

## 2016-08-11 DIAGNOSIS — C773 Secondary and unspecified malignant neoplasm of axilla and upper limb lymph nodes: Secondary | ICD-10-CM

## 2016-08-11 DIAGNOSIS — G620 Drug-induced polyneuropathy: Secondary | ICD-10-CM | POA: Diagnosis not present

## 2016-08-11 DIAGNOSIS — R5383 Other fatigue: Secondary | ICD-10-CM

## 2016-08-11 DIAGNOSIS — F411 Generalized anxiety disorder: Secondary | ICD-10-CM

## 2016-08-11 DIAGNOSIS — N951 Menopausal and female climacteric states: Secondary | ICD-10-CM | POA: Diagnosis not present

## 2016-08-11 DIAGNOSIS — G4700 Insomnia, unspecified: Secondary | ICD-10-CM | POA: Diagnosis not present

## 2016-08-11 DIAGNOSIS — D701 Agranulocytosis secondary to cancer chemotherapy: Secondary | ICD-10-CM

## 2016-08-11 DIAGNOSIS — C7951 Secondary malignant neoplasm of bone: Secondary | ICD-10-CM | POA: Diagnosis not present

## 2016-08-11 DIAGNOSIS — C50512 Malignant neoplasm of lower-outer quadrant of left female breast: Secondary | ICD-10-CM

## 2016-08-11 DIAGNOSIS — R0602 Shortness of breath: Secondary | ICD-10-CM | POA: Diagnosis not present

## 2016-08-11 DIAGNOSIS — R479 Unspecified speech disturbances: Secondary | ICD-10-CM

## 2016-08-11 LAB — CBC WITH DIFFERENTIAL/PLATELET
BASO%: 0.4 % (ref 0.0–2.0)
BASOS ABS: 0 10*3/uL (ref 0.0–0.1)
EOS ABS: 0 10*3/uL (ref 0.0–0.5)
EOS%: 1.6 % (ref 0.0–7.0)
HEMATOCRIT: 36.5 % (ref 34.8–46.6)
HEMOGLOBIN: 13.1 g/dL (ref 11.6–15.9)
LYMPH#: 0.9 10*3/uL (ref 0.9–3.3)
LYMPH%: 35.4 % (ref 14.0–49.7)
MCH: 32.8 pg (ref 25.1–34.0)
MCHC: 35.9 g/dL (ref 31.5–36.0)
MCV: 91.5 fL (ref 79.5–101.0)
MONO#: 0.1 10*3/uL (ref 0.1–0.9)
MONO%: 4.1 % (ref 0.0–14.0)
NEUT%: 58.5 % (ref 38.4–76.8)
NEUTROS ABS: 1.4 10*3/uL — AB (ref 1.5–6.5)
PLATELETS: 140 10*3/uL — AB (ref 145–400)
RBC: 3.99 10*6/uL (ref 3.70–5.45)
RDW: 13.9 % (ref 11.2–14.5)
WBC: 2.5 10*3/uL — ABNORMAL LOW (ref 3.9–10.3)

## 2016-08-11 LAB — COMPREHENSIVE METABOLIC PANEL
ALBUMIN: 4 g/dL (ref 3.5–5.0)
ALK PHOS: 45 U/L (ref 40–150)
ALT: 12 U/L (ref 0–55)
AST: 10 U/L (ref 5–34)
Anion Gap: 10 mEq/L (ref 3–11)
BILIRUBIN TOTAL: 0.56 mg/dL (ref 0.20–1.20)
BUN: 15.7 mg/dL (ref 7.0–26.0)
CALCIUM: 9.5 mg/dL (ref 8.4–10.4)
CO2: 24 mEq/L (ref 22–29)
Chloride: 108 mEq/L (ref 98–109)
Creatinine: 0.9 mg/dL (ref 0.6–1.1)
EGFR: 71 mL/min/{1.73_m2} — AB (ref >90)
Glucose: 117 mg/dl (ref 70–140)
POTASSIUM: 3.7 meq/L (ref 3.5–5.1)
Sodium: 142 mEq/L (ref 136–145)
TOTAL PROTEIN: 7.3 g/dL (ref 6.4–8.3)

## 2016-08-11 MED ORDER — ZOLEDRONIC ACID 4 MG/100ML IV SOLN
4.0000 mg | Freq: Once | INTRAVENOUS | Status: AC
  Administered 2016-08-11: 4 mg via INTRAVENOUS
  Filled 2016-08-11: qty 100

## 2016-08-11 MED ORDER — HEPARIN SOD (PORK) LOCK FLUSH 100 UNIT/ML IV SOLN
500.0000 [IU] | Freq: Once | INTRAVENOUS | Status: AC | PRN
  Administered 2016-08-11: 500 [IU] via INTRAVENOUS
  Filled 2016-08-11: qty 5

## 2016-08-11 MED ORDER — SODIUM CHLORIDE 0.9 % IJ SOLN
10.0000 mL | INTRAMUSCULAR | Status: DC | PRN
  Administered 2016-08-11: 10 mL via INTRAVENOUS
  Filled 2016-08-11: qty 10

## 2016-08-11 NOTE — Telephone Encounter (Signed)
CT and Bone Scan scheduled for 10/16.  Pt given date, time and instructions for scan.  Pt verbalized understanding.

## 2016-08-11 NOTE — Assessment & Plan Note (Signed)
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 BRCA 2 mutation underwent salpingo-oophorectomy Status post bilateral mastectomies and radiation to left chest wall and axilla currently on tamoxifen since October 2012) Prior treatment: Halaven day 1 and day 8 every 3 weeks 6 cycles started 06/29/2015 and completed 10/19/2015 PET/CT scan 11/01/2015:Focal patchy LUL opacities new/increased possibly reflecting infection or tumor, mild improving hypermet LN in Rt paratracheal and left infrahilar region, subcut mets to right shoulder and right post gluteal, ext bone mets CT angiogram 12/19/2015: No PE slight interval worsening of consolidative opacities within the left upper lobe ------------------------------------------------------------------------------------------------------------------------------------------------------- Current treatment: letrozole with Ibrance along with Zometa started 11/12/2015  Ibrance toxicities: 1. Peripheral Neuropathy 2. Neutropenia: grade 1: Did not require dose reduction. 3. Hot flashes related to letrozole  4. Nausea: Takes Zofran when necessary  Shortness of breath: CT scans do not reveal any underlying cause for shortness, it appears to have improved significantly Speech difficulties: Seen a speech pathologist. Severe anxiety and insomnia: Currently seen a psychiatrist Dr.Jonnalagadda who has been of tremendous help to the patient.   Her blood count was reviewed  and ANC is   Bone metastases: Zometa Q monthly, calcium plus vitamin D, pain from the bone metastases improved with Roxicodone. It is also much improved than before.  Neck pain: Palliative radiation to the neck 02/16/2017only received 5 fractions, patient decided to discontinue Esophagitis: due to radiation. It has resolved  Current complaint: Low back pain as well as shaking of her body. Patient is very anxious that this may be a sign of disease recurrence. I discussed with her that this shaking of her body is most likely related to anxiety. Low back pain could be related to bone metastases but it caused severe related to osteoarthritis. Patient has poor appetite probably from anxiety. We discussed stress relaxation measures and I will refer her for massage therapy.

## 2016-08-11 NOTE — Progress Notes (Signed)
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 breast ([place])   12/07/2010 - 04/12/2011 Neo-Adjuvant Chemotherapy    Neoadjuvant FEC x6 followed by Taxotere x4      12/26/2010 Procedure    Genetic testing showed mutation for BRCA2 2041insA      05/12/2011 Surgery    Bilateral mastectomy with left axillary dissection 11/21 positive lymph nodes 5.8 cm tumor ER 95% PR 0% Ki-67 61% HER-2 negative ratio 1.39 T3, N3, 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      07/03/2011 - 08/17/2011 Radiation Therapy    Radiation therapy to the chest and axilla      09/11/2011 -  Anti-estrogen oral therapy    Tamoxifen later switched to Arimidex 07/2012      04/22/2012 Surgery    Bilateral salpingo-oophorectomy      06/10/2015 Imaging    Bone scan: uptake right lateral frontal bone, left malar region, manubrium, right 6,8 ribs; CT-CAP: New extensive infiltrate left & right hilar/mediastinal, right axillary, bil lower neck LN, 3.7 cm LUL consolidation, RML nodules,Lt Pl eff, RPLN      06/18/2015 Initial Biopsy    Right axillary lymph node biopsy: 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, ER 90%, PR 0%, Ki-67 60%, HER-2 negative      06/29/2015 - 10/19/2015 Chemotherapy    Halaven days 1 and 8 Q 3 weeks 6 cycles      11/01/2015 PET scan    Focal patchy LUL opacities new/increased possibly reflecting infection or tumor, mild improving hypermet LN in Rt paratracheal  and left infrahilar region, subcut mets to right shoulder and right post gluteal, ext bone mets      11/08/2015 -  Anti-estrogen oral therapy    Ibrance with Letrozole      12/30/2015 - 01/07/2016 Radiation Therapy    Palliative radiation to the neck (stopped early due to esophagitis)       CHIEF COMPLIANT: Increased tremors, myoclonus, low back pain, fatigue  INTERVAL HISTORY: Tina Vaughan is a 55 year old with above-mentioned history metastatic breast cancer currently on Ibrance with letrozole. She came in for an urgent visit because she is having increasing problems with low back pain and fatigue. She has days that she has excellent energy and she has other days where she does not have any energy. She is also noticing increased tremors and sometimes she feels that her legs have jerking moments. Because of all of these complaints she is here to see [name].  REVIEW OF SYSTEMS:   Constitutional: Denies fevers, chills or abnormal weight loss Eyes: Denies blurriness of vision Ears, nose, mouth, throat, and face: Denies mucositis or sore throat, speech difficulties Respiratory: Chronic shortness of breath Cardiovascular: Denies palpitation, chest discomfort Gastrointestinal:  Denies nausea, heartburn or change in bowel habits Skin: Denies abnormal skin rashes Lymphatics: Denies new lymphadenopathy or easy bruising Neurological:Denies numbness, tingling or new weaknesses, low back pain Behavioral/Psych: Mood is stable, no new changes , anxiety Extremities: No lower extremity edema  All  other systems were reviewed with the patient and are negative.  I have reviewed the past medical history, past surgical history, social history and family history with the patient and they are unchanged from previous note.  ALLERGIES:  is allergic to doxycycline hydrochloride.  MEDICATIONS:  Current Outpatient Prescriptions  Medication Sig Dispense Refill  . buPROPion (WELLBUTRIN XL) 300 MG 24 hr tablet  Take 300 mg by mouth daily.    . Calcium 200 MG TABS Take 200 mg by mouth daily.     . cholecalciferol (VITAMIN D) 1000 UNITS tablet Take 1,000 Units by mouth daily.     . citalopram (CELEXA) 40 MG tablet TAKE 1 TABLET BY MOUTH DAILY. 30 tablet 6  . dexamethasone (DECADRON) 4 MG tablet Take 0.5 tablets (2 mg total) by mouth daily. (Patient not taking: Reported on 12/30/2015) 30 tablet 1  . fluocinonide cream (LIDEX) 0.24 % Apply 1 application topically 2 (two) times daily as needed (for rash.).    Marland Kitchen gabapentin (NEURONTIN) 300 MG capsule Take 1 capsule (300 mg total) by mouth daily. (Patient taking differently: Take 300 mg by mouth 3 (three) times daily. ) 30 capsule 5  . ibuprofen (ADVIL,MOTRIN) 200 MG tablet Take 600 mg by mouth every 8 (eight) hours as needed for moderate pain (pain).     Marland Kitchen letrozole (FEMARA) 2.5 MG tablet TAKE 1 TABLET BY MOUTH DAILY. 90 tablet 0  . lidocaine-prilocaine (EMLA) cream Apply to affected area once (Patient taking differently: Apply 1 application topically daily as needed. For port) 30 g 3  . LORazepam (ATIVAN) 1 MG tablet Take 1 mg by mouth 2 (two) times daily.    . naproxen sodium (ANAPROX) 220 MG tablet Take 220-440 mg by mouth 2 (two) times daily as needed (for muscle aches.). Reported on 03/23/2016    . omeprazole (PRILOSEC) 20 MG capsule Take 1 capsule (20 mg total) by mouth daily. Take 2 x  30-60 min before first meal of the day 30 capsule 3  . ondansetron (ZOFRAN) 8 MG tablet TAKE 1 TABLET BY MOUTH 2 TIMES DAILY. START THE DAY AFTER CHEMO FOR 2 DAYS. THEN TAKE AS NEEDED FOR NAUSEA OR VOMITING. 30 tablet 1  . oxyCODONE (ROXICODONE) 15 MG immediate release tablet Take 1 tablet (15 mg total) by mouth every 4 (four) hours as needed. for pain 90 tablet 0  . oxyCODONE-acetaminophen (PERCOCET) 10-325 MG tablet Take 1-2 tablets q 4 hrs PRN pain. 30 tablet 0  . palbociclib (IBRANCE) 125 MG capsule Take 1 capsule (125 mg total) by mouth daily with breakfast. Take whole  with food for 21 days, then take 7 days off 21 capsule 6  . ranitidine (ZANTAC) 150 MG tablet 2 at bedtime (Patient not taking: Reported on 03/23/2016)    . sucralfate (CARAFATE) 1 g tablet Dissolve 1 tablet in 10 mL H20 and swallow 30 min prior to meals and bedtime. (Patient not taking: Reported on 03/23/2016) 30 tablet 5  . UNABLE TO FIND Dispense compression sleeve 20-30 mm for this patient with history of breast cancer s/p surgery (Patient not taking: Reported on 03/23/2016) 1 each 0  . zolpidem (AMBIEN) 10 MG tablet TAKE 1 TABLET BY MOUTH AT BEDTIME. (FILL 04/01/16) 30 tablet 2   No current facility-administered medications for this visit.    Facility-Administered Medications Ordered in Other Visits  Medication Dose Route Frequency Provider Last Rate Last Dose  . sodium chloride 0.9 % injection 10 mL  10 mL Intravenous PRN Nicholas Lose, MD  10 mL at 08/11/16 1137    PHYSICAL EXAMINATION: ECOG PERFORMANCE STATUS: 2 - Symptomatic, <50% confined to bed  Vitals:   08/11/16 0932  BP: 116/88  Pulse: (!) 109  Resp: 18  Temp: 98.6 F (37 C)   Filed Weights   08/11/16 0932  Weight: 213 lb 1.6 oz (96.7 kg)    GENERAL:alert, no distress and comfortable SKIN: skin color, texture, turgor are normal, no rashes or significant lesions EYES: normal, Conjunctiva are pink and non-injected, sclera clear OROPHARYNX:no exudate, no erythema and lips, buccal mucosa, and tongue normal  NECK: supple, thyroid normal size, non-tender, without nodularity LYMPH:  no palpable lymphadenopathy in the cervical, axillary or inguinal LUNGS: clear to auscultation and percussion with normal breathing effort HEART: regular rate & rhythm and no murmurs and no lower extremity edema ABDOMEN:abdomen soft, non-tender and normal bowel sounds MUSCULOSKELETAL:no cyanosis of digits and no clubbing  NEURO: alert & oriented x 3 with fluent speech, no focal motor/sensory deficits EXTREMITIES: No lower extremity  edema  LABORATORY DATA:  I have reviewed the data as listed   Chemistry      Component Value Date/Time   NA 142 08/11/2016 0917   K 3.7 08/11/2016 0917   CL 110 01/18/2016 2014   CL 108 (H) 07/30/2012 1430   CO2 24 08/11/2016 0917   BUN 15.7 08/11/2016 0917   CREATININE 0.9 08/11/2016 0917      Component Value Date/Time   CALCIUM 9.5 08/11/2016 0917   ALKPHOS 45 08/11/2016 0917   AST 10 08/11/2016 0917   ALT 12 08/11/2016 0917   BILITOT 0.56 08/11/2016 0917       Lab Results  Component Value Date   WBC 2.5 (L) 08/11/2016   HGB 13.1 08/11/2016   HCT 36.5 08/11/2016   MCV 91.5 08/11/2016   PLT 140 (L) 08/11/2016   NEUTROABS 1.4 (L) 08/11/2016     ASSESSMENT & PLAN:  Breast cancer of lower-outer quadrant of left female breast ([facility]) Right axillary lymph node biopsy 06/18/2015: 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 BRCA 2 mutation underwent salpingo-oophorectomy Status post bilateral mastectomies and radiation to left chest wall and axilla currently on tamoxifen since October 2012) Prior treatment: Halaven day 1 and day 8 every 3 weeks 6 cycles started 06/29/2015 and completed 10/19/2015 PET/CT scan 11/01/2015:Focal patchy LUL opacities new/increased possibly reflecting infection or tumor, mild improving hypermet LN in Rt paratracheal and left infrahilar region, subcut mets to right shoulder and right post gluteal, ext bone mets CT angiogram 12/19/2015: No PE slight interval worsening of consolidative opacities within  the left upper lobe ------------------------------------------------------------------------------------------------------------------------------------------------------- Current treatment: letrozole with Ibrance along with Zometa started 11/12/2015  Ibrance toxicities: 1. Peripheral Neuropathy 2. Neutropenia: grade 1: Did not require dose reduction. 3. Hot flashes related to letrozole  4. Nausea: Takes Zofran when necessary  Shortness of breath: CT scans do not reveal any underlying cause for shortness, it appears to have improved significantly Speech difficulties: Seen a speech pathologist. Severe anxiety and insomnia: Currently seeing psychiatrist Dr.Jonnalagadda who has been of tremendous help  to the patient.   Her blood count was reviewed and ANC is 1.4  Bone metastases: Zometa Q monthly, calcium plus vitamin D, pain from the bone metastases improved with Roxicodone. It is also much improved than before.  Neck pain: Palliative radiation to the neck 02/16/2017only received 5 fractions, patient decided to discontinue Esophagitis: due to radiation. It has resolved  Current complaint: Low back pain as well as shaking of her body. Patient is very anxious that this may be a sign of disease recurrence. I discussed with her that this shaking of her body is most likely related to anxiety. Low back pain could be related to bone metastases but it caused severe related to osteoarthritis. Patient has poor appetite probably from anxiety. We discussed stress relaxation measures   We will need to wait for the results of the scans before we make any decisions regarding progression. If she does have progression, she would be eligible to receive Olaparib.  No orders of the defined types were placed in this encounter.  The patient has a good understanding of the overall plan. she agrees with it. she will call with any problems that may develop before the next visit here.   Rulon Eisenmenger,  MD 08/11/16

## 2016-08-11 NOTE — Patient Instructions (Signed)

## 2016-08-14 DIAGNOSIS — F411 Generalized anxiety disorder: Secondary | ICD-10-CM | POA: Diagnosis not present

## 2016-08-14 DIAGNOSIS — F332 Major depressive disorder, recurrent severe without psychotic features: Secondary | ICD-10-CM | POA: Diagnosis not present

## 2016-08-28 ENCOUNTER — Ambulatory Visit (HOSPITAL_COMMUNITY): Payer: Medicare Other

## 2016-08-28 ENCOUNTER — Telehealth: Payer: Self-pay | Admitting: Medical Oncology

## 2016-08-28 ENCOUNTER — Encounter (HOSPITAL_COMMUNITY): Payer: Medicare Other

## 2016-08-28 NOTE — Telephone Encounter (Signed)
Pt called and has nausea and cannot drink contrast. She normally does not have a problem with contrast so ,  she thinks she has a GI virus and should be better in 1-2 days.  I called central scheduling- Manuela Schwartz -  to cancel scans today . Pt will call  back to central scheduling and r/s.  F/u still scheduled 10/24with [place]ud[place] and she know s to get scans before this visit.

## 2016-09-05 ENCOUNTER — Other Ambulatory Visit (HOSPITAL_BASED_OUTPATIENT_CLINIC_OR_DEPARTMENT_OTHER): Payer: Medicare Other

## 2016-09-05 ENCOUNTER — Other Ambulatory Visit: Payer: Self-pay | Admitting: Hematology and Oncology

## 2016-09-05 ENCOUNTER — Ambulatory Visit (HOSPITAL_BASED_OUTPATIENT_CLINIC_OR_DEPARTMENT_OTHER): Payer: Medicare Other | Admitting: Hematology and Oncology

## 2016-09-05 ENCOUNTER — Encounter: Payer: Self-pay | Admitting: Hematology and Oncology

## 2016-09-05 ENCOUNTER — Ambulatory Visit (HOSPITAL_BASED_OUTPATIENT_CLINIC_OR_DEPARTMENT_OTHER): Payer: Medicare Other

## 2016-09-05 VITALS — BP 116/78 | HR 102 | Temp 98.3°F | Resp 18 | Ht 62.0 in | Wt 218.6 lb

## 2016-09-05 DIAGNOSIS — C773 Secondary and unspecified malignant neoplasm of axilla and upper limb lymph nodes: Secondary | ICD-10-CM

## 2016-09-05 DIAGNOSIS — R740 Nonspecific elevation of levels of transaminase and lactic acid dehydrogenase [LDH]: Secondary | ICD-10-CM

## 2016-09-05 DIAGNOSIS — C50512 Malignant neoplasm of lower-outer quadrant of left female breast: Secondary | ICD-10-CM

## 2016-09-05 DIAGNOSIS — R479 Unspecified speech disturbances: Secondary | ICD-10-CM

## 2016-09-05 DIAGNOSIS — C7951 Secondary malignant neoplasm of bone: Secondary | ICD-10-CM | POA: Diagnosis not present

## 2016-09-05 DIAGNOSIS — G620 Drug-induced polyneuropathy: Secondary | ICD-10-CM

## 2016-09-05 DIAGNOSIS — Z171 Estrogen receptor negative status [ER-]: Secondary | ICD-10-CM

## 2016-09-05 DIAGNOSIS — R0602 Shortness of breath: Secondary | ICD-10-CM | POA: Diagnosis not present

## 2016-09-05 DIAGNOSIS — F411 Generalized anxiety disorder: Secondary | ICD-10-CM

## 2016-09-05 DIAGNOSIS — G4700 Insomnia, unspecified: Secondary | ICD-10-CM | POA: Diagnosis not present

## 2016-09-05 DIAGNOSIS — D702 Other drug-induced agranulocytosis: Secondary | ICD-10-CM

## 2016-09-05 DIAGNOSIS — Z170 Estrogen receptor positive status [ER+]: Secondary | ICD-10-CM

## 2016-09-05 DIAGNOSIS — C50919 Malignant neoplasm of unspecified site of unspecified female breast: Secondary | ICD-10-CM

## 2016-09-05 LAB — COMPREHENSIVE METABOLIC PANEL
ALK PHOS: 114 U/L (ref 40–150)
ALT: 513 U/L — AB (ref 0–55)
ANION GAP: 10 meq/L (ref 3–11)
AST: 425 U/L (ref 5–34)
Albumin: 3.6 g/dL (ref 3.5–5.0)
BILIRUBIN TOTAL: 1.91 mg/dL — AB (ref 0.20–1.20)
BUN: 12.4 mg/dL (ref 7.0–26.0)
CALCIUM: 8.9 mg/dL (ref 8.4–10.4)
CO2: 27 mEq/L (ref 22–29)
CREATININE: 0.8 mg/dL (ref 0.6–1.1)
Chloride: 106 mEq/L (ref 98–109)
EGFR: 82 mL/min/{1.73_m2} — ABNORMAL LOW (ref >90)
Glucose: 95 mg/dl (ref 70–140)
Potassium: 3.5 mEq/L (ref 3.5–5.1)
Sodium: 143 mEq/L (ref 136–145)
TOTAL PROTEIN: 6.8 g/dL (ref 6.4–8.3)

## 2016-09-05 LAB — CBC WITH DIFFERENTIAL/PLATELET
BASO%: 1.2 % (ref 0.0–2.0)
Basophils Absolute: 0 10*3/uL (ref 0.0–0.1)
EOS%: 2.4 % (ref 0.0–7.0)
Eosinophils Absolute: 0.1 10*3/uL (ref 0.0–0.5)
HEMATOCRIT: 36.2 % (ref 34.8–46.6)
HGB: 12.5 g/dL (ref 11.6–15.9)
LYMPH#: 0.6 10*3/uL — AB (ref 0.9–3.3)
LYMPH%: 28.2 % (ref 14.0–49.7)
MCH: 32.5 pg (ref 25.1–34.0)
MCHC: 34.5 g/dL (ref 31.5–36.0)
MCV: 94.3 fL (ref 79.5–101.0)
MONO#: 0.2 10*3/uL (ref 0.1–0.9)
MONO%: 9.2 % (ref 0.0–14.0)
NEUT%: 59 % (ref 38.4–76.8)
NEUTROS ABS: 1.3 10*3/uL — AB (ref 1.5–6.5)
PLATELETS: 207 10*3/uL (ref 145–400)
RBC: 3.83 10*6/uL (ref 3.70–5.45)
RDW: 15.3 % — ABNORMAL HIGH (ref 11.2–14.5)
WBC: 2.2 10*3/uL — ABNORMAL LOW (ref 3.9–10.3)

## 2016-09-05 MED ORDER — OXYCODONE HCL 15 MG PO TABS: 15.0000 mg | ORAL_TABLET | ORAL | 0 refills | Status: DC | PRN

## 2016-09-05 MED ORDER — CYANOCOBALAMIN 1000 MCG/ML IJ SOLN
1000.0000 ug | Freq: Once | INTRAMUSCULAR | Status: AC
  Administered 2016-09-05: 1000 ug via INTRAMUSCULAR

## 2016-09-05 MED ORDER — SODIUM CHLORIDE 0.9 % IJ SOLN
10.0000 mL | INTRAMUSCULAR | Status: DC | PRN
  Administered 2016-09-05: 10 mL
  Filled 2016-09-05: qty 10

## 2016-09-05 MED ORDER — SODIUM CHLORIDE 0.9 % IV SOLN
Freq: Once | INTRAVENOUS | Status: AC
  Administered 2016-09-05: 12:00:00 via INTRAVENOUS

## 2016-09-05 MED ORDER — FENTANYL 25 MCG/HR TD PT72: 25.0000 ug | MEDICATED_PATCH | TRANSDERMAL | 0 refills | Status: DC

## 2016-09-05 MED ORDER — CYANOCOBALAMIN 1000 MCG/ML IJ SOLN
INTRAMUSCULAR | Status: AC
  Filled 2016-09-05: qty 1

## 2016-09-05 MED ORDER — ZOLEDRONIC ACID 4 MG/100ML IV SOLN
4.0000 mg | Freq: Once | INTRAVENOUS | Status: AC
  Administered 2016-09-05: 4 mg via INTRAVENOUS
  Filled 2016-09-05: qty 100

## 2016-09-05 MED ORDER — HEPARIN SOD (PORK) LOCK FLUSH 100 UNIT/ML IV SOLN
500.0000 [IU] | Freq: Once | INTRAVENOUS | Status: AC | PRN
  Administered 2016-09-05: 500 [IU]
  Filled 2016-09-05: qty 5

## 2016-09-05 MED ORDER — ALTEPLASE 2 MG IJ SOLR
2.0000 mg | Freq: Once | INTRAMUSCULAR | Status: DC | PRN
  Filled 2016-09-05: qty 2

## 2016-09-05 NOTE — Patient Instructions (Signed)

## 2016-09-05 NOTE — Progress Notes (Signed)
Patient Care Team: Mackie Pai, PA-C as PCP - General (Physician Assistant) Thea Silversmith, MD as Consulting Physician (Radiation Oncology) Marcy Panning, MD as Consulting Physician (Hematology and Oncology) Nicholas Lose, MD as Consulting Physician (Hematology and Oncology)  DIAGNOSIS:  Encounter Diagnoses  Name Primary?  . Bone metastasis ([place]) Yes  . Malignant neoplasm of lower-outer quadrant of left breast of female, estrogen receptor positive ([place])   . 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      07/03/2011 - 08/17/2011 Radiation Therapy    Radiation therapy to the chest and axilla      09/11/2011 -  Anti-estrogen oral therapy    Tamoxifen later switched to Arimidex 07/2012      04/22/2012 Surgery    Bilateral salpingo-oophorectomy      06/10/2015 Imaging    Bone scan: uptake right lateral frontal bone, left malar region, manubrium, right 6,8 ribs; CT-CAP: New extensive infiltrate left & right hilar/mediastinal, right axillary, bil lower neck LN, 3.7 cm LUL consolidation, RML nodules,Lt Pl eff, RPLN      06/18/2015 Initial Biopsy    Right axillary lymph node biopsy: 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, ER 90%, PR 0%, Ki-67 60%, HER-2 negative      06/29/2015 - 10/19/2015 Chemotherapy    Halaven days 1 and 8 Q 3 weeks 6 cycles      11/01/2015 PET scan    Focal patchy LUL opacities  new/increased possibly reflecting infection or tumor, mild improving hypermet LN in Rt paratracheal and left infrahilar region, subcut mets to right shoulder and right post gluteal, ext bone mets      11/08/2015 -  Anti-estrogen oral therapy    Ibrance with Letrozole      12/30/2015 - 01/07/2016 Radiation Therapy    Palliative radiation to the neck (stopped early due to esophagitis)       CHIEF COMPLIANT: follow-up on Ibrance letrozole  INTERVAL HISTORY: Tina Vaughan is a 55 year old with above-mentioned history metastatic breast cancer currently on Ibrance letrozole. She is tolerating the treatment fairly well. But she is noticing increasing pain in her back. She is taking 2 tablets of oxycodone every time.She is running out of her pain medications. She is also short of breath to exertion. She complains of pain diffusely.  REVIEW OF SYSTEMS:   Constitutional: Denies fevers, chills or abnormal weight loss Eyes: Denies blurriness of vision Ears, nose, mouth, throat, and face: Denies mucositis or sore throat Respiratory: Denies cough, dyspnea or wheezes Cardiovascular: Denies palpitation, chest discomfort Gastrointestinal:  Denies nausea, heartburn or change in bowel habits Skin: Denies abnormal skin rashes Lymphatics: Denies new lymphadenopathy or easy bruising Neurological:Denies numbness, tingling or new weaknesses Behavioral/Psych: Mood is stable, no new changes  Extremities: No lower extremity edema  All other systems were reviewed with the patient and are negative.  I have reviewed the past medical history, past  surgical history, social history and family history with the patient and they are unchanged from previous note.  ALLERGIES:  is allergic to doxycycline hydrochloride.  MEDICATIONS:  Current Outpatient Prescriptions  Medication Sig Dispense Refill  . buPROPion (WELLBUTRIN XL) 300 MG 24 hr tablet Take 300 mg by mouth daily.    . Calcium 200 MG TABS Take 200 mg by mouth  daily.     . cholecalciferol (VITAMIN D) 1000 UNITS tablet Take 1,000 Units by mouth daily.     . citalopram (CELEXA) 40 MG tablet TAKE 1 TABLET BY MOUTH DAILY. 30 tablet 6  . dexamethasone (DECADRON) 4 MG tablet Take 0.5 tablets (2 mg total) by mouth daily. (Patient not taking: Reported on 12/30/2015) 30 tablet 1  . fentaNYL (DURAGESIC - DOSED MCG/HR) 25 MCG/HR patch Place 1 patch (25 mcg total) onto the skin every 3 (three) days. 10 patch 0  . fluocinonide cream (LIDEX) 9.62 % Apply 1 application topically 2 (two) times daily as needed (for rash.).    Marland Kitchen gabapentin (NEURONTIN) 300 MG capsule Take 1 capsule (300 mg total) by mouth daily. (Patient taking differently: Take 300 mg by mouth 3 (three) times daily. ) 30 capsule 5  . ibuprofen (ADVIL,MOTRIN) 200 MG tablet Take 600 mg by mouth every 8 (eight) hours as needed for moderate pain (pain).     Marland Kitchen letrozole (FEMARA) 2.5 MG tablet TAKE 1 TABLET BY MOUTH DAILY. 90 tablet 0  . lidocaine-prilocaine (EMLA) cream Apply to affected area once (Patient taking differently: Apply 1 application topically daily as needed. For port) 30 g 3  . LORazepam (ATIVAN) 1 MG tablet Take 1 mg by mouth 2 (two) times daily.    . naproxen sodium (ANAPROX) 220 MG tablet Take 220-440 mg by mouth 2 (two) times daily as needed (for muscle aches.). Reported on 03/23/2016    . omeprazole (PRILOSEC) 20 MG capsule Take 1 capsule (20 mg total) by mouth daily. Take 2 x  30-60 min before first meal of the day 30 capsule 3  . ondansetron (ZOFRAN) 8 MG tablet TAKE 1 TABLET BY MOUTH 2 TIMES DAILY. START THE DAY AFTER CHEMO FOR 2 DAYS. THEN TAKE AS NEEDED FOR NAUSEA OR VOMITING. 30 tablet 1  . oxyCODONE (ROXICODONE) 15 MG immediate release tablet Take 1 tablet (15 mg total) by mouth every 4 (four) hours as needed. for pain 90 tablet 0  . palbociclib (IBRANCE) 125 MG capsule Take 1 capsule (125 mg total) by mouth daily with breakfast. Take whole with food for 21 days, then take 7 days off 21  capsule 6  . ranitidine (ZANTAC) 150 MG tablet 2 at bedtime (Patient not taking: Reported on 03/23/2016)    . sucralfate (CARAFATE) 1 g tablet Dissolve 1 tablet in 10 mL H20 and swallow 30 min prior to meals and bedtime. (Patient not taking: Reported on 03/23/2016) 30 tablet 5  . UNABLE TO FIND Dispense compression sleeve 20-30 mm for this patient with history of breast cancer s/p surgery (Patient not taking: Reported on 03/23/2016) 1 each 0  . zolpidem (AMBIEN) 10 MG tablet TAKE 1 TABLET BY MOUTH AT BEDTIME. (FILL 04/01/16) 30 tablet 2   No current facility-administered medications for this visit.     PHYSICAL EXAMINATION: ECOG PERFORMANCE STATUS: 1 - Symptomatic but completely ambulatory  Vitals:   09/05/16 1052  BP: 116/78  Pulse: (!) 102  Resp: 18  Temp: 98.3 F (36.8 C)   Filed Weights   09/05/16 1052  Weight: 218  lb 9.6 oz (99.2 kg)    GENERAL:alert, no distress and comfortable SKIN: skin color, texture, turgor are normal, no rashes or significant lesions EYES: normal, Conjunctiva are pink and non-injected, sclera clear OROPHARYNX:no exudate, no erythema and lips, buccal mucosa, and tongue normal  NECK: supple, thyroid normal size, non-tender, without nodularity LYMPH:  no palpable lymphadenopathy in the cervical, axillary or inguinal LUNGS: clear to auscultation and percussion with normal breathing effort HEART: regular rate & rhythm and no murmurs and no lower extremity edema ABDOMEN:abdomen soft, non-tender and normal bowel sounds MUSCULOSKELETAL:no cyanosis of digits and no clubbing  NEURO: alert & oriented x 3 with fluent speech, no focal motor/sensory deficits EXTREMITIES: No lower extremity edema   LABORATORY DATA:  I have reviewed the data as listed   Chemistry      Component Value Date/Time   NA 143 09/05/2016 1036   K 3.5 09/05/2016 1036   CL 110 01/18/2016 2014   CL 108 (H) 07/30/2012 1430   CO2 27 09/05/2016 1036   BUN 12.4 09/05/2016 1036   CREATININE  0.8 09/05/2016 1036      Component Value Date/Time   CALCIUM 8.9 09/05/2016 1036   ALKPHOS 114 09/05/2016 1036   AST 425 (HH) 09/05/2016 1036   ALT 513 (HH) 09/05/2016 1036   BILITOT 1.91 (H) 09/05/2016 1036       Lab Results  Component Value Date   WBC 2.2 (L) 09/05/2016   HGB 12.5 09/05/2016   HCT 36.2 09/05/2016   MCV 94.3 09/05/2016   PLT 207 09/05/2016   NEUTROABS 1.3 (L) 09/05/2016     ASSESSMENT & PLAN:  Breast cancer of lower-outer quadrant of left female breast ([place]) Right axillary lymph node biopsy 06/18/2015: 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 BRCA 2 mutation underwent salpingo-oophorectomy Status post bilateral mastectomies and radiation to left chest wall and axilla currently on tamoxifen since October 2012) Prior treatment: Halaven day 1 and day 8 every 3 weeks 6 cycles started 06/29/2015 and completed 10/19/2015 PET/CT scan 11/01/2015:Focal patchy LUL opacities new/increased possibly reflecting infection or tumor, mild improving hypermet LN in Rt paratracheal and left infrahilar region, subcut mets to right shoulder and right post gluteal, ext bone mets CT angiogram 12/19/2015: No PE slight interval worsening of consolidative opacities within the left upper lobe ------------------------------------------------------------------------------------------------------------------------------------------------------- Current treatment: letrozole with Ibrance along with Zometa started 11/12/2015  Ibrance  toxicities: 1. Peripheral Neuropathy 2. Neutropenia: grade 1: Did not require dose reduction. 3. Hot flashes related to letrozole  4. Nausea: Takes Zofran when necessary  Shortness of breath: CT scans do not reveal any underlying cause for shortness, it appears to have improved significantly Speech difficulties: Seen a speech pathologist. Severe anxiety and insomnia: Currently seeing psychiatrist Dr.Jonnalagaddawho has been of tremendous help to the patient.  Severe fatigue: We will start administering B 12 injections once a month. Severe elevation of AST and ALT: I discussed with the patient that the differential is between metastatic disease versus drug effect. Leslee Home is not known to cause elevation of AST and ALT. We are trying  to get her scans done sooner. I would like to bring her back in on Thursday to recheck on the labs.  Her blood count was reviewed and ANC is 1.3  Bone metastases: Zometa Q monthly, calcium plus vitamin D, pain from the bone metastases improved with Roxicodone. It is also much improved than before.  Neck pain: Palliative radiation to the neck 02/16/2017only received 5 fractions, patient decided to discontinue Esophagitis: due to radiation. It has resolved  Low back pain. I gave her a prescription for fentanyl 25 g in addition to oxycodone.  We will need to wait for the results of the scans before we make any decisions regarding progression. If she does have progression, she would be eligible to receive Olaparib.  No orders of the defined types were placed in this encounter.  The patient has a good understanding of the overall plan. she agrees with it. she will call with any problems that may develop before the next visit here.   Rulon Eisenmenger, MD 09/05/16

## 2016-09-05 NOTE — Assessment & Plan Note (Signed)
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 BRCA 2 mutation underwent salpingo-oophorectomy Status post bilateral mastectomies and radiation to left chest wall and axilla currently on tamoxifen since October 2012) Prior treatment: [place] day 1 and day 8 every 3 weeks 6 cycles started 06/29/2015 and completed 10/19/2015 PET/CT scan 11/01/2015:Focal patchy LUL opacities new/increased possibly reflecting infection or tumor, mild improving hypermet LN in Rt paratracheal and left infrahilar region, subcut mets to right shoulder and right post gluteal, ext bone mets CT angiogram 12/19/2015: No PE slight interval worsening of consolidative opacities within the left upper lobe ------------------------------------------------------------------------------------------------------------------------------------------------------- Current treatment: letrozole with Ibrance along with Zometa started 11/12/2015  Ibrance toxicities: 1. Peripheral Neuropathy 2. Neutropenia: grade 1: Did not require dose reduction. 3. Hot flashes related to letrozole  4. Nausea: Takes Zofran when necessary  Shortness of breath: CT scans do not reveal any underlying cause for shortness, it appears to have improved significantly Speech difficulties: Seen a speech pathologist. Severe anxiety and insomnia: Currently seeing psychiatrist Dr.[email or web address] has been of tremendous help to the patient.   Her blood count was reviewed  and ANC is 1.4  Bone metastases: Zometa Q monthly, calcium plus vitamin D, pain from the bone metastases improved with Roxicodone. It is also much improved than before.  Neck pain: Palliative radiation to the neck 02/16/2017only received 5 fractions, patient decided to discontinue Esophagitis: due to radiation. It has resolved  Current complaint: Low back pain as well as shaking of her body. Patient is very anxious that this may be a sign of disease recurrence. I discussed with her that this shaking of her body is most likely related to anxiety. Low back pain could be related to bone metastases but it caused severe related to osteoarthritis. Patient has poor appetite probably from anxiety. We discussed stress relaxation measures   We will need to wait for the results of the scans before we make any decisions regarding progression. If she does have progression, she would be eligible to receive Olaparib.

## 2016-09-05 NOTE — Progress Notes (Signed)
Received critical AST/ALT from lab.  Reviewed with Dr. Lindi Adie.  No new orders at this time.

## 2016-09-07 ENCOUNTER — Telehealth: Payer: Self-pay

## 2016-09-07 ENCOUNTER — Other Ambulatory Visit (HOSPITAL_BASED_OUTPATIENT_CLINIC_OR_DEPARTMENT_OTHER): Payer: Medicare Other

## 2016-09-07 DIAGNOSIS — C50512 Malignant neoplasm of lower-outer quadrant of left female breast: Secondary | ICD-10-CM

## 2016-09-07 DIAGNOSIS — C50919 Malignant neoplasm of unspecified site of unspecified female breast: Secondary | ICD-10-CM

## 2016-09-07 LAB — CBC WITH DIFFERENTIAL/PLATELET
BASO%: 1.4 % (ref 0.0–2.0)
Basophils Absolute: 0 10*3/uL (ref 0.0–0.1)
EOS%: 0.9 % (ref 0.0–7.0)
Eosinophils Absolute: 0 10*3/uL (ref 0.0–0.5)
HCT: 34.9 % (ref 34.8–46.6)
HGB: 12.3 g/dL (ref 11.6–15.9)
LYMPH%: 25.2 % (ref 14.0–49.7)
MCH: 32.6 pg (ref 25.1–34.0)
MCHC: 35.2 g/dL (ref 31.5–36.0)
MCV: 92.6 fL (ref 79.5–101.0)
MONO#: 0.2 10*3/uL (ref 0.1–0.9)
MONO%: 6.9 % (ref 0.0–14.0)
NEUT#: 1.4 10*3/uL — ABNORMAL LOW (ref 1.5–6.5)
NEUT%: 65.6 % (ref 38.4–76.8)
Platelets: 139 10*3/uL — ABNORMAL LOW (ref 145–400)
RBC: 3.77 10*6/uL (ref 3.70–5.45)
RDW: 14.5 % (ref 11.2–14.5)
WBC: 2.2 10*3/uL — ABNORMAL LOW (ref 3.9–10.3)
lymph#: 0.6 10*3/uL — ABNORMAL LOW (ref 0.9–3.3)

## 2016-09-07 LAB — LACTATE DEHYDROGENASE: LDH: 278 U/L — ABNORMAL HIGH (ref 125–245)

## 2016-09-07 LAB — COMPREHENSIVE METABOLIC PANEL
ALT: 507 U/L — AB (ref 0–55)
ANION GAP: 7 meq/L (ref 3–11)
AST: 205 U/L — AB (ref 5–34)
Albumin: 3.5 g/dL (ref 3.5–5.0)
Alkaline Phosphatase: 128 U/L (ref 40–150)
BUN: 6.6 mg/dL — AB (ref 7.0–26.0)
CHLORIDE: 112 meq/L — AB (ref 98–109)
CO2: 24 mEq/L (ref 22–29)
CREATININE: 0.7 mg/dL (ref 0.6–1.1)
Calcium: 8.6 mg/dL (ref 8.4–10.4)
EGFR: >90 mL/min/{1.73_m2} (ref >90)
Glucose: 119 mg/dl (ref 70–140)
POTASSIUM: 3.5 meq/L (ref 3.5–5.1)
Sodium: 143 mEq/L (ref 136–145)
Total Bilirubin: 3.99 mg/dL (ref 0.20–1.20)
Total Protein: 7 g/dL (ref 6.4–8.3)

## 2016-09-07 NOTE — Telephone Encounter (Signed)
Received elevated liver enzymes from lab.  Results given to Dr. Lindi Adie for review.

## 2016-09-08 ENCOUNTER — Encounter (HOSPITAL_COMMUNITY): Payer: Self-pay | Admitting: Emergency Medicine

## 2016-09-08 ENCOUNTER — Inpatient Hospital Stay (HOSPITAL_COMMUNITY)
Admission: EM | Admit: 2016-09-08 | Discharge: 2016-09-22 | DRG: 444 | Disposition: A | Discharge status: Home / Self Care | Payer: Medicare Other | Attending: Internal Medicine | Admitting: Internal Medicine

## 2016-09-08 DIAGNOSIS — Z9013 Acquired absence of bilateral breasts and nipples: Secondary | ICD-10-CM

## 2016-09-08 DIAGNOSIS — Z171 Estrogen receptor negative status [ER-]: Secondary | ICD-10-CM

## 2016-09-08 DIAGNOSIS — K5903 Drug induced constipation: Secondary | ICD-10-CM | POA: Diagnosis not present

## 2016-09-08 DIAGNOSIS — K8050 Calculus of bile duct without cholangitis or cholecystitis without obstruction: Secondary | ICD-10-CM

## 2016-09-08 DIAGNOSIS — Z888 Allergy status to other drugs, medicaments and biological substances status: Secondary | ICD-10-CM

## 2016-09-08 DIAGNOSIS — K760 Fatty (change of) liver, not elsewhere classified: Secondary | ICD-10-CM | POA: Diagnosis not present

## 2016-09-08 DIAGNOSIS — R17 Unspecified jaundice: Secondary | ICD-10-CM

## 2016-09-08 DIAGNOSIS — K5900 Constipation, unspecified: Secondary | ICD-10-CM

## 2016-09-08 DIAGNOSIS — C7951 Secondary malignant neoplasm of bone: Secondary | ICD-10-CM | POA: Diagnosis not present

## 2016-09-08 DIAGNOSIS — R197 Diarrhea, unspecified: Secondary | ICD-10-CM | POA: Diagnosis present

## 2016-09-08 DIAGNOSIS — F329 Major depressive disorder, single episode, unspecified: Secondary | ICD-10-CM | POA: Diagnosis present

## 2016-09-08 DIAGNOSIS — K56609 Unspecified intestinal obstruction, unspecified as to partial versus complete obstruction: Secondary | ICD-10-CM

## 2016-09-08 DIAGNOSIS — G893 Neoplasm related pain (acute) (chronic): Secondary | ICD-10-CM

## 2016-09-08 DIAGNOSIS — D649 Anemia, unspecified: Secondary | ICD-10-CM | POA: Diagnosis present

## 2016-09-08 DIAGNOSIS — C7800 Secondary malignant neoplasm of unspecified lung: Secondary | ICD-10-CM | POA: Diagnosis not present

## 2016-09-08 DIAGNOSIS — C50512 Malignant neoplasm of lower-outer quadrant of left female breast: Secondary | ICD-10-CM | POA: Diagnosis present

## 2016-09-08 DIAGNOSIS — R109 Unspecified abdominal pain: Secondary | ICD-10-CM | POA: Diagnosis not present

## 2016-09-08 DIAGNOSIS — G8929 Other chronic pain: Secondary | ICD-10-CM | POA: Diagnosis present

## 2016-09-08 DIAGNOSIS — K831 Obstruction of bile duct: Principal | ICD-10-CM | POA: Diagnosis present

## 2016-09-08 DIAGNOSIS — Z66 Do not resuscitate: Secondary | ICD-10-CM | POA: Diagnosis present

## 2016-09-08 DIAGNOSIS — Z515 Encounter for palliative care: Secondary | ICD-10-CM

## 2016-09-08 DIAGNOSIS — Z803 Family history of malignant neoplasm of breast: Secondary | ICD-10-CM

## 2016-09-08 DIAGNOSIS — K859 Acute pancreatitis without necrosis or infection, unspecified: Secondary | ICD-10-CM | POA: Diagnosis not present

## 2016-09-08 DIAGNOSIS — K219 Gastro-esophageal reflux disease without esophagitis: Secondary | ICD-10-CM | POA: Diagnosis present

## 2016-09-08 DIAGNOSIS — Z881 Allergy status to other antibiotic agents status: Secondary | ICD-10-CM

## 2016-09-08 DIAGNOSIS — K7689 Other specified diseases of liver: Secondary | ICD-10-CM | POA: Diagnosis not present

## 2016-09-08 DIAGNOSIS — G4733 Obstructive sleep apnea (adult) (pediatric): Secondary | ICD-10-CM | POA: Diagnosis present

## 2016-09-08 DIAGNOSIS — R1013 Epigastric pain: Secondary | ICD-10-CM

## 2016-09-08 DIAGNOSIS — Z6839 Body mass index (BMI) 39.0-39.9, adult: Secondary | ICD-10-CM

## 2016-09-08 DIAGNOSIS — Z800 Family history of malignant neoplasm of digestive organs: Secondary | ICD-10-CM

## 2016-09-08 DIAGNOSIS — R1011 Right upper quadrant pain: Secondary | ICD-10-CM | POA: Diagnosis not present

## 2016-09-08 DIAGNOSIS — Z9221 Personal history of antineoplastic chemotherapy: Secondary | ICD-10-CM

## 2016-09-08 DIAGNOSIS — F411 Generalized anxiety disorder: Secondary | ICD-10-CM | POA: Diagnosis present

## 2016-09-08 DIAGNOSIS — Z90721 Acquired absence of ovaries, unilateral: Secondary | ICD-10-CM

## 2016-09-08 DIAGNOSIS — T402X5A Adverse effect of other opioids, initial encounter: Secondary | ICD-10-CM | POA: Diagnosis not present

## 2016-09-08 DIAGNOSIS — R945 Abnormal results of liver function studies: Secondary | ICD-10-CM | POA: Diagnosis present

## 2016-09-08 DIAGNOSIS — T85520A Displacement of bile duct prosthesis, initial encounter: Secondary | ICD-10-CM | POA: Diagnosis not present

## 2016-09-08 DIAGNOSIS — F32A Depression, unspecified: Secondary | ICD-10-CM | POA: Diagnosis present

## 2016-09-08 DIAGNOSIS — E669 Obesity, unspecified: Secondary | ICD-10-CM | POA: Diagnosis present

## 2016-09-08 DIAGNOSIS — Z170 Estrogen receptor positive status [ER+]: Secondary | ICD-10-CM

## 2016-09-08 DIAGNOSIS — Z79899 Other long term (current) drug therapy: Secondary | ICD-10-CM

## 2016-09-08 DIAGNOSIS — Z79811 Long term (current) use of aromatase inhibitors: Secondary | ICD-10-CM

## 2016-09-08 LAB — CBC WITH DIFFERENTIAL/PLATELET
Basophils Absolute: 0 10*3/uL (ref 0.0–0.1)
Basophils Relative: 1 %
EOS ABS: 0.1 10*3/uL (ref 0.0–0.7)
EOS PCT: 2 %
HCT: 35 % — ABNORMAL LOW (ref 36.0–46.0)
Hemoglobin: 12.4 g/dL (ref 12.0–15.0)
LYMPHS ABS: 0.7 10*3/uL (ref 0.7–4.0)
Lymphocytes Relative: 26 %
MCH: 32.3 pg (ref 26.0–34.0)
MCHC: 35.4 g/dL (ref 30.0–36.0)
MCV: 91.1 fL (ref 78.0–100.0)
MONO ABS: 0.3 10*3/uL (ref 0.1–1.0)
Monocytes Relative: 11 %
Neutro Abs: 1.8 10*3/uL (ref 1.7–7.7)
Neutrophils Relative %: 60 %
PLATELETS: 153 10*3/uL (ref 150–400)
RBC: 3.84 MIL/uL — AB (ref 3.87–5.11)
RDW: 14.5 % (ref 11.5–15.5)
WBC: 2.9 10*3/uL — AB (ref 4.0–10.5)

## 2016-09-08 LAB — URINALYSIS, ROUTINE W REFLEX MICROSCOPIC
Glucose, UA: NEGATIVE mg/dL
Ketones, ur: NEGATIVE mg/dL
Nitrite: POSITIVE — AB
PROTEIN: NEGATIVE mg/dL
Specific Gravity, Urine: 1.019 (ref 1.005–1.030)
pH: 6.5 (ref 5.0–8.0)

## 2016-09-08 LAB — PROTIME-INR
INR: 0.97
Prothrombin Time: 12.9 seconds (ref 11.4–15.2)

## 2016-09-08 LAB — COMPREHENSIVE METABOLIC PANEL
ALBUMIN: 4 g/dL (ref 3.5–5.0)
ALT: 311 U/L — AB (ref 14–54)
AST: 110 U/L — AB (ref 15–41)
Alkaline Phosphatase: 101 U/L (ref 38–126)
Anion gap: 7 (ref 5–15)
BILIRUBIN TOTAL: 5.1 mg/dL — AB (ref 0.3–1.2)
CO2: 24 mmol/L (ref 22–32)
CREATININE: 0.54 mg/dL (ref 0.44–1.00)
Calcium: 8.5 mg/dL — ABNORMAL LOW (ref 8.9–10.3)
Chloride: 110 mmol/L (ref 101–111)
GFR calc Af Amer: >60 mL/min (ref >60)
GLUCOSE: 99 mg/dL (ref 65–99)
Potassium: 3.3 mmol/L — ABNORMAL LOW (ref 3.5–5.1)
Sodium: 141 mmol/L (ref 135–145)
TOTAL PROTEIN: 6.9 g/dL (ref 6.5–8.1)

## 2016-09-08 LAB — URINE MICROSCOPIC-ADD ON

## 2016-09-08 LAB — LIPASE, BLOOD: Lipase: 26 U/L (ref 11–51)

## 2016-09-08 LAB — AMMONIA: AMMONIA: 46 umol/L — AB (ref 9–35)

## 2016-09-08 LAB — APTT: aPTT: 57 seconds — ABNORMAL HIGH (ref 24–36)

## 2016-09-08 MED ORDER — ZOLPIDEM TARTRATE 5 MG PO TABS
5.0000 mg | ORAL_TABLET | Freq: Every day | ORAL | Status: DC
  Administered 2016-09-09 – 2016-09-21 (×14): 5 mg via ORAL
  Filled 2016-09-08 (×14): qty 1

## 2016-09-08 MED ORDER — SODIUM CHLORIDE 0.9 % IV SOLN
INTRAVENOUS | Status: DC
  Administered 2016-09-09 – 2016-09-18 (×16): via INTRAVENOUS
  Administered 2016-09-18: 500 mL via INTRAVENOUS
  Administered 2016-09-19: 1000 mL via INTRAVENOUS
  Administered 2016-09-19 – 2016-09-20 (×3): via INTRAVENOUS
  Administered 2016-09-20: 1000 mL via INTRAVENOUS
  Administered 2016-09-20 – 2016-09-22 (×3): via INTRAVENOUS

## 2016-09-08 MED ORDER — LORAZEPAM 1 MG PO TABS
2.0000 mg | ORAL_TABLET | Freq: Every day | ORAL | Status: DC
  Administered 2016-09-09 – 2016-09-21 (×14): 2 mg via ORAL
  Filled 2016-09-08 (×14): qty 2

## 2016-09-08 MED ORDER — FLUOCINONIDE 0.05 % EX CREA
1.0000 "application " | TOPICAL_CREAM | Freq: Two times a day (BID) | CUTANEOUS | Status: DC | PRN
  Filled 2016-09-08: qty 30

## 2016-09-08 MED ORDER — SODIUM CHLORIDE 0.9 % IV BOLUS (SEPSIS)
1000.0000 mL | Freq: Once | INTRAVENOUS | Status: DC
Start: 2016-09-08 — End: 2016-09-08

## 2016-09-08 MED ORDER — IBUPROFEN 200 MG PO TABS: 600.0000 mg | ORAL_TABLET | Freq: Three times a day (TID) | ORAL | Status: DC | PRN

## 2016-09-08 MED ORDER — LIDOCAINE-PRILOCAINE 2.5-2.5 % EX CREA: 1.0000 "application " | TOPICAL_CREAM | CUTANEOUS | Status: DC | PRN

## 2016-09-08 MED ORDER — CALCIUM CARBONATE 1250 (500 CA) MG PO TABS
500.0000 mg | ORAL_TABLET | Freq: Every day | ORAL | Status: DC
  Administered 2016-09-09 – 2016-09-22 (×13): 500 mg via ORAL
  Filled 2016-09-08 (×13): qty 1

## 2016-09-08 MED ORDER — SODIUM CHLORIDE 0.9 % IV BOLUS (SEPSIS)
1000.0000 mL | Freq: Once | INTRAVENOUS | Status: AC
  Administered 2016-09-08: 1000 mL via INTRAVENOUS

## 2016-09-08 MED ORDER — BUPROPION HCL ER (XL) 300 MG PO TB24
300.0000 mg | ORAL_TABLET | Freq: Every day | ORAL | Status: DC
  Administered 2016-09-09 – 2016-09-22 (×13): 300 mg via ORAL
  Filled 2016-09-08: qty 2
  Filled 2016-09-08 (×3): qty 1
  Filled 2016-09-08 (×2): qty 2
  Filled 2016-09-08 (×2): qty 1
  Filled 2016-09-08: qty 2
  Filled 2016-09-08 (×4): qty 1

## 2016-09-08 MED ORDER — FENTANYL 25 MCG/HR TD PT72
25.0000 ug | MEDICATED_PATCH | TRANSDERMAL | Status: DC
  Administered 2016-09-11: 25 ug via TRANSDERMAL
  Filled 2016-09-08: qty 1

## 2016-09-08 MED ORDER — HYDROMORPHONE HCL 1 MG/ML IJ SOLN
1.0000 mg | INTRAMUSCULAR | Status: AC | PRN
  Administered 2016-09-09 (×2): 1 mg via INTRAVENOUS
  Filled 2016-09-08 (×2): qty 1

## 2016-09-08 MED ORDER — MORPHINE SULFATE (PF) 2 MG/ML IV SOLN
4.0000 mg | Freq: Once | INTRAVENOUS | Status: AC
  Administered 2016-09-08: 4 mg via INTRAVENOUS
  Filled 2016-09-08: qty 2

## 2016-09-08 MED ORDER — CITALOPRAM HYDROBROMIDE 10 MG PO TABS: 10.0000 mg | ORAL_TABLET | Freq: Every day | ORAL | Status: DC

## 2016-09-08 MED ORDER — ONDANSETRON HCL 4 MG/2ML IJ SOLN: 4.0000 mg | Freq: Three times a day (TID) | INTRAMUSCULAR | Status: AC | PRN

## 2016-09-08 MED ORDER — OXYCODONE HCL 5 MG PO TABS
15.0000 mg | ORAL_TABLET | ORAL | Status: DC | PRN
  Administered 2016-09-09 – 2016-09-14 (×19): 15 mg via ORAL
  Filled 2016-09-08 (×19): qty 3

## 2016-09-08 MED ORDER — GABAPENTIN 300 MG PO CAPS
300.0000 mg | ORAL_CAPSULE | Freq: Three times a day (TID) | ORAL | Status: DC
  Administered 2016-09-09 – 2016-09-22 (×38): 300 mg via ORAL
  Filled 2016-09-08 (×38): qty 1

## 2016-09-08 MED ORDER — VITAMIN D3 25 MCG (1000 UNIT) PO TABS
1000.0000 [IU] | ORAL_TABLET | Freq: Every day | ORAL | Status: DC
  Administered 2016-09-09 – 2016-09-22 (×13): 1000 [IU] via ORAL
  Filled 2016-09-08 (×13): qty 1

## 2016-09-08 MED ORDER — FOSFOMYCIN TROMETHAMINE 3 G PO PACK
3.0000 g | PACK | Freq: Once | ORAL | Status: AC
  Administered 2016-09-09: 3 g via ORAL
  Filled 2016-09-08: qty 3

## 2016-09-08 MED ORDER — ENOXAPARIN SODIUM 40 MG/0.4ML ~~LOC~~ SOLN
40.0000 mg | SUBCUTANEOUS | Status: DC
  Administered 2016-09-09 – 2016-09-12 (×4): 40 mg via SUBCUTANEOUS
  Filled 2016-09-08 (×5): qty 0.4

## 2016-09-08 MED ORDER — PANTOPRAZOLE SODIUM 40 MG PO TBEC
40.0000 mg | DELAYED_RELEASE_TABLET | Freq: Every day | ORAL | Status: DC
  Administered 2016-09-09 – 2016-09-22 (×13): 40 mg via ORAL
  Filled 2016-09-08 (×13): qty 1

## 2016-09-08 MED ORDER — LETROZOLE 2.5 MG PO TABS
2.5000 mg | ORAL_TABLET | Freq: Every day | ORAL | Status: DC
  Administered 2016-09-09 – 2016-09-22 (×13): 2.5 mg via ORAL
  Filled 2016-09-08 (×17): qty 1

## 2016-09-08 MED ORDER — PALBOCICLIB 125 MG PO CAPS: 125.0000 mg | ORAL_CAPSULE | Freq: Every day | ORAL | Status: DC

## 2016-09-08 MED ORDER — ONDANSETRON HCL 4 MG/2ML IJ SOLN
4.0000 mg | Freq: Once | INTRAMUSCULAR | Status: AC
  Administered 2016-09-08: 4 mg via INTRAVENOUS
  Filled 2016-09-08: qty 2

## 2016-09-08 MED ORDER — SODIUM CHLORIDE 0.9 % IV SOLN: INTRAVENOUS | Status: DC

## 2016-09-08 NOTE — H&P (Addendum)
History and Physical    Tina Vaughan [id number] DOB: 1961-06-13 DOA: 09/08/2016  Referring MD/NP/PA:   PCP: Mackie Pai, PA-C   Patient coming from:  The patient is coming from home.  At baseline, pt is partially dependent for most of ADL.   Chief Complaint: Abdominal pain  HPI: Tina Vaughan is a 55 y.o. female with medical history significant of metastatic breast cancer (s/p of surgery and currently on Ibrance and letrozole), GERD, depression, anxiety, OSA on CPAP, edema, who presents with abdominal pain.  Pt states that she has been having abdominal pain in the past 5 days, which is new. Her abdominal pain is located in the right upper quadrant, constant, 5 out of 10 in severity, radiating to the right back. She also has nausea, but no vomiting. She has been having diarrhea in the past 4 days. She states that she was on stool softener, but she continues to have diarrhea after she stopped stool softer 3 days ago. She has 5-7 bowel movements with loose stool each day. No fever or chills. Patient denies symptoms of UTI. She does not have chest pain, shortness of breath, cough, unilateral weakness.  ED Course: pt was found to have WBC 2.9, INR 0.97, lipase 26, positive urinalysis with small amount of leukocytes and positive nitrates, abnormal liver function with ALP 101, AST 110, ALT 311 and total bilirubin 5.1. Temperature 99.3, mildly tachycardia, O2 stat 93% on room air. Patient is placed on MedSurg bed for observation.   Review of Systems:   General: no fevers, chills, no changes in body weight, has poor appetite, has fatigue HEENT: no blurry vision, hearing changes or sore throat Respiratory: no dyspnea, coughing, wheezing CV: no chest pain, no palpitations GI: has nausea, abdominal pain, diarrhea, no constipation or vomiting, GU: no dysuria, burning on urination, increased urinary frequency, hematuria  Ext: no leg edema Neuro: no unilateral weakness, numbness, or tingling, no  vision change or hearing loss Skin: no rash, no skin tear. MSK: No muscle spasm, no deformity, no limitation of range of movement in spin Heme: No easy bruising.  Travel history: No recent long distant travel.  Allergy:  Allergies  Allergen Reactions  . Doxycycline Hydrochloride Nausea And Vomiting    Past Medical History:  Diagnosis Date  . Anxiety   . Breast cancer, stage 3 ([place]) 2012/ 05/2015   left; inflammatory  . Depression   . Eczema   . Frequent urination   . GERD (gastroesophageal reflux disease)   . Headache   . Lymphedema   . Neuromuscular disorder ([place])   . Neuropathy ([place])    due to left lymph node dissection  . Obesity   . Sleep apnea   . Thyroid disease    Possible would like to be evaluated    Past Surgical History:  Procedure Laterality Date  . LAPAROSCOPY  03/14/2012   Procedure: LAPAROSCOPY OPERATIVE;  Surgeon: Osborne Oman, MD;  Location: [facility];  Service: Gynecology;  Laterality: N/A;  . MASTECTOMY Bilateral 05/12/11   total mastectomy on right, radical mastectomy on left  . OOPHORECTOMY    . PORT-A-CATH REMOVAL  04/19/2012   Procedure: MINOR REMOVAL PORT-A-CATH;  Surgeon: Haywood Lasso, MD;  Location: [facility];  Service: General;  Laterality: N/A;  . Portacath     Placement   . PORTACATH PLACEMENT    . SALPINGOOPHORECTOMY  03/14/2012   Procedure: SALPINGO OOPHERECTOMY;  Surgeon: Osborne Oman, MD;  Location: [facility];  Service: Gynecology;  Laterality: Bilateral;    Social History:  reports that she has never smoked. She has never used smokeless tobacco. She reports that she drinks alcohol. She reports that she does not use drugs.  Family History:  Family History  Problem Relation Age of Onset  . Breast cancer Mother   . Pancreatic cancer Mother     pancreatic cancer x 2  . Diabetes Mother     after pancreas removed  . Stroke Father     heavy smoker  . Hypertension Father   . Heart disease Father   . Healthy Sister    . Migraines Sister   . Emphysema Paternal Grandfather     smoked a pipe  . Colon cancer Neg Hx   . Esophageal cancer Neg Hx   . Colitis Neg Hx   . Crohn's disease Neg Hx      Prior to Admission medications   Medication Sig Start Date End Date Taking? Authorizing Provider  buPROPion (WELLBUTRIN XL) 300 MG 24 hr tablet Take 300 mg by mouth daily.   Yes Historical Provider, MD  calcium carbonate (OSCAL) 1500 (600 Ca) MG TABS tablet Take 600 mg of elemental calcium by mouth daily.   Yes Historical Provider, MD  cholecalciferol (VITAMIN D) 1000 UNITS tablet Take 1,000 Units by mouth daily.    Yes Historical Provider, MD  citalopram (CELEXA) 40 MG tablet Take 10 mg by mouth daily.   Yes Historical Provider, MD  fentaNYL (DURAGESIC - DOSED MCG/HR) 25 MCG/HR patch Place 1 patch (25 mcg total) onto the skin every 3 (three) days. 09/05/16  Yes Nicholas Lose, MD  fluocinonide cream (LIDEX) [id number] % Apply 1 application topically 2 (two) times daily as needed (for rash.).   Yes Historical Provider, MD  gabapentin (NEURONTIN) 300 MG capsule Take 300 mg by mouth 3 (three) times daily.   Yes Historical Provider, MD  ibuprofen (ADVIL,MOTRIN) 200 MG tablet Take 600 mg by mouth every 8 (eight) hours as needed for mild pain or moderate pain.    Yes Historical Provider, MD  letrozole (FEMARA) 2.5 MG tablet Take 2.5 mg by mouth daily.   Yes Historical Provider, MD  lidocaine-prilocaine (EMLA) cream Apply 1 application topically as needed (prior to accessing port).   Yes Historical Provider, MD  LORazepam (ATIVAN) 1 MG tablet Take 1-2 mg by mouth 2 (two) times daily. Pt takes one tablet in the morning and two at bedtime.   Yes Historical Provider, MD  naproxen sodium (ANAPROX) 220 MG tablet Take 220-440 mg by mouth 2 (two) times daily as needed (for pain).    Yes Historical Provider, MD  omeprazole (PRILOSEC) 20 MG capsule Take 20 mg by mouth daily.   Yes Historical Provider, MD  ondansetron (ZOFRAN) 8 MG tablet  Take 8 mg by mouth every 8 (eight) hours as needed for nausea or vomiting.   Yes Historical Provider, MD  oxyCODONE (ROXICODONE) 15 MG immediate release tablet Take 1 tablet (15 mg total) by mouth every 4 (four) hours as needed. for pain 09/05/16  Yes Nicholas Lose, MD  palbociclib [facility]) 125 MG capsule Take 1 capsule (125 mg total) by mouth daily with breakfast. Take whole with food for 21 days, then take 7 days off 05/26/16  Yes Nicholas Lose, MD  zolpidem (AMBIEN) 10 MG tablet Take 10 mg by mouth at bedtime.   Yes Historical Provider, MD    Physical Exam: Vitals:   09/08/16 1842 09/08/16 1843 09/08/16 2046 09/08/16 2216  BP: (!) 141/102  135/97 140/97  Pulse: 115  94 93  Resp: 16  20 18   Temp: 99.3 F (37.4 C)     TempSrc: Oral     SpO2: 100%  97% 93%  Weight:  98.9 kg (218 lb)    Height:  5\' 2"  (1.575 m)     General: Not in acute distress HEENT:       Eyes: PERRL, EOMI, no scleral icterus.       ENT: No discharge from the ears and nose, no pharynx injection, no tonsillar enlargement.        Neck: No JVD, no bruit, no mass felt. Heme: No neck lymph node enlargement. Cardiac: S1/S2, RRR, No murmurs, No gallops or rubs. Respiratory:  No rales, wheezing, rhonchi or rubs. GI: Soft, nondistended, has tenderness over RUQ, no rebound pain, no organomegaly, BS present. GU: No hematuria Ext: No pitting leg edema bilaterally. 2+DP/PT pulse bilaterally. Musculoskeletal: No joint deformities, No joint redness or warmth, no limitation of ROM in spin. Skin: No rashes.  Neuro: Alert, oriented X3, cranial nerves II-XII grossly intact, moves all extremities normally Psych: Patient is not psychotic, no suicidal or hemocidal ideation.  Labs on Admission: I have personally reviewed following labs and imaging studies  CBC:  Recent Labs Lab 09/05/16 1036 09/07/16 0951 09/08/16 2147  WBC 2.2* 2.2* 2.9*  NEUTROABS 1.3* 1.4* 1.8  HGB 12.5 12.3 12.4  HCT 36.2 34.9 35.0*  MCV 94.3 92.6 91.1    PLT 207 139* 0000000   Basic Metabolic Panel:  Recent Labs Lab 09/05/16 1036 09/07/16 0951 09/08/16 2147  NA 143 143 141  K 3.5 3.5 3.3*  CL  --   --  110  CO2 27 24 24   GLUCOSE 95 119 99  BUN 12.4 6.6* <5*  CREATININE 0.8 0.7 0.54  CALCIUM 8.9 8.6 8.5*   GFR: Estimated Creatinine Clearance: 88.3 mL/min (by C-G formula based on SCr of 0.54 mg/dL). Liver Function Tests:  Recent Labs Lab 09/05/16 1036 09/07/16 0951 09/08/16 2147  AST 425* 205* 110*  ALT 513* 507* 311*  ALKPHOS 114 128 101  BILITOT 1.91* 3.99* 5.1*  PROT 6.8 7.0 6.9  ALBUMIN 3.6 3.5 4.0    Recent Labs Lab 09/08/16 2147  LIPASE 26    Recent Labs Lab 09/08/16 2147  AMMONIA 46*   Coagulation Profile:  Recent Labs Lab 09/08/16 2147  INR 0.97   Cardiac Enzymes: No results for input(s): CKTOTAL, CKMB, CKMBINDEX, TROPONINI in the last 168 hours. BNP (last 3 results) No results for input(s): PROBNP in the last 8760 hours. HbA1C: No results for input(s): HGBA1C in the last 72 hours. CBG: No results for input(s): GLUCAP in the last 168 hours. Lipid Profile: No results for input(s): CHOL, HDL, LDLCALC, TRIG, CHOLHDL, LDLDIRECT in the last 72 hours. Thyroid Function Tests: No results for input(s): TSH, T4TOTAL, FREET4, T3FREE, THYROIDAB in the last 72 hours. Anemia Panel: No results for input(s): VITAMINB12, FOLATE, FERRITIN, TIBC, IRON, RETICCTPCT in the last 72 hours. Urine analysis:    Component Value Date/Time   COLORURINE ORANGE (A) 09/08/2016 2000   APPEARANCEUR CLEAR 09/08/2016 2000   LABSPEC 1.019 09/08/2016 2000   LABSPEC 1.015 01/18/2011 1144   PHURINE 6.5 09/08/2016 2000   GLUCOSEU NEGATIVE 09/08/2016 2000   HGBUR TRACE (A) 09/08/2016 2000   BILIRUBINUR LARGE (A) 09/08/2016 2000   BILIRUBINUR 1 06/04/2015 1122   BILIRUBINUR Negative 01/18/2011 Early 09/08/2016 2000   PROTEINUR NEGATIVE 09/08/2016  2000   UROBILINOGEN 0.2 06/04/2015 2235   NITRITE POSITIVE  (A) 09/08/2016 2000   LEUKOCYTESUR SMALL (A) 09/08/2016 2000   LEUKOCYTESUR Trace 01/18/2011 1144   Sepsis Labs: @LABRCNTIP (procalcitonin:4,lacticidven:4) )No results found for this or any previous visit (from the past 240 hour(s)).   Radiological Exams on Admission: No results found.   EKG: Not done in ED, will get one.   Assessment/Plan Principal Problem:   Abdominal pain Active Problems:   Breast cancer of lower-outer quadrant of left female breast (HCC)   Generalized anxiety disorder   Bone metastasis (HCC)   Depression   Abnormal liver function   Diarrhea   Abdominal pain and Abnormal liver function: ALP 101, AST 110, ALT 311 and total bilirubin 5.1. It is likely that the patient has metastasized disease to liver, causing abnormal liver function and abdominal pain. Lipase normal.   -will place on med-surge bed for obs -get Ct-abd/pelvis with contrast -hepatitis panel -Pain control:  Roxicodone, fentanyl patch and when necessary Dilaudid -prn Zofran for nausea  Addendum: CT-abdomen/pelvis showed fatty infiltration of the liver, but no definitive focal hepatic mass. No biliary duct dilation. -will get UA-abd for further evaluation of biliary system  Breast cancer of lower-outer quadrant of left female breast [facility]): s/p of bilateral mastectomy. Has been followed up by Dr. Lindi Adie, last seen was on 10/12/18/15. Currently on Ibrance letrozole -will continue home Ibrance letrozole -please inform Dr. Lindi Adie of pt's admission in AM.  Depression and anxiety: Stable, no suicidal or homicidal ideations. -Continue home medications: Wellbutrin, Celexa and Ativan  Diarrhea: May be related to chemotherapy, but need to rule out other possibilities, such as C. difficile colitis -C. difficile PCR and GI path panel -IV fluids: 1 L normal saline,  then100 mL per hour  Positive urinalysis: Urinalysis with small amount of leukocyte and positive nitrate. Patient is asymptomatic, but given  neutropenia, will treat patient empirically -Fosfomycin 3 g 1 -Follow-up urine culture and blood culture   DVT ppx: SQ Lovenox Code Status: DNR Family Communication: Yes, patient's sister and 3 friends at bed side Disposition Plan:  Anticipate discharge back to previous home environment Consults called: none  Admission status: medical floor/obs  Date of Service 09/09/2016    Ivor Costa [facility] Pager [phone]  If 7PM-7AM, please contact night-coverage [email or web address] Password [email or web address] 09/09/2016, 12:05 AM

## 2016-09-08 NOTE — ED Notes (Signed)
Nurse in to get labs.

## 2016-09-08 NOTE — ED Triage Notes (Signed)
Patient reports hx of breast cancer which has progressed into lymph nodes. Patient reports feeling bad x 2 weeks.  Patient reports she believes cancer has moved to her liver and office was supposed to call her to perform another test but they never called.  Patient reports right sided abdominal pain with nausea.  States she has bright orange urine and "poop looks like oatmeal".  Reports feeling bloated.  Currently taking oral chemo daily (3weeks on 1 week off).

## 2016-09-08 NOTE — ED Provider Notes (Signed)
[facility] DEPT Provider Note   CSN: [id number] Arrival date & time: 09/08/16  1833     History   Chief Complaint Chief Complaint  Patient presents with  . Abdominal Pain  . Cancer    HPI Tina Vaughan is a 55 y.o. female.  HPI   Tina Vaughan is a 55 y.o. female, with a history of Metastatic breast cancer, anxiety, and thyroid disorder, presenting to the ED with onset of RUQ abdominal pain beginning about a week ago. Accompanied by decreased appetite with feelings of bloating. Nausea and diarrhea. Change in BMs. BM are almost white and have a texture of oatmeal for the last few days. Urine began to turn orange last week. Patient is having difficulty controlling her pain with the fentanyl patch and Roxicodone. Pain is currently severe and described as a sharp pressure. Pt confirms her DNR status with me. Last saw oncologist, Dr. Lindi Adie, on Oct 24.   Currently taking oral chemo in the form of Ibrance.  Patient's friends and support system at the bedside: [facility] Luna Fuse [phone]   Past Medical History:  Diagnosis Date  . Anxiety   . Breast cancer, stage 3 ([place]) 2012/ 05/2015   left; inflammatory  . Depression   . Eczema   . Frequent urination   . GERD (gastroesophageal reflux disease)   . Headache   . Lymphedema   . Neuromuscular disorder ([place])   . 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 Obesity   . Sleep apnea   . Thyroid disease    Possible would like to be evaluated    Patient Active Problem List   Diagnosis Date Noted  . Depression 09/08/2016  . Abnormal liver function 09/08/2016  . Abdominal pain 09/08/2016  . Diarrhea 09/08/2016  . Speech impairment 03/09/2016  . Encounter for chemotherapy management 11/10/2015  . Bone metastasis ([place]) 11/02/2015  . Morbid obesity ([place]) 08/28/2015  . Shortness of breath 08/04/2015  . Dyspnea 08/04/2015  . Tachycardia 08/04/2015  . Hyperventilation  08/04/2015  . Respiratory rate increased 08/04/2015  . Chemotherapy induced nausea and vomiting 07/13/2015  . Nausea without vomiting 06/08/2015  . Special screening for malignant neoplasms, colon 06/08/2015  . Bloating 06/08/2015  . Early satiety 06/08/2015  . Pain in the abdomen 06/04/2015  . Generalized anxiety disorder 05/21/2015  . Fatigue 05/21/2015  . Sleep apnea 05/21/2015  . Headache 10/22/2014  . Lymphedema of upper extremity 09/11/2014  . Status post prophylactic bilateral salpingo-oophorectomy for BRCA 03/19/2012  . Breast cancer of lower-outer quadrant of left female breast ([place]) 05/18/2011    Past Surgical History:  Procedure Laterality Date  . LAPAROSCOPY  03/14/2012   Procedure: LAPAROSCOPY OPERATIVE;  Surgeon: Osborne Oman, MD;  Location: [facility];  Service: Gynecology;  Laterality: N/A;  . MASTECTOMY Bilateral 05/12/11   total mastectomy on right, radical mastectomy on left  . OOPHORECTOMY    . PORT-A-CATH REMOVAL  04/19/2012   Procedure: MINOR REMOVAL PORT-A-CATH;  Surgeon: Haywood Lasso, MD;  Location: [facility];  Service: General;  Laterality: N/A;  . Portacath     Placement   . PORTACATH PLACEMENT    . SALPINGOOPHORECTOMY  03/14/2012   Procedure: SALPINGO OOPHERECTOMY;  Surgeon: Osborne Oman, MD;  Location: [facility];  Service: Gynecology;  Laterality: Bilateral;    OB History    Gravida Para Term Preterm AB Living   1  1     SAB TAB Ectopic Multiple Live Births     1             Home Medications    Prior to Admission medications   Medication Sig Start Date End Date Taking? Authorizing Provider  buPROPion (WELLBUTRIN XL) 300 MG 24 hr tablet Take 300 mg by mouth daily.   Yes Historical Provider, MD  calcium carbonate (OSCAL) 1500 (600 Ca) MG TABS tablet Take 600 mg of elemental calcium by mouth daily.   Yes Historical Provider, MD  cholecalciferol (VITAMIN D) 1000 UNITS tablet Take 1,000 Units by mouth daily.    Yes Historical  Provider, MD  citalopram (CELEXA) 40 MG tablet Take 10 mg by mouth daily.   Yes Historical Provider, MD  fentaNYL (DURAGESIC - DOSED MCG/HR) 25 MCG/HR patch Place 1 patch (25 mcg total) onto the skin every 3 (three) days. 09/05/16  Yes Nicholas Lose, MD  fluocinonide cream (LIDEX) 5.05 % Apply 1 application topically 2 (two) times daily as needed (for rash.).   Yes Historical Provider, MD  gabapentin (NEURONTIN) 300 MG capsule Take 300 mg by mouth 3 (three) times daily.   Yes Historical Provider, MD  ibuprofen (ADVIL,MOTRIN) 200 MG tablet Take 600 mg by mouth every 8 (eight) hours as needed for mild pain or moderate pain.    Yes Historical Provider, MD  letrozole (FEMARA) 2.5 MG tablet Take 2.5 mg by mouth daily.   Yes Historical Provider, MD  lidocaine-prilocaine (EMLA) cream Apply 1 application topically as needed (prior to accessing port).   Yes Historical Provider, MD  LORazepam (ATIVAN) 1 MG tablet Take 1-2 mg by mouth 2 (two) times daily. Pt takes one tablet in the morning and two at bedtime.   Yes Historical Provider, MD  naproxen sodium (ANAPROX) 220 MG tablet Take 220-440 mg by mouth 2 (two) times daily as needed (for pain).    Yes Historical Provider, MD  omeprazole (PRILOSEC) 20 MG capsule Take 20 mg by mouth daily.   Yes Historical Provider, MD  ondansetron (ZOFRAN) 8 MG tablet Take 8 mg by mouth every 8 (eight) hours as needed for nausea or vomiting.   Yes Historical Provider, MD  oxyCODONE (ROXICODONE) 15 MG immediate release tablet Take 1 tablet (15 mg total) by mouth every 4 (four) hours as needed. for pain 09/05/16  Yes Nicholas Lose, MD  palbociclib [facility]) 125 MG capsule Take 1 capsule (125 mg total) by mouth daily with breakfast. Take whole with food for 21 days, then take 7 days off 05/26/16  Yes Nicholas Lose, MD  zolpidem (AMBIEN) 10 MG tablet Take 10 mg by mouth at bedtime.   Yes Historical Provider, MD    Family History Family History  Problem Relation Age of Onset  .  Breast cancer Mother   . Pancreatic cancer Mother     pancreatic cancer x 2  . Diabetes Mother     after pancreas removed  . Stroke Father     heavy smoker  . Hypertension Father   . Heart disease Father   . Healthy Sister   . Migraines Sister   . Emphysema Paternal Grandfather     smoked a pipe  . Colon cancer Neg Hx   . Esophageal cancer Neg Hx   . Colitis Neg Hx   . Crohn's disease Neg Hx     Social History Social History  Substance Use Topics  . Smoking status: Never Smoker  . Smokeless tobacco: Never Used  .  Alcohol use 0.0 oz/week     Comment: rare     Allergies   Doxycycline hydrochloride   Review of Systems Review of Systems  Constitutional: Negative for appetite change, chills and fever.  Gastrointestinal: Positive for abdominal pain, diarrhea and nausea. Negative for vomiting.  Genitourinary:       Orange urine  All other systems reviewed and are negative.    Physical Exam Updated Vital Signs BP (!) 141/102 (BP Location: Right Arm)   Pulse 115   Temp 99.3 F (37.4 C) (Oral)   Resp 16   Ht _0  (1.575 m)   Wt 98.9 kg   LMP 05/08/2016   SpO2 100%   BMI 39.87 kg/m   Physical Exam  Constitutional: She appears well-developed and well-nourished. No distress.  HENT:  Head: Normocephalic and atraumatic.  Eyes: Conjunctivae are normal.  Neck: Neck supple.  Cardiovascular: Normal rate, regular rhythm, normal heart sounds and intact distal pulses.   Pulmonary/Chest: Effort normal and breath sounds normal. No respiratory distress.  Abdominal: Soft. There is tenderness in the right upper quadrant. There is no guarding.  Musculoskeletal: She exhibits no edema or tenderness.  Lymphadenopathy:    She has no cervical adenopathy.  Neurological: She is alert.  Skin: Skin is warm and dry. She is not diaphoretic.  Psychiatric: She has a normal mood and affect. Her behavior is normal.  Nursing note and vitals reviewed.    ED Treatments / Results   Labs (all labs ordered are listed, but only abnormal results are displayed) Labs Reviewed  URINALYSIS, ROUTINE W REFLEX MICROSCOPIC (NOT AT [facility]) - Abnormal; Notable for the following:       Result Value   Color, Urine ORANGE (*)    Hgb urine dipstick TRACE (*)    Bilirubin Urine LARGE (*)    Nitrite POSITIVE (*)    Leukocytes, UA SMALL (*)    All other components within normal limits  AMMONIA - Abnormal; Notable for the following:    Ammonia 46 (*)    All other components within normal limits  APTT - Abnormal; Notable for the following:    aPTT 57 (*)    All other components within normal limits  COMPREHENSIVE METABOLIC PANEL - Abnormal; Notable for the following:    Potassium 3.3 (*)    BUN <5 (*)    Calcium 8.5 (*)    AST 110 (*)    ALT 311 (*)    Total Bilirubin 5.1 (*)    All other components within normal limits  CBC WITH DIFFERENTIAL/PLATELET - Abnormal; Notable for the following:    WBC 2.9 (*)    RBC 3.84 (*)    HCT 35.0 (*)    All other components within normal limits  URINE MICROSCOPIC-ADD ON - Abnormal; Notable for the following:    Squamous Epithelial / LPF 0-5 (*)    Bacteria, UA RARE (*)    All other components within normal limits  URINE CULTURE  C DIFFICILE QUICK SCREEN W PCR REFLEX  GASTROINTESTINAL PANEL BY PCR, STOOL (REPLACES STOOL CULTURE)  LIPASE, BLOOD  PROTIME-INR  HEPATITIS PANEL, ACUTE  LACTIC ACID, PLASMA  COMPREHENSIVE METABOLIC PANEL  CBC   Results for orders placed or performed during the hospital encounter of 09/08/16  Urinalysis, Routine w reflex microscopic  Result Value Ref Range   Color, Urine ORANGE (A) YELLOW   APPearance CLEAR CLEAR   Specific Gravity, Urine 1.019 1.005 - 1.030   pH 6.5 5.0 - 8.0  Glucose, UA NEGATIVE NEGATIVE mg/dL   Hgb urine dipstick TRACE (A) NEGATIVE   Bilirubin Urine LARGE (A) NEGATIVE   Ketones, ur NEGATIVE NEGATIVE mg/dL   Protein, ur NEGATIVE NEGATIVE mg/dL   Nitrite POSITIVE (A) NEGATIVE    Leukocytes, UA SMALL (A) NEGATIVE  Ammonia  Result Value Ref Range   Ammonia 46 (H) 9 - 35 umol/L  Lipase, blood  Result Value Ref Range   Lipase 26 11 - 51 U/L  Protime-INR  Result Value Ref Range   Prothrombin Time 12.9 11.4 - 15.2 seconds   INR 0.97   APTT  Result Value Ref Range   aPTT 57 (H) 24 - 36 seconds  Comprehensive metabolic panel  Result Value Ref Range   Sodium 141 135 - 145 mmol/L   Potassium 3.3 (L) 3.5 - 5.1 mmol/L   Chloride 110 101 - 111 mmol/L   CO2 24 22 - 32 mmol/L   Glucose, Bld 99 65 - 99 mg/dL   BUN <5 (L) 6 - 20 mg/dL   Creatinine, Ser 0.54 0.44 - 1.00 mg/dL   Calcium 8.5 (L) 8.9 - 10.3 mg/dL   Total Protein 6.9 6.5 - 8.1 g/dL   Albumin 4.0 3.5 - 5.0 g/dL   AST 110 (H) 15 - 41 U/L   ALT 311 (H) 14 - 54 U/L   Alkaline Phosphatase 101 38 - 126 U/L   Total Bilirubin 5.1 (H) 0.3 - 1.2 mg/dL   GFR calc non Af Amer >60 >60 mL/min   GFR calc Af Amer >60 >60 mL/min   Anion gap 7 5 - 15  CBC with Differential  Result Value Ref Range   WBC 2.9 (L) 4.0 - 10.5 K/uL   RBC 3.84 (L) 3.87 - 5.11 MIL/uL   Hemoglobin 12.4 12.0 - 15.0 g/dL   HCT 35.0 (L) 36.0 - 46.0 %   MCV 91.1 78.0 - 100.0 fL   MCH 32.3 26.0 - 34.0 pg   MCHC 35.4 30.0 - 36.0 g/dL   RDW 14.5 11.5 - 15.5 %   Platelets 153 150 - 400 K/uL   Neutrophils Relative % 60 %   Neutro Abs 1.8 1.7 - 7.7 K/uL   Lymphocytes Relative 26 %   Lymphs Abs 0.7 0.7 - 4.0 K/uL   Monocytes Relative 11 %   Monocytes Absolute 0.3 0.1 - 1.0 K/uL   Eosinophils Relative 2 %   Eosinophils Absolute 0.1 0.0 - 0.7 K/uL   Basophils Relative 1 %   Basophils Absolute 0.0 0.0 - 0.1 K/uL  Urine microscopic-add on  Result Value Ref Range   Squamous Epithelial / LPF 0-5 (A) NONE SEEN   WBC, UA 0-5 0 - 5 WBC/hpf   RBC / HPF 0-5 0 - 5 RBC/hpf   Bacteria, UA RARE (A) NONE SEEN   Urine-Other MUCOUS PRESENT    No results found.   EKG  EKG Interpretation None       Radiology No results  found.  Procedures Procedures (including critical care time)  Medications Ordered in ED Medications  HYDROmorphone (DILAUDID) injection 1 mg (not administered)  ondansetron (ZOFRAN) injection 4 mg (not administered)  calcium carbonate (OSCAL) tablet 1,500 mg (not administered)  citalopram (CELEXA) tablet 10 mg (not administered)  gabapentin (NEURONTIN) capsule 300 mg (not administered)  letrozole [facility]) tablet 2.5 mg (not administered)  lidocaine-prilocaine (EMLA) cream 1 application (not administered)  pantoprazole (PROTONIX) EC tablet 40 mg (not administered)  zolpidem (AMBIEN) tablet 10 mg (not administered)  fentaNYL (  [place] - dosed mcg/hr) patch 25 mcg (not administered)  oxyCODONE (Oxy IR/ROXICODONE) immediate release tablet 15 mg (not administered)  palbociclib (IBRANCE) capsule 125 mg (not administered)  buPROPion (WELLBUTRIN XL) 24 hr tablet 300 mg (not administered)  LORazepam (ATIVAN) tablet 1-2 mg (not administered)  cholecalciferol (VITAMIN D) tablet 1,000 Units (not administered)  fluocinonide cream (LIDEX) 6.96 % 1 application (not administered)  ibuprofen (ADVIL,MOTRIN) tablet 600 mg (not administered)  fosfomycin (MONUROL) packet 3 g (not administered)  0.9 %  sodium chloride infusion (not administered)  enoxaparin (LOVENOX) injection 40 mg (not administered)  morphine 2 MG/ML injection 4 mg (4 mg Intravenous Given 09/08/16 2201)  ondansetron (ZOFRAN) injection 4 mg (4 mg Intravenous Given 09/08/16 2159)  sodium chloride 0.9 % bolus 1,000 mL (1,000 mLs Intravenous New Bag/Given 09/08/16 2159)     Initial Impression / Assessment and Plan / ED Course  I have reviewed the triage vital signs and the nursing notes.  Pertinent labs & imaging results that were available during my care of the patient were reviewed by me and considered in my medical decision making (see chart for details).  Clinical Course    Patient presents with right upper quadrant abdominal  pain worsening over the last week. Patient has had difficulty controlling her pain with her oral home medications. Suspect metastatic disease spread to the liver. Due to the patient's difficulty in controlling her pain with oral medications, patient to be admitted for IV pain control. Findings and plan of care discussed with Tanna Furry, MD.   11:26 PM spoke with Dr. Blaine Hamper, hospitalist, who agreed to admit the patient to [facility] observation.  Vitals:   09/08/16 1842 09/08/16 1843 09/08/16 2046 09/08/16 2216  BP: (!) 141/102  135/97 140/97  Pulse: 115  94 93  Resp: _0 Temp: 99.3 F (37.4 C)     TempSrc: Oral     SpO2: 100%  97% 93%  Weight:  98.9 kg    Height:  _1  (1.575 m)       Final Clinical Impressions(s) / ED Diagnoses   Final diagnoses:  Abdominal pain    New Prescriptions New Prescriptions   No medications on file     Layla Maw 09/09/16 0029    Tanna Furry, MD 09/18/16 2034

## 2016-09-08 NOTE — ED Triage Notes (Signed)
Patient currently has on 25mg  fentanyl patch which was replaced prior to arrival.  Patient changes this every 3 days.

## 2016-09-08 NOTE — ED Notes (Signed)
Bed: [id number] Expected date:  Expected time:  Means of arrival:  Comments: Tr 2

## 2016-09-08 NOTE — ED Notes (Signed)
MD at bedside. 

## 2016-09-09 ENCOUNTER — Encounter (HOSPITAL_COMMUNITY): Payer: Self-pay | Admitting: Radiology

## 2016-09-09 ENCOUNTER — Observation Stay (HOSPITAL_COMMUNITY): Payer: Medicare Other

## 2016-09-09 DIAGNOSIS — K831 Obstruction of bile duct: Secondary | ICD-10-CM | POA: Diagnosis not present

## 2016-09-09 DIAGNOSIS — K5900 Constipation, unspecified: Secondary | ICD-10-CM | POA: Diagnosis not present

## 2016-09-09 DIAGNOSIS — G4730 Sleep apnea, unspecified: Secondary | ICD-10-CM | POA: Diagnosis not present

## 2016-09-09 DIAGNOSIS — Z881 Allergy status to other antibiotic agents status: Secondary | ICD-10-CM | POA: Diagnosis not present

## 2016-09-09 DIAGNOSIS — E669 Obesity, unspecified: Secondary | ICD-10-CM | POA: Diagnosis present

## 2016-09-09 DIAGNOSIS — R932 Abnormal findings on diagnostic imaging of liver and biliary tract: Secondary | ICD-10-CM | POA: Diagnosis not present

## 2016-09-09 DIAGNOSIS — Z515 Encounter for palliative care: Secondary | ICD-10-CM | POA: Diagnosis not present

## 2016-09-09 DIAGNOSIS — F329 Major depressive disorder, single episode, unspecified: Secondary | ICD-10-CM | POA: Diagnosis not present

## 2016-09-09 DIAGNOSIS — R935 Abnormal findings on diagnostic imaging of other abdominal regions, including retroperitoneum: Secondary | ICD-10-CM | POA: Diagnosis not present

## 2016-09-09 DIAGNOSIS — Z888 Allergy status to other drugs, medicaments and biological substances status: Secondary | ICD-10-CM | POA: Diagnosis not present

## 2016-09-09 DIAGNOSIS — K838 Other specified diseases of biliary tract: Secondary | ICD-10-CM | POA: Diagnosis not present

## 2016-09-09 DIAGNOSIS — Z79811 Long term (current) use of aromatase inhibitors: Secondary | ICD-10-CM | POA: Diagnosis not present

## 2016-09-09 DIAGNOSIS — D259 Leiomyoma of uterus, unspecified: Secondary | ICD-10-CM | POA: Diagnosis not present

## 2016-09-09 DIAGNOSIS — Z66 Do not resuscitate: Secondary | ICD-10-CM | POA: Diagnosis present

## 2016-09-09 DIAGNOSIS — Z4659 Encounter for fitting and adjustment of other gastrointestinal appliance and device: Secondary | ICD-10-CM | POA: Diagnosis not present

## 2016-09-09 DIAGNOSIS — R109 Unspecified abdominal pain: Secondary | ICD-10-CM | POA: Diagnosis not present

## 2016-09-09 DIAGNOSIS — K760 Fatty (change of) liver, not elsewhere classified: Secondary | ICD-10-CM | POA: Diagnosis not present

## 2016-09-09 DIAGNOSIS — R748 Abnormal levels of other serum enzymes: Secondary | ICD-10-CM | POA: Diagnosis not present

## 2016-09-09 DIAGNOSIS — R8589 Other abnormal findings in specimens from digestive organs and abdominal cavity: Secondary | ICD-10-CM | POA: Diagnosis not present

## 2016-09-09 DIAGNOSIS — Z803 Family history of malignant neoplasm of breast: Secondary | ICD-10-CM | POA: Diagnosis not present

## 2016-09-09 DIAGNOSIS — K5903 Drug induced constipation: Secondary | ICD-10-CM | POA: Diagnosis not present

## 2016-09-09 DIAGNOSIS — Z90721 Acquired absence of ovaries, unilateral: Secondary | ICD-10-CM | POA: Diagnosis not present

## 2016-09-09 DIAGNOSIS — F411 Generalized anxiety disorder: Secondary | ICD-10-CM | POA: Diagnosis not present

## 2016-09-09 DIAGNOSIS — R17 Unspecified jaundice: Secondary | ICD-10-CM | POA: Diagnosis not present

## 2016-09-09 DIAGNOSIS — C50512 Malignant neoplasm of lower-outer quadrant of left female breast: Secondary | ICD-10-CM | POA: Diagnosis not present

## 2016-09-09 DIAGNOSIS — R918 Other nonspecific abnormal finding of lung field: Secondary | ICD-10-CM | POA: Diagnosis not present

## 2016-09-09 DIAGNOSIS — Z9221 Personal history of antineoplastic chemotherapy: Secondary | ICD-10-CM | POA: Diagnosis not present

## 2016-09-09 DIAGNOSIS — K219 Gastro-esophageal reflux disease without esophagitis: Secondary | ICD-10-CM | POA: Diagnosis present

## 2016-09-09 DIAGNOSIS — Z170 Estrogen receptor positive status [ER+]: Secondary | ICD-10-CM | POA: Diagnosis not present

## 2016-09-09 DIAGNOSIS — G4733 Obstructive sleep apnea (adult) (pediatric): Secondary | ICD-10-CM | POA: Diagnosis present

## 2016-09-09 DIAGNOSIS — C7800 Secondary malignant neoplasm of unspecified lung: Secondary | ICD-10-CM | POA: Diagnosis present

## 2016-09-09 DIAGNOSIS — Z79899 Other long term (current) drug therapy: Secondary | ICD-10-CM | POA: Diagnosis not present

## 2016-09-09 DIAGNOSIS — G8929 Other chronic pain: Secondary | ICD-10-CM | POA: Diagnosis present

## 2016-09-09 DIAGNOSIS — K859 Acute pancreatitis without necrosis or infection, unspecified: Secondary | ICD-10-CM | POA: Diagnosis not present

## 2016-09-09 DIAGNOSIS — K8051 Calculus of bile duct without cholangitis or cholecystitis with obstruction: Secondary | ICD-10-CM | POA: Diagnosis not present

## 2016-09-09 DIAGNOSIS — R197 Diarrhea, unspecified: Secondary | ICD-10-CM | POA: Diagnosis not present

## 2016-09-09 DIAGNOSIS — R740 Nonspecific elevation of levels of transaminase and lactic acid dehydrogenase [LDH]: Secondary | ICD-10-CM | POA: Diagnosis not present

## 2016-09-09 DIAGNOSIS — R1011 Right upper quadrant pain: Secondary | ICD-10-CM | POA: Diagnosis not present

## 2016-09-09 DIAGNOSIS — R945 Abnormal results of liver function studies: Secondary | ICD-10-CM | POA: Diagnosis not present

## 2016-09-09 DIAGNOSIS — G893 Neoplasm related pain (acute) (chronic): Secondary | ICD-10-CM | POA: Diagnosis not present

## 2016-09-09 DIAGNOSIS — T85520A Displacement of bile duct prosthesis, initial encounter: Secondary | ICD-10-CM | POA: Diagnosis not present

## 2016-09-09 DIAGNOSIS — R1084 Generalized abdominal pain: Secondary | ICD-10-CM | POA: Diagnosis not present

## 2016-09-09 DIAGNOSIS — C7951 Secondary malignant neoplasm of bone: Secondary | ICD-10-CM | POA: Diagnosis not present

## 2016-09-09 DIAGNOSIS — R1013 Epigastric pain: Secondary | ICD-10-CM | POA: Diagnosis not present

## 2016-09-09 DIAGNOSIS — D649 Anemia, unspecified: Secondary | ICD-10-CM | POA: Diagnosis not present

## 2016-09-09 DIAGNOSIS — K7689 Other specified diseases of liver: Secondary | ICD-10-CM | POA: Diagnosis not present

## 2016-09-09 DIAGNOSIS — K5909 Other constipation: Secondary | ICD-10-CM | POA: Diagnosis not present

## 2016-09-09 DIAGNOSIS — Z800 Family history of malignant neoplasm of digestive organs: Secondary | ICD-10-CM | POA: Diagnosis not present

## 2016-09-09 DIAGNOSIS — Z9013 Acquired absence of bilateral breasts and nipples: Secondary | ICD-10-CM | POA: Diagnosis not present

## 2016-09-09 LAB — COMPREHENSIVE METABOLIC PANEL
ALT: 271 U/L — AB (ref 14–54)
ANION GAP: 5 (ref 5–15)
AST: 104 U/L — ABNORMAL HIGH (ref 15–41)
Albumin: 3.5 g/dL (ref 3.5–5.0)
Alkaline Phosphatase: 93 U/L (ref 38–126)
BUN: <5 mg/dL — ABNORMAL LOW (ref 6–20)
CHLORIDE: 110 mmol/L (ref 101–111)
CO2: 23 mmol/L (ref 22–32)
CREATININE: 0.57 mg/dL (ref 0.44–1.00)
Calcium: 7.9 mg/dL — ABNORMAL LOW (ref 8.9–10.3)
Glucose, Bld: 98 mg/dL (ref 65–99)
Potassium: 2.9 mmol/L — ABNORMAL LOW (ref 3.5–5.1)
Sodium: 138 mmol/L (ref 135–145)
Total Bilirubin: 5.1 mg/dL — ABNORMAL HIGH (ref 0.3–1.2)
Total Protein: 6.4 g/dL — ABNORMAL LOW (ref 6.5–8.1)

## 2016-09-09 LAB — LACTIC ACID, PLASMA: LACTIC ACID, VENOUS: 0.7 mmol/L (ref 0.5–1.9)

## 2016-09-09 LAB — CBC
HCT: 32.9 % — ABNORMAL LOW (ref 36.0–46.0)
Hemoglobin: 11.6 g/dL — ABNORMAL LOW (ref 12.0–15.0)
MCH: 33 pg (ref 26.0–34.0)
MCHC: 35.3 g/dL (ref 30.0–36.0)
MCV: 93.5 fL (ref 78.0–100.0)
PLATELETS: 134 10*3/uL — AB (ref 150–400)
RBC: 3.52 MIL/uL — AB (ref 3.87–5.11)
RDW: 14.5 % (ref 11.5–15.5)
WBC: 2.7 10*3/uL — AB (ref 4.0–10.5)

## 2016-09-09 LAB — BILIRUBIN, DIRECT: BILIRUBIN DIRECT: 3 mg/dL — AB (ref 0.1–0.5)

## 2016-09-09 LAB — GLUCOSE, CAPILLARY: GLUCOSE-CAPILLARY: 93 mg/dL (ref 65–99)

## 2016-09-09 MED ORDER — ONDANSETRON HCL 4 MG/2ML IJ SOLN
4.0000 mg | Freq: Four times a day (QID) | INTRAMUSCULAR | Status: DC | PRN
  Administered 2016-09-09 – 2016-09-13 (×3): 4 mg via INTRAVENOUS
  Filled 2016-09-09 (×3): qty 2

## 2016-09-09 MED ORDER — POTASSIUM CHLORIDE CRYS ER 20 MEQ PO TBCR
40.0000 meq | EXTENDED_RELEASE_TABLET | Freq: Two times a day (BID) | ORAL | Status: AC
  Administered 2016-09-09 (×2): 40 meq via ORAL
  Filled 2016-09-09 (×2): qty 2

## 2016-09-09 MED ORDER — IOPAMIDOL (ISOVUE-300) INJECTION 61%
100.0000 mL | Freq: Once | INTRAVENOUS | Status: AC | PRN
  Administered 2016-09-09: 100 mL via INTRAVENOUS

## 2016-09-09 MED ORDER — CITALOPRAM HYDROBROMIDE 20 MG PO TABS
10.0000 mg | ORAL_TABLET | Freq: Every day | ORAL | Status: DC
  Administered 2016-09-09 – 2016-09-22 (×13): 10 mg via ORAL
  Filled 2016-09-09 (×13): qty 1

## 2016-09-09 MED ORDER — LORAZEPAM 1 MG PO TABS
1.0000 mg | ORAL_TABLET | Freq: Every day | ORAL | Status: DC
  Administered 2016-09-09 – 2016-09-22 (×13): 1 mg via ORAL
  Filled 2016-09-09 (×13): qty 1

## 2016-09-09 NOTE — Progress Notes (Signed)
MEDICATION RELATED CONSULT NOTE - INITIAL   Pharmacy Consult for palbociclib [facility]) Indication: oral Chemo  Allergies  Allergen Reactions  . Doxycycline Hydrochloride Nausea And Vomiting    Patient Measurements: Height: 5\' 2"  (157.5 cm) Weight: 213 lb 3 oz (96.7 kg) IBW/kg (Calculated) : 50.1 Adjusted Body Weight:   Vital Signs: Temp: 98.8 F (37.1 C) (10/28 0127) Temp Source: Oral (10/28 0127) BP: 151/100 (10/28 0127) Pulse Rate: 97 (10/28 0127) Intake/Output from previous day: No intake/output data recorded. Intake/Output from this shift: No intake/output data recorded.  Labs:  Recent Labs  09/07/16 0951 09/07/16 0951 09/08/16 2147 09/09/16 0130  WBC 2.2*  --  2.9* 2.7*  HGB 12.3  --  12.4 11.6*  HCT 34.9  --  35.0* 32.9*  PLT 139*  --  153 134*  APTT  --   --  57*  --   CREATININE  --  0.7 0.54  --   ALBUMIN  --  3.5 4.0  --   PROT  --  7.0 6.9  --   AST  --  205* 110*  --   ALT  --  507* 311*  --   ALKPHOS  --  128 101  --   BILITOT  --  3.99* 5.1*  --    Estimated Creatinine Clearance: 87.2 mL/min (by C-G formula based on SCr of 0.54 mg/dL).   Microbiology: No results found for this or any previous visit (from the past 720 hour(s)).  Medical History: Past Medical History:  Diagnosis Date  . Anxiety   . Breast cancer, stage 3 ([place]) 2012/ 05/2015   left; inflammatory  . Depression   . Eczema   . Frequent urination   . GERD (gastroesophageal reflux disease)   . Headache   . Lymphedema   . Neuromuscular disorder ([place])   . 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 Obesity   . Sleep apnea   . Thyroid disease    Possible would like to be evaluated    Medications:  Prescriptions Prior to Admission  Medication Sig Dispense Refill Last Dose  . buPROPion (WELLBUTRIN XL) 300 MG 24 hr tablet Take 300 mg by mouth daily.   09/08/2016 at Unknown time  . calcium carbonate (OSCAL) 1500 (600 Ca) MG TABS tablet Take 600 mg of elemental  calcium by mouth daily.   09/08/2016 at Unknown time  . cholecalciferol (VITAMIN D) 1000 UNITS tablet Take 1,000 Units by mouth daily.    09/08/2016 at Unknown time  . citalopram (CELEXA) 40 MG tablet Take 10 mg by mouth daily.   09/07/2016 at Unknown time  . fentaNYL (DURAGESIC - DOSED MCG/HR) 25 MCG/HR patch Place 1 patch (25 mcg total) onto the skin every 3 (three) days. 10 patch 0 09/08/2016 at 1800  . fluocinonide cream (LIDEX) [id number] % Apply 1 application topically 2 (two) times daily as needed (for rash.).   Past Month at Unknown time  . gabapentin (NEURONTIN) 300 MG capsule Take 300 mg by mouth 3 (three) times daily.   09/08/2016 at Unknown time  . ibuprofen (ADVIL,MOTRIN) 200 MG tablet Take 600 mg by mouth every 8 (eight) hours as needed for mild pain or moderate pain.    Past Month at Unknown time  . letrozole (FEMARA) 2.5 MG tablet Take 2.5 mg by mouth daily.   09/08/2016 at Unknown time  . lidocaine-prilocaine (EMLA) cream Apply 1 application topically as needed (prior to accessing port).   09/08/2016  at Unknown time  . LORazepam (ATIVAN) 1 MG tablet Take 1-2 mg by mouth 2 (two) times daily. Pt takes one tablet in the morning and two at bedtime.   09/08/2016 at Unknown time  . naproxen sodium (ANAPROX) 220 MG tablet Take 220-440 mg by mouth 2 (two) times daily as needed (for pain).    Past Month at Unknown time  . omeprazole (PRILOSEC) 20 MG capsule Take 20 mg by mouth daily.   09/08/2016 at Unknown time  . ondansetron (ZOFRAN) 8 MG tablet Take 8 mg by mouth every 8 (eight) hours as needed for nausea or vomiting.   09/08/2016 at Unknown time  . oxyCODONE (ROXICODONE) 15 MG immediate release tablet Take 1 tablet (15 mg total) by mouth every 4 (four) hours as needed. for pain 90 tablet 0 Past Week at Unknown time  . palbociclib (IBRANCE) 125 MG capsule Take 1 capsule (125 mg total) by mouth daily with breakfast. Take whole with food for 21 days, then take 7 days off 21 capsule 6 09/08/2016 at  Unknown time  . zolpidem (AMBIEN) 10 MG tablet Take 10 mg by mouth at bedtime.   09/07/2016 at Unknown time   Scheduled:  . buPROPion  300 mg Oral Daily  . calcium carbonate  500 mg of elemental calcium Oral Daily  . cholecalciferol  1,000 Units Oral Daily  . citalopram  10 mg Oral Daily  . enoxaparin (LOVENOX) injection  40 mg Subcutaneous Q24H  . [START ON 09/11/2016] fentaNYL  25 mcg Transdermal Q72H  . gabapentin  300 mg Oral TID  . letrozole  2.5 mg Oral Daily  . LORazepam  1 mg Oral Daily  . LORazepam  2 mg Oral QHS  . pantoprazole  40 mg Oral Daily  . zolpidem  5 mg Oral QHS    Assessment: Patient on oral chemo palociclib [facility]) prior to admission.  During admit, H&P notes possible UTI and antibiotics to treat ordered.  Pharmacy will consider this an active infection.  Goal of Therapy:  Safe and effective use of palociclib [facility])  Plan:  D/C palociclib [facility]) Please obtain oncology consult for further plan for oral chemo. Pharmacy will continue to monitor  Tyler Deis, [place] 09/09/2016,6:36 AM

## 2016-09-09 NOTE — ED Notes (Signed)
Staff attempted to collect labs from pt, pt states that she only wants labs drawn off her port.

## 2016-09-09 NOTE — Progress Notes (Signed)
PROGRESS NOTE    Tina Vaughan  [id number] DOB: 1961-07-11 DOA: 09/08/2016 PCP: Mackie Pai, PA-C    Brief Narrative:  55 y.o. female with medical history significant of metastatic breast cancer (s/p of surgery and currently on Ibrance and letrozole), GERD, depression, anxiety, OSA on CPAP, edema, who presents with abdominal pain.  Pt states that she has been having abdominal pain in the past 5 days, which is new. Her abdominal pain is located in the right upper quadrant, constant, 5 out of 10 in severity, radiating to the right back. She also has nausea, but no vomiting. She has been having diarrhea in the past 4 days. She states that she was on stool softener, but she continues to have diarrhea after she stopped stool softer 3 days ago. She has 5-7 bowel movements with loose stool each day. No fever or chills. Patient denies symptoms of UTI. She does not have chest pain, shortness of breath, cough, unilateral weakness  Assessment & Plan:   Principal Problem:   Abdominal pain Active Problems:   Breast cancer of lower-outer quadrant of left female breast (HCC)   Generalized anxiety disorder   Bone metastasis (HCC)   Depression   Abnormal liver function   Diarrhea  Abdominal pain and Abnormal liver function: -Presenting ALP 101, AST 110, ALT 311 and total bilirubin 5.1. -CT abd with fatty liver, no focal mass seen. [name] and CT reviewed. -Abd [name] with fatty liver, no cholelithiasis or evidence of acute cholecystitis It is likely that the patient has metastasized disease to liver, causing abnormal liver function and abdominal pain. Lipase normal.  -Continue  Roxicodone, fentanyl patch and PRN Dilaudid - Recent labs reviewed. LFT's trending down since 10/24 - Question medication/chemo related transaminitis?  Breast cancer of lower-outer quadrant of left female breast [facility]):  - patient is s/p of bilateral mastectomy -Patient is followed by Dr. Lindi Adie, last seen on  10/12/18/15 -Patient continues on Ibrance and letrozole  Depression and anxiety:  -Remains stable without suicidal or homicidal ideations. -For now, will continue Wellbutrin, Celexa and Ativan as tolerated  Diarrhea:  -Cdiff ordered at time of admission, pending -Cont hydration as tolerated  Positive urinalysis:  -Presenting urinalysis was suggestive of possible UTI. -Patient was given fosfomycin  -follow cultures  DVT prophylaxis: Lovenox Code Status: DNR Family Communication: Pt in room, family not at bedside Disposition Plan: Uncertain at this time  Consultants:     Procedures:     Antimicrobials: Anti-infectives    None       Subjective: No complaints at present  Objective: Vitals:   09/08/16 1843 09/08/16 2046 09/08/16 2216 09/09/16 0127  BP:  135/97 140/97 (!) 151/100  Pulse:  94 93 97  Resp:  20 18 18   Temp:    98.8 F (37.1 C)  TempSrc:    Oral  SpO2:  97% 93% 99%  Weight: 98.9 kg (218 lb)   96.7 kg (213 lb 3 oz)  Height: 5\' 2"  (1.575 m)   5\' 2"  (1.575 m)   No intake or output data in the 24 hours ending 09/09/16 1526 Filed Weights   09/08/16 1843 09/09/16 0127  Weight: 98.9 kg (218 lb) 96.7 kg (213 lb 3 oz)    Examination:  General exam: Appears calm and comfortable  Respiratory system: Clear to auscultation. Respiratory effort normal. Cardiovascular system: S1 & S2 heard, RRR. No JVD, murmurs, rubs, gallops or clicks. No pedal edema. Gastrointestinal system: Abdomen is nondistended, soft and nontender. No organomegaly or  masses felt. Normal bowel sounds heard. Central nervous system: Alert and oriented. No focal neurological deficits. Extremities: Symmetric 5 x 5 power. Skin: No rashes, lesions or ulcers Psychiatry: Judgement and insight appear normal. Mood & affect appropriate.   Data Reviewed: I have personally reviewed following labs and imaging studies  CBC:  Recent Labs Lab 09/05/16 1036 09/07/16 0951 09/08/16 2147  09/09/16 0130  WBC 2.2* 2.2* 2.9* 2.7*  NEUTROABS 1.3* 1.4* 1.8  --   HGB 12.5 12.3 12.4 11.6*  HCT 36.2 34.9 35.0* 32.9*  MCV 94.3 92.6 91.1 93.5  PLT 207 139* 153 [id number]*   Basic Metabolic Panel:  Recent Labs Lab 09/05/16 1036 09/07/16 0951 09/08/16 2147 09/09/16 0130  NA 143 143 141 138  K 3.5 3.5 3.3* 2.9*  CL  --   --  110 110  CO2 27 24 24 23   GLUCOSE 95 119 99 98  BUN 12.4 6.6* <5* <5*  CREATININE 0.8 0.7 0.54 0.57  CALCIUM 8.9 8.6 8.5* 7.9*   GFR: Estimated Creatinine Clearance: 87.2 mL/min (by C-G formula based on SCr of 0.57 mg/dL). Liver Function Tests:  Recent Labs Lab 09/05/16 1036 09/07/16 0951 09/08/16 2147 09/09/16 0130  AST 425* 205* 110* 104*  ALT 513* 507* 311* 271*  ALKPHOS 114 128 101 93  BILITOT 1.91* 3.99* 5.1* 5.1*  PROT 6.8 7.0 6.9 6.4*  ALBUMIN 3.6 3.5 4.0 3.5    Recent Labs Lab 09/08/16 2147  LIPASE 26    Recent Labs Lab 09/08/16 2147  AMMONIA 46*   Coagulation Profile:  Recent Labs Lab 09/08/16 2147  INR 0.97   Cardiac Enzymes: No results for input(s): CKTOTAL, CKMB, CKMBINDEX, TROPONINI in the last 168 hours. BNP (last 3 results) No results for input(s): PROBNP in the last 8760 hours. HbA1C: No results for input(s): HGBA1C in the last 72 hours. CBG:  Recent Labs Lab 09/09/16 0800  GLUCAP 93   Lipid Profile: No results for input(s): CHOL, HDL, LDLCALC, TRIG, CHOLHDL, LDLDIRECT in the last 72 hours. Thyroid Function Tests: No results for input(s): TSH, T4TOTAL, FREET4, T3FREE, THYROIDAB in the last 72 hours. Anemia Panel: No results for input(s): VITAMINB12, FOLATE, FERRITIN, TIBC, IRON, RETICCTPCT in the last 72 hours. Sepsis Labs:  Recent Labs Lab 09/09/16 0300  LATICACIDVEN 0.7    No results found for this or any previous visit (from the past 240 hour(s)).   Radiology Studies: US Abdomen Complete  Result Date: 09/09/2016 CLINICAL DATA:  Abdominal pain EXAM: ABDOMEN ULTRASOUND COMPLETE COMPARISON:   CT abdomen 09/09/2016, 05/08/2016 FINDINGS: Gallbladder: No gallstones or wall thickening visualized. No sonographic Murphy sign noted by sonographer. Common bile duct: Diameter: 4 mm Liver: Small area of relative hypoechogenicity adjacent to the gallbladder fossa likely reflecting an area of focal fatty sparing. Increased hepatic parenchymal echogenicity. IVC: Limited visualization secondary to overlying bowel gas. Pancreas: Limited visualization secondary to overlying bowel gas. Spleen: Size and appearance within normal limits. Right Kidney: Length: 11.3 cm. Echogenicity within normal limits. No mass or hydronephrosis visualized. Left Kidney: Length: 11.1 cm. Echogenicity within normal limits. No mass or hydronephrosis visualized. Abdominal aorta: 2.8 cm in greatest AP diameter. Other findings: None. IMPRESSION: 1. No cholelithiasis or sonographic evidence of acute cholecystitis. 2. Hepatic steatosis. 3. Ectatic abdominal aorta at risk for aneurysm development. Recommend followup by ultrasound in 5 years. This recommendation follows ACR consensus guidelines: White Paper of the ACR Incidental Findings Committee II on Vascular Findings. J Am Coll Radiol 2013; 10:789-794. Electronically Signed   By:  Kathreen Devoid   On: 09/09/2016 09:41   Ct Abdomen Pelvis W Contrast  Result Date: 09/09/2016 CLINICAL DATA:  History of breast cancer progressed to the lymph nodes. Right-sided abdominal pain with nausea EXAM: CT ABDOMEN AND PELVIS WITH CONTRAST TECHNIQUE: Multidetector CT imaging of the abdomen and pelvis was performed using the standard protocol following bolus administration of intravenous contrast. CONTRAST:  166mL ISOVUE-300 IOPAMIDOL (ISOVUE-300) INJECTION 61% COMPARISON:  05/08/2016, 11/01/2015 FINDINGS: Lower chest: Patchy dependent atelectasis. Hazy atelectasis versus infiltrate in the left lower lobe. No effusion. No discrete nodules. Hepatobiliary: Diffuse decreased density of the hepatic parenchyma  consistent with fatty infiltration. A focal hypodensity near the falciform ligament is unchanged and could represent more focal geographic fatty change. No discrete enhancing hepatic mass visualized. No calcified stones in the gallbladder. No biliary dilatation. Pancreas: Unremarkable. No pancreatic ductal dilatation or surrounding inflammatory changes. Spleen: Normal in size without focal abnormality. Adrenals/Urinary Tract: Adrenal glands are unremarkable. Kidneys are normal, without renal calculi, focal lesion, or hydronephrosis. Bladder is unremarkable. Stomach/Bowel: Stomach is nondilated. There is no dilated small bowel. Appendix is visualized and is normal. Vascular/Lymphatic: Non aneurysmal aorta. Slight interval increase in size of periportal lymph nodes. The larger of the 2 nodes measures 1.2 cm, compared with 8 mm previously. A node anterior to the pancreatic head neck measures 1 cm compared with 7 cm previous. No significantly enlarged retroperitoneal or pelvic nodes. Reproductive: Multiple calcified fibroids within the uterus. No gross adnexal masses. Other: No free air or free fluid. Musculoskeletal: Extensive foci of sclerosis involving the femurs, pelvic bones, and spine, compatible with skeletal metastatic disease. IMPRESSION: 1. Fatty infiltration of the liver. No definitive focal hepatic mass visualized. 2. Slight interval increase in periportal adenopathy, metastatic disease cannot be excluded. 3. Calcified uterine fibroids 4. 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 Electronically Signed   By: Donavan Foil M.D.   On: 09/09/2016 01:51    Scheduled Meds: . buPROPion  300 mg Oral Daily  . calcium carbonate  500 mg of elemental calcium Oral Daily  . cholecalciferol  1,000 Units Oral Daily  . citalopram  10 mg Oral Daily  . enoxaparin (LOVENOX) injection  40 mg Subcutaneous Q24H  . [START ON 09/11/2016] fentaNYL  25 mcg Transdermal Q72H  . gabapentin  300 mg  Oral TID  . letrozole  2.5 mg Oral Daily  . LORazepam  1 mg Oral Daily  . LORazepam  2 mg Oral QHS  . pantoprazole  40 mg Oral Daily  . potassium chloride  40 mEq Oral BID  . zolpidem  5 mg Oral QHS   Continuous Infusions: . sodium chloride 100 mL/hr at 09/09/16 1230     LOS: 0 days   Jashaun Penrose, Orpah Melter, MD [facility] Pager [phone]  If 7PM-7AM, please contact night-coverage [email or web address] Password [email or web address] 09/09/2016, 3:26 PM

## 2016-09-10 LAB — COMPREHENSIVE METABOLIC PANEL
ALBUMIN: 3.6 g/dL (ref 3.5–5.0)
ALT: 238 U/L — ABNORMAL HIGH (ref 14–54)
ANION GAP: 5 (ref 5–15)
AST: 108 U/L — ABNORMAL HIGH (ref 15–41)
Alkaline Phosphatase: 92 U/L (ref 38–126)
BUN: <5 mg/dL — ABNORMAL LOW (ref 6–20)
CO2: 24 mmol/L (ref 22–32)
Calcium: 8.4 mg/dL — ABNORMAL LOW (ref 8.9–10.3)
Chloride: 110 mmol/L (ref 101–111)
Creatinine, Ser: 0.47 mg/dL (ref 0.44–1.00)
GFR calc non Af Amer: >60 mL/min (ref >60)
GLUCOSE: 115 mg/dL — AB (ref 65–99)
POTASSIUM: 3.7 mmol/L (ref 3.5–5.1)
SODIUM: 139 mmol/L (ref 135–145)
TOTAL PROTEIN: 6.5 g/dL (ref 6.5–8.1)
Total Bilirubin: 5.2 mg/dL — ABNORMAL HIGH (ref 0.3–1.2)

## 2016-09-10 LAB — CBC
HCT: 33.8 % — ABNORMAL LOW (ref 36.0–46.0)
HCT: 32.8 % — ABNORMAL LOW (ref 36.0–46.0)
Hemoglobin: 11.6 g/dL — ABNORMAL LOW (ref 12.0–15.0)
Hemoglobin: 11.4 g/dL — ABNORMAL LOW (ref 12.0–15.0)
MCH: 32.3 pg (ref 26.0–34.0)
MCH: 32 pg (ref 26.0–34.0)
MCHC: 34.3 g/dL (ref 30.0–36.0)
MCHC: 34.8 g/dL (ref 30.0–36.0)
MCV: 94.2 fL (ref 78.0–100.0)
MCV: 92.1 fL (ref 78.0–100.0)
PLATELETS: 146 10*3/uL — AB (ref 150–400)
Platelets: 140 10*3/uL — ABNORMAL LOW (ref 150–400)
RBC: 3.59 MIL/uL — ABNORMAL LOW (ref 3.87–5.11)
RBC: 3.56 MIL/uL — ABNORMAL LOW (ref 3.87–5.11)
RDW: 14.8 % (ref 11.5–15.5)
RDW: 14.7 % (ref 11.5–15.5)
WBC: 2.1 10*3/uL — AB (ref 4.0–10.5)
WBC: 2 10*3/uL — ABNORMAL LOW (ref 4.0–10.5)

## 2016-09-10 LAB — HEPATITIS PANEL, ACUTE
HCV Ab: <0.1 s/co ratio (ref 0.0–0.9)
HEP B C IGM: NEGATIVE
Hep A IgM: NEGATIVE
Hepatitis B Surface Ag: NEGATIVE

## 2016-09-10 LAB — GLUCOSE, CAPILLARY: Glucose-Capillary: 104 mg/dL — ABNORMAL HIGH (ref 65–99)

## 2016-09-10 LAB — URINE CULTURE

## 2016-09-10 MED ORDER — MORPHINE SULFATE (PF) 2 MG/ML IV SOLN
2.0000 mg | Freq: Once | INTRAVENOUS | Status: AC
  Administered 2016-09-10: 2 mg via INTRAVENOUS
  Filled 2016-09-10: qty 1

## 2016-09-10 MED ORDER — POLYETHYLENE GLYCOL 3350 17 G PO PACK: 17.0000 g | PACK | Freq: Every day | ORAL | Status: DC | PRN

## 2016-09-10 MED ORDER — DOCUSATE SODIUM 100 MG PO CAPS
100.0000 mg | ORAL_CAPSULE | Freq: Two times a day (BID) | ORAL | Status: DC
  Administered 2016-09-10 – 2016-09-14 (×8): 100 mg via ORAL
  Filled 2016-09-10 (×8): qty 1

## 2016-09-10 NOTE — Progress Notes (Signed)
PROGRESS NOTE    Tina Vaughan  [id number] DOB: [birth year] DOA: 09/08/2016 PCP: Mackie Pai, PA-C    Brief Narrative:  55 y.o. female with medical history significant of metastatic breast cancer (s/p of surgery and currently on Ibrance and letrozole), GERD, depression, anxiety, OSA on CPAP, edema, who presents with abdominal pain.  Pt states that she has been having abdominal pain in the past 5 days, which is new. Her abdominal pain is located in the right upper quadrant, constant, 5 out of 10 in severity, radiating to the right back. She also has nausea, but no vomiting. She has been having diarrhea in the past 4 days. She states that she was on stool softener, but she continues to have diarrhea after she stopped stool softer 3 days ago. She has 5-7 bowel movements with loose stool each day. No fever or chills. Patient denies symptoms of UTI. She does not have chest pain, shortness of breath, cough, unilateral weakness  Assessment & Plan:   Principal Problem:   Abdominal pain Active Problems:   Breast cancer of lower-outer quadrant of left female breast (HCC)   Generalized anxiety disorder   Bone metastasis (HCC)   Depression   Abnormal liver function   Diarrhea  Abdominal pain and Abnormal liver function: -Presenting ALP 101, AST 110, ALT 311 and total bilirubin 5.1. -CT abd with fatty liver, no focal mass seen. [name] and CT reviewed. -Abd [name] with fatty liver, no cholelithiasis or evidence of acute cholecystitis It is likely that the patient has metastasized disease to liver, causing abnormal liver function and abdominal pain. Lipase normal.  -Discussed with GI on call. Recs to repeat LFT in AM and if no significant improvement, then consider formal consultation at that time -Otherwise, GI recommends checking: Iron, ferritin, ANA, snti-smooth muscle, anti-mitochondrial ab, cmv, epstein barr, HIV, repeat LFT in AM  Breast cancer of lower-outer quadrant of left female breast  [facility]):  -s/p lateral mastectomy -Patient is followed by Dr. Lindi Adie -For now, patient continues on Ibrance and letrozole  Depression and anxiety:  -Remains stable at this time. -Will continue Wellbutrin, Celexa and Ativan   Diarrhea:  -No further stools since admission -On further questioning, patient states that her diarrhea started after taking laxatives. -Doubt C. difficile colitis.  Positive urinalysis:  -Presenting urinalysis was suggestive of possible UTI. -Patient was given fosfomycin  -Urine culture with multiple species present  DVT prophylaxis: Lovenox Code Status: DNR Family Communication: Pt in room, family not at bedside Disposition Plan: Uncertain at this time  Consultants:   Discussed case with on-call gastroenterologist  Procedures:     Antimicrobials: Anti-infectives    None      Subjective: Still complaining of right upper quadrant pain. Eager to start advancing diet  Objective: Vitals:   09/09/16 2117 09/10/16 0525 09/10/16 1321 09/10/16 1345  BP: (!) 151/92 137/81 (!) 138/107 (!) 146/88  Pulse: 94 88 90 93  Resp: 20 16 18    Temp: 98.6 F (37 C) 98.5 F (36.9 C) 98 F (36.7 C)   TempSrc: Oral Oral Oral   SpO2: 97% 97% 92%   Weight:      Height:        Intake/Output Summary (Last 24 hours) at 09/10/16 1637 Last data filed at 09/10/16 1300  Gross per 24 hour  Intake           3173.3 ml  Output              200 ml  Net           2973.3 ml   Filed Weights   09/08/16 1843 09/09/16 0127  Weight: 98.9 kg (218 lb) 96.7 kg (213 lb 3 oz)    Examination:  General exam: Lying in bed, no acute distress Respiratory system: Normal respiratory effort, no audible wheezing Cardiovascular system: Regular rate, S1-S2 Gastrointestinal system: Soft, positive bowel sounds Central nervous system: CN II through XII grossly intact, sensation intact Extremities: Perfused, no clubbing Skin: Normal skin turgor, no rashes Psychiatry: Mood normal, no  visual hallucinations  Data Reviewed: I have personally reviewed following labs and imaging studies  CBC:  Recent Labs Lab 09/05/16 1036 09/07/16 0951 09/08/16 2147 09/09/16 0130 09/10/16 0415 09/10/16 0840  WBC 2.2* 2.2* 2.9* 2.7* 2.1* 2.0*  NEUTROABS 1.3* 1.4* 1.8  --   --   --   HGB 12.5 12.3 12.4 11.6* 11.6* 11.4*  HCT 36.2 34.9 35.0* 32.9* 33.8* 32.8*  MCV 94.3 92.6 91.1 93.5 94.2 92.1  PLT 207 139* 153 134* 146* [id number]*   Basic Metabolic Panel:  Recent Labs Lab 09/05/16 1036 09/07/16 0951 09/08/16 2147 09/09/16 0130 09/10/16 0840  NA 143 143 141 138 139  K 3.5 3.5 3.3* 2.9* 3.7  CL  --   --  110 110 110  CO2 27 24 24 23 24   GLUCOSE 95 119 99 98 115*  BUN 12.4 6.6* <5* <5* <5*  CREATININE 0.8 0.7 0.54 0.57 0.47  CALCIUM 8.9 8.6 8.5* 7.9* 8.4*   GFR: Estimated Creatinine Clearance: 87.2 mL/min (by C-G formula based on SCr of 0.47 mg/dL). Liver Function Tests:  Recent Labs Lab 09/05/16 1036 09/07/16 0951 09/08/16 2147 09/09/16 0130 09/10/16 0840  AST 425* 205* 110* 104* 108*  ALT 513* 507* 311* 271* 238*  ALKPHOS 114 128 101 93 92  BILITOT 1.91* 3.99* 5.1* 5.1* 5.2*  PROT 6.8 7.0 6.9 6.4* 6.5  ALBUMIN 3.6 3.5 4.0 3.5 3.6    Recent Labs Lab 09/08/16 2147  LIPASE 26    Recent Labs Lab 09/08/16 2147  AMMONIA 46*   Coagulation Profile:  Recent Labs Lab 09/08/16 2147  INR 0.97   Cardiac Enzymes: No results for input(s): CKTOTAL, CKMB, CKMBINDEX, TROPONINI in the last 168 hours. BNP (last 3 results) No results for input(s): PROBNP in the last 8760 hours. HbA1C: No results for input(s): HGBA1C in the last 72 hours. CBG:  Recent Labs Lab 09/09/16 0800 09/10/16 0804  GLUCAP 93 104*   Lipid Profile: No results for input(s): CHOL, HDL, LDLCALC, TRIG, CHOLHDL, LDLDIRECT in the last 72 hours. Thyroid Function Tests: No results for input(s): TSH, T4TOTAL, FREET4, T3FREE, THYROIDAB in the last 72 hours. Anemia Panel: No results for  input(s): VITAMINB12, FOLATE, FERRITIN, TIBC, IRON, RETICCTPCT in the last 72 hours. Sepsis Labs:  Recent Labs Lab 09/09/16 0300  LATICACIDVEN 0.7    Recent Results (from the past 240 hour(s))  Urine culture     Status: Abnormal   Collection Time: 09/08/16 11:51 PM  Result Value Ref Range Status   Specimen Description URINE, CLEAN CATCH  Final   Special Requests NONE  Final   Culture MULTIPLE SPECIES PRESENT, SUGGEST RECOLLECTION (A)  Final   Report Status 09/10/2016 FINAL  Final     Radiology Studies: US Abdomen Complete  Result Date: 09/09/2016 CLINICAL DATA:  Abdominal pain EXAM: ABDOMEN ULTRASOUND COMPLETE COMPARISON:  CT abdomen 09/09/2016, 05/08/2016 FINDINGS: Gallbladder: No gallstones or wall thickening visualized. No sonographic Murphy sign noted by sonographer. Common  bile duct: Diameter: 4 mm Liver: Small area of relative hypoechogenicity adjacent to the gallbladder fossa likely reflecting an area of focal fatty sparing. Increased hepatic parenchymal echogenicity. IVC: Limited visualization secondary to overlying bowel gas. Pancreas: Limited visualization secondary to overlying bowel gas. Spleen: Size and appearance within normal limits. Right Kidney: Length: 11.3 cm. Echogenicity within normal limits. No mass or hydronephrosis visualized. Left Kidney: Length: 11.1 cm. Echogenicity within normal limits. No mass or hydronephrosis visualized. Abdominal aorta: 2.8 cm in greatest AP diameter. Other findings: None. IMPRESSION: 1. No cholelithiasis or sonographic evidence of acute cholecystitis. 2. Hepatic steatosis. 3. Ectatic abdominal aorta at risk for aneurysm development. Recommend followup by ultrasound in 5 years. This recommendation follows ACR consensus guidelines: White Paper of the ACR Incidental Findings Committee II on Vascular Findings. J Am Coll Radiol 2013; 10:789-794. Electronically Signed   By: Kathreen Devoid   On: 09/09/2016 09:41   Ct Abdomen Pelvis W  Contrast  Result Date: 09/09/2016 CLINICAL DATA:  History of breast cancer progressed to the lymph nodes. Right-sided abdominal pain with nausea EXAM: CT ABDOMEN AND PELVIS WITH CONTRAST TECHNIQUE: Multidetector CT imaging of the abdomen and pelvis was performed using the standard protocol following bolus administration of intravenous contrast. CONTRAST:  115mL ISOVUE-300 IOPAMIDOL (ISOVUE-300) INJECTION 61% COMPARISON:  05/08/2016, 11/01/2015 FINDINGS: Lower chest: Patchy dependent atelectasis. Hazy atelectasis versus infiltrate in the left lower lobe. No effusion. No discrete nodules. Hepatobiliary: Diffuse decreased density of the hepatic parenchyma consistent with fatty infiltration. A focal hypodensity near the falciform ligament is unchanged and could represent more focal geographic fatty change. No discrete enhancing hepatic mass visualized. No calcified stones in the gallbladder. No biliary dilatation. Pancreas: Unremarkable. No pancreatic ductal dilatation or surrounding inflammatory changes. Spleen: Normal in size without focal abnormality. Adrenals/Urinary Tract: Adrenal glands are unremarkable. Kidneys are normal, without renal calculi, focal lesion, or hydronephrosis. Bladder is unremarkable. Stomach/Bowel: Stomach is nondilated. There is no dilated small bowel. Appendix is visualized and is normal. Vascular/Lymphatic: Non aneurysmal aorta. Slight interval increase in size of periportal lymph nodes. The larger of the 2 nodes measures 1.2 cm, compared with 8 mm previously. A node anterior to the pancreatic head neck measures 1 cm compared with 7 cm previous. No significantly enlarged retroperitoneal or pelvic nodes. Reproductive: Multiple calcified fibroids within the uterus. No gross adnexal masses. Other: No free air or free fluid. Musculoskeletal: Extensive foci of sclerosis involving the femurs, pelvic bones, and spine, compatible with skeletal metastatic disease. IMPRESSION: 1. Fatty infiltration  of the liver. No definitive focal hepatic mass visualized. 2. Slight interval increase in periportal adenopathy, metastatic disease cannot be excluded. 3. Calcified uterine fibroids 4. 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 Electronically Signed   By: Donavan Foil M.D.   On: 09/09/2016 01:51    Scheduled Meds: . buPROPion  300 mg Oral Daily  . calcium carbonate  500 mg of elemental calcium Oral Daily  . cholecalciferol  1,000 Units Oral Daily  . citalopram  10 mg Oral Daily  . docusate sodium  100 mg Oral BID  . enoxaparin (LOVENOX) injection  40 mg Subcutaneous Q24H  . [START ON 09/11/2016] fentaNYL  25 mcg Transdermal Q72H  . gabapentin  300 mg Oral TID  . letrozole  2.5 mg Oral Daily  . LORazepam  1 mg Oral Daily  . LORazepam  2 mg Oral QHS  . pantoprazole  40 mg Oral Daily  . zolpidem  5 mg Oral QHS  Continuous Infusions: . sodium chloride 100 mL/hr at 09/10/16 1200     LOS: 1 day   Ranyia Witting, Orpah Melter, MD [facility] Pager [phone]  If 7PM-7AM, please contact night-coverage [email or web address] Password [facility] 09/10/2016, 4:37 PM

## 2016-09-11 ENCOUNTER — Inpatient Hospital Stay (HOSPITAL_COMMUNITY): Payer: Medicare Other

## 2016-09-11 ENCOUNTER — Encounter (HOSPITAL_COMMUNITY): Payer: Self-pay | Admitting: Gastroenterology

## 2016-09-11 DIAGNOSIS — D702 Other drug-induced agranulocytosis: Secondary | ICD-10-CM

## 2016-09-11 DIAGNOSIS — R740 Nonspecific elevation of levels of transaminase and lactic acid dehydrogenase [LDH]: Secondary | ICD-10-CM

## 2016-09-11 DIAGNOSIS — C7951 Secondary malignant neoplasm of bone: Secondary | ICD-10-CM

## 2016-09-11 DIAGNOSIS — C7800 Secondary malignant neoplasm of unspecified lung: Secondary | ICD-10-CM

## 2016-09-11 DIAGNOSIS — C50512 Malignant neoplasm of lower-outer quadrant of left female breast: Secondary | ICD-10-CM

## 2016-09-11 LAB — IRON AND TIBC
Iron: 92 ug/dL (ref 28–170)
SATURATION RATIOS: 31 % (ref 10.4–31.8)
TIBC: 301 ug/dL (ref 250–450)
UIBC: 209 ug/dL

## 2016-09-11 LAB — COMPREHENSIVE METABOLIC PANEL
ALBUMIN: 3.7 g/dL (ref 3.5–5.0)
ALK PHOS: 102 U/L (ref 38–126)
ALT: 239 U/L — AB (ref 14–54)
AST: 120 U/L — AB (ref 15–41)
Anion gap: 6 (ref 5–15)
BILIRUBIN TOTAL: 5.6 mg/dL — AB (ref 0.3–1.2)
CALCIUM: 9.1 mg/dL (ref 8.9–10.3)
CO2: 25 mmol/L (ref 22–32)
CREATININE: 0.57 mg/dL (ref 0.44–1.00)
Chloride: 108 mmol/L (ref 101–111)
GFR calc Af Amer: >60 mL/min (ref >60)
GLUCOSE: 124 mg/dL — AB (ref 65–99)
Potassium: 3.4 mmol/L — ABNORMAL LOW (ref 3.5–5.1)
Sodium: 139 mmol/L (ref 135–145)
TOTAL PROTEIN: 6.7 g/dL (ref 6.5–8.1)

## 2016-09-11 LAB — GLUCOSE, CAPILLARY: GLUCOSE-CAPILLARY: 151 mg/dL — AB (ref 65–99)

## 2016-09-11 LAB — HIV ANTIBODY (ROUTINE TESTING W REFLEX): HIV SCREEN 4TH GENERATION: NONREACTIVE

## 2016-09-11 MED ORDER — MORPHINE SULFATE (PF) 2 MG/ML IV SOLN
1.0000 mg | INTRAVENOUS | Status: DC | PRN
  Administered 2016-09-11 – 2016-09-13 (×7): 1 mg via INTRAVENOUS
  Filled 2016-09-11 (×8): qty 1

## 2016-09-11 MED ORDER — GADOBENATE DIMEGLUMINE 529 MG/ML IV SOLN
20.0000 mL | Freq: Once | INTRAVENOUS | Status: AC | PRN
  Administered 2016-09-11: 20 mL via INTRAVENOUS

## 2016-09-11 MED ORDER — ALUM & MAG HYDROXIDE-SIMETH 200-200-20 MG/5ML PO SUSP
30.0000 mL | Freq: Four times a day (QID) | ORAL | Status: DC | PRN
  Administered 2016-09-11: 30 mL via ORAL
  Filled 2016-09-11: qty 30

## 2016-09-11 MED ORDER — SODIUM CHLORIDE 0.9% FLUSH: 10.0000 mL | INTRAVENOUS | Status: DC | PRN

## 2016-09-11 MED ORDER — MAGNESIUM CITRATE PO SOLN
1.0000 | Freq: Once | ORAL | Status: AC
  Administered 2016-09-11: 1 via ORAL
  Filled 2016-09-11: qty 296

## 2016-09-11 MED ORDER — MORPHINE SULFATE (PF) 2 MG/ML IV SOLN
2.0000 mg | Freq: Once | INTRAVENOUS | Status: AC
  Administered 2016-09-11: 2 mg via INTRAVENOUS
  Filled 2016-09-11: qty 1

## 2016-09-11 NOTE — Progress Notes (Signed)
HEMATOLOGY-ONCOLOGY PROGRESS NOTE  SUBJECTIVE: Patient is admitted to the hospital with elevated liver function tests especially elevated bilirubin and right upper quadrant abdominal pain. 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      07/03/2011 - 08/17/2011 Radiation Therapy    Radiation therapy to the chest and axilla      09/11/2011 -  Anti-estrogen oral therapy    Tamoxifen later switched to Arimidex 07/2012      04/22/2012 Surgery    Bilateral salpingo-oophorectomy      06/10/2015 Imaging    Bone scan: uptake right lateral frontal bone, left malar region, manubrium, right 6,8 ribs; CT-CAP: New extensive infiltrate left & right hilar/mediastinal, right axillary, bil lower neck LN, 3.7 cm LUL consolidation, RML nodules,Lt Pl eff, RPLN      06/18/2015 Initial Biopsy    Right axillary lymph node biopsy: 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, ER 90%, PR 0%, Ki-67 60%, HER-2 negative      06/29/2015 - 10/19/2015 Chemotherapy    [place] days 1 and 8 Q 3 weeks 6 cycles      11/01/2015 PET scan    Focal patchy LUL opacities new/increased possibly reflecting infection or tumor, mild improving hypermet LN in Rt paratracheal and left infrahilar region, subcut mets to right shoulder and right post gluteal, ext bone mets      11/08/2015 -  Anti-estrogen oral therapy    Ibrance with Letrozole      12/30/2015 - 01/07/2016 Radiation Therapy    Palliative radiation to the neck (stopped  early due to esophagitis)       OBJECTIVE: REVIEW OF SYSTEMS:   Constitutional: Denies fevers, chills or abnormal weight loss, complains of severe fatigue Eyes: Denies blurriness of vision Ears, nose, mouth, throat, and face: Denies mucositis or sore throat Respiratory: Denies cough, dyspnea or wheezes Cardiovascular: Denies palpitation, chest discomfort Gastrointestinal: Abdominal bloating and right upper quadrant pain and discomfort Skin: Denies abnormal skin rashes Lymphatics: Denies new lymphadenopathy or easy bruising Neurological:Denies numbness, tingling or new weaknesses Behavioral/Psych: Mood is stable, no new changes  Extremities: No lower extremity edema All other systems were reviewed with the patient and are negative.  I have reviewed the past medical history, past surgical history, social history and family history with the patient and they are unchanged from previous note.   PHYSICAL EXAMINATION: ECOG PERFORMANCE STATUS: 2 - Symptomatic, <50% confined to bed  Vitals:   09/11/16 0500 09/11/16 1252  BP: 130/87 128/89  Pulse: 95 84  Resp: 20 18  Temp: 98.9 F (37.2 C) 98.7 F (37.1 C)   Filed Weights   09/08/16 1843 09/09/16 0127  Weight: 218 lb (98.9 kg) 213 lb 3 oz (96.7 kg)    GENERAL:alert, no distress and comfortable SKIN: skin color, texture, turgor are normal, no rashes or significant lesions EYES: normal, Conjunctiva are pink and non-injected, sclera clear OROPHARYNX:no exudate, no erythema and lips, buccal mucosa, and tongue normal  NECK:  supple, thyroid normal size, non-tender, without nodularity LYMPH:  no palpable lymphadenopathy in the cervical, axillary or inguinal LUNGS: clear to auscultation and percussion with normal breathing effort HEART: regular rate & rhythm and no murmurs and no lower extremity edema ABDOMEN:abdomen soft, Right upper quadrant abdominal tenderness Musculoskeletal:no cyanosis of digits and no clubbing  NEURO: alert &  oriented x 3 with fluent speech, no focal motor/sensory deficits  LABORATORY DATA:  I have reviewed the data as listed CMP Latest Ref Rng & Units 09/11/2016 09/10/2016 09/09/2016  Glucose 65 - 99 mg/dL 124(H) 115(H) 98  BUN 6 - 20 mg/dL <5(L) <5(L) <5(L)  Creatinine 0.44 - 1.00 mg/dL 0.57 0.47 0.57  Sodium 135 - 145 mmol/L 139 139 138  Potassium 3.5 - 5.1 mmol/L 3.4(L) 3.7 2.9(L)  Chloride 101 - 111 mmol/L 108 110 110  CO2 22 - 32 mmol/L '25 24 23  '$ Calcium 8.9 - 10.3 mg/dL 9.1 8.4(L) 7.9(L)  Total Protein 6.5 - 8.1 g/dL 6.7 6.5 6.4(L)  Total Bilirubin 0.3 - 1.2 mg/dL 5.6(H) 5.2(H) 5.1(H)  Alkaline Phos 38 - 126 U/L 102 92 93  AST 15 - 41 U/L 120(H) 108(H) 104(H)  ALT 14 - 54 U/L 239(H) 238(H) 271(H)    Lab Results  Component Value Date   WBC 2.0 (L) 09/10/2016   HGB 11.4 (L) 09/10/2016   HCT 32.8 (L) 09/10/2016   MCV 92.1 09/10/2016   PLT 140 (L) 09/10/2016   NEUTROABS 1.8 09/08/2016    ASSESSMENT AND PLAN: 1. Elevated bilirubin: Unclear etiology. Mostly direct bilirubin suggestive of obstruction. Gastroenteritis been consulted. Dr. Watt Climes is following. MRI of the abdomen will be obtained today. 2. sudden elevation of AST and ALT: Slow improvement in their levels. 3. Metastatic breast cancer with bone and lung metastases: CT of the abdomen does not reveal any liver metastases. Overall I believe Ibrance and letrozole at working well. I reviewed both of these medications and neither of them cause elevation of liver function tests. 4. Neutropenia: Due to Ibrance. This is expected. Leslee Home is currently on hold and patient pharmacy.

## 2016-09-11 NOTE — Progress Notes (Signed)
Patient already on CPAP. Patient is tolerating well. RT will continue to monitor.

## 2016-09-11 NOTE — Progress Notes (Signed)
PROGRESS NOTE    Tina Vaughan  [id number] DOB: 08/24/1961 DOA: 09/08/2016 PCP: Mackie Pai, PA-C    Brief Narrative:  55 y.o. female with medical history significant of metastatic breast cancer (s/p of surgery and currently on Ibrance and letrozole), GERD, depression, anxiety, OSA on CPAP, edema, who presents with abdominal pain.  Pt states that she has been having abdominal pain in the past 5 days, which is new. Her abdominal pain is located in the right upper quadrant, constant, 5 out of 10 in severity, radiating to the right back. She also has nausea, but no vomiting. She has been having diarrhea in the past 4 days. She states that she was on stool softener, but she continues to have diarrhea after she stopped stool softer 3 days ago. She has 5-7 bowel movements with loose stool each day. No fever or chills. Patient denies symptoms of UTI. She does not have chest pain, shortness of breath, cough, unilateral weakness  Assessment & Plan:   Principal Problem:   Abdominal pain Active Problems:   Breast cancer of lower-outer quadrant of left female breast ([facility])   Generalized anxiety disorder   Bone metastasis ([facility])   Depression   Abnormal liver function   Diarrhea  Abdominal pain and Abnormal liver function: -Presenting ALP 101, AST 110, ALT 311 and total bilirubin 5.1. -CT abd with fatty liver, no focal mass seen. [name] and CT reviewed. -Abd [name] with fatty liver, no cholelithiasis or evidence of acute cholecystitis -LFT's have risen overnight and pt with increased abd pain -Ordered abd xray. Increased stool burden, most notably at hepatic flexure per my read -Have formally consulted GI for assistance -Records reviewed. Pt is planned for MRCP today. Will follow up on results  Breast cancer of lower-outer quadrant of left female breast [facility]):  -s/p lateral mastectomy -Patient is followed by Dr. Lindi Adie -Patient continued on Ibrance and letrozole -Have consulted Dr.  Lindi Adie  Depression and anxiety:  -Patient does seem -Will continue Wellbutrin, Celexa and Ativan   Recent Diarrhea:  -No further stools since admission -On further questioning, patient states that her diarrhea started after taking laxatives. -Doubt C. difficile colitis. -Xray abd with evidence of constipation  Positive urinalysis:  -Patient presented with UA suggestive of possible UTI -Patient is now s/p fosfomycin  -Urine culture with multiple species present -Still complaining of nausea and abd pain, however suspect constipation per above  DVT prophylaxis: Lovenox Code Status: DNR Family Communication: Pt in room, family not at bedside Disposition Plan: Uncertain at this time  Consultants:  Gastroenterology Oncology  Procedures:     Antimicrobials: Anti-infectives    None      Subjective: Complaining of increased RUQ pain and some nausea  Objective: Vitals:   09/10/16 2145 09/10/16 2156 09/11/16 0500 09/11/16 1252  BP:  (!) 130/92 130/87 128/89  Pulse:  95 95 84  Resp: 18 16 20 18   Temp:  98.8 F (37.1 C) 98.9 F (37.2 C) 98.7 F (37.1 C)  TempSrc:  Oral Oral Oral  SpO2:  98% 97% 96%  Weight:      Height:        Intake/Output Summary (Last 24 hours) at 09/11/16 1430 Last data filed at 09/11/16 1100  Gross per 24 hour  Intake           2298.3 ml  Output             1000 ml  Net  1298.3 ml   Filed Weights   09/08/16 1843 09/09/16 0127  Weight: 98.9 kg (218 lb) 96.7 kg (213 lb 3 oz)    Examination:  General exam:Sitting up in bed, attempting to eat breakfast, appears to be uncomfortable Respiratory system: Normal chest rise, no crackles Cardiovascular system: Regular rhythm, S1-S2 Gastrointestinal system: Decreased bowel sounds, tender over right upper quadrant, appears mildly distended Central nervous system: No tremors, no seizures Extremities: No joint deformities, no cyanosis Skin: No pallor, no notable skin lesions  seen Psychiatry: Normal affect, no auditory hallucinations  Data Reviewed: I have personally reviewed following labs and imaging studies  CBC:  Recent Labs Lab 09/05/16 1036 09/07/16 0951 09/08/16 2147 09/09/16 0130 09/10/16 0415 09/10/16 0840  WBC 2.2* 2.2* 2.9* 2.7* 2.1* 2.0*  NEUTROABS 1.3* 1.4* 1.8  --   --   --   HGB 12.5 12.3 12.4 11.6* 11.6* 11.4*  HCT 36.2 34.9 35.0* 32.9* 33.8* 32.8*  MCV 94.3 92.6 91.1 93.5 94.2 92.1  PLT 207 139* 153 134* 146* [id number]*   Basic Metabolic Panel:  Recent Labs Lab 09/07/16 0951 09/08/16 2147 09/09/16 0130 09/10/16 0840 09/11/16 0440  NA 143 141 138 139 139  K 3.5 3.3* 2.9* 3.7 3.4*  CL  --  110 110 110 108  CO2 24 24 23 24 25   GLUCOSE 119 99 98 115* 124*  BUN 6.6* <5* <5* <5* <5*  CREATININE 0.7 0.54 0.57 0.47 0.57  CALCIUM 8.6 8.5* 7.9* 8.4* 9.1   GFR: Estimated Creatinine Clearance: 87.2 mL/min (by C-G formula based on SCr of 0.57 mg/dL). Liver Function Tests:  Recent Labs Lab 09/07/16 0951 09/08/16 2147 09/09/16 0130 09/10/16 0840 09/11/16 0440  AST 205* 110* 104* 108* 120*  ALT 507* 311* 271* 238* 239*  ALKPHOS 128 101 93 92 102  BILITOT 3.99* 5.1* 5.1* 5.2* 5.6*  PROT 7.0 6.9 6.4* 6.5 6.7  ALBUMIN 3.5 4.0 3.5 3.6 3.7    Recent Labs Lab 09/08/16 2147  LIPASE 26    Recent Labs Lab 09/08/16 2147  AMMONIA 46*   Coagulation Profile:  Recent Labs Lab 09/08/16 2147  INR 0.97   Cardiac Enzymes: No results for input(s): CKTOTAL, CKMB, CKMBINDEX, TROPONINI in the last 168 hours. BNP (last 3 results) No results for input(s): PROBNP in the last 8760 hours. HbA1C: No results for input(s): HGBA1C in the last 72 hours. CBG:  Recent Labs Lab 09/09/16 0800 09/10/16 0804 09/11/16 0751  GLUCAP 93 104* 151*   Lipid Profile: No results for input(s): CHOL, HDL, LDLCALC, TRIG, CHOLHDL, LDLDIRECT in the last 72 hours. Thyroid Function Tests: No results for input(s): TSH, T4TOTAL, FREET4, T3FREE, THYROIDAB  in the last 72 hours. Anemia Panel:  Recent Labs  09/11/16 0440  TIBC 301  IRON 92   Sepsis Labs:  Recent Labs Lab 09/09/16 0300  LATICACIDVEN 0.7    Recent Results (from the past 240 hour(s))  Urine culture     Status: Abnormal   Collection Time: 09/08/16 11:51 PM  Result Value Ref Range Status   Specimen Description URINE, CLEAN CATCH  Final   Special Requests NONE  Final   Culture MULTIPLE SPECIES PRESENT, SUGGEST RECOLLECTION (A)  Final   Report Status 09/10/2016 FINAL  Final     Radiology Studies: Dg Abd Portable 1v  Result Date: 09/11/2016 CLINICAL DATA:  History of bowel obstruction, right upper quadrant pain with nausea today. EXAM: PORTABLE ABDOMEN - 1 VIEW COMPARISON:  Abdominal and pelvic CT scan of September 09, 2016 FINDINGS: The colonic stool burden is moderate especially in the right colon. There is no small or large bowel obstructive pattern. No free extraluminal gas collections are observed calcifications in the pelvis likely reflect fibroids. Sclerotic foci are noted in both hips and throughout the bony pelvis and in the lumbar spine. The lung bases are grossly clear. IMPRESSION: Mildly increase colonic stool burden predominantly on the right without evidence of large or small bowel obstruction or perforation. Diffuse metastatic disease involving the lumbar spine, pelvis, and hips. Electronically Signed   By: David  Martinique M.D.   On: 09/11/2016 10:21    Scheduled Meds: . buPROPion  300 mg Oral Daily  . calcium carbonate  500 mg of elemental calcium Oral Daily  . cholecalciferol  1,000 Units Oral Daily  . citalopram  10 mg Oral Daily  . docusate sodium  100 mg Oral BID  . enoxaparin (LOVENOX) injection  40 mg Subcutaneous Q24H  . fentaNYL  25 mcg Transdermal Q72H  . gabapentin  300 mg Oral TID  . letrozole  2.5 mg Oral Daily  . LORazepam  1 mg Oral Daily  . LORazepam  2 mg Oral QHS  . pantoprazole  40 mg Oral Daily  . zolpidem  5 mg Oral QHS    Continuous Infusions: . sodium chloride 100 mL/hr at 09/11/16 1222     LOS: 2 days   Jawan Chavarria, Orpah Melter, MD [facility] Pager [phone]  If 7PM-7AM, please contact night-coverage [email or web address] Password [email or web address] 09/11/2016, 2:30 PM

## 2016-09-11 NOTE — Consult Note (Signed)
Reason for Consult: Abnormal liver tests abdominal pain Referring Physician: Hospital team  Tina Vaughan is an 55 y.o. female.  HPI: Patient seen and examined and case discussed with her sister and her hospital computer chart reviewed and she has not seen a GI doctor before and her abdominal pain started a few weeks ago when her urine got dark stools got light and her eyes Yellow and she was found to be jaundiced but CT and ultrasound have not been helpful and she is feeling better on the pain medicine and her mother had pancreatic cancer but no GI or liver problems run in the family and she's never been told her liver tests were elevated and they were normal a few weeks ago and she is on multiple medicines and chemotherapy as well and has no other complaints  Past Medical History:  Diagnosis Date  . Anxiety   . Breast cancer, stage 3 ([place]) 2012/ 05/2015   left; inflammatory  . Depression   . Eczema   . Frequent urination   . GERD (gastroesophageal reflux disease)   . Headache   . Lymphedema   . Neuromuscular disorder ([place])   . 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 Obesity   . Sleep apnea   . Thyroid disease    Possible would like to be evaluated    Past Surgical History:  Procedure Laterality Date  . LAPAROSCOPY  03/14/2012   Procedure: LAPAROSCOPY OPERATIVE;  Surgeon: Osborne Oman, MD;  Location: [facility];  Service: Gynecology;  Laterality: N/A;  . MASTECTOMY Bilateral 05/12/11   total mastectomy on right, radical mastectomy on left  . OOPHORECTOMY    . PORT-A-CATH REMOVAL  04/19/2012   Procedure: MINOR REMOVAL PORT-A-CATH;  Surgeon: Haywood Lasso, MD;  Location: [facility];  Service: General;  Laterality: N/A;  . Portacath     Placement   . PORTACATH PLACEMENT    . SALPINGOOPHORECTOMY  03/14/2012   Procedure: SALPINGO OOPHERECTOMY;  Surgeon: Osborne Oman, MD;  Location: [facility];  Service: Gynecology;  Laterality: Bilateral;    Family  History  Problem Relation Age of Onset  . Breast cancer Mother   . Pancreatic cancer Mother     pancreatic cancer x 2  . Diabetes Mother     after pancreas removed  . Stroke Father     heavy smoker  . Hypertension Father   . Heart disease Father   . Healthy Sister   . Migraines Sister   . Emphysema Paternal Grandfather     smoked a pipe  . Colon cancer Neg Hx   . Esophageal cancer Neg Hx   . Colitis Neg Hx   . Crohn's disease Neg Hx     Social History:  reports that she has never smoked. She has never used smokeless tobacco. She reports that she drinks alcohol. She reports that she does not use drugs.  Allergies:  Allergies  Allergen Reactions  . Doxycycline Hydrochloride Nausea And Vomiting    Medications: I have reviewed the patient's current medications.  Results for orders placed or performed during the hospital encounter of 09/08/16 (from the past 48 hour(s))  CBC     Status: Abnormal   Collection Time: 09/10/16  4:15 AM  Result Value Ref Range   WBC 2.1 (L) 4.0 - 10.5 K/uL   RBC 3.59 (L) 3.87 - 5.11 MIL/uL   Hemoglobin 11.6 (L) 12.0 - 15.0 g/dL   HCT  33.8 (L) 36.0 - 46.0 %   MCV 94.2 78.0 - 100.0 fL   MCH 32.3 26.0 - 34.0 pg   MCHC 34.3 30.0 - 36.0 g/dL   RDW 14.8 11.5 - 15.5 %   Platelets 146 (L) 150 - 400 K/uL  Glucose, capillary     Status: Abnormal   Collection Time: 09/10/16  8:04 AM  Result Value Ref Range   Glucose-Capillary 104 (H) 65 - 99 mg/dL  CBC     Status: Abnormal   Collection Time: 09/10/16  8:40 AM  Result Value Ref Range   WBC 2.0 (L) 4.0 - 10.5 K/uL   RBC 3.56 (L) 3.87 - 5.11 MIL/uL   Hemoglobin 11.4 (L) 12.0 - 15.0 g/dL   HCT 32.8 (L) 36.0 - 46.0 %   MCV 92.1 78.0 - 100.0 fL   MCH 32.0 26.0 - 34.0 pg   MCHC 34.8 30.0 - 36.0 g/dL   RDW 14.7 11.5 - 15.5 %   Platelets 140 (L) 150 - 400 K/uL  Comprehensive metabolic panel     Status: Abnormal   Collection Time: 09/10/16  8:40 AM  Result Value Ref Range   Sodium 139 135 - 145 mmol/L    Potassium 3.7 3.5 - 5.1 mmol/L    Comment: DELTA CHECK NOTED REPEATED TO VERIFY NO VISIBLE HEMOLYSIS    Chloride 110 101 - 111 mmol/L   CO2 24 22 - 32 mmol/L   Glucose, Bld 115 (H) 65 - 99 mg/dL   BUN <5 (L) 6 - 20 mg/dL   Creatinine, Ser 0.47 0.44 - 1.00 mg/dL   Calcium 8.4 (L) 8.9 - 10.3 mg/dL   Total Protein 6.5 6.5 - 8.1 g/dL   Albumin 3.6 3.5 - 5.0 g/dL   AST 108 (H) 15 - 41 U/L   ALT 238 (H) 14 - 54 U/L   Alkaline Phosphatase 92 38 - 126 U/L   Total Bilirubin 5.2 (H) 0.3 - 1.2 mg/dL   GFR calc non Af Amer >60 >60 mL/min   GFR calc Af Amer >60 >60 mL/min    Comment: (NOTE) The eGFR has been calculated using the CKD EPI equation. This calculation has not been validated in all clinical situations. eGFR's persistently <60 mL/min signify possible Chronic Kidney Disease.    Anion gap 5 5 - 15  Iron and TIBC     Status: None   Collection Time: 09/11/16  4:40 AM  Result Value Ref Range   Iron 92 28 - 170 ug/dL   TIBC 301 250 - 450 ug/dL   Saturation Ratios 31 10.4 - 31.8 %   UIBC 209 ug/dL    Comment: Performed at [facility]  Comprehensive metabolic panel     Status: Abnormal   Collection Time: 09/11/16  4:40 AM  Result Value Ref Range   Sodium 139 135 - 145 mmol/L   Potassium 3.4 (L) 3.5 - 5.1 mmol/L   Chloride 108 101 - 111 mmol/L   CO2 25 22 - 32 mmol/L   Glucose, Bld 124 (H) 65 - 99 mg/dL   BUN <5 (L) 6 - 20 mg/dL   Creatinine, Ser 0.57 0.44 - 1.00 mg/dL   Calcium 9.1 8.9 - 10.3 mg/dL   Total Protein 6.7 6.5 - 8.1 g/dL   Albumin 3.7 3.5 - 5.0 g/dL   AST 120 (H) 15 - 41 U/L   ALT 239 (H) 14 - 54 U/L   Alkaline Phosphatase 102 38 - 126 U/L   Total  Bilirubin 5.6 (H) 0.3 - 1.2 mg/dL   GFR calc non Af Amer >60 >60 mL/min   GFR calc Af Amer >60 >60 mL/min    Comment: (NOTE) The eGFR has been calculated using the CKD EPI equation. This calculation has not been validated in all clinical situations. eGFR's persistently <60 mL/min signify possible Chronic  Kidney Disease.    Anion gap 6 5 - 15  Glucose, capillary     Status: Abnormal   Collection Time: 09/11/16  7:51 AM  Result Value Ref Range   Glucose-Capillary 151 (H) 65 - 99 mg/dL    Dg Abd Portable 1v  Result Date: 09/11/2016 CLINICAL DATA:  History of bowel obstruction, right upper quadrant pain with nausea today. EXAM: PORTABLE ABDOMEN - 1 VIEW COMPARISON:  Abdominal and pelvic CT scan of September 09, 2016 FINDINGS: The colonic stool burden is moderate especially in the right colon. There is no small or large bowel obstructive pattern. No free extraluminal gas collections are observed calcifications in the pelvis likely reflect fibroids. Sclerotic foci are noted in both hips and throughout the bony pelvis and in the lumbar spine. The lung bases are grossly clear. IMPRESSION: Mildly increase colonic stool burden predominantly on the right without evidence of large or small bowel obstruction or perforation. Diffuse metastatic disease involving the lumbar spine, pelvis, and hips. Electronically Signed   By: David  Martinique M.D.   On: 09/11/2016 10:21    ROS negative except above Blood pressure 130/87, pulse 95, temperature 98.9 F (37.2 C), temperature source Oral, resp. rate 20, height 5' 2" (1.575 m), weight 96.7 kg (213 lb 3 oz), last menstrual period 05/08/2016, SpO2 97 %. Physical Exam vital signs stable afebrile no acute distress lungs clear regular rate and rhythm abdomen is soft nontender good bowel sounds no pedal edema labs ultrasound and CT reviewed previous PET scan reviewed  Assessment/Plan: Multiple medical problems including upper abdominal pain and elevated liver tests questionable etiology Plan: Continue workup with an MRCP and would stop all unnecessary medicines or any new medicines in the last month and consider ERCP EUS or liver biopsy depending on MRCP and she can get in the MRI scanner which she has had in the past  [place]odge E 09/11/2016, 12:26 PM

## 2016-09-12 LAB — COMPREHENSIVE METABOLIC PANEL
ALT: 230 U/L — AB (ref 14–54)
ANION GAP: 8 (ref 5–15)
AST: 131 U/L — ABNORMAL HIGH (ref 15–41)
Albumin: 3.6 g/dL (ref 3.5–5.0)
Alkaline Phosphatase: 102 U/L (ref 38–126)
BUN: <5 mg/dL — ABNORMAL LOW (ref 6–20)
CHLORIDE: 104 mmol/L (ref 101–111)
CO2: 26 mmol/L (ref 22–32)
Calcium: 8.9 mg/dL (ref 8.9–10.3)
Creatinine, Ser: 0.44 mg/dL (ref 0.44–1.00)
GFR calc non Af Amer: >60 mL/min (ref >60)
Glucose, Bld: 122 mg/dL — ABNORMAL HIGH (ref 65–99)
POTASSIUM: 3.5 mmol/L (ref 3.5–5.1)
SODIUM: 138 mmol/L (ref 135–145)
Total Bilirubin: 6.3 mg/dL — ABNORMAL HIGH (ref 0.3–1.2)
Total Protein: 6.7 g/dL (ref 6.5–8.1)

## 2016-09-12 LAB — GLUCOSE, CAPILLARY: Glucose-Capillary: 111 mg/dL — ABNORMAL HIGH (ref 65–99)

## 2016-09-12 LAB — FANA STAINING PATTERNS

## 2016-09-12 LAB — MITOCHONDRIAL ANTIBODIES: Mitochondrial M2 Ab, IgG: 6.4 Units (ref 0.0–20.0)

## 2016-09-12 LAB — ANTI-SMOOTH MUSCLE ANTIBODY, IGG: F-ACTIN AB IGG: 14 U (ref 0–19)

## 2016-09-12 LAB — CBC
HCT: 33.5 % — ABNORMAL LOW (ref 36.0–46.0)
HEMOGLOBIN: 11.8 g/dL — AB (ref 12.0–15.0)
MCH: 33 pg (ref 26.0–34.0)
MCHC: 35.2 g/dL (ref 30.0–36.0)
MCV: 93.6 fL (ref 78.0–100.0)
Platelets: 135 10*3/uL — ABNORMAL LOW (ref 150–400)
RBC: 3.58 MIL/uL — AB (ref 3.87–5.11)
RDW: 14.7 % (ref 11.5–15.5)
WBC: 2.2 10*3/uL — ABNORMAL LOW (ref 4.0–10.5)

## 2016-09-12 LAB — CMV IGM: CMV IgM: <30 AU/mL (ref 0.0–29.9)

## 2016-09-12 LAB — EPSTEIN-BARR VIRUS NUCLEAR ANTIGEN ANTIBODY, IGG: EBV NA IgG: 222 U/mL — ABNORMAL HIGH (ref 0.0–17.9)

## 2016-09-12 LAB — ANTINUCLEAR ANTIBODIES, IFA: ANTINUCLEAR ANTIBODIES, IFA: POSITIVE — AB

## 2016-09-12 MED ORDER — ENOXAPARIN SODIUM 40 MG/0.4ML ~~LOC~~ SOLN
40.0000 mg | SUBCUTANEOUS | Status: DC
  Administered 2016-09-14 – 2016-09-21 (×7): 40 mg via SUBCUTANEOUS
  Filled 2016-09-12 (×7): qty 0.4

## 2016-09-12 MED ORDER — MAGNESIUM CITRATE PO SOLN
1.0000 | Freq: Once | ORAL | Status: AC
  Administered 2016-09-12: 1 via ORAL
  Filled 2016-09-12: qty 296

## 2016-09-12 NOTE — Progress Notes (Signed)
Chaplain paged to provide support for Ms Tina Vaughan to complete a [place] of Attorney/Living Will prior to her surgery on September 13, 2016. Ms Tina Vaughan filed out the required forms, two unrelated witnesses were found, and the [facility] acted as the Patent examiner. Copies of the completed notarized forms were placed in Ms Tina Vaughan' paper chart.  Prayer and emotional comfort was provided by the chaplain  Juanda Crumble D. Tanish Prien, [place]

## 2016-09-12 NOTE — Progress Notes (Signed)
Chaplain paged due to patient's request to create a [place] prior to surgery on September 13, 2016. Chaplain arrived and navigated the paperwork with the patient. Patient very emotional but happy to have her wished on paper.

## 2016-09-12 NOTE — Progress Notes (Signed)
Tina Vaughan 10:10 AM  Subjective: Patient without any new complaints although she had some vomiting yesterday she is tolerating clear liquids today and did move her bowels which did not change her pain  Objective: Vital signs stable afebrile no acute distress abdomen is soft nontender occasional bowel sounds bili slight increase other labs stable MRCP reviewed looks like distal CBD obstruction without dilation  Assessment: Probable CBD obstruction questionable cause  Plan: The risks benefits methods and options and success rate of ERCP with possible biopsy and stenting and probable sphincterotomy was discussed extensively with the patient and we answered all of her questions and will proceed tomorrow at 11:30 with further workup and plans pending those findings  [facility]  Pager [phone] After 5PM or if no answer call [phone]

## 2016-09-12 NOTE — Progress Notes (Signed)
PROGRESS NOTE    Tina Vaughan  [id number] DOB: February 11, 1961 DOA: 09/08/2016 PCP: Mackie Pai, PA-C    Brief Narrative:  55 y.o. female with medical history significant of metastatic breast cancer (s/p of surgery and currently on Ibrance and letrozole), GERD, depression, anxiety, OSA on CPAP, edema, who presents with abdominal pain.  Pt states that she has been having abdominal pain in the past 5 days, which is new. Her abdominal pain is located in the right upper quadrant, constant, 5 out of 10 in severity, radiating to the right back. She also has nausea, but no vomiting. She has been having diarrhea in the past 4 days. She states that she was on stool softener, but she continues to have diarrhea after she stopped stool softer 3 days ago. She has 5-7 bowel movements with loose stool each day. No fever or chills. Patient denies symptoms of UTI. She does not have chest pain, shortness of breath, cough, unilateral weakness  Assessment & Plan:   Principal Problem:   Abdominal pain Active Problems:   Breast cancer of lower-outer quadrant of left female breast (HCC)   Generalized anxiety disorder   Bone metastasis (HCC)   Depression   Abnormal liver function   Diarrhea  Abdominal pain and Abnormal liver function: -Presenting ALP 101, AST 110, ALT 311 and total bilirubin 5.1. -CT abd with fatty liver, no focal mass seen. [name] and CT reviewed. -Abd [name] with fatty liver, no cholelithiasis or evidence of acute cholecystitis -GI consulted. Recommendations reviewed -Follow up MRCP demonstrated suspicion for CBD obstruction -LFT's remain elevated with rising bilirubin -Per GI, plans for ERCP on 11/1  Breast cancer of lower-outer quadrant of left female breast [facility]):  -Patient is s/p mastectomy -Seen by Dr. Lindi Adie -Patient is continued on Ibrance and letrozole  Depression and anxiety:  -Mood seems stable at present -Will continue Wellbutrin, Celexa and Ativan as tolerated  Recent  Diarrhea:  -No further diarrhea noted -Now with decreased stool output  Positive urinalysis:  -Initial UA was suggestive of possible UTI -Patient is now s/p fosfomycin  -Urine culture with multiple species present -Afebrile  DVT prophylaxis: Lovenox Code Status: DNR Family Communication: Pt in room, family not at bedside Disposition Plan: Uncertain at this time  Consultants:  Gastroenterology Oncology  Procedures:     Antimicrobials: Anti-infectives    None      Subjective: Still complaining of abd discomfort  Objective: Vitals:   09/11/16 0500 09/11/16 1252 09/11/16 2042 09/12/16 0450  BP: 130/87 128/89 (!) 143/90 125/80  Pulse: 95 84 89 87  Resp: 20 18 18 18   Temp: 98.9 F (37.2 C) 98.7 F (37.1 C) 98.8 F (37.1 C) 98.4 F (36.9 C)  TempSrc: Oral Oral Oral Oral  SpO2: 97% 96% 94% 96%  Weight:      Height:        Intake/Output Summary (Last 24 hours) at 09/12/16 1411 Last data filed at 09/12/16 1007  Gross per 24 hour  Intake          3156.67 ml  Output              300 ml  Net          2856.67 ml   Filed Weights   09/08/16 1843 09/09/16 0127  Weight: 98.9 kg (218 lb) 96.7 kg (213 lb 3 oz)    Examination:  General exam:laying in bed, appears uncomfortable, conversant Respiratory system: normal resp effort, no audible wheezing Cardiovascular system: regular rate, s1,  s2 Gastrointestinal system: Obese, tender over RUQ, decreased BS Central nervous system: CN2-12 grossly intact, strength intact Extremities: no clubbing, perfused Skin: no rashes, normal skin turgor Psychiatry: normal mood, no auditory/visual hallucinations  Data Reviewed: I have personally reviewed following labs and imaging studies  CBC:  Recent Labs Lab 09/07/16 0951 09/08/16 2147 09/09/16 0130 09/10/16 0415 09/10/16 0840 09/12/16 0540  WBC 2.2* 2.9* 2.7* 2.1* 2.0* 2.2*  NEUTROABS 1.4* 1.8  --   --   --   --   HGB 12.3 12.4 11.6* 11.6* 11.4* 11.8*  HCT 34.9 35.0*  32.9* 33.8* 32.8* 33.5*  MCV 92.6 91.1 93.5 94.2 92.1 93.6  PLT 139* 153 134* 146* 140* [id number]*   Basic Metabolic Panel:  Recent Labs Lab 09/08/16 2147 09/09/16 0130 09/10/16 0840 09/11/16 0440 09/12/16 0540  NA 141 138 139 139 138  K 3.3* 2.9* 3.7 3.4* 3.5  CL 110 110 110 108 104  CO2 24 23 24 25 26   GLUCOSE 99 98 115* 124* 122*  BUN <5* <5* <5* <5* <5*  CREATININE 0.54 0.57 0.47 0.57 0.44  CALCIUM 8.5* 7.9* 8.4* 9.1 8.9   GFR: Estimated Creatinine Clearance: 87.2 mL/min (by C-G formula based on SCr of 0.44 mg/dL). Liver Function Tests:  Recent Labs Lab 09/08/16 2147 09/09/16 0130 09/10/16 0840 09/11/16 0440 09/12/16 0540  AST 110* 104* 108* 120* 131*  ALT 311* 271* 238* 239* 230*  ALKPHOS 101 93 92 102 102  BILITOT 5.1* 5.1* 5.2* 5.6* 6.3*  PROT 6.9 6.4* 6.5 6.7 6.7  ALBUMIN 4.0 3.5 3.6 3.7 3.6    Recent Labs Lab 09/08/16 2147  LIPASE 26    Recent Labs Lab 09/08/16 2147  AMMONIA 46*   Coagulation Profile:  Recent Labs Lab 09/08/16 2147  INR 0.97   Cardiac Enzymes: No results for input(s): CKTOTAL, CKMB, CKMBINDEX, TROPONINI in the last 168 hours. BNP (last 3 results) No results for input(s): PROBNP in the last 8760 hours. HbA1C: No results for input(s): HGBA1C in the last 72 hours. CBG:  Recent Labs Lab 09/09/16 0800 09/10/16 0804 09/11/16 0751 09/12/16 0743  GLUCAP 93 104* 151* 111*   Lipid Profile: No results for input(s): CHOL, HDL, LDLCALC, TRIG, CHOLHDL, LDLDIRECT in the last 72 hours. Thyroid Function Tests: No results for input(s): TSH, T4TOTAL, FREET4, T3FREE, THYROIDAB in the last 72 hours. Anemia Panel:  Recent Labs  09/11/16 0440  TIBC 301  IRON 92   Sepsis Labs:  Recent Labs Lab 09/09/16 0300  LATICACIDVEN 0.7    Recent Results (from the past 240 hour(s))  Urine culture     Status: Abnormal   Collection Time: 09/08/16 11:51 PM  Result Value Ref Range Status   Specimen Description URINE, CLEAN CATCH  Final    Special Requests NONE  Final   Culture MULTIPLE SPECIES PRESENT, SUGGEST RECOLLECTION (A)  Final   Report Status 09/10/2016 FINAL  Final     Radiology Studies: Mr 3d Recon At Scanner  Result Date: 09/11/2016 CLINICAL DATA:  Elevated bilirubin and liver enzymes. Known fatty liver. LFTs have risen overnight with increasing abdominal pain. EXAM: MRI ABDOMEN WITHOUT AND WITH CONTRAST (INCLUDING MRCP) TECHNIQUE: Multiplanar multisequence MR imaging of the abdomen was performed both before and after the administration of intravenous contrast. Heavily T2-weighted images of the biliary and pancreatic ducts were obtained, and three-dimensional MRCP images were rendered by post processing. Please note that the patient vomited after IV contrast and refused to complete the exam. Accordingly we are missing are  variety of series. CONTRAST:  73mL MULTIHANCE GADOBENATE DIMEGLUMINE 529 MG/ML IV SOLN COMPARISON:  None. FINDINGS: Lower chest: Indistinct accentuated T2 signal primarily along the interstitium of the lower lobes with suspected airway thickening. This would be better demonstrated by CT. Hepatobiliary: Diffuse hepatic steatosis. Abnormal intrahepatic biliary dilatation is observed along with truncation of a 4.2 cm segment of the distal common hepatic duct and common bile duct as shown on image 98/400. There is a small segment of the distal CBD which is visible but the 4 cm intervening segment demonstrates tapered margins and nonvisualization of the duct in this vicinity. Looking back at the CT from 09/09/2016, there is potentially high-density or enhancement in this segment of the CBD. A well-defined filling defects/stones are not seen. The intrahepatic biliary system is dilated. There is not motion artifact but the CBD is difficult to make out on the single available post-contrast series which appears to be in the arterial phase. If anything there is high density and enhancement in the segment that is presumably  occluded. Based on the subtraction images I believe that there is enhancement in this segment of the extrahepatic biliary tree. Periportal edema noted. Pancreas:  Unremarkable Spleen:  Unremarkable Adrenals/Urinary Tract:  Unremarkable Stomach/Bowel: Unremarkable Vascular/Lymphatic: Accentuated lymph nodes in the porta hepatis including a 1.4 cm portacaval lymph node. Other:  No supplemental non-categorized findings. Musculoskeletal: Degenerative endplate findings in the lumbar spine. IMPRESSION: 1. There is a 4.2 cm segment of the common bile duct and distal common hepatic duct which appears occluded with associated enhancement and intrahepatic biliary dilatation. My impression is that there is enhancement in this vicinity of occlusion and accordingly it is probably not simply due to a blood clot or stones. Ascending cholangitis causing wall thickening and resulting occlusion is raised as a possibility. The stranding and adenopathy along the porta hepatis may all be reactive. 2. Please note the patient experienced vomiting right after contrast and accordingly we only have 1 arterial phase post-contrast series rather than the multiphasic analysis normally performed. The patient refused any further imaging after the vomiting. 3. Diffuse hepatic steatosis. 4. Interstitial accentuation in the lung bases. 5. Periportal edema, nonspecific. Electronically Signed   By: Van Clines M.D.   On: 09/11/2016 19:00   Dg Abd Portable 1v  Result Date: 09/11/2016 CLINICAL DATA:  History of bowel obstruction, right upper quadrant pain with nausea today. EXAM: PORTABLE ABDOMEN - 1 VIEW COMPARISON:  Abdominal and pelvic CT scan of September 09, 2016 FINDINGS: The colonic stool burden is moderate especially in the right colon. There is no small or large bowel obstructive pattern. No free extraluminal gas collections are observed calcifications in the pelvis likely reflect fibroids. Sclerotic foci are noted in both hips and  throughout the bony pelvis and in the lumbar spine. The lung bases are grossly clear. IMPRESSION: Mildly increase colonic stool burden predominantly on the right without evidence of large or small bowel obstruction or perforation. Diffuse metastatic disease involving the lumbar spine, pelvis, and hips. Electronically Signed   By: David  Martinique M.D.   On: 09/11/2016 10:21   Mr Abdomen Mrcp Moise Boring Contast  Result Date: 09/11/2016 CLINICAL DATA:  Elevated bilirubin and liver enzymes. Known fatty liver. LFTs have risen overnight with increasing abdominal pain. EXAM: MRI ABDOMEN WITHOUT AND WITH CONTRAST (INCLUDING MRCP) TECHNIQUE: Multiplanar multisequence MR imaging of the abdomen was performed both before and after the administration of intravenous contrast. Heavily T2-weighted images of the biliary and pancreatic ducts were obtained,  and three-dimensional MRCP images were rendered by post processing. Please note that the patient vomited after IV contrast and refused to complete the exam. Accordingly we are missing are variety of series. CONTRAST:  66mL MULTIHANCE GADOBENATE DIMEGLUMINE 529 MG/ML IV SOLN COMPARISON:  None. FINDINGS: Lower chest: Indistinct accentuated T2 signal primarily along the interstitium of the lower lobes with suspected airway thickening. This would be better demonstrated by CT. Hepatobiliary: Diffuse hepatic steatosis. Abnormal intrahepatic biliary dilatation is observed along with truncation of a 4.2 cm segment of the distal common hepatic duct and common bile duct as shown on image 98/400. There is a small segment of the distal CBD which is visible but the 4 cm intervening segment demonstrates tapered margins and nonvisualization of the duct in this vicinity. Looking back at the CT from 09/09/2016, there is potentially high-density or enhancement in this segment of the CBD. A well-defined filling defects/stones are not seen. The intrahepatic biliary system is dilated. There is not motion  artifact but the CBD is difficult to make out on the single available post-contrast series which appears to be in the arterial phase. If anything there is high density and enhancement in the segment that is presumably occluded. Based on the subtraction images I believe that there is enhancement in this segment of the extrahepatic biliary tree. Periportal edema noted. Pancreas:  Unremarkable Spleen:  Unremarkable Adrenals/Urinary Tract:  Unremarkable Stomach/Bowel: Unremarkable Vascular/Lymphatic: Accentuated lymph nodes in the porta hepatis including a 1.4 cm portacaval lymph node. Other:  No supplemental non-categorized findings. Musculoskeletal: Degenerative endplate findings in the lumbar spine. IMPRESSION: 1. There is a 4.2 cm segment of the common bile duct and distal common hepatic duct which appears occluded with associated enhancement and intrahepatic biliary dilatation. My impression is that there is enhancement in this vicinity of occlusion and accordingly it is probably not simply due to a blood clot or stones. Ascending cholangitis causing wall thickening and resulting occlusion is raised as a possibility. The stranding and adenopathy along the porta hepatis may all be reactive. 2. Please note the patient experienced vomiting right after contrast and accordingly we only have 1 arterial phase post-contrast series rather than the multiphasic analysis normally performed. The patient refused any further imaging after the vomiting. 3. Diffuse hepatic steatosis. 4. Interstitial accentuation in the lung bases. 5. Periportal edema, nonspecific. Electronically Signed   By: Van Clines M.D.   On: 09/11/2016 19:00    Scheduled Meds: . buPROPion  300 mg Oral Daily  . calcium carbonate  500 mg of elemental calcium Oral Daily  . cholecalciferol  1,000 Units Oral Daily  . citalopram  10 mg Oral Daily  . docusate sodium  100 mg Oral BID  . [START ON 09/13/2016] enoxaparin (LOVENOX) injection  40 mg  Subcutaneous Q24H  . fentaNYL  25 mcg Transdermal Q72H  . gabapentin  300 mg Oral TID  . letrozole  2.5 mg Oral Daily  . LORazepam  1 mg Oral Daily  . LORazepam  2 mg Oral QHS  . pantoprazole  40 mg Oral Daily  . zolpidem  5 mg Oral QHS   Continuous Infusions: . sodium chloride 100 mL/hr at 09/12/16 1025     LOS: 3 days   Cambelle Suchecki, Orpah Melter, MD [facility] Pager [phone]  If 7PM-7AM, please contact night-coverage [email or web address] Password [email or web address] 09/12/2016, 2:11 PM

## 2016-09-13 ENCOUNTER — Inpatient Hospital Stay (HOSPITAL_COMMUNITY): Payer: Medicare Other | Admitting: Certified Registered"

## 2016-09-13 ENCOUNTER — Encounter (HOSPITAL_COMMUNITY): Payer: Self-pay

## 2016-09-13 ENCOUNTER — Encounter (HOSPITAL_COMMUNITY): Payer: Medicare Other

## 2016-09-13 ENCOUNTER — Encounter (HOSPITAL_COMMUNITY)
Admission: EM | Disposition: A | Discharge status: Home / Self Care | Payer: Self-pay | Source: Home / Self Care | Attending: Internal Medicine

## 2016-09-13 ENCOUNTER — Inpatient Hospital Stay (HOSPITAL_COMMUNITY): Payer: Medicare Other

## 2016-09-13 ENCOUNTER — Ambulatory Visit (HOSPITAL_COMMUNITY)
Admission: RE | Admit: 2016-09-13 | Discharge: 2016-09-13 | Disposition: A | Discharge status: Home / Self Care | Payer: Medicare Other | Source: Ambulatory Visit | Attending: Hematology and Oncology | Admitting: Hematology and Oncology

## 2016-09-13 DIAGNOSIS — K7689 Other specified diseases of liver: Secondary | ICD-10-CM

## 2016-09-13 DIAGNOSIS — K831 Obstruction of bile duct: Principal | ICD-10-CM

## 2016-09-13 DIAGNOSIS — R918 Other nonspecific abnormal finding of lung field: Secondary | ICD-10-CM | POA: Diagnosis not present

## 2016-09-13 DIAGNOSIS — C50512 Malignant neoplasm of lower-outer quadrant of left female breast: Secondary | ICD-10-CM | POA: Insufficient documentation

## 2016-09-13 DIAGNOSIS — Z9013 Acquired absence of bilateral breasts and nipples: Secondary | ICD-10-CM | POA: Insufficient documentation

## 2016-09-13 DIAGNOSIS — Z170 Estrogen receptor positive status [ER+]: Secondary | ICD-10-CM

## 2016-09-13 DIAGNOSIS — C7949 Secondary malignant neoplasm of other parts of nervous system: Secondary | ICD-10-CM | POA: Insufficient documentation

## 2016-09-13 DIAGNOSIS — R1011 Right upper quadrant pain: Secondary | ICD-10-CM

## 2016-09-13 DIAGNOSIS — K859 Acute pancreatitis without necrosis or infection, unspecified: Secondary | ICD-10-CM | POA: Insufficient documentation

## 2016-09-13 DIAGNOSIS — K760 Fatty (change of) liver, not elsewhere classified: Secondary | ICD-10-CM | POA: Insufficient documentation

## 2016-09-13 HISTORY — PX: ERCP: SHX5425

## 2016-09-13 LAB — COMPREHENSIVE METABOLIC PANEL
ALT: 228 U/L — AB (ref 14–54)
AST: 139 U/L — ABNORMAL HIGH (ref 15–41)
Albumin: 3.5 g/dL (ref 3.5–5.0)
Alkaline Phosphatase: 104 U/L (ref 38–126)
Anion gap: 6 (ref 5–15)
BUN: <5 mg/dL — ABNORMAL LOW (ref 6–20)
CHLORIDE: 106 mmol/L (ref 101–111)
CO2: 27 mmol/L (ref 22–32)
CREATININE: 0.47 mg/dL (ref 0.44–1.00)
Calcium: 8.8 mg/dL — ABNORMAL LOW (ref 8.9–10.3)
Glucose, Bld: 112 mg/dL — ABNORMAL HIGH (ref 65–99)
POTASSIUM: 3.6 mmol/L (ref 3.5–5.1)
Sodium: 139 mmol/L (ref 135–145)
Total Bilirubin: 6.7 mg/dL — ABNORMAL HIGH (ref 0.3–1.2)
Total Protein: 6.3 g/dL — ABNORMAL LOW (ref 6.5–8.1)

## 2016-09-13 LAB — GLUCOSE, CAPILLARY: GLUCOSE-CAPILLARY: 124 mg/dL — AB (ref 65–99)

## 2016-09-13 SURGERY — ERCP, WITH INTERVENTION IF INDICATED
Anesthesia: General

## 2016-09-13 MED ORDER — SODIUM CHLORIDE 0.9 % IV SOLN
INTRAVENOUS | Status: DC | PRN
  Administered 2016-09-13: 30 mL

## 2016-09-13 MED ORDER — LIDOCAINE 2% (20 MG/ML) 5 ML SYRINGE
INTRAMUSCULAR | Status: AC
  Filled 2016-09-13: qty 5

## 2016-09-13 MED ORDER — CIPROFLOXACIN IN D5W 400 MG/200ML IV SOLN
400.0000 mg | Freq: Once | INTRAVENOUS | Status: AC
  Administered 2016-09-13: 400 mg via INTRAVENOUS

## 2016-09-13 MED ORDER — FENTANYL CITRATE (PF) 100 MCG/2ML IJ SOLN
INTRAMUSCULAR | Status: AC
  Filled 2016-09-13: qty 2

## 2016-09-13 MED ORDER — SUCCINYLCHOLINE CHLORIDE 20 MG/ML IJ SOLN
INTRAMUSCULAR | Status: AC
  Filled 2016-09-13: qty 1

## 2016-09-13 MED ORDER — SUCCINYLCHOLINE CHLORIDE 200 MG/10ML IV SOSY
PREFILLED_SYRINGE | INTRAVENOUS | Status: DC | PRN
  Administered 2016-09-13: 100 mg via INTRAVENOUS

## 2016-09-13 MED ORDER — LIDOCAINE 2% (20 MG/ML) 5 ML SYRINGE
INTRAMUSCULAR | Status: DC | PRN
  Administered 2016-09-13: 100 mg via INTRAVENOUS

## 2016-09-13 MED ORDER — FENTANYL CITRATE (PF) 100 MCG/2ML IJ SOLN: 25.0000 ug | INTRAMUSCULAR | Status: DC | PRN

## 2016-09-13 MED ORDER — SODIUM CHLORIDE 0.9 % IV SOLN: INTRAVENOUS | Status: DC

## 2016-09-13 MED ORDER — MORPHINE SULFATE (PF) 2 MG/ML IV SOLN
2.0000 mg | INTRAVENOUS | Status: DC | PRN
  Administered 2016-09-13 – 2016-09-14 (×5): 2 mg via INTRAVENOUS
  Filled 2016-09-13 (×5): qty 1

## 2016-09-13 MED ORDER — MIDAZOLAM HCL 2 MG/2ML IJ SOLN
INTRAMUSCULAR | Status: AC
  Filled 2016-09-13: qty 2

## 2016-09-13 MED ORDER — INDOMETHACIN 50 MG RE SUPP
RECTAL | Status: DC | PRN
  Administered 2016-09-13: 100 mg via RECTAL

## 2016-09-13 MED ORDER — FENTANYL CITRATE (PF) 100 MCG/2ML IJ SOLN
INTRAMUSCULAR | Status: DC | PRN
  Administered 2016-09-13 (×2): 50 ug via INTRAVENOUS

## 2016-09-13 MED ORDER — MIDAZOLAM HCL 5 MG/5ML IJ SOLN
INTRAMUSCULAR | Status: DC | PRN
  Administered 2016-09-13: 2 mg via INTRAVENOUS

## 2016-09-13 MED ORDER — PROMETHAZINE HCL 25 MG/ML IJ SOLN: 6.2500 mg | Freq: Once | INTRAMUSCULAR | Status: DC | PRN

## 2016-09-13 MED ORDER — PROPOFOL 10 MG/ML IV BOLUS
INTRAVENOUS | Status: DC | PRN
  Administered 2016-09-13: 180 mg via INTRAVENOUS

## 2016-09-13 MED ORDER — INDOMETHACIN 50 MG RE SUPP
RECTAL | Status: AC
  Filled 2016-09-13: qty 2

## 2016-09-13 MED ORDER — NYSTATIN 100000 UNIT/ML MT SUSP
5.0000 mL | Freq: Four times a day (QID) | OROMUCOSAL | Status: DC
  Administered 2016-09-13 – 2016-09-22 (×34): 500000 [IU] via ORAL
  Filled 2016-09-13 (×33): qty 5

## 2016-09-13 MED ORDER — CIPROFLOXACIN IN D5W 400 MG/200ML IV SOLN
INTRAVENOUS | Status: AC
  Filled 2016-09-13: qty 200

## 2016-09-13 MED ORDER — PROPOFOL 10 MG/ML IV BOLUS
INTRAVENOUS | Status: AC
  Filled 2016-09-13: qty 20

## 2016-09-13 MED ORDER — GLUCAGON HCL RDNA (DIAGNOSTIC) 1 MG IJ SOLR
INTRAMUSCULAR | Status: AC
Start: 2016-09-13 — End: 2016-09-13
  Filled 2016-09-13: qty 1

## 2016-09-13 MED ORDER — IOPAMIDOL (ISOVUE-300) INJECTION 61%
75.0000 mL | Freq: Once | INTRAVENOUS | Status: AC | PRN
  Administered 2016-09-13: 75 mL via INTRAVENOUS

## 2016-09-13 NOTE — Transfer of Care (Signed)
Immediate Anesthesia Transfer of Care Note  Patient: Tina Vaughan  Procedure(s) Performed: Procedure(s): ENDOSCOPIC RETROGRADE CHOLANGIOPANCREATOGRAPHY (ERCP) (N/A)  Patient Location: PACU  Anesthesia Type:General  Level of Consciousness: awake, alert  and oriented  Airway & Oxygen Therapy: Patient Spontanous Breathing and Patient connected to face mask oxygen  Post-op Assessment: Report given to RN and Post -op Vital signs reviewed and stable  Post vital signs: Reviewed and stable  Last Vitals:  Vitals:   09/13/16 0614 09/13/16 1102  BP: (!) 134/91 (!) 154/97  Pulse: 91 89  Resp: 16 12  Temp: 36.9 C 36.8 C    Last Pain:  Vitals:   09/13/16 1102  TempSrc: Oral  PainSc: 2       Patients Stated Pain Goal: 0 ([id number])  Complications: No apparent anesthesia complications

## 2016-09-13 NOTE — Anesthesia Postprocedure Evaluation (Signed)
Anesthesia Post Note  Patient: NAZLY DIJULIO  Procedure(s) Performed: Procedure(s) (LRB): ENDOSCOPIC RETROGRADE CHOLANGIOPANCREATOGRAPHY (ERCP) (N/A)  Patient location during evaluation: PACU Anesthesia Type: General Level of consciousness: awake and alert Pain management: pain level controlled Vital Signs Assessment: post-procedure vital signs reviewed and stable Respiratory status: spontaneous breathing, nonlabored ventilation, respiratory function stable and patient connected to nasal cannula oxygen Cardiovascular status: blood pressure returned to baseline and stable Postop Assessment: no signs of nausea or vomiting Anesthetic complications: no    Last Vitals:  Vitals:   09/13/16 1251 09/13/16 1300  BP: (!) 159/102 (!) 152/92  Pulse: 83 83  Resp: 19 17  Temp: 36.4 C     Last Pain:  Vitals:   09/13/16 1300  TempSrc:   PainSc: 6                  Hector Venne S

## 2016-09-13 NOTE — Anesthesia Procedure Notes (Signed)
Procedure Name: Intubation Date/Time: 09/13/2016 12:50 PM Performed by: Noralyn Pick D Pre-anesthesia Checklist: Patient identified, Emergency Drugs available, Suction available and Patient being monitored Patient Re-evaluated:Patient Re-evaluated prior to inductionOxygen Delivery Method: Circle system utilized Preoxygenation: Pre-oxygenation with 100% oxygen Intubation Type: IV induction Ventilation: Mask ventilation without difficulty Laryngoscope Size: Mac and 3 Grade View: Grade III Tube type: Oral Tube size: 7.5 mm Number of attempts: 1 Airway Equipment and Method: Stylet Placement Confirmation: ETT inserted through vocal cords under direct vision,  positive ETCO2 and breath sounds checked- equal and bilateral Secured at: 21 cm Tube secured with: Tape Dental Injury: Teeth and Oropharynx as per pre-operative assessment

## 2016-09-13 NOTE — Progress Notes (Signed)
Back from endoscopy and Ct scan, awake , alert and oriented. Complaining of a lot of abdominal pain, medicated. V/S taken, continue to monitor

## 2016-09-13 NOTE — Progress Notes (Signed)
PROGRESS NOTE    Tina Vaughan  [id number] DOB: 1961-04-26 DOA: 09/08/2016  PCP: Mackie Pai, PA-C   Brief Narrative:  55 y.o.femalewith medical history significant of metastatic breast cancer (s/p of surgery and currently on Ibrance and letrozole), GERD, depression, anxiety, OSA on CPAP, edema, who presents with abdominal pain.  Pt states that she has been having abdominal pain in the past 5 days, which is new. Her abdominal pain is located in the right upper quadrant, constant, 5 out of 10 in severity, radiating to the right back. She also hasnausea, but no vomiting. She has been having diarrhea in the past 4 days. She states that she was on stool softener, but she continues to have diarrhea after she stopped stool softer 3 days ago. She has 5-7 bowel movementswith loose stool each day. No fever or chills. Patient denies symptoms of UTI. She does not have chest pain, shortness of breath, cough, unilateral weakness   Subjective: Pain in right and mid chest and upper abdomen. Mild nausea.   Assessment & Plan:  Abdominal pain and Abnormal liver function: -Presenting ALP 101, AST 110, ALT 311 and total bilirubin 5.1. -CT abd with fatty liver and periportal adenopathy  - Abd [name] with fatty liver   - MRCP demonstrated suspicion for CBD obstruction - LFT's remain elevated with rising bilirubin -  ERCP shows biliary stricture suspected to be from external compression (from lymph nodes?) which has been stented  Breast cancer of lower-outer quadrant of left female breast [facility]):  -Patient is s/p mastectomy -Seen by Dr. Lindi Adie -Patient is continued on Ibrance and letrozole  Depression and anxiety: -Mood seems stable at present -Will continue Wellbutrin, Celexa and Ativan as tolerated  Recent Diarrhea/ grey stools -No further diarrhea noted -Now with decreased stool output  Positive urinalysis:  -Initial UA was suggestive of possible UTI -Patient is now s/p fosfomycin    -Urine culture with multiple species present -Afebrile    DVT prophylaxis: Lovenox Code Status: DNR Family Communication:  Disposition Plan: home when stable Consultants:   GI Procedures:   ERCP 11/1 Antimicrobials:  Anti-infectives    Start     Dose/Rate Route Frequency Ordered Stop   09/13/16 1145  ciprofloxacin (CIPRO) IVPB 400 mg     400 mg 200 mL/hr over 60 Minutes Intravenous  Once 09/13/16 1137 09/13/16 1154       Objective: Vitals:   09/13/16 1310 09/13/16 1315 09/13/16 1320 09/13/16 1330  BP: (!) 168/99  (!) 171/103   Pulse: 79  74 75  Resp: 16  19 13   Temp:      TempSrc:      SpO2: 99% 96% 91% 96%  Weight:      Height:        Intake/Output Summary (Last 24 hours) at 09/13/16 1441 Last data filed at 09/13/16 1254  Gross per 24 hour  Intake          1713.33 ml  Output                0 ml  Net          1713.33 ml   Filed Weights   09/08/16 1843 09/09/16 0127  Weight: 98.9 kg (218 lb) 96.7 kg (213 lb 3 oz)    Examination: General exam: Appears comfortable  HEENT: PERRLA, oral mucosa moist, no sclera icterus or thrush Respiratory system: Clear to auscultation. Respiratory effort normal. Cardiovascular system: S1 & S2 heard, RRR.  No murmurs  Gastrointestinal system:  Abdomen soft,  Tender in RUQ and epigastrium, nondistended. Normal bowel sound. No organomegaly Central nervous system: Alert and oriented. No focal neurological deficits. Extremities: No cyanosis, clubbing or edema Skin: No rashes or ulcers Psychiatry:  Mood & affect appropriate.     Data Reviewed: I have personally reviewed following labs and imaging studies  CBC:  Recent Labs Lab 09/07/16 0951 09/08/16 2147 09/09/16 0130 09/10/16 0415 09/10/16 0840 09/12/16 0540  WBC 2.2* 2.9* 2.7* 2.1* 2.0* 2.2*  NEUTROABS 1.4* 1.8  --   --   --   --   HGB 12.3 12.4 11.6* 11.6* 11.4* 11.8*  HCT 34.9 35.0* 32.9* 33.8* 32.8* 33.5*  MCV 92.6 91.1 93.5 94.2 92.1 93.6  PLT 139* 153  134* 146* 140* [id number]*   Basic Metabolic Panel:  Recent Labs Lab 09/09/16 0130 09/10/16 0840 09/11/16 0440 09/12/16 0540 09/13/16 0600  NA 138 139 139 138 139  K 2.9* 3.7 3.4* 3.5 3.6  CL 110 110 108 104 106  CO2 23 24 25 26 27   GLUCOSE 98 115* 124* 122* 112*  BUN <5* <5* <5* <5* <5*  CREATININE 0.57 0.47 0.57 0.44 0.47  CALCIUM 7.9* 8.4* 9.1 8.9 8.8*   GFR: Estimated Creatinine Clearance: 87.2 mL/min (by C-G formula based on SCr of 0.47 mg/dL). Liver Function Tests:  Recent Labs Lab 09/09/16 0130 09/10/16 0840 09/11/16 0440 09/12/16 0540 09/13/16 0600  AST 104* 108* 120* 131* 139*  ALT 271* 238* 239* 230* 228*  ALKPHOS 93 92 102 102 104  BILITOT 5.1* 5.2* 5.6* 6.3* 6.7*  PROT 6.4* 6.5 6.7 6.7 6.3*  ALBUMIN 3.5 3.6 3.7 3.6 3.5    Recent Labs Lab 09/08/16 2147  LIPASE 26    Recent Labs Lab 09/08/16 2147  AMMONIA 46*   Coagulation Profile:  Recent Labs Lab 09/08/16 2147  INR 0.97   Cardiac Enzymes: No results for input(s): CKTOTAL, CKMB, CKMBINDEX, TROPONINI in the last 168 hours. BNP (last 3 results) No results for input(s): PROBNP in the last 8760 hours. HbA1C: No results for input(s): HGBA1C in the last 72 hours. CBG:  Recent Labs Lab 09/09/16 0800 09/10/16 0804 09/11/16 0751 09/12/16 0743 09/13/16 0755  GLUCAP 93 104* 151* 111* 124*   Lipid Profile: No results for input(s): CHOL, HDL, LDLCALC, TRIG, CHOLHDL, LDLDIRECT in the last 72 hours. Thyroid Function Tests: No results for input(s): TSH, T4TOTAL, FREET4, T3FREE, THYROIDAB in the last 72 hours. Anemia Panel:  Recent Labs  09/11/16 0440  TIBC 301  IRON 92   Urine analysis:    Component Value Date/Time   COLORURINE ORANGE (A) 09/08/2016 2000   APPEARANCEUR CLEAR 09/08/2016 2000   LABSPEC 1.019 09/08/2016 2000   LABSPEC 1.015 01/18/2011 1144   PHURINE 6.5 09/08/2016 2000   GLUCOSEU NEGATIVE 09/08/2016 2000   HGBUR TRACE (A) 09/08/2016 2000   BILIRUBINUR LARGE (A)  09/08/2016 2000   BILIRUBINUR 1 06/04/2015 1122   BILIRUBINUR Negative 01/18/2011 [place] 09/08/2016 2000   PROTEINUR NEGATIVE 09/08/2016 2000   UROBILINOGEN 0.2 06/04/2015 2235   NITRITE POSITIVE (A) 09/08/2016 2000   LEUKOCYTESUR SMALL (A) 09/08/2016 2000   LEUKOCYTESUR Trace 01/18/2011 1144   Sepsis Labs: @LABRCNTIP (procalcitonin:4,lacticidven:4) ) Recent Results (from the past 240 hour(s))  Urine culture     Status: Abnormal   Collection Time: 09/08/16 11:51 PM  Result Value Ref Range Status   Specimen Description URINE, CLEAN CATCH  Final   Special Requests NONE  Final   Culture MULTIPLE SPECIES PRESENT,  SUGGEST RECOLLECTION (A)  Final   Report Status 09/10/2016 FINAL  Final         Radiology Studies: Mr 3d Recon At Scanner  Result Date: 09/11/2016 CLINICAL DATA:  Elevated bilirubin and liver enzymes. Known fatty liver. LFTs have risen overnight with increasing abdominal pain. EXAM: MRI ABDOMEN WITHOUT AND WITH CONTRAST (INCLUDING MRCP) TECHNIQUE: Multiplanar multisequence MR imaging of the abdomen was performed both before and after the administration of intravenous contrast. Heavily T2-weighted images of the biliary and pancreatic ducts were obtained, and three-dimensional MRCP images were rendered by post processing. Please note that the patient vomited after IV contrast and refused to complete the exam. Accordingly we are missing are variety of series. CONTRAST:  48mL MULTIHANCE GADOBENATE DIMEGLUMINE 529 MG/ML IV SOLN COMPARISON:  None. FINDINGS: Lower chest: Indistinct accentuated T2 signal primarily along the interstitium of the lower lobes with suspected airway thickening. This would be better demonstrated by CT. Hepatobiliary: Diffuse hepatic steatosis. Abnormal intrahepatic biliary dilatation is observed along with truncation of a 4.2 cm segment of the distal common hepatic duct and common bile duct as shown on image 98/400. There is a small segment of  the distal CBD which is visible but the 4 cm intervening segment demonstrates tapered margins and nonvisualization of the duct in this vicinity. Looking back at the CT from 09/09/2016, there is potentially high-density or enhancement in this segment of the CBD. A well-defined filling defects/stones are not seen. The intrahepatic biliary system is dilated. There is not motion artifact but the CBD is difficult to make out on the single available post-contrast series which appears to be in the arterial phase. If anything there is high density and enhancement in the segment that is presumably occluded. Based on the subtraction images I believe that there is enhancement in this segment of the extrahepatic biliary tree. Periportal edema noted. Pancreas:  Unremarkable Spleen:  Unremarkable Adrenals/Urinary Tract:  Unremarkable Stomach/Bowel: Unremarkable Vascular/Lymphatic: Accentuated lymph nodes in the porta hepatis including a 1.4 cm portacaval lymph node. Other:  No supplemental non-categorized findings. Musculoskeletal: Degenerative endplate findings in the lumbar spine. IMPRESSION: 1. There is a 4.2 cm segment of the common bile duct and distal common hepatic duct which appears occluded with associated enhancement and intrahepatic biliary dilatation. My impression is that there is enhancement in this vicinity of occlusion and accordingly it is probably not simply due to a blood clot or stones. Ascending cholangitis causing wall thickening and resulting occlusion is raised as a possibility. The stranding and adenopathy along the porta hepatis may all be reactive. 2. Please note the patient experienced vomiting right after contrast and accordingly we only have 1 arterial phase post-contrast series rather than the multiphasic analysis normally performed. The patient refused any further imaging after the vomiting. 3. Diffuse hepatic steatosis. 4. Interstitial accentuation in the lung bases. 5. Periportal edema,  nonspecific. Electronically Signed   By: Van Clines M.D.   On: 09/11/2016 19:00   Mr Abdomen Mrcp Moise Boring Contast  Result Date: 09/11/2016 CLINICAL DATA:  Elevated bilirubin and liver enzymes. Known fatty liver. LFTs have risen overnight with increasing abdominal pain. EXAM: MRI ABDOMEN WITHOUT AND WITH CONTRAST (INCLUDING MRCP) TECHNIQUE: Multiplanar multisequence MR imaging of the abdomen was performed both before and after the administration of intravenous contrast. Heavily T2-weighted images of the biliary and pancreatic ducts were obtained, and three-dimensional MRCP images were rendered by post processing. Please note that the patient vomited after IV contrast and refused to complete the exam.  Accordingly we are missing are variety of series. CONTRAST:  52mL MULTIHANCE GADOBENATE DIMEGLUMINE 529 MG/ML IV SOLN COMPARISON:  None. FINDINGS: Lower chest: Indistinct accentuated T2 signal primarily along the interstitium of the lower lobes with suspected airway thickening. This would be better demonstrated by CT. Hepatobiliary: Diffuse hepatic steatosis. Abnormal intrahepatic biliary dilatation is observed along with truncation of a 4.2 cm segment of the distal common hepatic duct and common bile duct as shown on image 98/400. There is a small segment of the distal CBD which is visible but the 4 cm intervening segment demonstrates tapered margins and nonvisualization of the duct in this vicinity. Looking back at the CT from 09/09/2016, there is potentially high-density or enhancement in this segment of the CBD. A well-defined filling defects/stones are not seen. The intrahepatic biliary system is dilated. There is not motion artifact but the CBD is difficult to make out on the single available post-contrast series which appears to be in the arterial phase. If anything there is high density and enhancement in the segment that is presumably occluded. Based on the subtraction images I believe that there is  enhancement in this segment of the extrahepatic biliary tree. Periportal edema noted. Pancreas:  Unremarkable Spleen:  Unremarkable Adrenals/Urinary Tract:  Unremarkable Stomach/Bowel: Unremarkable Vascular/Lymphatic: Accentuated lymph nodes in the porta hepatis including a 1.4 cm portacaval lymph node. Other:  No supplemental non-categorized findings. Musculoskeletal: Degenerative endplate findings in the lumbar spine. IMPRESSION: 1. There is a 4.2 cm segment of the common bile duct and distal common hepatic duct which appears occluded with associated enhancement and intrahepatic biliary dilatation. My impression is that there is enhancement in this vicinity of occlusion and accordingly it is probably not simply due to a blood clot or stones. Ascending cholangitis causing wall thickening and resulting occlusion is raised as a possibility. The stranding and adenopathy along the porta hepatis may all be reactive. 2. Please note the patient experienced vomiting right after contrast and accordingly we only have 1 arterial phase post-contrast series rather than the multiphasic analysis normally performed. The patient refused any further imaging after the vomiting. 3. Diffuse hepatic steatosis. 4. Interstitial accentuation in the lung bases. 5. Periportal edema, nonspecific. Electronically Signed   By: Van Clines M.D.   On: 09/11/2016 19:00      Scheduled Meds: . buPROPion  300 mg Oral Daily  . calcium carbonate  500 mg of elemental calcium Oral Daily  . cholecalciferol  1,000 Units Oral Daily  . citalopram  10 mg Oral Daily  . docusate sodium  100 mg Oral BID  . enoxaparin (LOVENOX) injection  40 mg Subcutaneous Q24H  . fentaNYL  25 mcg Transdermal Q72H  . gabapentin  300 mg Oral TID  . letrozole  2.5 mg Oral Daily  . LORazepam  1 mg Oral Daily  . LORazepam  2 mg Oral QHS  . nystatin  5 mL Oral QID  . pantoprazole  40 mg Oral Daily  . zolpidem  5 mg Oral QHS   Continuous Infusions: . sodium  chloride 100 mL/hr at 09/13/16 0627     LOS: 4 days    Time spent in minutes: 86 min    Sherlene Rickel, MD [facility] Pager: [email or web address] Password [email or web address] 09/13/2016, 2:41 PM

## 2016-09-13 NOTE — Evaluation (Signed)
SLP Cancellation Note  Patient Details Name: Tina Vaughan MRN: [id number] DOB: [birth year]   Cancelled treatment:       Reason Eval/Treat Not Completed: Medical issues which prohibited therapy (pt at endoscopy at this time, will continue efforts)  Luanna Salk, [facility] SLP [phone]

## 2016-09-13 NOTE — Anesthesia Preprocedure Evaluation (Signed)
Anesthesia Evaluation  Patient identified by MRN, date of birth, ID band Patient awake  General Assessment Comment:Breast Ca with mets  Reviewed: Allergy & Precautions, NPO status , Patient's Chart, lab work & pertinent test results  Airway Mallampati: II  TM Distance: >3 FB Neck ROM: Full    Dental no notable dental hx.    Pulmonary sleep apnea ,    Pulmonary exam normal breath sounds clear to auscultation       Cardiovascular negative cardio ROS Normal cardiovascular exam Rhythm:Regular Rate:Normal     Neuro/Psych negative neurological ROS  negative psych ROS   GI/Hepatic negative GI ROS, Neg liver ROS,   Endo/Other  negative endocrine ROS  Renal/GU negative Renal ROS  negative genitourinary   Musculoskeletal negative musculoskeletal ROS (+)   Abdominal   Peds negative pediatric ROS (+)  Hematology  (+) anemia ,   Anesthesia Other Findings   Reproductive/Obstetrics negative OB ROS                             Anesthesia Physical Anesthesia Plan  ASA: III  Anesthesia Plan: General   Post-op Pain Management:    Induction: Intravenous  Airway Management Planned: Oral ETT  Additional Equipment:   Intra-op Plan:   Post-operative Plan: Extubation in OR  Informed Consent: I have reviewed the patients History and Physical, chart, labs and discussed the procedure including the risks, benefits and alternatives for the proposed anesthesia with the patient or authorized representative who has indicated his/her understanding and acceptance.   Dental advisory given  Plan Discussed with: CRNA and Surgeon  Anesthesia Plan Comments:         Anesthesia Quick Evaluation

## 2016-09-13 NOTE — Care Management Important Message (Signed)
Important Message  Patient Details  Name: Tina Vaughan MRN: [id number] Date of Birth: 01-Oct-1961   Medicare Important Message Given:  Yes    Camillo Flaming 09/13/2016, 10:03 AMImportant Message  Patient Details  Name: Tina Vaughan MRN: [id number] Date of Birth: 21-Aug-1961   Medicare Important Message Given:  Yes    Camillo Flaming 09/13/2016, 10:03 AM

## 2016-09-13 NOTE — Progress Notes (Signed)
Tina Vaughan 11:38 AM  Subjective: Patient without any new complaints and her procedure rediscussed with her sister as well  Objective: Vital signs stable afebrile exam please see preassessment evaluation labs stable  Assessment: Obstructive jaundice  Plan: Okay to proceed with ERCP with anesthesia assistance  [facility]  Pager [phone] After 5PM or if no answer call [phone]

## 2016-09-13 NOTE — Op Note (Signed)
[facility] Patient Name: Tina Vaughan Procedure Date: 09/13/2016 MRN: [id number] Attending MD: Clarene Essex , MD Date of Birth: 05/24/1961 CSN: [id number] Age: 55 Admit Type: Inpatient Procedure:                ERCP Indications:              Abnormal MRCP elevated liver tests abdominal pain Providers:                Clarene Essex, MD, Hilma Favors, RN, Carolynn Comment, RN, [facility], Technician, [facility], CRNA Referring MD:              Medicines:                General Anesthesia Complications:            No immediate complications. Estimated Blood Loss:     Estimated blood loss: none. Procedure:                Pre-Anesthesia Assessment:                           - Prior to the procedure, a History and Physical                            was performed, and patient medications and                            allergies were reviewed. The patient's tolerance of                            previous anesthesia was also reviewed. The risks                            and benefits of the procedure and the sedation                            options and risks were discussed with the patient.                            All questions were answered, and informed consent                            was obtained. Prior Anticoagulants: The patient has                            taken no previous anticoagulant or antiplatelet                            agents. ASA Grade Assessment: II - A patient with  mild systemic disease. After reviewing the risks                            and benefits, the patient was deemed in                            satisfactory condition to undergo the procedure.                           After obtaining informed consent, the scope was                            passed under direct vision. Throughout the                            procedure, the patient's blood  pressure, pulse, and                            oxygen saturations were monitored continuously. The                            [id number]) scope was introduced through                            the mouth, and used to inject contrast into and                            used to locate the major papilla. The ERCP was                            accomplished without difficulty. The patient                            tolerated the procedure well. Scope In: Scope Out: Findings:      The major papilla was normal.possibly on initial cannulation the wire       went towards the pancreas 1 time but after repositioning deep selective       cannulation was obtained and an obvious distal stricture was seen and we       proceeded with the Biliary sphincterotomy which was made with a       Hydratome sphincterotome using ERBE electrocautery. in the customary       fashion and a medium size sphincterotomy was made There was no       post-sphincterotomy bleeding. The lower third of the main bile duct and       middle third of the main bile duct contained a single localized stenosis       5 mm in length. Cells for cytology were obtained by brushing. One 10 Fr       by 6 cm covered metal stent was placed into the common bile duct.       unfortunately the introducer grabbed the stent and pulled it distally a       little bit with removal of the introducer and Minimal contrast flowed       through the stent. The stent was placed downstream (  towards the lumen of       the GI tract). under fluoroscopy however the stent still seem to be in       adequate position although not perfect like it was prior to the removal       of the introducer The upper GI tract was traversed under direct vision       without detailed examination. A medium amount of food (residue) was       found in the gastric body. Impression:               - The major papilla appeared normal. possibly 1                            wire  advancement towards the pancreas but no                            pancreatic duct injection                           - A medium amount of food (residue) in the stomach.                           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 The                            stricture was indeterminate but probably from                            increased retroperitoneal lymph nodes from her                            cancer. This stricture was treated with biliary                            sphincterotomy.                           - A sphincterotomy was performed.                           - One covered metal stent was placed into the                            common bile duct. Moderate Sedation:      N/A- Per Anesthesia Care Recommendation:           - Clear liquid diet today.                           - Continue present medications.                           - Check liver enzymes (AST, ALT, alkaline                            phosphatase, bilirubin) in the morning.                           -  Return to GI clinic in 2 weeks.                           - Telephone GI clinic if symptomatic PRN. possibly                            we may need to remove this stent and replace                            another if liver tests do not slowly decrease in                            symptoms slowly improve but we will observe for now                            and worry about gastroparesis as well Procedure Code(s):        --- Professional ---                           [phone], Esophagogastroduodenoscopy, flexible,                            transoral; diagnostic, including collection of                            specimen(s) by brushing or washing, when performed                            (separate procedure) Diagnosis Code(s):        --- Professional ---                           K83.1, Obstruction of bile duct                           R93.2, Abnormal findings on diagnostic imaging of                             liver and biliary tract CPT copyright 2016 [employer]. All rights reserved. The codes documented in this report are preliminary and upon coder review may  be revised to meet current compliance requirements. Clarene Essex, MD 09/13/2016 12:50:25 PM This report has been signed electronically. Number of Addenda: 0

## 2016-09-14 ENCOUNTER — Encounter (HOSPITAL_COMMUNITY): Payer: Self-pay | Admitting: Gastroenterology

## 2016-09-14 DIAGNOSIS — Z515 Encounter for palliative care: Secondary | ICD-10-CM

## 2016-09-14 DIAGNOSIS — R1011 Right upper quadrant pain: Secondary | ICD-10-CM

## 2016-09-14 DIAGNOSIS — K858 Other acute pancreatitis without necrosis or infection: Secondary | ICD-10-CM

## 2016-09-14 LAB — COMPREHENSIVE METABOLIC PANEL
ALBUMIN: 3.8 g/dL (ref 3.5–5.0)
ALK PHOS: 125 U/L (ref 38–126)
ALT: 228 U/L — ABNORMAL HIGH (ref 14–54)
AST: 125 U/L — AB (ref 15–41)
Anion gap: 8 (ref 5–15)
BILIRUBIN TOTAL: 7.6 mg/dL — AB (ref 0.3–1.2)
BUN: 7 mg/dL (ref 6–20)
CALCIUM: 8.5 mg/dL — AB (ref 8.9–10.3)
CO2: 25 mmol/L (ref 22–32)
Chloride: 104 mmol/L (ref 101–111)
Creatinine, Ser: 0.37 mg/dL — ABNORMAL LOW (ref 0.44–1.00)
GFR calc Af Amer: >60 mL/min (ref >60)
GLUCOSE: 121 mg/dL — AB (ref 65–99)
Potassium: 3.7 mmol/L (ref 3.5–5.1)
Sodium: 137 mmol/L (ref 135–145)
TOTAL PROTEIN: 7 g/dL (ref 6.5–8.1)

## 2016-09-14 LAB — GLUCOSE, CAPILLARY: GLUCOSE-CAPILLARY: 104 mg/dL — AB (ref 65–99)

## 2016-09-14 LAB — CBC WITH DIFFERENTIAL/PLATELET
BASOS ABS: 0 10*3/uL (ref 0.0–0.1)
BASOS PCT: 0 %
Eosinophils Absolute: 0 10*3/uL (ref 0.0–0.7)
Eosinophils Relative: 0 %
HEMATOCRIT: 37.7 % (ref 36.0–46.0)
HEMOGLOBIN: 13 g/dL (ref 12.0–15.0)
LYMPHS PCT: 9 %
Lymphs Abs: 0.4 10*3/uL — ABNORMAL LOW (ref 0.7–4.0)
MCH: 32.4 pg (ref 26.0–34.0)
MCHC: 34.5 g/dL (ref 30.0–36.0)
MCV: 94 fL (ref 78.0–100.0)
MONO ABS: 0.5 10*3/uL (ref 0.1–1.0)
MONOS PCT: 10 %
NEUTROS ABS: 3.7 10*3/uL (ref 1.7–7.7)
NEUTROS PCT: 81 %
Platelets: 153 10*3/uL (ref 150–400)
RBC: 4.01 MIL/uL (ref 3.87–5.11)
RDW: 14.9 % (ref 11.5–15.5)
WBC: 4.6 10*3/uL (ref 4.0–10.5)

## 2016-09-14 LAB — LIPASE, BLOOD: Lipase: 3006 U/L — ABNORMAL HIGH (ref 11–51)

## 2016-09-14 MED ORDER — HYDROMORPHONE HCL 1 MG/ML IJ SOLN
1.0000 mg | INTRAMUSCULAR | Status: DC | PRN
  Administered 2016-09-14 (×2): 1 mg via INTRAVENOUS
  Filled 2016-09-14 (×2): qty 1

## 2016-09-14 MED ORDER — HYDROMORPHONE HCL 1 MG/ML IJ SOLN
1.0000 mg | INTRAMUSCULAR | Status: DC | PRN
  Administered 2016-09-14 – 2016-09-18 (×32): 1 mg via INTRAVENOUS
  Filled 2016-09-14 (×32): qty 1

## 2016-09-14 MED ORDER — POLYETHYLENE GLYCOL 3350 17 G PO PACK
17.0000 g | PACK | Freq: Two times a day (BID) | ORAL | Status: DC
  Administered 2016-09-14 – 2016-09-20 (×12): 17 g via ORAL
  Filled 2016-09-14 (×13): qty 1

## 2016-09-14 MED ORDER — OXYCODONE HCL 5 MG PO TABS: 15.0000 mg | ORAL_TABLET | ORAL | Status: DC

## 2016-09-14 MED ORDER — FENTANYL 50 MCG/HR TD PT72
50.0000 ug | MEDICATED_PATCH | TRANSDERMAL | Status: DC
  Administered 2016-09-14: 50 ug via TRANSDERMAL
  Filled 2016-09-14: qty 1

## 2016-09-14 MED ORDER — SENNA 8.6 MG PO TABS
2.0000 | ORAL_TABLET | Freq: Every day | ORAL | Status: DC
  Administered 2016-09-14 – 2016-09-21 (×7): 17.2 mg via ORAL
  Filled 2016-09-14 (×8): qty 2

## 2016-09-14 MED ORDER — ONDANSETRON HCL 4 MG/2ML IJ SOLN
4.0000 mg | Freq: Four times a day (QID) | INTRAMUSCULAR | Status: DC
  Administered 2016-09-14 – 2016-09-22 (×30): 4 mg via INTRAVENOUS
  Filled 2016-09-14 (×32): qty 2

## 2016-09-14 MED ORDER — MORPHINE SULFATE (PF) 4 MG/ML IV SOLN
4.0000 mg | Freq: Once | INTRAVENOUS | Status: AC
  Administered 2016-09-14: 4 mg via INTRAVENOUS
  Filled 2016-09-14: qty 1

## 2016-09-14 MED ORDER — OXYCODONE HCL 5 MG PO TABS
25.0000 mg | ORAL_TABLET | Freq: Four times a day (QID) | ORAL | Status: DC
  Administered 2016-09-14: 25 mg via ORAL
  Filled 2016-09-14: qty 5

## 2016-09-14 NOTE — Progress Notes (Signed)
PROGRESS NOTE    Tina Vaughan  [id number] DOB: 1961-08-06 DOA: 09/08/2016  PCP: Mackie Pai, PA-C   Brief Narrative:  55 y.o.femalewith medical history significant of metastatic breast cancer (s/p of surgery and currently on Ibrance and letrozole), GERD, depression, anxiety, OSA on CPAP, edema, who presents with abdominal pain.  Pt states that she has been having abdominal pain in the past 5 days, which is new. Her abdominal pain is located in the right upper quadrant, constant, 5 out of 10 in severity, radiating to the right back. She also hasnausea, but no vomiting. She has been having diarrhea in the past 4 days. She states that she was on stool softener, but she continues to have diarrhea after she stopped stool softer 3 days ago. She has 5-7 bowel movementswith loose stool each day. No fever or chills. Patient denies symptoms of UTI. She does not have chest pain, shortness of breath, cough, unilateral weakness   Subjective: Continues to have pain in right and mid chest and upper abdomen radiates to her back. 15mg  Oxycodone do not help at all. Asking for increase in pain medications.   Assessment & Plan:  Abdominal pain and Abnormal liver function: -Presenting ALP 101, AST 110, ALT 311 and total bilirubin 5.1. -CT abd with fatty liver and periportal adenopathy  - Abd [name] with fatty liver   - MRCP demonstrated suspicion for CBD obstruction -  ERCP shows biliary stricture suspected to be from external compression (from lymph nodes) which has been stented however, LFT's remain elevated with rising bilirubin  Acute pancreatitis - due to ERCP- cont IVF, clear liquids for now- d/w GI  Breast cancer of lower-outer quadrant of left female breast [facility]):  -Patient is s/p mastectomy -Seen by Dr. Lindi Adie -Patient is continued on Ibrance and letrozole  Depression and anxiety: -Mood seems stable at present -Will continue Wellbutrin, Celexa and Ativan as tolerated  Recent  Diarrhea/ grey stools -No further diarrhea noted -Now with decreased stool output  Positive urinalysis:  -Initial UA was suggestive of possible UTI -Patient is now s/p fosfomycin  -Urine culture with multiple species present -Afebrile  Fatty liver - noted on imaging    DVT prophylaxis: Lovenox Code Status: DNR Family Communication:  Disposition Plan: home when stable Consultants:   GI Procedures:   ERCP 11/1 Antimicrobials:  Anti-infectives    Start     Dose/Rate Route Frequency Ordered Stop   09/13/16 1145  ciprofloxacin (CIPRO) IVPB 400 mg     400 mg 200 mL/hr over 60 Minutes Intravenous  Once 09/13/16 1137 09/13/16 1154       Objective: Vitals:   09/13/16 1330 09/13/16 1430 09/13/16 2150 09/14/16 0535  BP:  (!) 156/104 124/89 123/80  Pulse: 75 80 82 92  Resp: 13 20 16    Temp:  98.2 F (36.8 C) 98.1 F (36.7 C) 98.9 F (37.2 C)  TempSrc:  Oral Oral Oral  SpO2: 96% 96% 98% 96%  Weight:      Height:        Intake/Output Summary (Last 24 hours) at 09/14/16 1250 Last data filed at 09/14/16 0600  Gross per 24 hour  Intake             2060 ml  Output                0 ml  Net             2060 ml   Filed Weights   09/08/16 1843 09/09/16  0127  Weight: 98.9 kg (218 lb) 96.7 kg (213 lb 3 oz)    Examination: General exam: Appears comfortable  HEENT: PERRLA, oral mucosa moist, no sclera icterus or thrush Respiratory system: Clear to auscultation. Respiratory effort normal. Cardiovascular system: S1 & S2 heard, RRR.  No murmurs  Gastrointestinal system: Abdomen soft,  Tender in RUQ and epigastrium, nondistended. Normal bowel sound. No organomegaly Central nervous system: Alert and oriented. No focal neurological deficits. Extremities: No cyanosis, clubbing or edema Skin: No rashes or ulcers Psychiatry:  Mood & affect appropriate.     Data Reviewed: I have personally reviewed following labs and imaging studies  CBC:  Recent Labs Lab 09/08/16 2147  09/09/16 0130 09/10/16 0415 09/10/16 0840 09/12/16 0540 09/14/16 0503  WBC 2.9* 2.7* 2.1* 2.0* 2.2* 4.6  NEUTROABS 1.8  --   --   --   --  3.7  HGB 12.4 11.6* 11.6* 11.4* 11.8* 13.0  HCT 35.0* 32.9* 33.8* 32.8* 33.5* 37.7  MCV 91.1 93.5 94.2 92.1 93.6 94.0  PLT 153 134* 146* 140* 135* 0000000   Basic Metabolic Panel:  Recent Labs Lab 09/10/16 0840 09/11/16 0440 09/12/16 0540 09/13/16 0600 09/14/16 0503  NA 139 139 138 139 137  K 3.7 3.4* 3.5 3.6 3.7  CL 110 108 104 106 104  CO2 24 25 26 27 25   GLUCOSE 115* 124* 122* 112* 121*  BUN <5* <5* <5* <5* 7  CREATININE 0.47 0.57 0.44 0.47 0.37*  CALCIUM 8.4* 9.1 8.9 8.8* 8.5*   GFR: Estimated Creatinine Clearance: 87.2 mL/min (by C-G formula based on SCr of 0.37 mg/dL (L)). Liver Function Tests:  Recent Labs Lab 09/10/16 0840 09/11/16 0440 09/12/16 0540 09/13/16 0600 09/14/16 0503  AST 108* 120* 131* 139* 125*  ALT 238* 239* 230* 228* 228*  ALKPHOS 92 102 102 104 125  BILITOT 5.2* 5.6* 6.3* 6.7* 7.6*  PROT 6.5 6.7 6.7 6.3* 7.0  ALBUMIN 3.6 3.7 3.6 3.5 3.8    Recent Labs Lab 09/08/16 2147 09/14/16 0503  LIPASE 26 3,006*    Recent Labs Lab 09/08/16 2147  AMMONIA 46*   Coagulation Profile:  Recent Labs Lab 09/08/16 2147  INR 0.97   Cardiac Enzymes: No results for input(s): CKTOTAL, CKMB, CKMBINDEX, TROPONINI in the last 168 hours. BNP (last 3 results) No results for input(s): PROBNP in the last 8760 hours. HbA1C: No results for input(s): HGBA1C in the last 72 hours. CBG:  Recent Labs Lab 09/10/16 0804 09/11/16 0751 09/12/16 0743 09/13/16 0755 09/14/16 0749  GLUCAP 104* 151* 111* 124* 104*   Lipid Profile: No results for input(s): CHOL, HDL, LDLCALC, TRIG, CHOLHDL, LDLDIRECT in the last 72 hours. Thyroid Function Tests: No results for input(s): TSH, T4TOTAL, FREET4, T3FREE, THYROIDAB in the last 72 hours. Anemia Panel: No results for input(s): VITAMINB12, FOLATE, FERRITIN, TIBC, IRON,  RETICCTPCT in the last 72 hours. Urine analysis:    Component Value Date/Time   COLORURINE ORANGE (A) 09/08/2016 2000   APPEARANCEUR CLEAR 09/08/2016 2000   LABSPEC 1.019 09/08/2016 2000   LABSPEC 1.015 01/18/2011 1144   PHURINE 6.5 09/08/2016 2000   GLUCOSEU NEGATIVE 09/08/2016 2000   HGBUR TRACE (A) 09/08/2016 2000   BILIRUBINUR LARGE (A) 09/08/2016 2000   BILIRUBINUR 1 06/04/2015 1122   BILIRUBINUR Negative 01/18/2011 [place] 09/08/2016 2000   PROTEINUR NEGATIVE 09/08/2016 2000   UROBILINOGEN 0.2 06/04/2015 2235   NITRITE POSITIVE (A) 09/08/2016 2000   LEUKOCYTESUR SMALL (A) 09/08/2016 2000   LEUKOCYTESUR Trace 01/18/2011  1144   Sepsis Labs: @LABRCNTIP (procalcitonin:4,lacticidven:4) ) Recent Results (from the past 240 hour(s))  Urine culture     Status: Abnormal   Collection Time: 09/08/16 11:51 PM  Result Value Ref Range Status   Specimen Description URINE, CLEAN CATCH  Final   Special Requests NONE  Final   Culture MULTIPLE SPECIES PRESENT, SUGGEST RECOLLECTION (A)  Final   Report Status 09/10/2016 FINAL  Final         Radiology Studies: Ct Chest W Contrast  Result Date: 09/13/2016 CLINICAL DATA:  Metastatic breast cancer. Restaging. Diagnosed in 2012 with bilateral mastectomy. Metastasis in 2016. Oral chemotherapy in progress. EXAM: CT CHEST WITH CONTRAST TECHNIQUE: Multidetector CT imaging of the chest was performed during intravenous contrast administration. CONTRAST:  42mL ISOVUE-300 IOPAMIDOL (ISOVUE-300) INJECTION 61% COMPARISON:  Chest CT of 05/08/2016.  Abdominal MRI of 09/11/2016. FINDINGS: Cardiovascular: A right-sided Port-A-Cath which terminates at the mid right atrium. Aortic atherosclerosis. Mild cardiomegaly, without pericardial effusion. No central pulmonary embolism, on this non-dedicated study. Mediastinum/Nodes: Multiple sub cm hypo attenuating thyroid lesions. No supraclavicular adenopathy. Bilateral mastectomy. Left axillary node  dissection. No axillary adenopathy. No mediastinal or hilar adenopathy. No internal mammary adenopathy. Lungs/Pleura: No pleural fluid. Similar appearance of left upper lobe irregular interstitial opacities and septal thickening, likely lymphangitic tumor spread. Interval development of left greater than right basilar and dependent posterior right upper lobe airspace and ground-glass opacities. Upper Abdomen: Moderate hepatic steatosis. gallbladder contrast likely relates to today's ERCP. Possible gallbladder sludge. Pneumobilia. New biliary stent. No biliary duct dilatation. Normal imaged portions of the spleen, stomach, adrenal glands, kidneys. Since the 09/11/16 MRI, development of peripancreatic edema which is moderate, including on image 131/ series 2. Pancreas enhances normally. Musculoskeletal: Subcutaneous nodularity about the anterior right shoulder measures 11 mm on image 22/series 2 and is similar to the prior exam (when remeasured). Widespread sclerotic osseous metastasis. Index lesion in the left-sided T11 is similar at 1.3 cm. Sternal manubrial lesion is also not significantly changed. No complicating vertebral compression deformity. IMPRESSION: 1. Status post bilateral mastectomy. No thoracic adenopathy to suggest nodal metastasis. 2. Similar left upper lobe interstitial thickening which likely represents lymphangitic tumor spread. Bibasilar and posterior right upper lobe patchy airspace and ground-glass opacities are favored to represent infection or aspiration. Progressive lymphangitic tumor spread felt less likely. 3. Since 09/11/2016, placement of a biliary stent with resolution of biliary duct dilatation. 4. Interval development of pancreatitis since 09/11/2016. 5. Hepatic steatosis. 6. Similar subcutaneous nodularity about the anterior right shoulder. 7. Similar widespread sclerotic osseous metastasis. Electronically Signed   By: Abigail Miyamoto M.D.   On: 09/13/2016 16:42      Scheduled  Meds: . buPROPion  300 mg Oral Daily  . calcium carbonate  500 mg of elemental calcium Oral Daily  . cholecalciferol  1,000 Units Oral Daily  . citalopram  10 mg Oral Daily  . docusate sodium  100 mg Oral BID  . enoxaparin (LOVENOX) injection  40 mg Subcutaneous Q24H  . fentaNYL  25 mcg Transdermal Q72H  . gabapentin  300 mg Oral TID  . letrozole  2.5 mg Oral Daily  . LORazepam  1 mg Oral Daily  . LORazepam  2 mg Oral QHS  . nystatin  5 mL Oral QID  . oxyCODONE  25 mg Oral QID  . pantoprazole  40 mg Oral Daily  . polyethylene glycol  17 g Oral BID  . zolpidem  5 mg Oral QHS   Continuous Infusions: . sodium chloride  100 mL/hr at 09/14/16 1118     LOS: 5 days    Time spent in minutes: 56 min    Toccara Alford, MD [facility] Pager: [email or web address] Password [email or web address] 09/14/2016, 12:50 PM

## 2016-09-14 NOTE — Evaluation (Signed)
SLP Cancellation Note  Patient Details Name: ZURRI OBRYAN MRN: [id number] DOB: 1961-01-04   Cancelled treatment:       Reason Eval/Treat Not Completed: Medical issues which prohibited therapy (per RN Melene Muller, pt on clear liquid diet with very poor intake due to abdomen pain = SLP concerned MBS may be contraindicated with concern for bowel issue and abdomen pain, will follow up next date, MD please clarify if desire MBS, thanks)   Claudie Fisherman, [facility] SLP [phone]

## 2016-09-14 NOTE — Consult Note (Signed)
Consultation Note Date: 09/14/2016   Patient Name: Tina Vaughan  DOB: 10-09-1961  MRN: [id number]  Age / Sex: 55 y.o., female  PCP: Mackie Pai, PA-C Referring Physician: Debbe Odea, MD  Reason for Consultation: Establishing goals of care  HPI/Patient Profile: 55 y.o. female    admitted on 09/08/2016     Clinical Assessment and Goals of Care:  55 y.o. female with medical history significant of metastatic breast cancer (s/p of surgery and currently on Ibrance and letrozole), GERD, depression, anxiety, OSA on CPAP, edema, who presented on 10-27 with abdominal pain.    Patient has been worked up for her abdominal pain and abnormal liver functions. CT abdomen with fatty liver and peri portal adenopathy, MRCP with CBD obstruction, ERCP and stenting done for biliary stricture suspected to be from external compression. She now also has acute pancreatitis, ongoing abdominal pain. Palliative consult requested for pain management.   Patient is resting in bed, her sister is at the bedside. I introduced myself and palliative care as follows.  Palliative medicine is specialized medical care for people living with serious illness. It focuses on providing relief from the symptoms and stress of a serious illness. The goal is to improve quality of life for both the patient and the family.  Patient states she did not rest well overnight, she has some residual RUQ pain, she has more pain in her epigastric and generalized abdominal areas. She is mildly nauseous and constipated. She has met with Dr Lindi Adie earlier.   See recommendations below, we will continue to follow along. Thank you for the consult.   HCPOA  sister Arbie Cookey is HCPOA agent.   SUMMARY OF RECOMMENDATIONS    1. Increase strength of trans dermal fentanyl to 50 mcg, change Q 72 hours.  2. IV Dilaudid 1 mg IV Q 2 hours prn.  3. D/C PO Oxy IR. Patient with mild  nausea, patient has pancreatitis, has noted significant relief from IV Dilaudid. May resume PO Oxy IR on discharge 4. Augment bowel regimen.    Code Status/Advance Care Planning:  DNR    Symptom Management:    see above   Palliative Prophylaxis:   Bowel Regimen   Psycho-social/Spiritual:   Desire for further Chaplaincy support:no  Additional Recommendations: Caregiving  Support/Resources  Prognosis:   Guarded   Discharge Planning: to be determined.       Primary Diagnoses: Present on Admission: . Bone metastasis ([place]) . Breast cancer of lower-outer quadrant of left female breast ([place]) . Generalized anxiety disorder . Depression . Abnormal liver function . Abdominal pain . Diarrhea   I have reviewed the medical record, interviewed the patient and family, and examined the patient. The following aspects are pertinent.  Past Medical History:  Diagnosis Date  . Anxiety   . Breast cancer, stage 3 ([place]) 2012/ 05/2015   left; inflammatory  . Depression   . Eczema   . Frequent urination   . GERD (gastroesophageal reflux disease)   . Headache   . Lymphedema   .  Neuromuscular disorder ([place])   . 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 Obesity   . Sleep apnea   . Thyroid disease    Possible would like to be evaluated   Social History   Social History  . Marital status: Single    Spouse name: N/A  . Number of children: N/A  . Years of education: N/A   Occupational History  . Disabled    Social History Main Topics  . Smoking status: Never Smoker  . Smokeless tobacco: Never Used  . Alcohol use 0.0 oz/week     Comment: rare  . Drug use: No  . Sexual activity: Not Currently    Birth control/ protection: Post-menopausal   Other Topics Concern  . None   Social History Narrative   Lives alone in a one story home.  Has no children.  Does not work.     On disability since 2013 for breast cancer.     Education: some college.   Family  History  Problem Relation Age of Onset  . Breast cancer Mother   . Pancreatic cancer Mother     pancreatic cancer x 2  . Diabetes Mother     after pancreas removed  . Stroke Father     heavy smoker  . Hypertension Father   . Heart disease Father   . Healthy Sister   . Migraines Sister   . Emphysema Paternal Grandfather     smoked a pipe  . Colon cancer Neg Hx   . Esophageal cancer Neg Hx   . Colitis Neg Hx   . Crohn's disease Neg Hx    Scheduled Meds: . buPROPion  300 mg Oral Daily  . calcium carbonate  500 mg of elemental calcium Oral Daily  . cholecalciferol  1,000 Units Oral Daily  . citalopram  10 mg Oral Daily  . enoxaparin (LOVENOX) injection  40 mg Subcutaneous Q24H  . fentaNYL  50 mcg Transdermal Q72H  . gabapentin  300 mg Oral TID  . letrozole  2.5 mg Oral Daily  . LORazepam  1 mg Oral Daily  . LORazepam  2 mg Oral QHS  . nystatin  5 mL Oral QID  . ondansetron (ZOFRAN) IV  4 mg Intravenous Q6H  . pantoprazole  40 mg Oral Daily  . polyethylene glycol  17 g Oral BID  . senna  2 tablet Oral QHS  . zolpidem  5 mg Oral QHS   Continuous Infusions: . sodium chloride 100 mL/hr at 09/14/16 1118   PRN Meds:.alum & mag hydroxide-simeth, fluocinonide cream, HYDROmorphone (DILAUDID) injection, ibuprofen, lidocaine-prilocaine, sodium chloride flush Medications Prior to Admission:  Prior to Admission medications   Medication Sig Start Date End Date Taking? Authorizing Provider  buPROPion (WELLBUTRIN XL) 300 MG 24 hr tablet Take 300 mg by mouth daily.   Yes Historical Provider, MD  calcium carbonate (OSCAL) 1500 (600 Ca) MG TABS tablet Take 600 mg of elemental calcium by mouth daily.   Yes Historical Provider, MD  cholecalciferol (VITAMIN D) 1000 UNITS tablet Take 1,000 Units by mouth daily.    Yes Historical Provider, MD  citalopram (CELEXA) 40 MG tablet Take 10 mg by mouth daily.   Yes Historical Provider, MD  fentaNYL (DURAGESIC - DOSED MCG/HR) 25 MCG/HR patch Place 1  patch (25 mcg total) onto the skin every 3 (three) days. 09/05/16  Yes Nicholas Lose, MD  fluocinonide cream (LIDEX) 0.99 % Apply 1 application topically 2 (two) times  daily as needed (for rash.).   Yes Historical Provider, MD  gabapentin (NEURONTIN) 300 MG capsule Take 300 mg by mouth 3 (three) times daily.   Yes Historical Provider, MD  ibuprofen (ADVIL,MOTRIN) 200 MG tablet Take 600 mg by mouth every 8 (eight) hours as needed for mild pain or moderate pain.    Yes Historical Provider, MD  letrozole (FEMARA) 2.5 MG tablet Take 2.5 mg by mouth daily.   Yes Historical Provider, MD  lidocaine-prilocaine (EMLA) cream Apply 1 application topically as needed (prior to accessing port).   Yes Historical Provider, MD  LORazepam (ATIVAN) 1 MG tablet Take 1-2 mg by mouth 2 (two) times daily. Pt takes one tablet in the morning and two at bedtime.   Yes Historical Provider, MD  naproxen sodium (ANAPROX) 220 MG tablet Take 220-440 mg by mouth 2 (two) times daily as needed (for pain).    Yes Historical Provider, MD  omeprazole (PRILOSEC) 20 MG capsule Take 20 mg by mouth daily.   Yes Historical Provider, MD  ondansetron (ZOFRAN) 8 MG tablet Take 8 mg by mouth every 8 (eight) hours as needed for nausea or vomiting.   Yes Historical Provider, MD  oxyCODONE (ROXICODONE) 15 MG immediate release tablet Take 1 tablet (15 mg total) by mouth every 4 (four) hours as needed. for pain 09/05/16  Yes Nicholas Lose, MD  palbociclib [facility]) 125 MG capsule Take 1 capsule (125 mg total) by mouth daily with breakfast. Take whole with food for 21 days, then take 7 days off 05/26/16  Yes Nicholas Lose, MD  zolpidem (AMBIEN) 10 MG tablet Take 10 mg by mouth at bedtime.   Yes Historical Provider, MD   Allergies  Allergen Reactions  . Doxycycline Hydrochloride Nausea And Vomiting   Review of Systems + for abdominal epigastric pain, RUQ pain.  + for mild nausea + for mild constipation  Physical Exam Young lady resting in bed S1  S2 Clear Abdomen epigastric and RUQ tenderness, mild distension Trace edema Non focal  Vital Signs: BP 123/80 (BP Location: Right Arm)   Pulse 92   Temp 98.9 F (37.2 C) (Oral)   Resp 16   Ht '5\' 2"'$  (1.575 m)   Wt 96.7 kg (213 lb 3 oz)   LMP 05/08/2016   SpO2 96%   BMI 38.99 kg/m  Pain Assessment: 0-10   Pain Score: 4    SpO2: SpO2: 96 % O2 Device:SpO2: 96 % O2 Flow Rate: .O2 Flow Rate (L/min): 2 L/min  IO: Intake/output summary:  Intake/Output Summary (Last 24 hours) at 09/14/16 1330 Last data filed at 09/14/16 0600  Gross per 24 hour  Intake             1710 ml  Output                0 ml  Net             1710 ml    LBM: Last BM Date: 09/11/16 Baseline Weight: Weight: 98.9 kg (218 lb) Most recent weight: Weight: 96.7 kg (213 lb 3 oz)     Palliative Assessment/Data:   Flowsheet Rows   Flowsheet Row Most Recent Value  Intake Tab  Referral Department  Hospitalist  Unit at Time of Referral  Oncology Unit  Palliative Care Primary Diagnosis  Cancer  Palliative Care Type  New Palliative care  Reason for referral  Pain  Date first seen by Palliative Care  09/14/16  Clinical Assessment  Palliative Performance Scale Score  30%  Pain Max last 24 hours  8  Pain Min Last 24 hours  5  Psychosocial & Spiritual Assessment  Palliative Care Outcomes  Patient/Family meeting held?  Yes  Who was at the meeting?  sister HCPOA   Palliative Care Outcomes  Improved pain interventions  Palliative Care follow-up planned  Yes, Facility      Time In: 12 Time Out: 1310 Time Total: 70 min  Greater than 50%  of this time was spent counseling and coordinating care related to the above assessment and plan.  Signed by: Loistine Chance, MD  [phone]  Please contact Palliative Medicine Team phone at [phone] for questions and concerns.  For individual provider: See Shea Evans

## 2016-09-14 NOTE — Progress Notes (Signed)
Pt stated that she would self administer CPAP when ready for bed.  RT to monitor and assess as needed.

## 2016-09-14 NOTE — Progress Notes (Signed)
Tina Vaughan 3:00 PM  Subjective: Patient looks better than I thought and her case discussed with the hospital team as well as her sister and we discussed pancreatitis as well as to this stent positioning and she has no other complaints and is burping but not passing gas from below but is walking okay  Objective: Vital signs stable afebrile no acute distress lying comfortably in the chair abdomen is surprisingly soft occasional bowel sounds nontender on palpation white count okay lipase increased liver tests no better chest CT reviewed  Assessment: ERCP with probable pancreatitis and possible stent migration  Plan: Will check on tomorrow and await repeat labs and depending on liver tests when pancreatitis improved consider stent replacement  [facility] E  Pager [phone] After 5PM or if no answer call [phone]

## 2016-09-14 NOTE — Care Management Note (Signed)
Case Management Note  Patient Details  Name: Tina Vaughan MRN: [id number] Date of Birth: 03-31-1961  Subjective/Objective:   55 yo admitted with abdominal pain.                  Action/Plan: Pt from home alone. Chart reviewed and CM following for DC needs.  Expected Discharge Date:  09/12/16               Expected Discharge Plan:  Home/Self Care  In-House Referral:     Discharge planning Services  CM Consult  Post Acute Care Choice:    Choice offered to:     DME Arranged:    DME Agency:     HH Arranged:    HH Agency:     Status of Service:  In process, will continue to follow  If discussed at Long Length of Stay Meetings, dates discussed:    Additional CommentsLynnell Catalan, RN 09/14/2016, 10:44 AM  [phone]

## 2016-09-15 ENCOUNTER — Inpatient Hospital Stay (HOSPITAL_COMMUNITY): Payer: Medicare Other

## 2016-09-15 DIAGNOSIS — K831 Obstruction of bile duct: Secondary | ICD-10-CM

## 2016-09-15 DIAGNOSIS — F411 Generalized anxiety disorder: Secondary | ICD-10-CM

## 2016-09-15 DIAGNOSIS — K859 Acute pancreatitis without necrosis or infection, unspecified: Secondary | ICD-10-CM | POA: Diagnosis not present

## 2016-09-15 LAB — COMPREHENSIVE METABOLIC PANEL
ALT: 167 U/L — AB (ref 14–54)
ANION GAP: 6 (ref 5–15)
AST: 73 U/L — ABNORMAL HIGH (ref 15–41)
Albumin: 3.7 g/dL (ref 3.5–5.0)
Alkaline Phosphatase: 138 U/L — ABNORMAL HIGH (ref 38–126)
BUN: 6 mg/dL (ref 6–20)
CHLORIDE: 105 mmol/L (ref 101–111)
CO2: 25 mmol/L (ref 22–32)
Calcium: 8.8 mg/dL — ABNORMAL LOW (ref 8.9–10.3)
Creatinine, Ser: 0.49 mg/dL (ref 0.44–1.00)
Glucose, Bld: 106 mg/dL — ABNORMAL HIGH (ref 65–99)
POTASSIUM: 3.8 mmol/L (ref 3.5–5.1)
SODIUM: 136 mmol/L (ref 135–145)
Total Bilirubin: 4.2 mg/dL — ABNORMAL HIGH (ref 0.3–1.2)
Total Protein: 7.1 g/dL (ref 6.5–8.1)

## 2016-09-15 LAB — GLUCOSE, CAPILLARY: GLUCOSE-CAPILLARY: 106 mg/dL — AB (ref 65–99)

## 2016-09-15 LAB — LIPASE, BLOOD: LIPASE: 1326 U/L — AB (ref 11–51)

## 2016-09-15 NOTE — Progress Notes (Signed)
MBSS complete. Full report located under chart review in imaging section. Will sign off; please re-consult if needs arise.  Blair Heys, [place], [place]

## 2016-09-15 NOTE — Progress Notes (Signed)
SLP Cancellation Note  Patient Details Name: Tina Vaughan MRN: [id number] DOB: 05-23-1961   Cancelled treatment:       Reason Eval/Treat Not Completed: Other (comment). MD please clarify if MBS is still needed for this pt. Thank you.   Kern Reap, CCC-SLP 09/15/2016, 9:53 AM [phone]

## 2016-09-15 NOTE — Progress Notes (Addendum)
SLP Cancellation Note  Patient Details Name: Tina Vaughan MRN: [id number] DOB: 1961-01-06   Cancelled treatment:       Reason Eval/Treat Not Completed: Other (comment) Spoke with MD. Plan for MBS this afternoon due to concerns with aspiration given right lobe infiltrate.   Kern Reap, MA, CCC-SLP 09/15/2016, 10:00 AM [phone]

## 2016-09-15 NOTE — Progress Notes (Signed)
[name] 8:34 AM  Subjective: Patient doing better and wants to try to advance diet and her pain is less but still no air from below  Objective: Vital signs stable afebrile no acute distress lungs are clear heart decreased heart sounds abdomen is soft minimal tenderness occasional bowel sounds liver tests decreased lipase decreased  Assessment: Status post stent with probable ERCP pancreatitis  Plan: Will advance to full liquids. May return to clear liquids when necessary and we'll follow labs and get a abdominal x-ray to evaluate her stent and consider stent exchange if needed  [facility] E  Pager [phone] After 5PM or if no answer call [phone]

## 2016-09-15 NOTE — Progress Notes (Signed)
PROGRESS NOTE    Tina Vaughan  [id number] DOB: Jul 23, 1961 DOA: 09/08/2016  PCP: Mackie Pai, PA-C   Brief Narrative:  55 y.o.femalewith medical history significant of metastatic breast cancer (s/p of surgery and currently on Ibrance and letrozole), GERD, depression, anxiety, OSA on CPAP, edema, who presents with abdominal pain.  Pt states that she has been having abdominal pain in the past 5 days, which is new. Her abdominal pain is located in the right upper quadrant, constant, 5 out of 10 in severity, radiating to the right back. She also hasnausea, but no vomiting. She has been having diarrhea in the past 4 days. She states that she was on stool softener, but she continues to have diarrhea after she stopped stool softer 3 days ago. She has 5-7 bowel movementswith loose stool each day. No fever or chills. Patient denies symptoms of UTI. She does not have chest pain, shortness of breath, cough, unilateral weakness   Subjective: Continues to have pain in mid upper abdomen radiates to her back but not as severe, RUQ pain resolved, feels like she can advance to full liquids.   Assessment & Plan:  Abdominal pain and Abnormal liver function: -Presenting ALP 101, AST 110, ALT 311 and total bilirubin 5.1. -CT abd with fatty liver and periportal adenopathy  - Abd [name] with fatty liver   - MRCP demonstrated suspicion for CBD obstruction -  ERCP shows biliary stricture suspected to be from external compression (from lymph nodes) which has been stented -LFT's improving  Acute pancreatitis - due to ERCP- cut back on IVF- getting up at night 4-5 x to urinate - being advanced to full liquids   Breast cancer of lower-outer quadrant of left female breast [facility]):  -Patient is s/p mastectomy -Seen by Dr. Lindi Adie -Patient is continued on Ibrance and letrozole  Depression and anxiety: -Mood seems stable at present -Will continue Wellbutrin, Celexa and Ativan as tolerated  Recent  Diarrhea/ grey stools -No further diarrhea noted  Positive urinalysis:  -Initial UA was suggestive of possible UTI -Patient is now s/p fosfomycin  -Urine culture with multiple species present -Afebrile  Fatty liver - noted on imaging    DVT prophylaxis: Lovenox Code Status: DNR Family Communication:  Disposition Plan: home when stable Consultants:   GI Procedures:   ERCP 11/1 Antimicrobials:  Anti-infectives    Start     Dose/Rate Route Frequency Ordered Stop   09/13/16 1145  ciprofloxacin (CIPRO) IVPB 400 mg     400 mg 200 mL/hr over 60 Minutes Intravenous  Once 09/13/16 1137 09/13/16 1154       Objective: Vitals:   09/14/16 1403 09/14/16 2035 09/14/16 2215 09/15/16 0505  BP: 121/81 (!) 137/93    Pulse: (!) 104 98  (!) 108  Resp: 18 20 18 20   Temp: 98.4 F (36.9 C) 98.9 F (37.2 C)  99.3 F (37.4 C)  TempSrc: Oral Oral  Oral  SpO2: 91% 94%  96%  Weight:      Height:        Intake/Output Summary (Last 24 hours) at 09/15/16 1217 Last data filed at 09/15/16 0730  Gross per 24 hour  Intake              250 ml  Output             4150 ml  Net            -3900 ml   Filed Weights   09/08/16 1843 09/09/16 0127  Weight: 98.9 kg (218 lb) 96.7 kg (213 lb 3 oz)    Examination: General exam: Appears comfortable  HEENT: PERRLA, oral mucosa moist, no sclera icterus or thrush Respiratory system: Clear to auscultation. Respiratory effort normal. Cardiovascular system: S1 & S2 heard, RRR.  No murmurs  Gastrointestinal system: Abdomen soft,  Tender in epigastrium, nondistended. Normal bowel sound. No organomegaly Central nervous system: Alert and oriented. No focal neurological deficits. Extremities: No cyanosis, clubbing or edema Skin: No rashes or ulcers Psychiatry:  Mood & affect appropriate.     Data Reviewed: I have personally reviewed following labs and imaging studies  CBC:  Recent Labs Lab 09/08/16 2147 09/09/16 0130 09/10/16 0415  09/10/16 0840 09/12/16 0540 09/14/16 0503  WBC 2.9* 2.7* 2.1* 2.0* 2.2* 4.6  NEUTROABS 1.8  --   --   --   --  3.7  HGB 12.4 11.6* 11.6* 11.4* 11.8* 13.0  HCT 35.0* 32.9* 33.8* 32.8* 33.5* 37.7  MCV 91.1 93.5 94.2 92.1 93.6 94.0  PLT 153 134* 146* 140* 135* 0000000   Basic Metabolic Panel:  Recent Labs Lab 09/11/16 0440 09/12/16 0540 09/13/16 0600 09/14/16 0503 09/15/16 0625  NA 139 138 139 137 136  K 3.4* 3.5 3.6 3.7 3.8  CL 108 104 106 104 105  CO2 25 26 27 25 25   GLUCOSE 124* 122* 112* 121* 106*  BUN <5* <5* <5* 7 6  CREATININE 0.57 0.44 0.47 0.37* 0.49  CALCIUM 9.1 8.9 8.8* 8.5* 8.8*   GFR: Estimated Creatinine Clearance: 87.2 mL/min (by C-G formula based on SCr of 0.49 mg/dL). Liver Function Tests:  Recent Labs Lab 09/11/16 0440 09/12/16 0540 09/13/16 0600 09/14/16 0503 09/15/16 0625  AST 120* 131* 139* 125* 73*  ALT 239* 230* 228* 228* 167*  ALKPHOS 102 102 104 125 138*  BILITOT 5.6* 6.3* 6.7* 7.6* 4.2*  PROT 6.7 6.7 6.3* 7.0 7.1  ALBUMIN 3.7 3.6 3.5 3.8 3.7    Recent Labs Lab 09/08/16 2147 09/14/16 0503 09/15/16 0625  LIPASE 26 3,006* 1,326*    Recent Labs Lab 09/08/16 2147  AMMONIA 46*   Coagulation Profile:  Recent Labs Lab 09/08/16 2147  INR 0.97   Cardiac Enzymes: No results for input(s): CKTOTAL, CKMB, CKMBINDEX, TROPONINI in the last 168 hours. BNP (last 3 results) No results for input(s): PROBNP in the last 8760 hours. HbA1C: No results for input(s): HGBA1C in the last 72 hours. CBG:  Recent Labs Lab 09/10/16 0804 09/11/16 0751 09/12/16 0743 09/13/16 0755 09/14/16 0749  GLUCAP 104* 151* 111* 124* 104*   Lipid Profile: No results for input(s): CHOL, HDL, LDLCALC, TRIG, CHOLHDL, LDLDIRECT in the last 72 hours. Thyroid Function Tests: No results for input(s): TSH, T4TOTAL, FREET4, T3FREE, THYROIDAB in the last 72 hours. Anemia Panel: No results for input(s): VITAMINB12, FOLATE, FERRITIN, TIBC, IRON, RETICCTPCT in the  last 72 hours. Urine analysis:    Component Value Date/Time   COLORURINE ORANGE (A) 09/08/2016 2000   APPEARANCEUR CLEAR 09/08/2016 2000   LABSPEC 1.019 09/08/2016 2000   LABSPEC 1.015 01/18/2011 1144   PHURINE 6.5 09/08/2016 2000   GLUCOSEU NEGATIVE 09/08/2016 2000   HGBUR TRACE (A) 09/08/2016 2000   BILIRUBINUR LARGE (A) 09/08/2016 2000   BILIRUBINUR 1 06/04/2015 1122   BILIRUBINUR Negative 01/18/2011 1144   KETONESUR NEGATIVE 09/08/2016 2000   PROTEINUR NEGATIVE 09/08/2016 2000   UROBILINOGEN 0.2 06/04/2015 2235   NITRITE POSITIVE (A) 09/08/2016 2000   LEUKOCYTESUR SMALL (A) 09/08/2016 2000   LEUKOCYTESUR Trace 01/18/2011 1144  Sepsis Labs: @LABRCNTIP (procalcitonin:4,lacticidven:4) ) Recent Results (from the past 240 hour(s))  Urine culture     Status: Abnormal   Collection Time: 09/08/16 11:51 PM  Result Value Ref Range Status   Specimen Description URINE, CLEAN CATCH  Final   Special Requests NONE  Final   Culture MULTIPLE SPECIES PRESENT, SUGGEST RECOLLECTION (A)  Final   Report Status 09/10/2016 FINAL  Final         Radiology Studies: Ct Chest W Contrast  Result Date: 09/13/2016 CLINICAL DATA:  Metastatic breast cancer. Restaging. Diagnosed in 2012 with bilateral mastectomy. Metastasis in 2016. Oral chemotherapy in progress. EXAM: CT CHEST WITH CONTRAST TECHNIQUE: Multidetector CT imaging of the chest was performed during intravenous contrast administration. CONTRAST:  48mL ISOVUE-300 IOPAMIDOL (ISOVUE-300) INJECTION 61% COMPARISON:  Chest CT of 05/08/2016.  Abdominal MRI of 09/11/2016. FINDINGS: Cardiovascular: A right-sided Port-A-Cath which terminates at the mid right atrium. Aortic atherosclerosis. Mild cardiomegaly, without pericardial effusion. No central pulmonary embolism, on this non-dedicated study. Mediastinum/Nodes: Multiple sub cm hypo attenuating thyroid lesions. No supraclavicular adenopathy. Bilateral mastectomy. Left axillary node dissection. No  axillary adenopathy. No mediastinal or hilar adenopathy. No internal mammary adenopathy. Lungs/Pleura: No pleural fluid. Similar appearance of left upper lobe irregular interstitial opacities and septal thickening, likely lymphangitic tumor spread. Interval development of left greater than right basilar and dependent posterior right upper lobe airspace and ground-glass opacities. Upper Abdomen: Moderate hepatic steatosis. gallbladder contrast likely relates to today's ERCP. Possible gallbladder sludge. Pneumobilia. New biliary stent. No biliary duct dilatation. Normal imaged portions of the spleen, stomach, adrenal glands, kidneys. Since the 09/11/16 MRI, development of peripancreatic edema which is moderate, including on image 131/ series 2. Pancreas enhances normally. Musculoskeletal: Subcutaneous nodularity about the anterior right shoulder measures 11 mm on image 22/series 2 and is similar to the prior exam (when remeasured). Widespread sclerotic osseous metastasis. Index lesion in the left-sided T11 is similar at 1.3 cm. Sternal manubrial lesion is also not significantly changed. No complicating vertebral compression deformity. IMPRESSION: 1. Status post bilateral mastectomy. No thoracic adenopathy to suggest nodal metastasis. 2. Similar left upper lobe interstitial thickening which likely represents lymphangitic tumor spread. Bibasilar and posterior right upper lobe patchy airspace and ground-glass opacities are favored to represent infection or aspiration. Progressive lymphangitic tumor spread felt less likely. 3. Since 09/11/2016, placement of a biliary stent with resolution of biliary duct dilatation. 4. Interval development of pancreatitis since 09/11/2016. 5. Hepatic steatosis. 6. Similar subcutaneous nodularity about the anterior right shoulder. 7. Similar widespread sclerotic osseous metastasis. Electronically Signed   By: Abigail Miyamoto M.D.   On: 09/13/2016 16:42   Dg Ercp Biliary & Pancreatic  Ducts  Result Date: 09/13/2016 CLINICAL DATA:  Stricture EXAM: ERCP TECHNIQUE: Multiple spot images obtained with the fluoroscopic device and submitted for interpretation post-procedure. FLUOROSCOPY TIME:  Fluoroscopy Time:  3 minutes and 25 seconds Radiation Exposure Index (if provided by the fluoroscopic device): 64.45 mil Number of Acquired Spot Images: 6 COMPARISON:  None. FINDINGS: Imaging demonstrates a long segment stricture in the common bile duct. Final image demonstrates placement of a metallic stent across the stricture. The stent is patent. IMPRESSION: See above. These images were submitted for radiologic interpretation only. Please see the procedural report for the amount of contrast and the fluoroscopy time utilized. Electronically Signed   By: Marybelle Killings M.D.   On: 09/13/2016 14:15   Dg Abd 2 Views  Result Date: 09/15/2016 CLINICAL DATA:  Displacement of biliary stent. EXAM: ABDOMEN - 2 VIEW  COMPARISON:  ERCP [id number] FINDINGS: Metallic biliary stent is seen in the right upper quadrant which appears to be in stable position when compared to prior he ERCP. There is pneumobilia noted within the liver. Nonobstructive bowel gas pattern with moderate gas in stool within the colon. Calcified fibroids in the pelvis. No free air. IMPRESSION: Metallic biliary stent appears to be in stable position when compared to prior ERCP. Pneumobilia. Moderate stool in the colon. Electronically Signed   By: Rolm Baptise M.D.   On: 09/15/2016 10:21   Dg Swallowing Func-speech Pathology  Result Date: 09/15/2016 Objective Swallowing Evaluation: Type of Study: MBS-Modified Barium Swallow Study Patient Details Name: MAUNA GLAZEWSKI MRN: [id number] Date of Birth: September 26, 1961 Today's Date: 09/15/2016 Time: SLP Start Time (ACUTE ONLY): 1045-SLP Stop Time (ACUTE ONLY): 1100 SLP Time Calculation (min) (ACUTE ONLY): 15 min Past Medical History: Past Medical History: Diagnosis Date . Anxiety  . Breast cancer, stage 3 ([place]) 2012/  05/2015  left; inflammatory . Depression  . Eczema  . Frequent urination  . GERD (gastroesophageal reflux disease)  . Headache  . Lymphedema  . Neuromuscular disorder ([place])  . 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 Obesity  . Sleep apnea  . Thyroid disease   Possible would like to be evaluated Past Surgical History: Past Surgical History: Procedure Laterality Date . ERCP N/A 09/13/2016  Procedure: ENDOSCOPIC RETROGRADE CHOLANGIOPANCREATOGRAPHY (ERCP);  Surgeon: Clarene Essex, MD;  Location: [facility];  Service: Endoscopy;  Laterality: N/A; . LAPAROSCOPY  03/14/2012  Procedure: LAPAROSCOPY OPERATIVE;  Surgeon: Osborne Oman, MD;  Location: [facility];  Service: Gynecology;  Laterality: N/A; . MASTECTOMY Bilateral 05/12/11  total mastectomy on right, radical mastectomy on left . OOPHORECTOMY   . PORT-A-CATH REMOVAL  04/19/2012  Procedure: MINOR REMOVAL PORT-A-CATH;  Surgeon: Haywood Lasso, MD;  Location: [facility];  Service: General;  Laterality: N/A; . Portacath    Placement  . PORTACATH PLACEMENT   . SALPINGOOPHORECTOMY  03/14/2012  Procedure: SALPINGO OOPHERECTOMY;  Surgeon: Osborne Oman, MD;  Location: [facility];  Service: Gynecology;  Laterality: Bilateral; HPI: Pt is a 55 y.o. female with PMH of metastatic breast cancer, GERD, depression, anxiety, OSA on CPAP, edema, who presented with abdominal pain on 10/28. Chest CT on 11/1 indicated RLL and posterior RUL airspace and ground-glass opacities. MD with concern for aspiration and ordered MBS to rule out. No Data Recorded Assessment / Plan / Recommendation CHL IP CLINICAL IMPRESSIONS 09/15/2016 Therapy Diagnosis WFL Clinical Impression Pt with normal swallow function. Swallow is timely. No penetration or aspiration noted, no significant residuals post-swallow. Will defer diet recommendation to MD as pt is currently on full liquid; pt had no oropharyngeal difficulty with regular solid. Will sign off at this time; please re-consult if  needs arise.  Impact on safety and function Mild aspiration risk   CHL IP TREATMENT RECOMMENDATION 09/15/2016 Treatment Recommendations No treatment recommended at this time   No flowsheet data found. CHL IP DIET RECOMMENDATION 09/15/2016 SLP Diet Recommendations Other (Comment) Liquid Administration via Cup;Straw Medication Administration Whole meds with liquid Compensations -- Postural Changes Seated upright at 90 degrees   CHL IP OTHER RECOMMENDATIONS 09/15/2016 Recommended Consults -- Oral Care Recommendations Oral care BID Other Recommendations --   CHL IP FOLLOW UP RECOMMENDATIONS 09/15/2016 Follow up Recommendations None   No flowsheet data found.     CHL IP ORAL PHASE 09/15/2016 Oral Phase WFL Oral - Pudding Teaspoon -- Oral - Pudding Cup --  Oral - Honey Teaspoon -- Oral - Honey Cup -- Oral - Nectar Teaspoon -- Oral - Nectar Cup -- Oral - Nectar Straw -- Oral - Thin Teaspoon -- Oral - Thin Cup -- Oral - Thin Straw -- Oral - Puree -- Oral - Mech Soft -- Oral - Regular -- Oral - Multi-Consistency -- Oral - Pill -- Oral Phase - Comment --  CHL IP PHARYNGEAL PHASE 09/15/2016 Pharyngeal Phase WFL Pharyngeal- Pudding Teaspoon -- Pharyngeal -- Pharyngeal- Pudding Cup -- Pharyngeal -- Pharyngeal- Honey Teaspoon -- Pharyngeal -- Pharyngeal- Honey Cup -- Pharyngeal -- Pharyngeal- Nectar Teaspoon -- Pharyngeal -- Pharyngeal- Nectar Cup -- Pharyngeal -- Pharyngeal- Nectar Straw -- Pharyngeal -- Pharyngeal- Thin Teaspoon -- Pharyngeal -- Pharyngeal- Thin Cup -- Pharyngeal -- Pharyngeal- Thin Straw -- Pharyngeal -- Pharyngeal- Puree -- Pharyngeal -- Pharyngeal- Mechanical Soft -- Pharyngeal -- Pharyngeal- Regular -- Pharyngeal -- Pharyngeal- Multi-consistency -- Pharyngeal -- Pharyngeal- Pill -- Pharyngeal -- Pharyngeal Comment --  CHL IP CERVICAL ESOPHAGEAL PHASE 09/15/2016 Cervical Esophageal Phase WFL Pudding Teaspoon -- Pudding Cup -- Honey Teaspoon -- Honey Cup -- Nectar Teaspoon -- Nectar Cup -- Nectar Straw -- Thin  Teaspoon -- Thin Cup -- Thin Straw -- Puree -- Mechanical Soft -- Regular -- Multi-consistency -- Pill -- Cervical Esophageal Comment -- CHL IP GO 03/27/2016 Functional Assessment Tool Used (None) Functional Limitations Motor speech Swallow Current Status [phone]) (None) Swallow Goal Status [id number]) (None) Swallow Discharge Status [id number]) (None) Motor Speech Current Status [id number]) CJ Motor Speech Goal Status UK:[id number]) CJ Motor Speech Goal Status [id number]) CJ Spoken Language Comprehension Current Status [id number]) (None) Spoken Language Comprehension Goal Status [id number]) (None) Spoken Language Comprehension Discharge Status [id number]) (None) Spoken Language Expression Current Status [id number]) (None) Spoken Language Expression Goal Status [id number]) (None) Spoken Language Expression Discharge Status [id number]) (None) Attention Current Status [id number]) (None) Attention Goal Status [id number]) (None) Attention Discharge Status [id number]) (None) Memory Current Status [id number]) (None) Memory Goal Status [id number]) (None) Memory Discharge Status QC:115444) (None) Voice Current Status [id number]) (None) Voice Goal Status [id number]) (None) Voice Discharge Status [id number]) (None) Other Speech-Language Pathology Functional Limitation [id number]) (None) Other Speech-Language Pathology Functional Limitation Goal Status [id number]) (None) Other Speech-Language Pathology Functional Limitation Discharge Status [phone]) (None) Kern Reap, MA, CCC-SLP 09/15/2016, 11:24 AM [id number]                Scheduled Meds: . buPROPion  300 mg Oral Daily  . calcium carbonate  500 mg of elemental calcium Oral Daily  . cholecalciferol  1,000 Units Oral Daily  . citalopram  10 mg Oral Daily  . enoxaparin (LOVENOX) injection  40 mg Subcutaneous Q24H  . fentaNYL  50 mcg Transdermal Q72H  . gabapentin  300 mg Oral TID  . letrozole  2.5 mg Oral Daily  . LORazepam  1 mg Oral Daily  . LORazepam  2 mg Oral QHS  . nystatin  5 mL Oral QID  . ondansetron (ZOFRAN) IV  4 mg Intravenous Q6H  .  pantoprazole  40 mg Oral Daily  . polyethylene glycol  17 g Oral BID  . senna  2 tablet Oral QHS  . zolpidem  5 mg Oral QHS   Continuous Infusions: . sodium chloride 100 mL/hr at 09/15/16 0752     LOS: 6 days    Time spent in minutes: 67 min    Danity Schmelzer, MD [facility] Pager: [email or web address] Password [email or web address] 09/15/2016, 12:17 PM

## 2016-09-15 NOTE — Progress Notes (Signed)
Pt stated that she will self administer CPAP when ready for bed.  RT to monitor and assess as needed.

## 2016-09-15 NOTE — Progress Notes (Signed)
Daily Progress Note   Patient Name: Tina Vaughan       Date: 09/15/2016 DOB: 04-Jun-1961  Age: 55 y.o. MRN#: [id number] Attending Physician: Debbe Odea, MD Primary Care Physician: Elise Benne Admit Date: 09/08/2016  Reason for Consultation/Follow-up: pain management   Subjective:  awake alert rested well overnight  Resting in bed Tolerating diet Abdominal pain some what improved See below   Length of Stay: 6  Current Medications: Scheduled Meds:  . buPROPion  300 mg Oral Daily  . calcium carbonate  500 mg of elemental calcium Oral Daily  . cholecalciferol  1,000 Units Oral Daily  . citalopram  10 mg Oral Daily  . enoxaparin (LOVENOX) injection  40 mg Subcutaneous Q24H  . fentaNYL  50 mcg Transdermal Q72H  . gabapentin  300 mg Oral TID  . letrozole  2.5 mg Oral Daily  . LORazepam  1 mg Oral Daily  . LORazepam  2 mg Oral QHS  . nystatin  5 mL Oral QID  . ondansetron (ZOFRAN) IV  4 mg Intravenous Q6H  . pantoprazole  40 mg Oral Daily  . polyethylene glycol  17 g Oral BID  . senna  2 tablet Oral QHS  . zolpidem  5 mg Oral QHS    Continuous Infusions: . sodium chloride 100 mL/hr at 09/15/16 0752    PRN Meds: alum & mag hydroxide-simeth, fluocinonide cream, HYDROmorphone (DILAUDID) injection, ibuprofen, lidocaine-prilocaine, sodium chloride flush  Physical Exam         Awake alert NAD Abdominal distension mild Faint bowel sounds S1 S2 Clear  Vital Signs: BP 128/80 (BP Location: Right Arm)   Pulse (!) 105   Temp 98.8 F (37.1 C) (Oral)   Resp 16   Ht '5\' 2"'$  (1.575 m)   Wt 96.7 kg (213 lb 3 oz)   LMP 05/08/2016   SpO2 91%   BMI 38.99 kg/m  SpO2: SpO2: 91 % O2 Device: O2 Device: Not Delivered O2 Flow Rate: O2 Flow Rate (L/min): 2  L/min  Intake/output summary:  Intake/Output Summary (Last 24 hours) at 09/15/16 1541 Last data filed at 09/15/16 1510  Gross per 24 hour  Intake              250 ml  Output  5250 ml  Net            -5000 ml   LBM: Last BM Date: 09/11/16 Baseline Weight: Weight: 98.9 kg (218 lb) Most recent weight: Weight: 96.7 kg (213 lb 3 oz)       Palliative Assessment/Data:    Flowsheet Rows   Flowsheet Row Most Recent Value  Intake Tab  Referral Department  Hospitalist  Unit at Time of Referral  Oncology Unit  Palliative Care Primary Diagnosis  Cancer  Palliative Care Type  Return patient Palliative Care  Reason for referral  Pain  Date first seen by Palliative Care  09/14/16  Clinical Assessment  Palliative Performance Scale Score  40%  Pain Max last 24 hours  7  Pain Min Last 24 hours  4  Dyspnea Max Last 24 Hours  4  Dyspnea Min Last 24 hours  3  Nausea Max Last 24 Hours  3  Nausea Min Last 24 Hours  2  Anxiety Max Last 24 Hours  3  Psychosocial & Spiritual Assessment  Palliative Care Outcomes  Patient/Family meeting held?  Yes  Who was at the meeting?  patient who is decisional, sister was not in the room today   Palliative Care Outcomes  Improved pain interventions  Palliative Care follow-up planned  Yes, Facility      Patient Active Problem List   Diagnosis Date Noted  . 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 Acute pancreatitis without necrosis or infection, unspecified 09/15/2016  . Right upper quadrant abdominal pain   . Encounter for palliative care   . Depression 09/08/2016  . Abnormal liver function 09/08/2016  . Abdominal pain 09/08/2016  . Diarrhea 09/08/2016  . Speech impairment 03/09/2016  . Encounter for chemotherapy management 11/10/2015  . Bone metastasis ([place]) 11/02/2015  . Morbid obesity ([place]) 08/28/2015  . Shortness of breath 08/04/2015  . Dyspnea 08/04/2015  . Tachycardia 08/04/2015  . Hyperventilation 08/04/2015  .  Respiratory rate increased 08/04/2015  . Chemotherapy induced nausea and vomiting 07/13/2015  . Nausea without vomiting 06/08/2015  . Special screening for malignant neoplasms, colon 06/08/2015  . Bloating 06/08/2015  . Early satiety 06/08/2015  . Pain in the abdomen 06/04/2015  . Generalized anxiety disorder 05/21/2015  . Fatigue 05/21/2015  . Sleep apnea 05/21/2015  . Headache 10/22/2014  . Lymphedema of upper extremity 09/11/2014  . Status post prophylactic bilateral salpingo-oophorectomy for BRCA 03/19/2012  . Breast cancer of lower-outer quadrant of left female breast ([place]) 05/18/2011    Palliative Care Assessment & Plan   Patient Profile:   Assessment:  metastatic breast cancer Abdominal pain Pancreatitis S/P ERCP and stenting  Recommendations/Plan:   7 mg IV Dilaudid used in past 24 hours, continue prn use, continue trans dermal fentanyl use.   Bowel regimen in place, X ray with moderate stool in colon. Continue to monitor  Patient will attempt full liquid tray for supper tonight, continue to monitor, denies nausea currently.    Code Status:    Code Status Orders        Start     Ordered   09/08/16 2353  Do not attempt resuscitation (DNR)  Continuous    Question Answer Comment  In the event of cardiac or respiratory ARREST Do not call a "code blue"   In the event of cardiac or respiratory ARREST Do not perform Intubation, CPR, defibrillation or ACLS   In the event of cardiac or respiratory ARREST Use medication by any route, position, wound care,  and other measures to relive pain and suffering. May use oxygen, suction and manual treatment of airway obstruction as needed for comfort.      09/08/16 2353    Code Status History    Date Active Date Inactive Code Status Order ID Comments User Context   09/08/2016  8:55 PM 09/08/2016 11:53 PM DNR [id number]  Lorayne Bender, PA-C ED    Advance Directive Documentation   Flowsheet Row Most Recent Value  Type of  Advance Directive  Healthcare Power of Attorney  Pre-existing out of facility DNR order (yellow form or pink MOST form)  Yellow form placed in chart (order not valid for inpatient use)  "MOST" Form in Place?  No data       Prognosis:   Unable to determine  Discharge Planning:  To Be Determined  Care plan was discussed with  Patient   Thank you for allowing the Palliative Medicine Team to assist in the care of this patient.   Time In: 1500 Time Out: 1525 Total Time 25 Prolonged Time Billed  no       Greater than 50%  of this time was spent counseling and coordinating care related to the above assessment and plan.  Loistine Chance, MD [phone]  Please contact Palliative Medicine Team phone at [phone] for questions and concerns.

## 2016-09-16 DIAGNOSIS — R1013 Epigastric pain: Secondary | ICD-10-CM

## 2016-09-16 DIAGNOSIS — K859 Acute pancreatitis without necrosis or infection, unspecified: Secondary | ICD-10-CM

## 2016-09-16 DIAGNOSIS — K831 Obstruction of bile duct: Secondary | ICD-10-CM

## 2016-09-16 DIAGNOSIS — K5900 Constipation, unspecified: Secondary | ICD-10-CM

## 2016-09-16 LAB — COMPREHENSIVE METABOLIC PANEL
ALBUMIN: 3.1 g/dL — AB (ref 3.5–5.0)
ALK PHOS: 177 U/L — AB (ref 38–126)
ALT: 178 U/L — ABNORMAL HIGH (ref 14–54)
ANION GAP: 8 (ref 5–15)
AST: 140 U/L — ABNORMAL HIGH (ref 15–41)
BILIRUBIN TOTAL: 7.5 mg/dL — AB (ref 0.3–1.2)
BUN: 6 mg/dL (ref 6–20)
CALCIUM: 8.9 mg/dL (ref 8.9–10.3)
CO2: 26 mmol/L (ref 22–32)
Chloride: 102 mmol/L (ref 101–111)
Creatinine, Ser: 0.4 mg/dL — ABNORMAL LOW (ref 0.44–1.00)
GLUCOSE: 111 mg/dL — AB (ref 65–99)
POTASSIUM: 3.8 mmol/L (ref 3.5–5.1)
Sodium: 136 mmol/L (ref 135–145)
TOTAL PROTEIN: 6.4 g/dL — AB (ref 6.5–8.1)

## 2016-09-16 LAB — CBC WITH DIFFERENTIAL/PLATELET
BASOS ABS: 0 10*3/uL (ref 0.0–0.1)
BASOS PCT: 0 %
EOS ABS: 0.1 10*3/uL (ref 0.0–0.7)
Eosinophils Relative: 1 %
HEMATOCRIT: 33.6 % — AB (ref 36.0–46.0)
HEMOGLOBIN: 11.5 g/dL — AB (ref 12.0–15.0)
Lymphocytes Relative: 12 %
Lymphs Abs: 0.6 10*3/uL — ABNORMAL LOW (ref 0.7–4.0)
MCH: 32.8 pg (ref 26.0–34.0)
MCHC: 34.2 g/dL (ref 30.0–36.0)
MCV: 95.7 fL (ref 78.0–100.0)
MONOS PCT: 12 %
Monocytes Absolute: 0.6 10*3/uL (ref 0.1–1.0)
NEUTROS ABS: 4.1 10*3/uL (ref 1.7–7.7)
NEUTROS PCT: 75 %
Platelets: 171 10*3/uL (ref 150–400)
RBC: 3.51 MIL/uL — AB (ref 3.87–5.11)
RDW: 14.9 % (ref 11.5–15.5)
WBC: 5.4 10*3/uL (ref 4.0–10.5)

## 2016-09-16 LAB — LIPASE, BLOOD: LIPASE: 362 U/L — AB (ref 11–51)

## 2016-09-16 MED ORDER — FENTANYL 50 MCG/HR TD PT72
50.0000 ug | MEDICATED_PATCH | TRANSDERMAL | Status: DC
  Administered 2016-09-16: 50 ug via TRANSDERMAL
  Filled 2016-09-16: qty 1

## 2016-09-16 MED ORDER — BISACODYL 10 MG RE SUPP
10.0000 mg | Freq: Once | RECTAL | Status: AC
  Administered 2016-09-16: 10 mg via RECTAL
  Filled 2016-09-16: qty 1

## 2016-09-16 NOTE — Progress Notes (Signed)
PROGRESS NOTE    Tina Vaughan  [id number] DOB: 1961-07-05 DOA: 09/08/2016  PCP: Mackie Pai, PA-C   Brief Narrative:  55 y.o.femalewith medical history significant of metastatic breast cancer (s/p of surgery and currently on Ibrance and letrozole), GERD, depression, anxiety, OSA on CPAP, edema, who presents with abdominal pain.  Pt states that she has been having abdominal pain in the past 5 days, which is new. Her abdominal pain is located in the right upper quadrant, constant, 5 out of 10 in severity, radiating to the right back. She also hasnausea, but no vomiting. She has been having diarrhea in the past 4 days. She states that she was on stool softener, but she continues to have diarrhea after she stopped stool softer 3 days ago. She has 5-7 bowel movementswith loose stool each day. No fever or chills. Patient denies symptoms of UTI. She does not have chest pain, shortness of breath, cough, unilateral weakness   Subjective: Pain is controlled. Back pain resolved. No nausea. No BMs.   Assessment & Plan:  Abdominal pain and Abnormal liver function: -Presenting ALP 101, AST 110, ALT 311 and total bilirubin 5.1. -CT abd with fatty liver and periportal adenopathy  - Abd [name] with fatty liver   - MRCP demonstrated suspicion for CBD obstruction -  ERCP shows biliary stricture suspected to be from external compression (from lymph nodes) which has been stented -LFT's up - recheck tomorrow  Acute pancreatitis - due to ERCP-   -  Continue full liquids per GI  Breast cancer of lower-outer quadrant of left female breast [facility]):  -Patient is s/p mastectomy -Seen by Dr. Lindi Adie -Patient is continued on Ibrance and letrozole  Depression and anxiety: -Mood seems stable at present -Will continue Wellbutrin, Celexa and Ativan as tolerated  Recent Diarrhea/ grey stools -No further diarrhea noted  Positive urinalysis:  -Initial UA was suggestive of possible UTI -Patient is  now s/p fosfomycin  -Urine culture with multiple species present -Afebrile  Fatty liver - noted on imaging    DVT prophylaxis: Lovenox Code Status: DNR Family Communication:  Disposition Plan: home when stable Consultants:   GI Procedures:   ERCP 11/1 Antimicrobials:  Anti-infectives    Start     Dose/Rate Route Frequency Ordered Stop   09/13/16 1145  ciprofloxacin (CIPRO) IVPB 400 mg     400 mg 200 mL/hr over 60 Minutes Intravenous  Once 09/13/16 1137 09/13/16 1154       Objective: Vitals:   09/15/16 1357 09/15/16 2111 09/15/16 2154 09/16/16 0458  BP: 128/80  (!) 132/92 125/78  Pulse: (!) 105  (!) 121 (!) 115  Resp: 16 18 18 18   Temp: 98.8 F (37.1 C)  98.4 F (36.9 C) 99.4 F (37.4 C)  TempSrc: Oral  Oral Oral  SpO2: 91%  97% 91%  Weight:      Height:        Intake/Output Summary (Last 24 hours) at 09/16/16 1255 Last data filed at 09/16/16 0926  Gross per 24 hour  Intake              595 ml  Output             3100 ml  Net            -2505 ml   Filed Weights   09/08/16 1843 09/09/16 0127  Weight: 98.9 kg (218 lb) 96.7 kg (213 lb 3 oz)    Examination: General exam: Appears comfortable  HEENT: PERRLA, oral  mucosa moist, no sclera icterus or thrush Respiratory system: Clear to auscultation. Respiratory effort normal. Cardiovascular system: S1 & S2 heard, RRR.  No murmurs  Gastrointestinal system: Abdomen soft,  Tender in epigastrium, nondistended. Normal bowel sound. No organomegaly Central nervous system: Alert and oriented. No focal neurological deficits. Extremities: No cyanosis, clubbing or edema Skin: No rashes or ulcers Psychiatry:  Mood & affect appropriate.     Data Reviewed: I have personally reviewed following labs and imaging studies  CBC:  Recent Labs Lab 09/10/16 0415 09/10/16 0840 09/12/16 0540 09/14/16 0503 09/16/16 0500  WBC 2.1* 2.0* 2.2* 4.6 5.4  NEUTROABS  --   --   --  3.7 4.1  HGB 11.6* 11.4* 11.8* 13.0 11.5*    HCT 33.8* 32.8* 33.5* 37.7 33.6*  MCV 94.2 92.1 93.6 94.0 95.7  PLT 146* 140* 135* 153 [id number]   Basic Metabolic Panel:  Recent Labs Lab 09/12/16 0540 09/13/16 0600 09/14/16 0503 09/15/16 0625 09/16/16 0500  NA 138 139 137 136 136  K 3.5 3.6 3.7 3.8 3.8  CL 104 106 104 105 102  CO2 26 27 25 25 26   GLUCOSE 122* 112* 121* 106* 111*  BUN <5* <5* 7 6 6   CREATININE 0.44 0.47 0.37* 0.49 0.40*  CALCIUM 8.9 8.8* 8.5* 8.8* 8.9   GFR: Estimated Creatinine Clearance: 87.2 mL/min (by C-G formula based on SCr of 0.4 mg/dL (L)). Liver Function Tests:  Recent Labs Lab 09/12/16 0540 09/13/16 0600 09/14/16 0503 09/15/16 0625 09/16/16 0500  AST 131* 139* 125* 73* 140*  ALT 230* 228* 228* 167* 178*  ALKPHOS 102 104 125 138* 177*  BILITOT 6.3* 6.7* 7.6* 4.2* 7.5*  PROT 6.7 6.3* 7.0 7.1 6.4*  ALBUMIN 3.6 3.5 3.8 3.7 3.1*    Recent Labs Lab 09/14/16 0503 09/15/16 0625 09/16/16 0500  LIPASE 3,006* 1,326* 362*   No results for input(s): AMMONIA in the last 168 hours. Coagulation Profile: No results for input(s): INR, PROTIME in the last 168 hours. Cardiac Enzymes: No results for input(s): CKTOTAL, CKMB, CKMBINDEX, TROPONINI in the last 168 hours. BNP (last 3 results) No results for input(s): PROBNP in the last 8760 hours. HbA1C: No results for input(s): HGBA1C in the last 72 hours. CBG:  Recent Labs Lab 09/11/16 0751 09/12/16 0743 09/13/16 0755 09/14/16 0749 09/15/16 0805  GLUCAP 151* 111* 124* 104* 106*   Lipid Profile: No results for input(s): CHOL, HDL, LDLCALC, TRIG, CHOLHDL, LDLDIRECT in the last 72 hours. Thyroid Function Tests: No results for input(s): TSH, T4TOTAL, FREET4, T3FREE, THYROIDAB in the last 72 hours. Anemia Panel: No results for input(s): VITAMINB12, FOLATE, FERRITIN, TIBC, IRON, RETICCTPCT in the last 72 hours. Urine analysis:    Component Value Date/Time   COLORURINE ORANGE (A) 09/08/2016 2000   APPEARANCEUR CLEAR 09/08/2016 2000   LABSPEC  1.019 09/08/2016 2000   LABSPEC 1.015 01/18/2011 1144   PHURINE 6.5 09/08/2016 2000   GLUCOSEU NEGATIVE 09/08/2016 2000   HGBUR TRACE (A) 09/08/2016 2000   BILIRUBINUR LARGE (A) 09/08/2016 2000   BILIRUBINUR 1 06/04/2015 1122   BILIRUBINUR Negative 01/18/2011 [place] 09/08/2016 2000   PROTEINUR NEGATIVE 09/08/2016 2000   UROBILINOGEN 0.2 06/04/2015 2235   NITRITE POSITIVE (A) 09/08/2016 2000   LEUKOCYTESUR SMALL (A) 09/08/2016 2000   LEUKOCYTESUR Trace 01/18/2011 1144   Sepsis Labs: @LABRCNTIP (procalcitonin:4,lacticidven:4) ) Recent Results (from the past 240 hour(s))  Urine culture     Status: Abnormal   Collection Time: 09/08/16 11:51 PM  Result Value Ref  Range Status   Specimen Description URINE, CLEAN CATCH  Final   Special Requests NONE  Final   Culture MULTIPLE SPECIES PRESENT, SUGGEST RECOLLECTION (A)  Final   Report Status 09/10/2016 FINAL  Final         Radiology Studies: Dg Abd 2 Views  Result Date: 09/15/2016 CLINICAL DATA:  Displacement of biliary stent. EXAM: ABDOMEN - 2 VIEW COMPARISON:  ERCP [id number] FINDINGS: Metallic biliary stent is seen in the right upper quadrant which appears to be in stable position when compared to prior he ERCP. There is pneumobilia noted within the liver. Nonobstructive bowel gas pattern with moderate gas in stool within the colon. Calcified fibroids in the pelvis. No free air. IMPRESSION: Metallic biliary stent appears to be in stable position when compared to prior ERCP. Pneumobilia. Moderate stool in the colon. Electronically Signed   By: Rolm Baptise M.D.   On: 09/15/2016 10:21   Dg Swallowing Func-speech Pathology  Result Date: 09/15/2016 Objective Swallowing Evaluation: Type of Study: MBS-Modified Barium Swallow Study Patient Details Name: Tina Vaughan MRN: [id number] Date of Birth: December 05, 1960 Today's Date: 09/15/2016 Time: SLP Start Time (ACUTE ONLY): 1045-SLP Stop Time (ACUTE ONLY): 1100 SLP Time Calculation  (min) (ACUTE ONLY): 15 min Past Medical History: Past Medical History: Diagnosis Date . Anxiety  . Breast cancer, stage 3 ([place]) 2012/ 05/2015  left; inflammatory . Depression  . Eczema  . Frequent urination  . GERD (gastroesophageal reflux disease)  . Headache  . Lymphedema  . Neuromuscular disorder ([place])  . 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 Obesity  . Sleep apnea  . Thyroid disease   Possible would like to be evaluated Past Surgical History: Past Surgical History: Procedure Laterality Date . ERCP N/A 09/13/2016  Procedure: ENDOSCOPIC RETROGRADE CHOLANGIOPANCREATOGRAPHY (ERCP);  Surgeon: Clarene Essex, MD;  Location: [facility];  Service: Endoscopy;  Laterality: N/A; . LAPAROSCOPY  03/14/2012  Procedure: LAPAROSCOPY OPERATIVE;  Surgeon: Osborne Oman, MD;  Location: [facility];  Service: Gynecology;  Laterality: N/A; . MASTECTOMY Bilateral 05/12/11  total mastectomy on right, radical mastectomy on left . OOPHORECTOMY   . PORT-A-CATH REMOVAL  04/19/2012  Procedure: MINOR REMOVAL PORT-A-CATH;  Surgeon: Haywood Lasso, MD;  Location: [facility];  Service: General;  Laterality: N/A; . Portacath    Placement  . PORTACATH PLACEMENT   . SALPINGOOPHORECTOMY  03/14/2012  Procedure: SALPINGO OOPHERECTOMY;  Surgeon: Osborne Oman, MD;  Location: [facility];  Service: Gynecology;  Laterality: Bilateral; HPI: Pt is a 55 y.o. female with PMH of metastatic breast cancer, GERD, depression, anxiety, OSA on CPAP, edema, who presented with abdominal pain on 10/28. Chest CT on 11/1 indicated RLL and posterior RUL airspace and ground-glass opacities. MD with concern for aspiration and ordered MBS to rule out. No Data Recorded Assessment / Plan / Recommendation CHL IP CLINICAL IMPRESSIONS 09/15/2016 Therapy Diagnosis WFL Clinical Impression Pt with normal swallow function. Swallow is timely. No penetration or aspiration noted, no significant residuals post-swallow. Will defer diet recommendation to MD as pt is  currently on full liquid; pt had no oropharyngeal difficulty with regular solid. Will sign off at this time; please re-consult if needs arise.  Impact on safety and function Mild aspiration risk   CHL IP TREATMENT RECOMMENDATION 09/15/2016 Treatment Recommendations No treatment recommended at this time   No flowsheet data found. CHL IP DIET RECOMMENDATION 09/15/2016 SLP Diet Recommendations Other (Comment) Liquid Administration via Cup;Straw Medication Administration Whole meds with liquid Compensations --  Postural Changes Seated upright at 90 degrees   CHL IP OTHER RECOMMENDATIONS 09/15/2016 Recommended Consults -- Oral Care Recommendations Oral care BID Other Recommendations --   CHL IP FOLLOW UP RECOMMENDATIONS 09/15/2016 Follow up Recommendations None   No flowsheet data found.     CHL IP ORAL PHASE 09/15/2016 Oral Phase WFL Oral - Pudding Teaspoon -- Oral - Pudding Cup -- Oral - Honey Teaspoon -- Oral - Honey Cup -- Oral - Nectar Teaspoon -- Oral - Nectar Cup -- Oral - Nectar Straw -- Oral - Thin Teaspoon -- Oral - Thin Cup -- Oral - Thin Straw -- Oral - Puree -- Oral - Mech Soft -- Oral - Regular -- Oral - Multi-Consistency -- Oral - Pill -- Oral Phase - Comment --  CHL IP PHARYNGEAL PHASE 09/15/2016 Pharyngeal Phase WFL Pharyngeal- Pudding Teaspoon -- Pharyngeal -- Pharyngeal- Pudding Cup -- Pharyngeal -- Pharyngeal- Honey Teaspoon -- Pharyngeal -- Pharyngeal- Honey Cup -- Pharyngeal -- Pharyngeal- Nectar Teaspoon -- Pharyngeal -- Pharyngeal- Nectar Cup -- Pharyngeal -- Pharyngeal- Nectar Straw -- Pharyngeal -- Pharyngeal- Thin Teaspoon -- Pharyngeal -- Pharyngeal- Thin Cup -- Pharyngeal -- Pharyngeal- Thin Straw -- Pharyngeal -- Pharyngeal- Puree -- Pharyngeal -- Pharyngeal- Mechanical Soft -- Pharyngeal -- Pharyngeal- Regular -- Pharyngeal -- Pharyngeal- Multi-consistency -- Pharyngeal -- Pharyngeal- Pill -- Pharyngeal -- Pharyngeal Comment --  CHL IP CERVICAL ESOPHAGEAL PHASE 09/15/2016 Cervical Esophageal Phase  WFL Pudding Teaspoon -- Pudding Cup -- Honey Teaspoon -- Honey Cup -- Nectar Teaspoon -- Nectar Cup -- Nectar Straw -- Thin Teaspoon -- Thin Cup -- Thin Straw -- Puree -- Mechanical Soft -- Regular -- Multi-consistency -- Pill -- Cervical Esophageal Comment -- CHL IP GO 03/27/2016 Functional Assessment Tool Used (None) Functional Limitations Motor speech Swallow Current Status [phone]) (None) Swallow Goal Status [id number]) (None) Swallow Discharge Status [id number]) (None) Motor Speech Current Status [id number]) CJ Motor Speech Goal Status [id number]) CJ Motor Speech Goal Status [id number]) CJ Spoken Language Comprehension Current Status [id number]:[id number]) (None) Spoken Language Comprehension Goal Status [id number]) (None) Spoken Language Comprehension Discharge Status [id number]) (None) Spoken Language Expression Current Status [id number]) (None) Spoken Language Expression Goal Status [id number]) (None) Spoken Language Expression Discharge Status FB:275424) (None) Attention Current Status LV:671222) (None) Attention Goal Status FV:388293) (None) Attention Discharge Status [id number]:2303441) (None) Memory Current Status AE:130515) (None) Memory Goal Status GI:463060) (None) Memory Discharge Status [id number]:5226335) (None) Voice Current Status [id number]:3169984) (None) Voice Goal Status [id number]) (None) Voice Discharge Status [id number]) (None) Other Speech-Language Pathology Functional Limitation [id number]) (None) Other Speech-Language Pathology Functional Limitation Goal Status [id number]) (None) Other Speech-Language Pathology Functional Limitation Discharge Status [id number]) (None) Kern Reap, MA, CCC-SLP 09/15/2016, 11:24 AM [id number]                Scheduled Meds: . bisacodyl  10 mg Rectal Once  . buPROPion  300 mg Oral Daily  . calcium carbonate  500 mg of elemental calcium Oral Daily  . cholecalciferol  1,000 Units Oral Daily  . citalopram  10 mg Oral Daily  . enoxaparin (LOVENOX) injection  40 mg Subcutaneous Q24H  . fentaNYL  50 mcg Transdermal Q72H  . gabapentin  300 mg Oral TID  . letrozole  2.5 mg  Oral Daily  . LORazepam  1 mg Oral Daily  . LORazepam  2 mg Oral QHS  . nystatin  5 mL Oral QID  . ondansetron (ZOFRAN) IV  4 mg Intravenous Q6H  . pantoprazole  40 mg Oral Daily  . polyethylene glycol  17 g Oral BID  .  senna  2 tablet Oral QHS  . zolpidem  5 mg Oral QHS   Continuous Infusions: . sodium chloride 100 mL/hr at 09/15/16 0752     LOS: 7 days    Time spent in minutes: 40 min    Janiel Derhammer, MD [facility] Pager: [email or web address] Password [email or web address] 09/16/2016, 12:55 PM

## 2016-09-16 NOTE — Progress Notes (Signed)
Daily Progress Note   Patient Name: Tina Vaughan       Date: 09/16/2016 DOB: 1961/10/22  Age: 55 y.o. MRN#: [id number] Attending Physician: Debbe Odea, MD Primary Care Physician: Elise Benne Admit Date: 09/08/2016  Reason for Consultation/Follow-up: pain management   Subjective:  awake alert rested well overnight  Resting in bed Tolerating diet Abdominal pain some what improved, still with mild to moderate epigastric abdominal pain.  Lipase levels decreased, LFTs are also being followed by primary team and GI  See below   Length of Stay: 7  Current Medications: Scheduled Meds:  . bisacodyl  10 mg Rectal Once  . buPROPion  300 mg Oral Daily  . calcium carbonate  500 mg of elemental calcium Oral Daily  . cholecalciferol  1,000 Units Oral Daily  . citalopram  10 mg Oral Daily  . enoxaparin (LOVENOX) injection  40 mg Subcutaneous Q24H  . fentaNYL  50 mcg Transdermal Q72H  . gabapentin  300 mg Oral TID  . letrozole  2.5 mg Oral Daily  . LORazepam  1 mg Oral Daily  . LORazepam  2 mg Oral QHS  . nystatin  5 mL Oral QID  . ondansetron (ZOFRAN) IV  4 mg Intravenous Q6H  . pantoprazole  40 mg Oral Daily  . polyethylene glycol  17 g Oral BID  . senna  2 tablet Oral QHS  . zolpidem  5 mg Oral QHS    Continuous Infusions: . sodium chloride 100 mL/hr at 09/15/16 0752    PRN Meds: alum & mag hydroxide-simeth, fluocinonide cream, HYDROmorphone (DILAUDID) injection, ibuprofen, lidocaine-prilocaine, sodium chloride flush  Physical Exam         Awake alert NAD Abdominal distension mild epigastric pain.  Faint bowel sounds S1 S2 Clear  Vital Signs: BP 125/78 (BP Location: Right Arm)   Pulse (!) 115   Temp 99.4 F (37.4 C) (Oral)   Resp 18   Ht _0  (1.575 m)   Wt  96.7 kg (213 lb 3 oz)   LMP 05/08/2016   SpO2 91%   BMI 38.99 kg/m  SpO2: SpO2: 91 % O2 Device: O2 Device: Not Delivered O2 Flow Rate: O2 Flow Rate (L/min): 2 L/min  Intake/output summary:   Intake/Output Summary (Last 24 hours) at 09/16/16 1051 Last data filed at 09/16/16  0926  Gross per 24 hour  Intake              595 ml  Output             3100 ml  Net            -2505 ml   LBM: Last BM Date: 09/11/16 Baseline Weight: Weight: 98.9 kg (218 lb) Most recent weight: Weight: 96.7 kg (213 lb 3 oz)       Palliative Assessment/Data:    Flowsheet Rows   Flowsheet Row Most Recent Value  Intake Tab  Referral Department  Hospitalist  Unit at Time of Referral  Oncology Unit  Palliative Care Primary Diagnosis  Cancer  Palliative Care Type  Return patient Palliative Care  Reason for referral  Pain  Date first seen by Palliative Care  09/14/16  Clinical Assessment  Palliative Performance Scale Score  40%  Pain Max last 24 hours  7  Pain Min Last 24 hours  4  Dyspnea Max Last 24 Hours  4  Dyspnea Min Last 24 hours  3  Nausea Max Last 24 Hours  3  Nausea Min Last 24 Hours  2  Anxiety Max Last 24 Hours  3  Psychosocial & Spiritual Assessment  Palliative Care Outcomes  Patient/Family meeting held?  Yes  Who was at the meeting?  patient who is decisional, sister was not in the room today   Palliative Care Outcomes  Improved pain interventions  Palliative Care follow-up planned  Yes, Facility      Patient Active Problem List   Diagnosis Date Noted  . 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 Acute pancreatitis without necrosis or infection, unspecified 09/15/2016  . Right upper quadrant abdominal pain   . Encounter for palliative care   . Depression 09/08/2016  . Abnormal liver function 09/08/2016  . Abdominal pain 09/08/2016  . Diarrhea 09/08/2016  . Speech impairment 03/09/2016  . Encounter for chemotherapy management 11/10/2015  . Bone metastasis ([place])  11/02/2015  . Morbid obesity ([place]) 08/28/2015  . Shortness of breath 08/04/2015  . Dyspnea 08/04/2015  . Tachycardia 08/04/2015  . Hyperventilation 08/04/2015  . Respiratory rate increased 08/04/2015  . Chemotherapy induced nausea and vomiting 07/13/2015  . Nausea without vomiting 06/08/2015  . Special screening for malignant neoplasms, colon 06/08/2015  . Bloating 06/08/2015  . Early satiety 06/08/2015  . Pain in the abdomen 06/04/2015  . Generalized anxiety disorder 05/21/2015  . Fatigue 05/21/2015  . Sleep apnea 05/21/2015  . Headache 10/22/2014  . Lymphedema of upper extremity 09/11/2014  . Status post prophylactic bilateral salpingo-oophorectomy for BRCA 03/19/2012  . Breast cancer of lower-outer quadrant of left female breast ([place]) 05/18/2011    Palliative Care Assessment & Plan   Patient Profile:   Assessment:  metastatic breast cancer Abdominal pain Pancreatitis S/P ERCP and stenting  Recommendations/Plan:   10-12 mg IV Dilaudid used in past 24 hours, continue prn use, continue trans dermal fentanyl use.   Bowel regimen in place, X ray with moderate stool in colon. Continue to monitor, ? Needs Dulcolax suppository by this evening.   Continue prn zofran use, continue to monitor, denies nausea currently.    Code Status:    Code Status Orders        Start     Ordered   09/08/16 2353  Do not attempt resuscitation (DNR)  Continuous    Question Answer Comment  In the event of cardiac or respiratory ARREST Do not  call a "code blue"   In the event of cardiac or respiratory ARREST Do not perform Intubation, CPR, defibrillation or ACLS   In the event of cardiac or respiratory ARREST Use medication by any route, position, wound care, and other measures to relive pain and suffering. May use oxygen, suction and manual treatment of airway obstruction as needed for comfort.      09/08/16 2353    Code Status History    Date Active Date Inactive Code Status Order ID  Comments User Context   09/08/2016  8:55 PM 09/08/2016 11:53 PM DNR [id number]  Lorayne Bender, PA-C ED    Advance Directive Documentation   Flowsheet Row Most Recent Value  Type of Advance Directive  Healthcare Power of Attorney  Pre-existing out of facility DNR order (yellow form or pink MOST form)  Yellow form placed in chart (order not valid for inpatient use)  "MOST" Form in Place?  No data       Prognosis:   Unable to determine  Discharge Planning:  To Be Determined  Care plan was discussed with  Patient   Thank you for allowing the Palliative Medicine Team to assist in the care of this patient.   Time In: 10 Time Out: 1025 Total Time 25 Prolonged Time Billed  no       Greater than 50%  of this time was spent counseling and coordinating care related to the above assessment and plan.  Loistine Chance, MD [phone]  Please contact Palliative Medicine Team phone at [phone] for questions and concerns.

## 2016-09-16 NOTE — Progress Notes (Signed)
[facility] Gastroenterology Progress Note  Tina Vaughan 55 y.o. 1961/02/10   Subjective: Having epigastric pain with nausea. Tolerating full liquids.  Objective: Vital signs in last 24 hours: Vitals:   09/15/16 2154 09/16/16 0458  BP: (!) 132/92 125/78  Pulse: (!) 121 (!) 115  Resp: 18 18  Temp: 98.4 F (36.9 C) 99.4 F (37.4 C)    Physical Exam: Gen: alert, no acute distress CV: RRR Chest: CTA B Abd: epigastric tenderness with guarding, soft, nondistended, +BS Ext: no edema  Lab Results:  Recent Labs  09/15/16 0625 09/16/16 0500  NA 136 136  K 3.8 3.8  CL 105 102  CO2 25 26  GLUCOSE 106* 111*  BUN 6 6  CREATININE 0.49 0.40*  CALCIUM 8.8* 8.9    Recent Labs  09/15/16 0625 09/16/16 0500  AST 73* 140*  ALT 167* 178*  ALKPHOS 138* 177*  BILITOT 4.2* 7.5*  PROT 7.1 6.4*  ALBUMIN 3.7 3.1*    Recent Labs  09/14/16 0503 09/16/16 0500  WBC 4.6 5.4  NEUTROABS 3.7 4.1  HGB 13.0 11.5*  HCT 37.7 33.6*  MCV 94.0 95.7  PLT 153 171   No results for input(s): LABPROT, INR in the last 72 hours.    Assessment/Plan: Jaundice s/p biliary stent with pancreatitis - Mild increase in LFTs. Lipase improving. Continue supportive care. Do not advance diet today due to resolving pancreatitis. Continue full liquid diet.   Wharton C. 09/16/2016, 11:08 AM  Pager [phone]  If no answer or after 5 PM call [phone] ID: Tina Vaughan, female   DOB: [birth year], 55 y.o.   MRN: [id number]

## 2016-09-17 LAB — COMPREHENSIVE METABOLIC PANEL
ALK PHOS: 178 U/L — AB (ref 38–126)
ALT: 194 U/L — ABNORMAL HIGH (ref 14–54)
AST: 149 U/L — AB (ref 15–41)
Albumin: 3.1 g/dL — ABNORMAL LOW (ref 3.5–5.0)
Anion gap: 8 (ref 5–15)
BILIRUBIN TOTAL: 7.9 mg/dL — AB (ref 0.3–1.2)
CALCIUM: 8.9 mg/dL (ref 8.9–10.3)
CO2: 26 mmol/L (ref 22–32)
CREATININE: 0.41 mg/dL — AB (ref 0.44–1.00)
Chloride: 104 mmol/L (ref 101–111)
GFR calc Af Amer: >60 mL/min (ref >60)
Glucose, Bld: 109 mg/dL — ABNORMAL HIGH (ref 65–99)
POTASSIUM: 3.6 mmol/L (ref 3.5–5.1)
Sodium: 138 mmol/L (ref 135–145)
TOTAL PROTEIN: 6.4 g/dL — AB (ref 6.5–8.1)

## 2016-09-17 NOTE — Progress Notes (Signed)
[name] Progress Note  [name] 55 y.o. 1961-07-31   Subjective: Complaining of epigastric pain  Objective: Vital signs in last 24 hours: Vitals:   09/16/16 2130 09/17/16 0600  BP: 121/86 (!) 131/94  Pulse: (!) 117 (!) 106  Resp: 18 18  Temp: 98.8 F (37.1 C) 98.4 F (36.9 C)    Physical Exam: Gen: alert, mild acute distress CV: RRR Chest: CTA B Abd: epigastric tenderness with guarding, soft, nondistended, +BS Ext: no edema  Lab Results:  Recent Labs  09/16/16 0500 09/17/16 0555  NA 136 138  K 3.8 3.6  CL 102 104  CO2 26 26  GLUCOSE 111* 109*  BUN 6 <5*  CREATININE 0.40* 0.41*  CALCIUM 8.9 8.9    Recent Labs  09/16/16 0500 09/17/16 0555  AST 140* 149*  ALT 178* 194*  ALKPHOS 177* 178*  BILITOT 7.5* 7.9*  PROT 6.4* 6.4*  ALBUMIN 3.1* 3.1*    Recent Labs  09/16/16 0500  WBC 5.4  NEUTROABS 4.1  HGB 11.5*  HCT 33.6*  MCV 95.7  PLT 171   No results for input(s): LABPROT, INR in the last 72 hours.    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 Dr. Watt Climes will do a biliary stent exchange tomorrow morning. NPO p MN. Supportive care.   Bowdle C. 09/17/2016, 11:03 AM  Pager [phone]  If no answer or after 5 PM call [phone] ID: Tina Vaughan, female   DOB: Feb 15, 1961, 55 y.o.   MRN: [id number]

## 2016-09-17 NOTE — Progress Notes (Signed)
PROGRESS NOTE    Tina Vaughan  [id number] DOB: 1961-06-09 DOA: 09/08/2016  PCP: Mackie Pai, PA-C   Brief Narrative:  55 y.o.femalewith medical history significant of metastatic breast cancer (s/p of surgery and currently on Ibrance and letrozole), GERD, depression, anxiety, OSA on CPAP, edema, who presents with abdominal pain.  Pt states that she has been having abdominal pain in the past 5 days, which is new. Her abdominal pain is located in the right upper quadrant, constant, 5 out of 10 in severity, radiating to the right back. She also hasnausea, but no vomiting. She has been having diarrhea in the past 4 days. She states that she was on stool softener, but she continues to have diarrhea after she stopped stool softer 3 days ago. She has 5-7 bowel movementswith loose stool each day. No fever or chills. Patient denies symptoms of UTI. She does not have chest pain, shortness of breath, cough, unilateral weakness   Subjective: Abdominal pain is controlled. No nausea.   Assessment & Plan:  Abdominal pain and Abnormal liver function: -Presenting ALP 101, AST 110, ALT 311 and total bilirubin 5.1. -CT abd with fatty liver and periportal adenopathy  - Abd [name] with fatty liver   - MRCP demonstrated suspicion for CBD obstruction -  ERCP shows biliary stricture suspected to be from external compression (from lymph nodes) which has been stented -LFT's up - repeat ERCP tomorrow  Acute pancreatitis - due to ERCP-   - improving -  Continue diet per GI  Breast cancer of lower-outer quadrant of left female breast [facility]):  -Patient is s/p mastectomy -Seen by Dr. Lindi Adie -Patient is continued on Ibrance and letrozole  Depression and anxiety: -Mood seems stable at present -Will continue Wellbutrin, Celexa and Ativan as tolerated  Recent Diarrhea/ grey stools -No further diarrhea noted  Positive urinalysis:  -Initial UA was suggestive of possible UTI -Patient is now s/p  fosfomycin  -Urine culture with multiple species present -Afebrile  Fatty liver - noted on imaging    DVT prophylaxis: Lovenox Code Status: DNR Family Communication:  Disposition Plan: home when stable Consultants:   GI Procedures:   ERCP 11/1 Antimicrobials:  Anti-infectives    Start     Dose/Rate Route Frequency Ordered Stop   09/13/16 1145  ciprofloxacin (CIPRO) IVPB 400 mg     400 mg 200 mL/hr over 60 Minutes Intravenous  Once 09/13/16 1137 09/13/16 1154       Objective: Vitals:   09/16/16 0458 09/16/16 1351 09/16/16 2130 09/17/16 0600  BP: 125/78 106/76 121/86 (!) 131/94  Pulse: (!) 115 (!) 115 (!) 117 (!) 106  Resp: 18 16 18 18   Temp: 99.4 F (37.4 C) 98.4 F (36.9 C) 98.8 F (37.1 C) 98.4 F (36.9 C)  TempSrc: Oral Oral Oral   SpO2: 91% 97% 96% 93%  Weight:      Height:        Intake/Output Summary (Last 24 hours) at 09/17/16 1105 Last data filed at 09/17/16 0920  Gross per 24 hour  Intake           1835.3 ml  Output             2125 ml  Net           -289.7 ml   Filed Weights   09/08/16 1843 09/09/16 0127  Weight: 98.9 kg (218 lb) 96.7 kg (213 lb 3 oz)    Examination: General exam: Appears comfortable  HEENT: PERRLA, oral mucosa moist,  no sclera icterus or thrush Respiratory system: Clear to auscultation. Respiratory effort normal. Cardiovascular system: S1 & S2 heard, RRR.  No murmurs  Gastrointestinal system: Abdomen soft,  Tender in epigastrium, nondistended. Normal bowel sound. No organomegaly Central nervous system: Alert and oriented. No focal neurological deficits. Extremities: No cyanosis, clubbing or edema Skin: No rashes or ulcers Psychiatry:  Mood & affect appropriate.     Data Reviewed: I have personally reviewed following labs and imaging studies  CBC:  Recent Labs Lab 09/12/16 0540 09/14/16 0503 09/16/16 0500  WBC 2.2* 4.6 5.4  NEUTROABS  --  3.7 4.1  HGB 11.8* 13.0 11.5*  HCT 33.5* 37.7 33.6*  MCV 93.6 94.0  95.7  PLT 135* 153 [id number]   Basic Metabolic Panel:  Recent Labs Lab 09/13/16 0600 09/14/16 0503 09/15/16 0625 09/16/16 0500 09/17/16 0555  NA 139 137 136 136 138  K 3.6 3.7 3.8 3.8 3.6  CL 106 104 105 102 104  CO2 27 25 25 26 26   GLUCOSE 112* 121* 106* 111* 109*  BUN <5* 7 6 6  <5*  CREATININE 0.47 0.37* 0.49 0.40* 0.41*  CALCIUM 8.8* 8.5* 8.8* 8.9 8.9   GFR: Estimated Creatinine Clearance: 87.2 mL/min (by C-G formula based on SCr of 0.41 mg/dL (L)). Liver Function Tests:  Recent Labs Lab 09/13/16 0600 09/14/16 0503 09/15/16 0625 09/16/16 0500 09/17/16 0555  AST 139* 125* 73* 140* 149*  ALT 228* 228* 167* 178* 194*  ALKPHOS 104 125 138* 177* 178*  BILITOT 6.7* 7.6* 4.2* 7.5* 7.9*  PROT 6.3* 7.0 7.1 6.4* 6.4*  ALBUMIN 3.5 3.8 3.7 3.1* 3.1*    Recent Labs Lab 09/14/16 0503 09/15/16 0625 09/16/16 0500  LIPASE 3,006* 1,326* 362*   No results for input(s): AMMONIA in the last 168 hours. Coagulation Profile: No results for input(s): INR, PROTIME in the last 168 hours. Cardiac Enzymes: No results for input(s): CKTOTAL, CKMB, CKMBINDEX, TROPONINI in the last 168 hours. BNP (last 3 results) No results for input(s): PROBNP in the last 8760 hours. HbA1C: No results for input(s): HGBA1C in the last 72 hours. CBG:  Recent Labs Lab 09/11/16 0751 09/12/16 0743 09/13/16 0755 09/14/16 0749 09/15/16 0805  GLUCAP 151* 111* 124* 104* 106*   Lipid Profile: No results for input(s): CHOL, HDL, LDLCALC, TRIG, CHOLHDL, LDLDIRECT in the last 72 hours. Thyroid Function Tests: No results for input(s): TSH, T4TOTAL, FREET4, T3FREE, THYROIDAB in the last 72 hours. Anemia Panel: No results for input(s): VITAMINB12, FOLATE, FERRITIN, TIBC, IRON, RETICCTPCT in the last 72 hours. Urine analysis:    Component Value Date/Time   COLORURINE ORANGE (A) 09/08/2016 2000   APPEARANCEUR CLEAR 09/08/2016 2000   LABSPEC 1.019 09/08/2016 2000   LABSPEC 1.015 01/18/2011 1144   PHURINE  6.5 09/08/2016 2000   GLUCOSEU NEGATIVE 09/08/2016 2000   HGBUR TRACE (A) 09/08/2016 2000   BILIRUBINUR LARGE (A) 09/08/2016 2000   BILIRUBINUR 1 06/04/2015 1122   BILIRUBINUR Negative 01/18/2011 [place] 09/08/2016 2000   PROTEINUR NEGATIVE 09/08/2016 2000   UROBILINOGEN 0.2 06/04/2015 2235   NITRITE POSITIVE (A) 09/08/2016 2000   LEUKOCYTESUR SMALL (A) 09/08/2016 2000   LEUKOCYTESUR Trace 01/18/2011 1144   Sepsis Labs: @LABRCNTIP (procalcitonin:4,lacticidven:4) ) Recent Results (from the past 240 hour(s))  Urine culture     Status: Abnormal   Collection Time: 09/08/16 11:51 PM  Result Value Ref Range Status   Specimen Description URINE, CLEAN CATCH  Final   Special Requests NONE  Final   Culture MULTIPLE SPECIES  PRESENT, SUGGEST RECOLLECTION (A)  Final   Report Status 09/10/2016 FINAL  Final         Radiology Studies: Dg Swallowing Func-speech Pathology  Result Date: 09/15/2016 Objective Swallowing Evaluation: Type of Study: MBS-Modified Barium Swallow Study Patient Details Name: SIVAN EVERHART MRN: [id number] Date of Birth: Apr 16, 1961 Today's Date: 09/15/2016 Time: SLP Start Time (ACUTE ONLY): 1045-SLP Stop Time (ACUTE ONLY): 1100 SLP Time Calculation (min) (ACUTE ONLY): 15 min Past Medical History: Past Medical History: Diagnosis Date . Anxiety  . Breast cancer, stage 3 ([place]) 2012/ 05/2015  left; inflammatory . Depression  . Eczema  . Frequent urination  . GERD (gastroesophageal reflux disease)  . Headache  . Lymphedema  . Neuromuscular disorder ([place])  . 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 Obesity  . Sleep apnea  . Thyroid disease   Possible would like to be evaluated Past Surgical History: Past Surgical History: Procedure Laterality Date . ERCP N/A 09/13/2016  Procedure: ENDOSCOPIC RETROGRADE CHOLANGIOPANCREATOGRAPHY (ERCP);  Surgeon: Clarene Essex, MD;  Location: [facility];  Service: Endoscopy;  Laterality: N/A; . LAPAROSCOPY  03/14/2012  Procedure:  LAPAROSCOPY OPERATIVE;  Surgeon: Osborne Oman, MD;  Location: [facility];  Service: Gynecology;  Laterality: N/A; . MASTECTOMY Bilateral 05/12/11  total mastectomy on right, radical mastectomy on left . OOPHORECTOMY   . PORT-A-CATH REMOVAL  04/19/2012  Procedure: MINOR REMOVAL PORT-A-CATH;  Surgeon: Haywood Lasso, MD;  Location: [facility];  Service: General;  Laterality: N/A; . Portacath    Placement  . PORTACATH PLACEMENT   . SALPINGOOPHORECTOMY  03/14/2012  Procedure: SALPINGO OOPHERECTOMY;  Surgeon: Osborne Oman, MD;  Location: [facility];  Service: Gynecology;  Laterality: Bilateral; HPI: Pt is a 55 y.o. female with PMH of metastatic breast cancer, GERD, depression, anxiety, OSA on CPAP, edema, who presented with abdominal pain on 10/28. Chest CT on 11/1 indicated RLL and posterior RUL airspace and ground-glass opacities. MD with concern for aspiration and ordered MBS to rule out. No Data Recorded Assessment / Plan / Recommendation CHL IP CLINICAL IMPRESSIONS 09/15/2016 Therapy Diagnosis WFL Clinical Impression Pt with normal swallow function. Swallow is timely. No penetration or aspiration noted, no significant residuals post-swallow. Will defer diet recommendation to MD as pt is currently on full liquid; pt had no oropharyngeal difficulty with regular solid. Will sign off at this time; please re-consult if needs arise.  Impact on safety and function Mild aspiration risk   CHL IP TREATMENT RECOMMENDATION 09/15/2016 Treatment Recommendations No treatment recommended at this time   No flowsheet data found. CHL IP DIET RECOMMENDATION 09/15/2016 SLP Diet Recommendations Other (Comment) Liquid Administration via Cup;Straw Medication Administration Whole meds with liquid Compensations -- Postural Changes Seated upright at 90 degrees   CHL IP OTHER RECOMMENDATIONS 09/15/2016 Recommended Consults -- Oral Care Recommendations Oral care BID Other Recommendations --   CHL IP FOLLOW UP RECOMMENDATIONS 09/15/2016  Follow up Recommendations None   No flowsheet data found.     CHL IP ORAL PHASE 09/15/2016 Oral Phase WFL Oral - Pudding Teaspoon -- Oral - Pudding Cup -- Oral - Honey Teaspoon -- Oral - Honey Cup -- Oral - Nectar Teaspoon -- Oral - Nectar Cup -- Oral - Nectar Straw -- Oral - Thin Teaspoon -- Oral - Thin Cup -- Oral - Thin Straw -- Oral - Puree -- Oral - Mech Soft -- Oral - Regular -- Oral - Multi-Consistency -- Oral - Pill -- Oral Phase - Comment --  CHL  IP PHARYNGEAL PHASE 09/15/2016 Pharyngeal Phase WFL Pharyngeal- Pudding Teaspoon -- Pharyngeal -- Pharyngeal- Pudding Cup -- Pharyngeal -- Pharyngeal- Honey Teaspoon -- Pharyngeal -- Pharyngeal- Honey Cup -- Pharyngeal -- Pharyngeal- Nectar Teaspoon -- Pharyngeal -- Pharyngeal- Nectar Cup -- Pharyngeal -- Pharyngeal- Nectar Straw -- Pharyngeal -- Pharyngeal- Thin Teaspoon -- Pharyngeal -- Pharyngeal- Thin Cup -- Pharyngeal -- Pharyngeal- Thin Straw -- Pharyngeal -- Pharyngeal- Puree -- Pharyngeal -- Pharyngeal- Mechanical Soft -- Pharyngeal -- Pharyngeal- Regular -- Pharyngeal -- Pharyngeal- Multi-consistency -- Pharyngeal -- Pharyngeal- Pill -- Pharyngeal -- Pharyngeal Comment --  CHL IP CERVICAL ESOPHAGEAL PHASE 09/15/2016 Cervical Esophageal Phase WFL Pudding Teaspoon -- Pudding Cup -- Honey Teaspoon -- Honey Cup -- Nectar Teaspoon -- Nectar Cup -- Nectar Straw -- Thin Teaspoon -- Thin Cup -- Thin Straw -- Puree -- Mechanical Soft -- Regular -- Multi-consistency -- Pill -- Cervical Esophageal Comment -- CHL IP GO 03/27/2016 Functional Assessment Tool Used (None) Functional Limitations Motor speech Swallow Current Status [phone]) (None) Swallow Goal Status [id number]) (None) Swallow Discharge Status [id number]) (None) Motor Speech Current Status [id number]) CJ Motor Speech Goal Status UK:060616) CJ Motor Speech Goal Status SA:931536) CJ Spoken Language Comprehension Current Status [id number]) (None) Spoken Language Comprehension Goal Status [id number]) (None) Spoken Language Comprehension  Discharge Status [id number]) (None) Spoken Language Expression Current Status FP:837989) (None) Spoken Language Expression Goal Status [id number]) (None) Spoken Language Expression Discharge Status NF:1565649) (None) Attention Current Status [id number]) (None) Attention Goal Status EY:7266000) (None) Attention Discharge Status [id number]:4613913) (None) Memory Current Status YL:3545582) (None) Memory Goal Status CF:3682075) (None) Memory Discharge Status QC:115444) (None) Voice Current Status [id number]) (None) Voice Goal Status [id number]:8517110) (None) Voice Discharge Status [id number]) (None) Other Speech-Language Pathology Functional Limitation UC:978821) (None) Other Speech-Language Pathology Functional Limitation Goal Status [id number]) (None) Other Speech-Language Pathology Functional Limitation Discharge Status [phone]) (None) Kern Reap, MA, CCC-SLP 09/15/2016, 11:24 AM [id number]                Scheduled Meds: . buPROPion  300 mg Oral Daily  . calcium carbonate  500 mg of elemental calcium Oral Daily  . cholecalciferol  1,000 Units Oral Daily  . citalopram  10 mg Oral Daily  . enoxaparin (LOVENOX) injection  40 mg Subcutaneous Q24H  . fentaNYL  50 mcg Transdermal Q72H  . gabapentin  300 mg Oral TID  . letrozole  2.5 mg Oral Daily  . LORazepam  1 mg Oral Daily  . LORazepam  2 mg Oral QHS  . nystatin  5 mL Oral QID  . ondansetron (ZOFRAN) IV  4 mg Intravenous Q6H  . pantoprazole  40 mg Oral Daily  . polyethylene glycol  17 g Oral BID  . senna  2 tablet Oral QHS  . zolpidem  5 mg Oral QHS   Continuous Infusions: . sodium chloride 100 mL/hr at 09/16/16 2254     LOS: 8 days    Time spent in minutes: 54 min    Calayah Guadarrama, MD [facility] Pager: [email or web address] Password [email or web address] 09/17/2016, 11:05 AM

## 2016-09-18 ENCOUNTER — Inpatient Hospital Stay (HOSPITAL_COMMUNITY): Payer: Medicare Other | Admitting: Anesthesiology

## 2016-09-18 ENCOUNTER — Encounter (HOSPITAL_COMMUNITY): Payer: Self-pay

## 2016-09-18 ENCOUNTER — Encounter (HOSPITAL_COMMUNITY)
Admission: EM | Disposition: A | Discharge status: Home / Self Care | Payer: Self-pay | Source: Home / Self Care | Attending: Internal Medicine

## 2016-09-18 ENCOUNTER — Inpatient Hospital Stay (HOSPITAL_COMMUNITY): Payer: Medicare Other

## 2016-09-18 DIAGNOSIS — R17 Unspecified jaundice: Secondary | ICD-10-CM

## 2016-09-18 DIAGNOSIS — T85520A Displacement of bile duct prosthesis, initial encounter: Secondary | ICD-10-CM

## 2016-09-18 DIAGNOSIS — K5900 Constipation, unspecified: Secondary | ICD-10-CM

## 2016-09-18 HISTORY — PX: ERCP: SHX5425

## 2016-09-18 LAB — COMPREHENSIVE METABOLIC PANEL
ALK PHOS: 191 U/L — AB (ref 38–126)
ALT: 216 U/L — AB (ref 14–54)
ANION GAP: 7 (ref 5–15)
AST: 179 U/L — ABNORMAL HIGH (ref 15–41)
Albumin: 3.1 g/dL — ABNORMAL LOW (ref 3.5–5.0)
BILIRUBIN TOTAL: 7.7 mg/dL — AB (ref 0.3–1.2)
BUN: <5 mg/dL — ABNORMAL LOW (ref 6–20)
CALCIUM: 9.1 mg/dL (ref 8.9–10.3)
CO2: 27 mmol/L (ref 22–32)
CREATININE: 0.48 mg/dL (ref 0.44–1.00)
Chloride: 104 mmol/L (ref 101–111)
GFR calc non Af Amer: >60 mL/min (ref >60)
Glucose, Bld: 105 mg/dL — ABNORMAL HIGH (ref 65–99)
Potassium: 3.6 mmol/L (ref 3.5–5.1)
SODIUM: 138 mmol/L (ref 135–145)
TOTAL PROTEIN: 6.8 g/dL (ref 6.5–8.1)

## 2016-09-18 LAB — GLUCOSE, CAPILLARY
GLUCOSE-CAPILLARY: 105 mg/dL — AB (ref 65–99)
GLUCOSE-CAPILLARY: 103 mg/dL — AB (ref 65–99)
Glucose-Capillary: 99 mg/dL (ref 65–99)

## 2016-09-18 SURGERY — ERCP, WITH INTERVENTION IF INDICATED
Anesthesia: General

## 2016-09-18 MED ORDER — FENTANYL CITRATE (PF) 100 MCG/2ML IJ SOLN
INTRAMUSCULAR | Status: AC
  Filled 2016-09-18: qty 2

## 2016-09-18 MED ORDER — LACTATED RINGERS IV SOLN
INTRAVENOUS | Status: DC | PRN
  Administered 2016-09-18: 12:00:00 via INTRAVENOUS

## 2016-09-18 MED ORDER — ROCURONIUM BROMIDE 50 MG/5ML IV SOSY
PREFILLED_SYRINGE | INTRAVENOUS | Status: AC
  Filled 2016-09-18: qty 5

## 2016-09-18 MED ORDER — CIPROFLOXACIN IN D5W 400 MG/200ML IV SOLN
INTRAVENOUS | Status: AC
  Filled 2016-09-18: qty 200

## 2016-09-18 MED ORDER — LIDOCAINE 2% (20 MG/ML) 5 ML SYRINGE
INTRAMUSCULAR | Status: DC | PRN
  Administered 2016-09-18: 50 mg via INTRAVENOUS

## 2016-09-18 MED ORDER — PROPOFOL 10 MG/ML IV BOLUS
INTRAVENOUS | Status: DC | PRN
Start: 2016-09-18 — End: 2016-09-18
  Administered 2016-09-18: 110 mg via INTRAVENOUS

## 2016-09-18 MED ORDER — MIDAZOLAM HCL 2 MG/2ML IJ SOLN
INTRAMUSCULAR | Status: AC
  Filled 2016-09-18: qty 2

## 2016-09-18 MED ORDER — FENTANYL CITRATE (PF) 100 MCG/2ML IJ SOLN
INTRAMUSCULAR | Status: DC | PRN
  Administered 2016-09-18: 100 ug via INTRAVENOUS

## 2016-09-18 MED ORDER — INDOMETHACIN 50 MG RE SUPP
RECTAL | Status: AC
  Filled 2016-09-18: qty 2

## 2016-09-18 MED ORDER — SODIUM CHLORIDE 0.9 % IV SOLN: INTRAVENOUS | Status: DC

## 2016-09-18 MED ORDER — ONDANSETRON HCL 4 MG/2ML IJ SOLN
INTRAMUSCULAR | Status: AC
  Filled 2016-09-18: qty 2

## 2016-09-18 MED ORDER — HYDROMORPHONE HCL 2 MG/ML IJ SOLN
2.0000 mg | INTRAMUSCULAR | Status: DC | PRN
  Administered 2016-09-18 – 2016-09-21 (×27): 2 mg via INTRAVENOUS
  Filled 2016-09-18 (×27): qty 1

## 2016-09-18 MED ORDER — GLUCAGON HCL RDNA (DIAGNOSTIC) 1 MG IJ SOLR
INTRAMUSCULAR | Status: AC
  Filled 2016-09-18: qty 1

## 2016-09-18 MED ORDER — LIDOCAINE 2% (20 MG/ML) 5 ML SYRINGE
INTRAMUSCULAR | Status: AC
  Filled 2016-09-18: qty 5

## 2016-09-18 MED ORDER — FENTANYL 75 MCG/HR TD PT72: 75.0000 ug | MEDICATED_PATCH | TRANSDERMAL | Status: DC

## 2016-09-18 MED ORDER — SODIUM CHLORIDE 0.9 % IV SOLN
INTRAVENOUS | Status: DC | PRN
  Administered 2016-09-18: 20 mL

## 2016-09-18 MED ORDER — SUGAMMADEX SODIUM 200 MG/2ML IV SOLN
INTRAVENOUS | Status: DC | PRN
  Administered 2016-09-18: 200 mg via INTRAVENOUS

## 2016-09-18 MED ORDER — PROPOFOL 10 MG/ML IV BOLUS
INTRAVENOUS | Status: AC
  Filled 2016-09-18: qty 20

## 2016-09-18 MED ORDER — ONDANSETRON HCL 4 MG/2ML IJ SOLN
INTRAMUSCULAR | Status: DC | PRN
  Administered 2016-09-18: 4 mg via INTRAVENOUS

## 2016-09-18 MED ORDER — CIPROFLOXACIN IN D5W 400 MG/200ML IV SOLN
INTRAVENOUS | Status: DC | PRN
  Administered 2016-09-18: 400 mg via INTRAVENOUS

## 2016-09-18 MED ORDER — ROCURONIUM BROMIDE 10 MG/ML (PF) SYRINGE
PREFILLED_SYRINGE | INTRAVENOUS | Status: DC | PRN
  Administered 2016-09-18: 40 mg via INTRAVENOUS

## 2016-09-18 MED ORDER — MIDAZOLAM HCL 5 MG/5ML IJ SOLN
INTRAMUSCULAR | Status: DC | PRN
  Administered 2016-09-18: 1 mg via INTRAVENOUS

## 2016-09-18 NOTE — Progress Notes (Signed)
Daily Progress Note   Patient Name: Tina Vaughan       Date: 09/18/2016 DOB: 06-Oct-1961  Age: 55 y.o. MRN#: [id number] Attending Physician: Debbe Odea, MD Primary Care Physician: Elise Benne Admit Date: 09/08/2016  Reason for Consultation/Follow-up: pain management   Subjective:  awake alert resting in bed Complains of uncontrolled abdominal pain, she has had to ask for IV Dilaudid non stop Repeat ERCP today  Tolerating diet now NPO for procedure.    See below   Length of Stay: 9  Current Medications: Scheduled Meds:  . buPROPion  300 mg Oral Daily  . calcium carbonate  500 mg of elemental calcium Oral Daily  . cholecalciferol  1,000 Units Oral Daily  . citalopram  10 mg Oral Daily  . enoxaparin (LOVENOX) injection  40 mg Subcutaneous Q24H  . fentaNYL  50 mcg Transdermal Q72H  . gabapentin  300 mg Oral TID  . letrozole  2.5 mg Oral Daily  . LORazepam  1 mg Oral Daily  . LORazepam  2 mg Oral QHS  . nystatin  5 mL Oral QID  . ondansetron (ZOFRAN) IV  4 mg Intravenous Q6H  . pantoprazole  40 mg Oral Daily  . polyethylene glycol  17 g Oral BID  . senna  2 tablet Oral QHS  . zolpidem  5 mg Oral QHS    Continuous Infusions: . sodium chloride 100 mL/hr at 09/18/16 0547    PRN Meds: alum & mag hydroxide-simeth, fluocinonide cream, HYDROmorphone (DILAUDID) injection, ibuprofen, lidocaine-prilocaine, sodium chloride flush  Physical Exam         Awake alert NAD Abdominal distension mild epigastric and RUQ pain.  Faint bowel sounds S1 S2 Clear  Vital Signs: BP (!) 135/96 (BP Location: Right Arm)   Pulse 92   Temp 99.9 F (37.7 C) (Oral)   Resp 17   Ht '5\' 2"'$  (1.575 m)   Wt 96.7 kg (213 lb 3 oz)   LMP 05/08/2016   SpO2 92%   BMI 38.99 kg/m  SpO2: SpO2: 92  % O2 Device: O2 Device: Not Delivered O2 Flow Rate: O2 Flow Rate (L/min): 2 L/min  Intake/output summary:   Intake/Output Summary (Last 24 hours) at 09/18/16 0957 Last data filed at 09/18/16 0545  Gross per 24 hour  Intake  3156.8 ml  Output                0 ml  Net           3156.8 ml   LBM: Last BM Date: 09/17/16 Baseline Weight: Weight: 98.9 kg (218 lb) Most recent weight: Weight: 96.7 kg (213 lb 3 oz)       Palliative Assessment/Data:    Flowsheet Rows   Flowsheet Row Most Recent Value  Intake Tab  Referral Department  Hospitalist  Unit at Time of Referral  Oncology Unit  Palliative Care Primary Diagnosis  Cancer  Palliative Care Type  Return patient Palliative Care  Reason for referral  Pain  Date first seen by Palliative Care  09/14/16  Clinical Assessment  Palliative Performance Scale Score  40%  Pain Max last 24 hours  7  Pain Min Last 24 hours  4  Dyspnea Max Last 24 Hours  4  Dyspnea Min Last 24 hours  3  Nausea Max Last 24 Hours  3  Nausea Min Last 24 Hours  2  Anxiety Max Last 24 Hours  3  Psychosocial & Spiritual Assessment  Palliative Care Outcomes  Patient/Family meeting held?  Yes  Who was at the meeting?  patient who is decisional, sister was not in the room today   Palliative Care Outcomes  Improved pain interventions  Palliative Care follow-up planned  Yes, Facility      Patient Active Problem List   Diagnosis Date Noted  . Abdominal pain, epigastric   . Constipation   . Common bile duct stricture   . 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 Acute pancreatitis without necrosis or infection, unspecified 09/15/2016  . Right upper quadrant abdominal pain   . Encounter for palliative care   . Depression 09/08/2016  . Abnormal liver function 09/08/2016  . Abdominal pain 09/08/2016  . Diarrhea 09/08/2016  . Speech impairment 03/09/2016  . Encounter for chemotherapy management 11/10/2015  . Bone metastasis (HCC) 11/02/2015  .  Morbid obesity (HCC) 08/28/2015  . Shortness of breath 08/04/2015  . Dyspnea 08/04/2015  . Tachycardia 08/04/2015  . Hyperventilation 08/04/2015  . Respiratory rate increased 08/04/2015  . Chemotherapy induced nausea and vomiting 07/13/2015  . Nausea without vomiting 06/08/2015  . Special screening for malignant neoplasms, colon 06/08/2015  . Bloating 06/08/2015  . Early satiety 06/08/2015  . Pain in the abdomen 06/04/2015  . Generalized anxiety disorder 05/21/2015  . Fatigue 05/21/2015  . Sleep apnea 05/21/2015  . Headache 10/22/2014  . Lymphedema of upper extremity 09/11/2014  . Status post prophylactic bilateral salpingo-oophorectomy for BRCA 03/19/2012  . Breast cancer of lower-outer quadrant of left female breast (HCC) 05/18/2011    Palliative Care Assessment & Plan   Patient Profile:   Assessment:  metastatic breast cancer Abdominal pain Pancreatitis S/P ERCP and stenting Repeat ERCP today  Recommendations/Plan:   increase trans dermal fentanyl to 75 mcg, change Q 72 hours.   Change IV Dilaudid to 2 mg IV Q 2 hours prn. Monitor use. Patient with uncontrolled R sided abdominal and epigastric pain.   Bowel regimen in place,    Continue prn zofran use, continue to monitor, denies nausea currently.    Code Status:    Code Status Orders        Start     Ordered   09/08/16 2353  Do not attempt resuscitation (DNR)  Continuous    Question Answer Comment  In the event of cardiac or respiratory  ARREST Do not call a "code blue"   In the event of cardiac or respiratory ARREST Do not perform Intubation, CPR, defibrillation or ACLS   In the event of cardiac or respiratory ARREST Use medication by any route, position, wound care, and other measures to relive pain and suffering. May use oxygen, suction and manual treatment of airway obstruction as needed for comfort.      09/08/16 2353    Code Status History    Date Active Date Inactive Code Status Order ID Comments  User Context   09/08/2016  8:55 PM 09/08/2016 11:53 PM DNR [id number]  Lorayne Bender, PA-C ED    Advance Directive Documentation   Flowsheet Row Most Recent Value  Type of Advance Directive  Healthcare Power of Attorney  Pre-existing out of facility DNR order (yellow form or pink MOST form)  Yellow form placed in chart (order not valid for inpatient use)  "MOST" Form in Place?  No data       Prognosis:   Unable to determine  Discharge Planning:  To Be Determined  Care plan was discussed with  Patient   Thank you for allowing the Palliative Medicine Team to assist in the care of this patient.   Time In: 9 Time Out: 9.25 Total Time 25 Prolonged Time Billed  no       Greater than 50%  of this time was spent counseling and coordinating care related to the above assessment and plan.  Loistine Chance, MD [phone]  Please contact Palliative Medicine Team phone at [phone] for questions and concerns.

## 2016-09-18 NOTE — Progress Notes (Signed)
Patient back to room, S/P ERCP. Alert and oriented x 3. Vital signs obtained. Will continue to monitor.

## 2016-09-18 NOTE — Anesthesia Procedure Notes (Signed)
Procedure Name: Intubation Date/Time: 09/18/2016 11:51 AM Performed by: Anne Fu Pre-anesthesia Checklist: Patient identified, Emergency Drugs available, Suction available, Patient being monitored and Timeout performed Patient Re-evaluated:Patient Re-evaluated prior to inductionOxygen Delivery Method: Circle system utilized Preoxygenation: Pre-oxygenation with 100% oxygen Intubation Type: IV induction Ventilation: Mask ventilation without difficulty Laryngoscope Size: Mac and 4 Grade View: Grade III Tube type: Oral Tube size: 7.5 mm Number of attempts: 1 Airway Equipment and Method: Stylet Placement Confirmation: ETT inserted through vocal cords under direct vision,  positive ETCO2,  CO2 detector and breath sounds checked- equal and bilateral Secured at: 20 cm Tube secured with: Tape Dental Injury: Teeth and Oropharynx as per pre-operative assessment

## 2016-09-18 NOTE — Anesthesia Postprocedure Evaluation (Signed)
Anesthesia Post Note  Patient: Tina Vaughan  Procedure(s) Performed: Procedure(s) (LRB): ENDOSCOPIC RETROGRADE CHOLANGIOPANCREATOGRAPHY (ERCP) (N/A)  Patient location during evaluation: Endoscopy Anesthesia Type: General Level of consciousness: awake and alert, oriented and patient cooperative Pain management: pain level controlled Vital Signs Assessment: post-procedure vital signs reviewed and stable Respiratory status: spontaneous breathing, nonlabored ventilation, respiratory function stable and patient connected to nasal cannula oxygen Cardiovascular status: blood pressure returned to baseline and stable Postop Assessment: no signs of nausea or vomiting Anesthetic complications: no    Last Vitals:  Vitals:   09/18/16 1310 09/18/16 1320  BP: (!) 136/96 129/78  Pulse: 98 99  Resp: 16 (!) 21  Temp:      Last Pain:  Vitals:   09/18/16 1250  TempSrc: Axillary  PainSc:                  Etan Vasudevan,E. Marika Mahaffy

## 2016-09-18 NOTE — Progress Notes (Signed)
Tina Vaughan 11:38 AM  Subjective: Patient without any new complaints and her case rediscussed with her and her best friend and we discussed the negative biopsies but how I still felt this was malignant extrinsic compression and we answered all of their questions  Objective: Vital signs stable afebrile no acute distress exam please see preassessment evaluation labs essentially unchanged  Assessment: Malignant stricture need stent replacement  Plan: Will proceed with ERCP stent change with anesthesia assistance  [facility] E  Pager [phone] After 5PM or if no answer call [phone]

## 2016-09-18 NOTE — Transfer of Care (Signed)
Immediate Anesthesia Transfer of Care Note  Patient: Tina Vaughan  Procedure(s) Performed: Procedure(s): ENDOSCOPIC RETROGRADE CHOLANGIOPANCREATOGRAPHY (ERCP) (N/A)  Patient Location: PACU  Anesthesia Type:General  Level of Consciousness:  sedated, patient cooperative and responds to stimulation  Airway & Oxygen Therapy:Patient Spontanous Breathing and Patient connected to face mask oxgen  Post-op Assessment:  Report given to PACU RN and Post -op Vital signs reviewed and stable  Post vital signs:  Reviewed and stable  Last Vitals:  Vitals:   09/18/16 1248 09/18/16 1250  BP: 119/86 137/83  Pulse: (!) 105 (!) 105  Resp: 17 18  Temp:  123XX123 C    Complications: No apparent anesthesia complications

## 2016-09-18 NOTE — Progress Notes (Signed)
PROGRESS NOTE    Tina Vaughan  [id number] DOB: [birth year] DOA: 09/08/2016  PCP: Mackie Pai, PA-C   Brief Narrative:  55 y.o.femalewith medical history significant of metastatic breast cancer (s/p of surgery and currently on Ibrance and letrozole), GERD, depression, anxiety, OSA on CPAP, edema, who presents with abdominal pain.  Pt states that she has been having abdominal pain in the past 5 days, which is new. Her abdominal pain is located in the right upper quadrant, constant, 5 out of 10 in severity, radiating to the right back. She also hasnausea, but no vomiting. She has been having diarrhea in the past 4 days. She states that she was on stool softener, but she continues to have diarrhea after she stopped stool softer 3 days ago. She has 5-7 bowel movementswith loose stool each day. No fever or chills. Patient denies symptoms of UTI. She does not have chest pain, shortness of breath, cough, unilateral weakness   Subjective: Abdominal pain is uncontrolled today. No nausea.   Assessment & Plan:  Abdominal pain and Abnormal liver function: -Presenting ALP 101, AST 110, ALT 311 and total bilirubin 5.1. -CT abd with fatty liver and periportal adenopathy  - Abd [name] with fatty liver   - MRCP demonstrated suspicion for CBD obstruction -  ERCP shows biliary stricture suspected to be from external compression (from lymph nodes) which has been stented -LFT's up -   ERCP repeated today- stent was nearly out of CBD and was replaced with a metal stent- a CBD stricture was seen today  Acute pancreatitis - due to ERCP-   - improving -  Continue diet per GI  Breast cancer of lower-outer quadrant of left female breast [facility]):  -Patient is s/p mastectomy -Seen by Dr. Lindi Adie -Patient is continued on Ibrance and letrozole  Depression and anxiety: -Mood seems stable at present -Will continue Wellbutrin, Celexa and Ativan as tolerated  Recent Diarrhea/ grey stools -No further  diarrhea noted  Positive urinalysis:  -Initial UA was suggestive of possible UTI -Patient is now s/p fosfomycin  -Urine culture with multiple species present -Afebrile  Fatty liver - noted on imaging    DVT prophylaxis: Lovenox Code Status: DNR Family Communication:  Disposition Plan: home when stable Consultants:   GI Procedures:   ERCP 11/1 Antimicrobials:  Anti-infectives    Start     Dose/Rate Route Frequency Ordered Stop   09/13/16 1145  ciprofloxacin (CIPRO) IVPB 400 mg     400 mg 200 mL/hr over 60 Minutes Intravenous  Once 09/13/16 1137 09/13/16 1154       Objective: Vitals:   09/18/16 1310 09/18/16 1320 09/18/16 1330 09/18/16 1356  BP: (!) 136/96 129/78 (!) 148/88 (!) 150/93  Pulse: 98 99 98   Resp: 16 (!) 21 17   Temp:    98.2 F (36.8 C)  TempSrc:    Oral  SpO2: 96% 96% 95% 92%  Weight:      Height:        Intake/Output Summary (Last 24 hours) at 09/18/16 1426 Last data filed at 09/18/16 1254  Gross per 24 hour  Intake           3156.8 ml  Output                0 ml  Net           3156.8 ml   Filed Weights   09/08/16 1843 09/09/16 0127  Weight: 98.9 kg (218 lb) 96.7 kg (213 lb 3  oz)    Examination: General exam: Appears comfortable  HEENT: PERRLA, oral mucosa moist, no sclera icterus or thrush Respiratory system: Clear to auscultation. Respiratory effort normal. Cardiovascular system: S1 & S2 heard, RRR.  No murmurs  Gastrointestinal system: Abdomen soft,  Tender in epigastrium, nondistended. Normal bowel sound. No organomegaly Central nervous system: Alert and oriented. No focal neurological deficits. Extremities: No cyanosis, clubbing or edema Skin: No rashes or ulcers Psychiatry:  Mood & affect appropriate.     Data Reviewed: I have personally reviewed following labs and imaging studies  CBC:  Recent Labs Lab 09/12/16 0540 09/14/16 0503 09/16/16 0500  WBC 2.2* 4.6 5.4  NEUTROABS  --  3.7 4.1  HGB 11.8* 13.0 11.5*  HCT  33.5* 37.7 33.6*  MCV 93.6 94.0 95.7  PLT 135* 153 [id number]   Basic Metabolic Panel:  Recent Labs Lab 09/14/16 0503 09/15/16 0625 09/16/16 0500 09/17/16 0555 09/18/16 0438  NA 137 136 136 138 138  K 3.7 3.8 3.8 3.6 3.6  CL 104 105 102 104 104  CO2 25 25 26 26 27   GLUCOSE 121* 106* 111* 109* 105*  BUN 7 6 6  <5* <5*  CREATININE 0.37* 0.49 0.40* 0.41* 0.48  CALCIUM 8.5* 8.8* 8.9 8.9 9.1   GFR: Estimated Creatinine Clearance: 87.2 mL/min (by C-G formula based on SCr of 0.48 mg/dL). Liver Function Tests:  Recent Labs Lab 09/14/16 0503 09/15/16 0625 09/16/16 0500 09/17/16 0555 09/18/16 0438  AST 125* 73* 140* 149* 179*  ALT 228* 167* 178* 194* 216*  ALKPHOS 125 138* 177* 178* 191*  BILITOT 7.6* 4.2* 7.5* 7.9* 7.7*  PROT 7.0 7.1 6.4* 6.4* 6.8  ALBUMIN 3.8 3.7 3.1* 3.1* 3.1*    Recent Labs Lab 09/14/16 0503 09/15/16 0625 09/16/16 0500  LIPASE 3,006* 1,326* 362*   No results for input(s): AMMONIA in the last 168 hours. Coagulation Profile: No results for input(s): INR, PROTIME in the last 168 hours. Cardiac Enzymes: No results for input(s): CKTOTAL, CKMB, CKMBINDEX, TROPONINI in the last 168 hours. BNP (last 3 results) No results for input(s): PROBNP in the last 8760 hours. HbA1C: No results for input(s): HGBA1C in the last 72 hours. CBG:  Recent Labs Lab 09/14/16 0749 09/15/16 0805 09/16/16 0731 09/17/16 0735 09/18/16 0738  GLUCAP 104* 106* 105* 99 103*   Lipid Profile: No results for input(s): CHOL, HDL, LDLCALC, TRIG, CHOLHDL, LDLDIRECT in the last 72 hours. Thyroid Function Tests: No results for input(s): TSH, T4TOTAL, FREET4, T3FREE, THYROIDAB in the last 72 hours. Anemia Panel: No results for input(s): VITAMINB12, FOLATE, FERRITIN, TIBC, IRON, RETICCTPCT in the last 72 hours. Urine analysis:    Component Value Date/Time   COLORURINE ORANGE (A) 09/08/2016 2000   APPEARANCEUR CLEAR 09/08/2016 2000   LABSPEC 1.019 09/08/2016 2000   LABSPEC 1.015  01/18/2011 1144   PHURINE 6.5 09/08/2016 2000   GLUCOSEU NEGATIVE 09/08/2016 2000   HGBUR TRACE (A) 09/08/2016 2000   BILIRUBINUR LARGE (A) 09/08/2016 2000   BILIRUBINUR 1 06/04/2015 1122   BILIRUBINUR Negative 01/18/2011 [place] 09/08/2016 2000   PROTEINUR NEGATIVE 09/08/2016 2000   UROBILINOGEN 0.2 06/04/2015 2235   NITRITE POSITIVE (A) 09/08/2016 2000   LEUKOCYTESUR SMALL (A) 09/08/2016 2000   LEUKOCYTESUR Trace 01/18/2011 1144   Sepsis Labs: @LABRCNTIP (procalcitonin:4,lacticidven:4) ) Recent Results (from the past 240 hour(s))  Urine culture     Status: Abnormal   Collection Time: 09/08/16 11:51 PM  Result Value Ref Range Status   Specimen Description URINE, Bells  Final   Special Requests NONE  Final   Culture MULTIPLE SPECIES PRESENT, SUGGEST RECOLLECTION (A)  Final   Report Status 09/10/2016 FINAL  Final         Radiology Studies: Dg Ercp  Result Date: 09/18/2016 CLINICAL DATA:  Jaundice.  Stent exchange. EXAM: ERCP TECHNIQUE: Multiple spot images obtained with the fluoroscopic device and submitted for interpretation post-procedure. FLUOROSCOPY TIME:  Fluoroscopy Time:  2 minutes and 17 seconds Radiation Exposure Index (if provided by the fluoroscopic device): 78.4 mGy COMPARISON:  09/13/2016 FINDINGS: The metallic stent was removed. Wire advanced into the central intrahepatic ducts. There is contrast in the intrahepatic ducts. A new metallic biliary stent was placed. Again noted is narrowing in the distal common bile duct region. IMPRESSION: Biliary stent exchange. These images were submitted for radiologic interpretation only. Please see the procedural report for the amount of contrast and the fluoroscopy time utilized. Electronically Signed   By: Markus Daft M.D.   On: 09/18/2016 12:50      Scheduled Meds: . buPROPion  300 mg Oral Daily  . calcium carbonate  500 mg of elemental calcium Oral Daily  . cholecalciferol  1,000 Units Oral Daily  .  citalopram  10 mg Oral Daily  . enoxaparin (LOVENOX) injection  40 mg Subcutaneous Q24H  . [START ON 09/19/2016] fentaNYL  75 mcg Transdermal Q72H  . gabapentin  300 mg Oral TID  . letrozole  2.5 mg Oral Daily  . LORazepam  1 mg Oral Daily  . LORazepam  2 mg Oral QHS  . nystatin  5 mL Oral QID  . ondansetron (ZOFRAN) IV  4 mg Intravenous Q6H  . pantoprazole  40 mg Oral Daily  . polyethylene glycol  17 g Oral BID  . senna  2 tablet Oral QHS  . zolpidem  5 mg Oral QHS   Continuous Infusions: . sodium chloride 500 mL (09/18/16 1412)     LOS: 9 days    Time spent in minutes: 60 min    Avanell Banwart, MD [facility] Pager: [email or web address] Password [email or web address] 09/18/2016, 2:26 PM

## 2016-09-18 NOTE — Care Management Important Message (Signed)
Important Message  Patient Details  Name: CANYON YANDO MRN: [id number] Date of Birth: [birth year]   Medicare Important Message Given:  Yes    Camillo Flaming 09/18/2016, 9:14 AMImportant Message  Patient Details  Name: ELVINA RAN MRN: [id number] Date of Birth: 03-14-1961   Medicare Important Message Given:  Yes    Camillo Flaming 09/18/2016, 9:14 AM

## 2016-09-18 NOTE — Anesthesia Preprocedure Evaluation (Addendum)
Anesthesia Evaluation  Patient identified by MRN, date of birth, ID band Patient awake    Reviewed: Allergy & Precautions, NPO status , Patient's Chart, lab work & pertinent test results  History of Anesthesia Complications Negative for: history of anesthetic complications  Airway Mallampati: II  TM Distance: >3 FB Neck ROM: Full    Dental  (+) Dental Advisory Given   Pulmonary sleep apnea ,  Breast mets to lung   breath sounds clear to auscultation       Cardiovascular (-) angina Rhythm:Regular Rate:Normal  '16 ECHO: EF 60-65%, valves OK   Neuro/Psych    GI/Hepatic GERD  Medicated and Controlled,Breast mets to liver: markedly elevated LFTs   Endo/Other  Morbid obesity  Renal/GU negative Renal ROS     Musculoskeletal Breast mets to bone   Abdominal (+) + obese,   Peds  Hematology negative hematology ROS (+)   Anesthesia Other Findings Breast cancer  Reproductive/Obstetrics                            Anesthesia Physical Anesthesia Plan  ASA: III  Anesthesia Plan: General   Post-op Pain Management:    Induction: Intravenous  Airway Management Planned: Oral ETT  Additional Equipment:   Intra-op Plan:   Post-operative Plan: Extubation in OR  Informed Consent: I have reviewed the patients History and Physical, chart, labs and discussed the procedure including the risks, benefits and alternatives for the proposed anesthesia with the patient or authorized representative who has indicated his/her understanding and acceptance.   Dental advisory given  Plan Discussed with: CRNA and Surgeon  Anesthesia Plan Comments: (Plan routine monitors, GETA)        Anesthesia Quick Evaluation

## 2016-09-18 NOTE — Op Note (Signed)
[facility] Patient Name: Tina Vaughan Procedure Date: 09/18/2016 MRN: [id number] Attending MD: Clarene Essex , MD Date of Birth: Apr 02, 1961 CSN: [id number] Age: 55 Admit Type: Inpatient Procedure:                ERCP Indications:              Abnormal MRCP, Elevated liver enzymes probable                            malignant stricture Providers:                Clarene Essex, MD, Cleda Daub, RN, Carolynn Comment, RN, William Dalton, Technician, Delena Bali, CRNA Referring MD:              Medicines:                General Anesthesia Complications:            No immediate complications. Estimated Blood Loss:     Estimated blood loss: none. Procedure:                Pre-Anesthesia Assessment:                           - Prior to the procedure, a History and Physical                            was performed, and patient medications and                            allergies were reviewed. The patient's tolerance of                            previous anesthesia was also reviewed. The risks                            and benefits of the procedure and the sedation                            options and risks were discussed with the patient.                            All questions were answered, and informed consent                            was obtained. Prior Anticoagulants: The patient has                            taken no previous anticoagulant or antiplatelet                            agents. ASA Grade Assessment: II - A patient  with                            mild systemic disease. After reviewing the risks                            and benefits, the patient was deemed in                            satisfactory condition to undergo the procedure.                           - Prior to the procedure, a History and Physical                            was performed, and patient medications and       allergies were reviewed. The patient's tolerance of                            previous anesthesia was also reviewed. The risks                            and benefits of the procedure and the sedation                            options and risks were discussed with the patient.                            All questions were answered, and informed consent                            was obtained. Prior Anticoagulants: The patient has                            taken no previous anticoagulant or antiplatelet                            agents. ASA Grade Assessment: II - A patient with                            mild systemic disease. After reviewing the risks                            and benefits, the patient was deemed in                            satisfactory condition to undergo the procedure.                           After obtaining informed consent, the scope was                            passed under direct vision. Throughout the  procedure, the patient's blood pressure, pulse, and                            oxygen saturations were monitored continuously. The                            [id number]) scope was introduced through the                            mouth and advanced into the duodenum and the stent                            was grabbed as below and retrieved and then we                            switched to the ERCP scope and used it to inject                            contrast into and used to locate the major papilla.                            The was introduced through the mouth, and used to                            inject contrast into and used to locate the major                            papilla. The ERCP was accomplished without                            difficulty. The patient tolerated the procedure                            well. Findings:      The scope was passed through the upper GI tract without discovering UGI        findings. One covered metal stent originating in the common bile duct       was emerging from the major papilla. The stent was almodt completely out       of duct. One stent was removed from the common bile duct using a Raptor       grasping device and sent for cytology. after cannulation as abovewhich       was done on the initial attempt and there was no pancreatic duct       injection or wire advancement and we confirmed the biliary stricture we       placed One 10 Fr by 6 cm covered metal stent was placed into the common       bile duct. Bile flowed through the stent. The stent was in good       position. the introducer and the wire was removed the scope was removed       and patient tolerated the procedure well there was no obvious immediate       complication Impression:               -  original stent was almost completely out of duct                            and was seen in the major papilla.                           - One stent was removed from the common bile duct.                           - One covered metal stent was placed into the                            common bile duct.                           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 The stricture was malignant appearing.                            This stricture was treated with stent placement. Moderate Sedation:      N/A- Per Anesthesia Care Recommendation:           - Clear liquid diet today.                           - Continue present medications.                           - Await cytology results.                           - Return to GI clinic in 1 week.                           - Telephone GI clinic if symptomatic PRN.                           - Telephone GI clinic for pathology results in 4                            days. if biopsy is nondiagnostic and tissue sample                            needed probably talk to IR about CT directed biopsy Procedure  Code(s):        --- Professional ---                           [phone], Esophagogastroduodenoscopy, flexible,                            transoral; with removal of foreign body(s) Diagnosis Code(s):        --- Professional ---  Z96.89, Presence of other specified functional                            implants                           Z46.59, Encounter for fitting and adjustment of                            other gastrointestinal appliance and device                           K83.1, Obstruction of bile duct                           R74.8, Abnormal levels of other serum enzymes                           R93.2, Abnormal findings on diagnostic imaging of                            liver and biliary tract CPT copyright 2016 [employer]. All rights reserved. The codes documented in this report are preliminary and upon coder review may  be revised to meet current compliance requirements. Clarene Essex, MD 09/18/2016 12:44:30 PM This report has been signed electronically. Number of Addenda: 0

## 2016-09-19 ENCOUNTER — Encounter (HOSPITAL_COMMUNITY): Payer: Self-pay | Admitting: Gastroenterology

## 2016-09-19 DIAGNOSIS — F329 Major depressive disorder, single episode, unspecified: Secondary | ICD-10-CM

## 2016-09-19 LAB — COMPREHENSIVE METABOLIC PANEL
ALK PHOS: 217 U/L — AB (ref 38–126)
ALT: 192 U/L — AB (ref 14–54)
AST: 140 U/L — ABNORMAL HIGH (ref 15–41)
Albumin: 3 g/dL — ABNORMAL LOW (ref 3.5–5.0)
Anion gap: 8 (ref 5–15)
BUN: <5 mg/dL — ABNORMAL LOW (ref 6–20)
CALCIUM: 8.7 mg/dL — AB (ref 8.9–10.3)
CO2: 27 mmol/L (ref 22–32)
CREATININE: 0.43 mg/dL — AB (ref 0.44–1.00)
Chloride: 103 mmol/L (ref 101–111)
Glucose, Bld: 110 mg/dL — ABNORMAL HIGH (ref 65–99)
Potassium: 3.4 mmol/L — ABNORMAL LOW (ref 3.5–5.1)
Sodium: 138 mmol/L (ref 135–145)
Total Bilirubin: 8 mg/dL — ABNORMAL HIGH (ref 0.3–1.2)
Total Protein: 6.5 g/dL (ref 6.5–8.1)

## 2016-09-19 LAB — GLUCOSE, CAPILLARY: GLUCOSE-CAPILLARY: 97 mg/dL (ref 65–99)

## 2016-09-19 LAB — CBC WITH DIFFERENTIAL/PLATELET
BASOS PCT: 1 %
Basophils Absolute: 0 10*3/uL (ref 0.0–0.1)
EOS ABS: 0.1 10*3/uL (ref 0.0–0.7)
EOS PCT: 3 %
HCT: 32.4 % — ABNORMAL LOW (ref 36.0–46.0)
HEMOGLOBIN: 10.8 g/dL — AB (ref 12.0–15.0)
Lymphocytes Relative: 15 %
Lymphs Abs: 0.7 10*3/uL (ref 0.7–4.0)
MCH: 32.4 pg (ref 26.0–34.0)
MCHC: 33.3 g/dL (ref 30.0–36.0)
MCV: 97.3 fL (ref 78.0–100.0)
MONOS PCT: 13 %
Monocytes Absolute: 0.6 10*3/uL (ref 0.1–1.0)
NEUTROS PCT: 68 %
Neutro Abs: 3.1 10*3/uL (ref 1.7–7.7)
PLATELETS: 301 10*3/uL (ref 150–400)
RBC: 3.33 MIL/uL — ABNORMAL LOW (ref 3.87–5.11)
RDW: 15.1 % (ref 11.5–15.5)
WBC: 4.5 10*3/uL (ref 4.0–10.5)

## 2016-09-19 MED ORDER — FENTANYL 75 MCG/HR TD PT72: 75.0000 ug | MEDICATED_PATCH | TRANSDERMAL | Status: DC

## 2016-09-19 MED ORDER — FENTANYL 75 MCG/HR TD PT72
75.0000 ug | MEDICATED_PATCH | TRANSDERMAL | Status: DC
  Administered 2016-09-19: 75 ug via TRANSDERMAL
  Filled 2016-09-19: qty 1

## 2016-09-19 NOTE — Progress Notes (Signed)
Soaps suds emena given with small amout of mucus Bm noted.

## 2016-09-19 NOTE — Progress Notes (Signed)
[facility] Gastroenterology Progress Note  Tina Vaughan 55 y.o. 02-07-1961   Subjective: Abdominal pain a little better today. Denies N/V.  Objective: Vital signs in last 24 hours: Vitals:   09/18/16 2216 09/19/16 0633  BP: 124/87 128/80  Pulse: (!) 106 90  Resp: 18 16  Temp: 99.1 F (37.3 C) 97.5 F (36.4 C)    Physical Exam: Gen: lethargic, no acute distress CV: RRR Chest: CTA B Abd: epigastric and RUQ tenderness with guarding, soft, nondistended, +BS Ext: no edema  Lab Results:  Recent Labs  09/18/16 0438 09/19/16 0526  NA 138 138  K 3.6 3.4*  CL 104 103  CO2 27 27  GLUCOSE 105* 110*  BUN <5* <5*  CREATININE 0.48 0.43*  CALCIUM 9.1 8.7*    Recent Labs  09/18/16 0438 09/19/16 0526  AST 179* 140*  ALT 216* 192*  ALKPHOS 191* 217*  BILITOT 7.7* 8.0*  PROT 6.8 6.5  ALBUMIN 3.1* 3.0*    Recent Labs  09/19/16 0526  WBC 4.5  NEUTROABS 3.1  HGB 10.8*  HCT 32.4*  MCV 97.3  PLT 301   No results for input(s): LABPROT, INR in the last 72 hours.    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 LFTs without significant change but ERCP yesterday so hopefully LFTs will start improving tomorrow. Tried to reassure. Old biliary stent sent for cytology that is pending. Change to full liquid diet. Supportive care.   Closter C. 09/19/2016, 8:47 AM  Pager [phone]  If no answer or after 5 PM call [phone] ID: Tina Vaughan, female   DOB: 02/06/1961, 55 y.o.   MRN: [id number]

## 2016-09-19 NOTE — Progress Notes (Addendum)
PROGRESS NOTE    Tina Vaughan  [id number] DOB: 14-Mar-1961 DOA: 09/08/2016  PCP: Mackie Pai, PA-C   Brief Narrative:  55 y.o.femalewith medical history significant of metastatic breast cancer (s/p of surgery and currently on Ibrance and letrozole), GERD, depression, anxiety, OSA on CPAP, edema, who presents with abdominal pain.  Pt states that she has been having abdominal pain in the past 5 days, which is new. Her abdominal pain is located in the right upper quadrant, constant, 5 out of 10 in severity, radiating to the right back. She also hasnausea, but no vomiting. She has been having diarrhea in the past 4 days. She states that she was on stool softener, but she continues to have diarrhea after she stopped stool softer 3 days ago. She has 5-7 bowel movementswith loose stool each day, light colored stool.  No fever or chills. UA+ Patient denies symptoms of UTI.  She was found to have elevated LFTs in the ER. CT revealed periportal adenopathy. ERCP on 11/1 performed- CBD stent place for what looked like external compression of CBD. LFTs did not improve and therefore repeat ERCP performed yesterday with removal of prior stent which was noted to be almost out of the CBD and insertion of a new stent. CBD stricture noted. Brushings from 11/1 unrevealing.    Subjective: Abdominal pain is better controlled today.  No nausea. Urine still very dark.   Assessment & Plan:  Abdominal pain and Abnormal liver function: -Presenting ALP 101, AST 110, ALT 311 and total bilirubin 5.1. -CT abd with fatty liver and periportal adenopathy  - Abd [name] with fatty liver   - MRCP demonstrated suspicion for CBD obstruction -  ERCP shows biliary stricture suspected to be from external compression (from lymph nodes) which was stented. She developed post ERCP acute pancreatitis which has resolved.  LFT's did not improve therefore, ERCP repeated 11/6- stent was nearly out of CBD and was replaced with a metal  stent- a CBD stricture was seen - LFTs still up today- will follow   Acute pancreatitis - due to ERCP-   - improving -  Continue diet per GI  Breast cancer of lower-outer quadrant of left female breast with bone mets and periportal lymphadenopathy- chronic pain -Patient is s/p mastectomy -Seen by Dr. Lindi Adie -Patient is continued on Ibrance and letrozole - appreciate palliative care assistance with pain management  Depression and anxiety: -Mood seems stable at present -Will continue Wellbutrin, Celexa and Ativan as tolerated  Recent Diarrhea/ grey stools -No further diarrhea noted  Positive urinalysis:  -Initial UA was suggestive of possible UTI -Patient is now s/p fosfomycin  -Urine culture with multiple species present -Afebrile  Fatty liver - noted on imaging    DVT prophylaxis: Lovenox Code Status: DNR Family Communication:  Disposition Plan: home when stable Consultants:   GI  Palliative care Procedures:   ERCP 11/1 Antimicrobials:  Anti-infectives    Start     Dose/Rate Route Frequency Ordered Stop   09/13/16 1145  ciprofloxacin (CIPRO) IVPB 400 mg     400 mg 200 mL/hr over 60 Minutes Intravenous  Once 09/13/16 1137 09/13/16 1154       Objective: Vitals:   09/18/16 1330 09/18/16 1356 09/18/16 2216 09/19/16 0633  BP: (!) 148/88 (!) 150/93 124/87 128/80  Pulse: 98  (!) 106 90  Resp: 17  18 16   Temp:  98.2 F (36.8 C) 99.1 F (37.3 C) 97.5 F (36.4 C)  TempSrc:  Oral Oral Oral  SpO2:  95% 92% 90% 96%  Weight:      Height:        Intake/Output Summary (Last 24 hours) at 09/19/16 1131 Last data filed at 09/19/16 0903  Gross per 24 hour  Intake          3191.67 ml  Output                0 ml  Net          3191.67 ml   Filed Weights   09/08/16 1843 09/09/16 0127  Weight: 98.9 kg (218 lb) 96.7 kg (213 lb 3 oz)    Examination: General exam: Appears comfortable  HEENT: PERRLA, oral mucosa moist, no sclera icterus or  thrush Respiratory system: Clear to auscultation. Respiratory effort normal. Cardiovascular system: S1 & S2 heard, RRR.  No murmurs  Gastrointestinal system: Abdomen soft,  Mildly tender in epigastrium, nondistended. Normal bowel sound. No organomegaly Central nervous system: Alert and oriented. No focal neurological deficits. Extremities: No cyanosis, clubbing or edema Skin: No rashes or ulcers Psychiatry:  Mood & affect appropriate.     Data Reviewed: I have personally reviewed following labs and imaging studies  CBC:  Recent Labs Lab 09/14/16 0503 09/16/16 0500 09/19/16 0526  WBC 4.6 5.4 4.5  NEUTROABS 3.7 4.1 3.1  HGB 13.0 11.5* 10.8*  HCT 37.7 33.6* 32.4*  MCV 94.0 95.7 97.3  PLT 153 171 [id number]   Basic Metabolic Panel:  Recent Labs Lab 09/15/16 0625 09/16/16 0500 09/17/16 0555 09/18/16 0438 09/19/16 0526  NA 136 136 138 138 138  K 3.8 3.8 3.6 3.6 3.4*  CL 105 102 104 104 103  CO2 25 26 26 27 27   GLUCOSE 106* 111* 109* 105* 110*  BUN 6 6 <5* <5* <5*  CREATININE 0.49 0.40* 0.41* 0.48 0.43*  CALCIUM 8.8* 8.9 8.9 9.1 8.7*   GFR: Estimated Creatinine Clearance: 87.2 mL/min (by C-G formula based on SCr of 0.43 mg/dL (L)). Liver Function Tests:  Recent Labs Lab 09/15/16 0625 09/16/16 0500 09/17/16 0555 09/18/16 0438 09/19/16 0526  AST 73* 140* 149* 179* 140*  ALT 167* 178* 194* 216* 192*  ALKPHOS 138* 177* 178* 191* 217*  BILITOT 4.2* 7.5* 7.9* 7.7* 8.0*  PROT 7.1 6.4* 6.4* 6.8 6.5  ALBUMIN 3.7 3.1* 3.1* 3.1* 3.0*    Recent Labs Lab 09/14/16 0503 09/15/16 0625 09/16/16 0500  LIPASE 3,006* 1,326* 362*   No results for input(s): AMMONIA in the last 168 hours. Coagulation Profile: No results for input(s): INR, PROTIME in the last 168 hours. Cardiac Enzymes: No results for input(s): CKTOTAL, CKMB, CKMBINDEX, TROPONINI in the last 168 hours. BNP (last 3 results) No results for input(s): PROBNP in the last 8760 hours. HbA1C: No results for input(s):  HGBA1C in the last 72 hours. CBG:  Recent Labs Lab 09/15/16 0805 09/16/16 0731 09/17/16 0735 09/18/16 0738 09/19/16 0746  GLUCAP 106* 105* 99 103* 97   Lipid Profile: No results for input(s): CHOL, HDL, LDLCALC, TRIG, CHOLHDL, LDLDIRECT in the last 72 hours. Thyroid Function Tests: No results for input(s): TSH, T4TOTAL, FREET4, T3FREE, THYROIDAB in the last 72 hours. Anemia Panel: No results for input(s): VITAMINB12, FOLATE, FERRITIN, TIBC, IRON, RETICCTPCT in the last 72 hours. Urine analysis:    Component Value Date/Time   COLORURINE ORANGE (A) 09/08/2016 2000   APPEARANCEUR CLEAR 09/08/2016 2000   LABSPEC 1.019 09/08/2016 2000   LABSPEC 1.015 01/18/2011 1144   PHURINE 6.5 09/08/2016 [place] NEGATIVE 09/08/2016 2000  HGBUR TRACE (A) 09/08/2016 2000   BILIRUBINUR LARGE (A) 09/08/2016 2000   BILIRUBINUR 1 06/04/2015 1122   BILIRUBINUR Negative 01/18/2011 1144   KETONESUR NEGATIVE 09/08/2016 2000   PROTEINUR NEGATIVE 09/08/2016 2000   UROBILINOGEN 0.2 06/04/2015 2235   NITRITE POSITIVE (A) 09/08/2016 2000   LEUKOCYTESUR SMALL (A) 09/08/2016 2000   LEUKOCYTESUR Trace 01/18/2011 1144   Sepsis Labs: @LABRCNTIP (procalcitonin:4,lacticidven:4) ) No results found for this or any previous visit (from the past 240 hour(s)).       Radiology Studies: Dg Ercp  Result Date: 09/18/2016 CLINICAL DATA:  Jaundice.  Stent exchange. EXAM: ERCP TECHNIQUE: Multiple spot images obtained with the fluoroscopic device and submitted for interpretation post-procedure. FLUOROSCOPY TIME:  Fluoroscopy Time:  2 minutes and 17 seconds Radiation Exposure Index (if provided by the fluoroscopic device): 78.4 mGy COMPARISON:  09/13/2016 FINDINGS: The metallic stent was removed. Wire advanced into the central intrahepatic ducts. There is contrast in the intrahepatic ducts. A new metallic biliary stent was placed. Again noted is narrowing in the distal common bile duct region. IMPRESSION: Biliary  stent exchange. These images were submitted for radiologic interpretation only. Please see the procedural report for the amount of contrast and the fluoroscopy time utilized. Electronically Signed   By: Markus Daft M.D.   On: 09/18/2016 12:50      Scheduled Meds: . buPROPion  300 mg Oral Daily  . calcium carbonate  500 mg of elemental calcium Oral Daily  . cholecalciferol  1,000 Units Oral Daily  . citalopram  10 mg Oral Daily  . enoxaparin (LOVENOX) injection  40 mg Subcutaneous Q24H  . fentaNYL  75 mcg Transdermal Q72H  . gabapentin  300 mg Oral TID  . letrozole  2.5 mg Oral Daily  . LORazepam  1 mg Oral Daily  . LORazepam  2 mg Oral QHS  . nystatin  5 mL Oral QID  . ondansetron (ZOFRAN) IV  4 mg Intravenous Q6H  . pantoprazole  40 mg Oral Daily  . polyethylene glycol  17 g Oral BID  . senna  2 tablet Oral QHS  . zolpidem  5 mg Oral QHS   Continuous Infusions: . sodium chloride 1,000 mL (09/19/16 0259)     LOS: 10 days    Time spent in minutes: 91 min    Harlyn Rathmann, MD [facility] Pager: [email or web address] Password [email or web address] 09/19/2016, 11:31 AM

## 2016-09-19 NOTE — Progress Notes (Signed)
Initial Nutrition Assessment  DOCUMENTATION CODES:   Obesity unspecified  INTERVENTION:  - Continue to advance diet as medically feasible. - RD will continue to monitor for nutrition-related needs.   NUTRITION DIAGNOSIS:   Inadequate oral intake related to acute illness, other (see comment) (diet order changes) as evidenced by other (see comment) (liquid diets not fully meeting estimated nutrition needs).  GOAL:   Patient will meet greater than or equal to 90% of their needs  MONITOR:   PO intake, Diet advancement, Weight trends, Labs, I & O's  REASON FOR ASSESSMENT:   LOS  ASSESSMENT:   55 y.o. female with medical history significant of metastatic breast cancer (s/p of surgery and currently on Ibrance and letrozole), GERD, depression, anxiety, OSA on CPAP, edema, who presents with abdominal pain. Pt states that she has been having abdominal pain in the past 5 days, which is new. Her abdominal pain is located in the right upper quadrant, constant, 5 out of 10 in severity, radiating to the right back. She also has nausea, but no vomiting. She has been having diarrhea in the past 4 days. She states that she was on stool softener, but she continues to have diarrhea after she stopped stool softer 3 days ago. She has 5-7 bowel movements with loose stool each day.  Pt seen for LOS (day 10). BMI indicates obesity. Diet changes as follows since admission: 10/29 @ 0930: FLD 10/29 @ 1207: Regular 11/1 @ 1120: NPO 11/1 @ [id number]: CLD 11/3 @ [id number]: FLD 11/6 @ 0001: NPO 11/6 @ 1430: CLD 11/7 @ [id number]: FLD  Pt consumed 100% of FLD for breakfast this AM without issue. She is s/p ERCP which led to acute pancreatitis which MD indicates is improving. Pt with hx of breast cancer s/p L mastectomy and is on oral chemo.   No muscle or fat wasting noted. No new weight since admission. PTA, pt's weight had been stable x1 month and prior to that she had gained 4 lbs in 1 month.  Medications reviewed; 1  tablet Os-cal/day, 1000 units vitamin D/day, PRN IV Zofran, 40 mg oral Protonix/day, 17 g Miralax BID, 2 tablets Senokot/day.  Labs reviewed; CBG: 97 mg/dL today, K: 3.4 mmol/L, BUN: <5 mg/dL, creatinine: 0.43 mg/dL, Ca: 8.7 mg/dL, LFTs elevated.  IVF: NS @ 100 mL/hr.    Diet Order:  Diet full liquid Room service appropriate? Yes; Fluid consistency: Thin  Skin:  Reviewed, no issues  Last BM:  11/5  Height:   Ht Readings from Last 1 Encounters:  09/09/16 5\' 2"  (1.575 m)    Weight:   Wt Readings from Last 1 Encounters:  09/09/16 213 lb 3 oz (96.7 kg)    Ideal Body Weight:  50 kg  BMI:  Body mass index is 38.99 kg/m.  Estimated Nutritional Needs:   Kcal:  1450-1740 (15-18 kcal/kg)  Protein:  65-75 grams (1.3-1.5 grams/kg)  Fluid:  >/= 1.7 L/day  EDUCATION NEEDS:   No education needs identified at this time    Jarome Matin, MS, RD, LDN Inpatient Clinical Dietitian Pager # [phone] After hours/weekend pager # [phone]

## 2016-09-19 NOTE — Progress Notes (Signed)
Daily Progress Note   Patient Name: Tina Vaughan       Date: 09/19/2016 DOB: January 13, 1961  Age: 55 y.o. MRN#: [id number] Attending Physician: Debbe Odea, MD Primary Care Physician: Elise Benne Admit Date: 09/08/2016  Reason for Consultation/Follow-up: Pain management   Subjective: Pt was ambulating back from the bathroom when I entered the room. Her best friend, Tina Vaughan, was at the bedside. Tina Vaughan endorsed improved pain control since her PRN Dilaudid was increased yesterday. She does continue to have some breakthrough pain, particularly as it approaches the two hour mark and when she wakes up overnight. Of note, she is also constipated with no BM for at least four days (per her report, nursing documented BM on 11/5).  Length of Stay: 10  Current Medications: Scheduled Meds:  . buPROPion  300 mg Oral Daily  . calcium carbonate  500 mg of elemental calcium Oral Daily  . cholecalciferol  1,000 Units Oral Daily  . citalopram  10 mg Oral Daily  . enoxaparin (LOVENOX) injection  40 mg Subcutaneous Q24H  . fentaNYL  75 mcg Transdermal Q72H  . gabapentin  300 mg Oral TID  . letrozole  2.5 mg Oral Daily  . LORazepam  1 mg Oral Daily  . LORazepam  2 mg Oral QHS  . nystatin  5 mL Oral QID  . ondansetron (ZOFRAN) IV  4 mg Intravenous Q6H  . pantoprazole  40 mg Oral Daily  . polyethylene glycol  17 g Oral BID  . senna  2 tablet Oral QHS  . zolpidem  5 mg Oral QHS    Continuous Infusions: . sodium chloride 100 mL/hr at 09/19/16 1212    PRN Meds: alum & mag hydroxide-simeth, fluocinonide cream, HYDROmorphone (DILAUDID) injection, ibuprofen, lidocaine-prilocaine, sodium chloride flush  Physical Exam  Constitutional: She is oriented to person, place, and time. She appears  well-developed.  HENT:  Head: Normocephalic and atraumatic.  Eyes: EOM are normal.  Neck: Normal range of motion.  Pulmonary/Chest: Effort normal. No respiratory distress.  Musculoskeletal: Normal range of motion.  Neurological: She is alert and oriented to person, place, and time.  Halting speech noted, slight short term memory deficit  Skin: Skin is warm and dry. There is pallor.  Psychiatric: She has a normal mood and affect. Her behavior is normal. Judgment and  thought content normal. She exhibits abnormal recent memory.            Vital Signs: BP 110/73 (BP Location: Right Arm)   Pulse (!) 107   Temp 98.5 F (36.9 C) (Oral)   Resp 16   Ht 5\' 2"  (1.575 m)   Wt 96.7 kg (213 lb 3 oz)   LMP 05/08/2016   SpO2 94%   BMI 38.99 kg/m  SpO2: SpO2: 94 % O2 Device: O2 Device: Not Delivered O2 Flow Rate: O2 Flow Rate (L/min): 2 L/min  Intake/output summary:   Intake/Output Summary (Last 24 hours) at 09/19/16 1500 Last data filed at 09/19/16 13/07/17  Gross per 24 hour  Intake             1680 ml  Output                0 ml  Net             1680 ml   LBM: Last BM Date: 09/17/16 Baseline Weight: Weight: 98.9 kg (218 lb) Most recent weight: Weight: 96.7 kg (213 lb 3 oz)       Palliative Assessment/Data: PPS 70%    Flowsheet Rows   Flowsheet Row Most Recent Value  Intake Tab  Referral Department  Hospitalist  Unit at Time of Referral  Oncology Unit  Palliative Care Primary Diagnosis  Cancer  Palliative Care Type  Return patient Palliative Care  Reason for referral  Pain  Date first seen by Palliative Care  09/14/16  Clinical Assessment  Palliative Performance Scale Score  40%  Pain Max last 24 hours  7  Pain Min Last 24 hours  4  Dyspnea Max Last 24 Hours  4  Dyspnea Min Last 24 hours  3  Nausea Max Last 24 Hours  3  Nausea Min Last 24 Hours  2  Anxiety Max Last 24 Hours  3  Psychosocial & Spiritual Assessment  Palliative Care Outcomes  Patient/Family meeting held?   Yes  Who was at the meeting?  patient who is decisional, sister was not in the room today   Palliative Care Outcomes  Improved pain interventions  Palliative Care follow-up planned  Yes, Facility      Patient Active Problem List   Diagnosis Date Noted  . Displacement of biliary stent   . Jaundice   . Abdominal pain, epigastric   . Constipation   . Common bile duct stricture   . 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 Acute pancreatitis without necrosis or infection, unspecified 09/15/2016  . Right upper quadrant abdominal pain   . Encounter for palliative care   . Depression 09/08/2016  . Abnormal liver function 09/08/2016  . Abdominal pain 09/08/2016  . Diarrhea 09/08/2016  . Speech impairment 03/09/2016  . Encounter for chemotherapy management 11/10/2015  . Bone metastasis (HCC) 11/02/2015  . Morbid obesity (HCC) 08/28/2015  . Shortness of breath 08/04/2015  . Dyspnea 08/04/2015  . Tachycardia 08/04/2015  . Hyperventilation 08/04/2015  . Respiratory rate increased 08/04/2015  . Chemotherapy induced nausea and vomiting 07/13/2015  . Nausea without vomiting 06/08/2015  . Special screening for malignant neoplasms, colon 06/08/2015  . Bloating 06/08/2015  . Early satiety 06/08/2015  . Pain in the abdomen 06/04/2015  . Generalized anxiety disorder 05/21/2015  . Fatigue 05/21/2015  . Sleep apnea 05/21/2015  . Headache 10/22/2014  . Lymphedema of upper extremity 09/11/2014  . Status post prophylactic bilateral salpingo-oophorectomy for BRCA 03/19/2012  . Breast cancer  of lower-outer quadrant of left female breast ([place]) 05/18/2011    Palliative Care Assessment & Plan   Patient Profile:   Assessment: Tina Vaughan is feeling better today after her pain medication was increased yesterday. Unfortunately, the Fentanyl patch was not increased to the 76mg (568m patch still on during my assessment). She is quite constipated. Her affect was slightly more flat today compared  to yesterday, however perked-up at the mention of possibly going outside tomorrow.   Recommendations/Plan:   Spoke with pharmacist to reschedule Fentanyl patch for this afternoon; will proceed with increased dose of 75 mcg  Continue IV Dilaudid 2 mg IV Q 2 hours prn  Bowel regimen in place,  Ordered for soap suds enema to facilitate BM today  Continue Zofran PRN and continue to monitor, denies nausea currently    Code Status:    Code Status Orders        Start     Ordered   09/08/16 2353  Do not attempt resuscitation (DNR)  Continuous    Question Answer Comment  In the event of cardiac or respiratory ARREST Do not call a "code blue"   In the event of cardiac or respiratory ARREST Do not perform Intubation, CPR, defibrillation or ACLS   In the event of cardiac or respiratory ARREST Use medication by any route, position, wound care, and other measures to relive pain and suffering. May use oxygen, suction and manual treatment of airway obstruction as needed for comfort.      09/08/16 2353    Code Status History    Date Active Date Inactive Code Status Order ID Comments User Context   09/08/2016  8:55 PM 09/08/2016 11:53 PM DNR [id number] BenderPA-C ED    Advance Directive Documentation   Flowsheet Row Most Recent Value  Type of Advance Directive  Healthcare Power of Attorney  Pre-existing out of facility DNR order (yellow form or pink MOST form)  Yellow form placed in chart (order not valid for inpatient use)  "MOST" Form in Place?  No data       Prognosis:   Unable to determine  Discharge Planning:  To Be Determined  Care plan was discussed with  Patient and her best friend SaLovey NewcomerThank you for allowing the Palliative Medicine Team to assist in the care of this patient.   Time In: 1450 Time Out: 1515 Total Time 25 Prolonged Time Billed  no       Greater than 50%  of this time was spent counseling and coordinating care related to the above assessment and  plan.  SaJannette FogoNP Please contact Palliative Medicine Team phone at [phone] questions and concerns.

## 2016-09-19 NOTE — Progress Notes (Signed)
Patient placed self on CPAP and was resting comfortable.

## 2016-09-20 ENCOUNTER — Inpatient Hospital Stay (HOSPITAL_COMMUNITY): Payer: Medicare Other

## 2016-09-20 DIAGNOSIS — K831 Obstruction of bile duct: Secondary | ICD-10-CM

## 2016-09-20 DIAGNOSIS — R1084 Generalized abdominal pain: Secondary | ICD-10-CM

## 2016-09-20 LAB — COMPREHENSIVE METABOLIC PANEL
ALK PHOS: 222 U/L — AB (ref 38–126)
ALT: 180 U/L — AB (ref 14–54)
AST: 124 U/L — AB (ref 15–41)
Albumin: 2.8 g/dL — ABNORMAL LOW (ref 3.5–5.0)
Anion gap: 6 (ref 5–15)
BILIRUBIN TOTAL: 6 mg/dL — AB (ref 0.3–1.2)
CALCIUM: 8.8 mg/dL — AB (ref 8.9–10.3)
CO2: 30 mmol/L (ref 22–32)
CREATININE: 0.42 mg/dL — AB (ref 0.44–1.00)
Chloride: 103 mmol/L (ref 101–111)
GFR calc Af Amer: >60 mL/min (ref >60)
GLUCOSE: 114 mg/dL — AB (ref 65–99)
POTASSIUM: 3.5 mmol/L (ref 3.5–5.1)
Sodium: 139 mmol/L (ref 135–145)
TOTAL PROTEIN: 5.8 g/dL — AB (ref 6.5–8.1)

## 2016-09-20 LAB — GLUCOSE, CAPILLARY: GLUCOSE-CAPILLARY: 117 mg/dL — AB (ref 65–99)

## 2016-09-20 MED ORDER — POTASSIUM CHLORIDE CRYS ER 20 MEQ PO TBCR
40.0000 meq | EXTENDED_RELEASE_TABLET | Freq: Once | ORAL | Status: AC
  Administered 2016-09-20: 40 meq via ORAL
  Filled 2016-09-20: qty 2

## 2016-09-20 MED ORDER — MAGNESIUM CITRATE PO SOLN
1.0000 | Freq: Once | ORAL | Status: AC
  Administered 2016-09-20: 1 via ORAL
  Filled 2016-09-20: qty 296

## 2016-09-20 MED ORDER — POLYETHYLENE GLYCOL 3350 17 G PO PACK
34.0000 g | PACK | Freq: Two times a day (BID) | ORAL | Status: DC
  Administered 2016-09-21 (×2): 34 g via ORAL
  Filled 2016-09-20 (×3): qty 2

## 2016-09-20 NOTE — Progress Notes (Signed)
Pt prefers self placement when ready for bed.  RT to monitor and assess as needed.

## 2016-09-20 NOTE — Progress Notes (Signed)
Daily Progress Note   Patient Name: Tina Vaughan       Date: 09/20/2016 DOB: 04/26/1961  Age: 55 y.o. MRN#: [id number] Attending Physician: Albertine Patricia, MD Primary Care Physician: Elise Benne Admit Date: 09/08/2016  Reason for Consultation/Follow-up: Pain management   Subjective: Patient resting in bed on arrival.  Reports pain remains better controlled on current regimen.  She had fentanyl patch increased last evening.  She has still been using rescue medication every 2-3 hours.  Had 2 small bowel movements following enema yesterday.  Still feels backed up. Reports that she does not feel she has been passing gas.  Tolerating full liquid diet and is hoping to advance diet.  Length of Stay: 11  Current Medications: Scheduled Meds:  . buPROPion  300 mg Oral Daily  . calcium carbonate  500 mg of elemental calcium Oral Daily  . cholecalciferol  1,000 Units Oral Daily  . citalopram  10 mg Oral Daily  . enoxaparin (LOVENOX) injection  40 mg Subcutaneous Q24H  . fentaNYL  75 mcg Transdermal Q72H  . gabapentin  300 mg Oral TID  . letrozole  2.5 mg Oral Daily  . LORazepam  1 mg Oral Daily  . LORazepam  2 mg Oral QHS  . nystatin  5 mL Oral QID  . ondansetron (ZOFRAN) IV  4 mg Intravenous Q6H  . pantoprazole  40 mg Oral Daily  . polyethylene glycol  17 g Oral BID  . senna  2 tablet Oral QHS  . zolpidem  5 mg Oral QHS    Continuous Infusions: . sodium chloride 100 mL/hr at 09/19/16 2243    PRN Meds: alum & mag hydroxide-simeth, fluocinonide cream, HYDROmorphone (DILAUDID) injection, ibuprofen, lidocaine-prilocaine, sodium chloride flush  Physical Exam  Constitutional: She is oriented to person, place, and time. She appears well-developed.  HENT:  Head: Normocephalic  and atraumatic.  Eyes: EOM are normal.  Neck: Normal range of motion.  Pulmonary/Chest: Effort normal. No respiratory distress.  Musculoskeletal: Normal range of motion.  Neurological: She is alert and oriented to person, place, and time.  Halting speech noted, slight short term memory deficit  Skin: Skin is warm and dry. There is pallor.  Psychiatric: She has a normal mood and affect. Her behavior is normal. Judgment and thought content normal. She exhibits abnormal  recent memory.            Vital Signs: BP 128/89 (BP Location: Left Arm)   Pulse 72   Temp 97.6 F (36.4 C) (Oral)   Resp 18   Ht '5\' 2"'$  (1.575 m)   Wt 99 kg (218 lb 4.1 oz)   LMP 05/08/2016   SpO2 100%   BMI 39.92 kg/m  SpO2: SpO2: 100 % O2 Device: O2 Device: Not Delivered O2 Flow Rate: O2 Flow Rate (L/min): 2 L/min  Intake/output summary:   Intake/Output Summary (Last 24 hours) at 09/20/16 0933 Last data filed at 09/20/16 0800  Gross per 24 hour  Intake          3006.66 ml  Output                0 ml  Net          3006.66 ml   LBM: Last BM Date: 09/19/16 Baseline Weight: Weight: 98.9 kg (218 lb) Most recent weight: Weight: 99 kg (218 lb 4.1 oz)       Palliative Assessment/Data: PPS 70%    Flowsheet Rows   Flowsheet Row Most Recent Value  Intake Tab  Referral Department  Hospitalist  Unit at Time of Referral  Oncology Unit  Palliative Care Primary Diagnosis  Cancer  Palliative Care Type  Return patient Palliative Care  Reason for referral  Pain  Date first seen by Palliative Care  09/14/16  Clinical Assessment  Palliative Performance Scale Score  40%  Pain Max last 24 hours  7  Pain Min Last 24 hours  4  Dyspnea Max Last 24 Hours  4  Dyspnea Min Last 24 hours  3  Nausea Max Last 24 Hours  3  Nausea Min Last 24 Hours  2  Anxiety Max Last 24 Hours  3  Psychosocial & Spiritual Assessment  Palliative Care Outcomes  Patient/Family meeting held?  Yes  Who was at the meeting?  patient who is  decisional, sister was not in the room today   Palliative Care Outcomes  Improved pain interventions  Palliative Care follow-up planned  Yes, Facility      Patient Active Problem List   Diagnosis Date Noted  . Displacement of biliary stent   . Jaundice   . Abdominal pain, epigastric   . Constipation   . Common bile duct stricture   . 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 Acute pancreatitis without necrosis or infection, unspecified 09/15/2016  . Right upper quadrant abdominal pain   . Encounter for palliative care   . Depression 09/08/2016  . Abnormal liver function 09/08/2016  . Abdominal pain 09/08/2016  . Diarrhea 09/08/2016  . Speech impairment 03/09/2016  . Encounter for chemotherapy management 11/10/2015  . Bone metastasis ([place]) 11/02/2015  . Morbid obesity ([place]) 08/28/2015  . Shortness of breath 08/04/2015  . Dyspnea 08/04/2015  . Tachycardia 08/04/2015  . Hyperventilation 08/04/2015  . Respiratory rate increased 08/04/2015  . Chemotherapy induced nausea and vomiting 07/13/2015  . Nausea without vomiting 06/08/2015  . Special screening for malignant neoplasms, colon 06/08/2015  . Bloating 06/08/2015  . Early satiety 06/08/2015  . Pain in the abdomen 06/04/2015  . Generalized anxiety disorder 05/21/2015  . Fatigue 05/21/2015  . Sleep apnea 05/21/2015  . Headache 10/22/2014  . Lymphedema of upper extremity 09/11/2014  . Status post prophylactic bilateral salpingo-oophorectomy for BRCA 03/19/2012  . Breast cancer of lower-outer quadrant of left female breast ([place]) 05/18/2011    Palliative  Care Assessment & Plan   Patient Profile:   Assessment: [name] is feeling better today after her pain medication was increased yesterday. Still denies effective bowel movement.  Recommendations/Plan:  Pain: Increased dose of 75 mcg last evening.  It will take another 12-24 hours to assess effectiveness of this increase.  I anticipate that we will need to increase  further moving forward, but would not increase prior to her having current dose patch for at least 48 hours.  Continue IV Dilaudid 2 mg IV Q 2 hours prn. I am hopeful that her need for rescue medication will decrease as higher dose of fentanyl reaches steady state.  Bowel regimen in place,  Documented small BM following enema.  She reports today that she does not recall passing gas.  I doubt she has obstruction, but consider flat plate to assess stool burden.  Continue Zofran PRN and continue to monitor, denies nausea currently    Code Status:    Code Status Orders        Start     Ordered   09/08/16 2353  Do not attempt resuscitation (DNR)  Continuous    Question Answer Comment  In the event of cardiac or respiratory ARREST Do not call a "code blue"   In the event of cardiac or respiratory ARREST Do not perform Intubation, CPR, defibrillation or ACLS   In the event of cardiac or respiratory ARREST Use medication by any route, position, wound care, and other measures to relive pain and suffering. May use oxygen, suction and manual treatment of airway obstruction as needed for comfort.      09/08/16 2353    Code Status History    Date Active Date Inactive Code Status Order ID Comments User Context   09/08/2016  8:55 PM 09/08/2016 11:53 PM DNR [id number]  Lorayne Bender, PA-C ED    Advance Directive Documentation   Flowsheet Row Most Recent Value  Type of Advance Directive  Healthcare Power of Attorney  Pre-existing out of facility DNR order (yellow form or pink MOST form)  Yellow form placed in chart (order not valid for inpatient use)  "MOST" Form in Place?  No data       Prognosis:   Unable to determine  Discharge Planning:  To Be Determined  Care plan was discussed with  Patient  Thank you for allowing the Palliative Medicine Team to assist in the care of this patient.   Time In: 0830 Time Out: 0900 Total Time 30 Prolonged Time Billed  no       Greater than 50%  of  this time was spent counseling and coordinating care related to the above assessment and plan.  Micheline Rough, MD Please contact Palliative Medicine Team phone at [phone] for questions and concerns.

## 2016-09-20 NOTE — Progress Notes (Signed)
[facility]enterology Progress Note  Tina Vaughan 55 y.o. 13-Aug-1961   Subjective: Intermittent abdominal pain. Denies nausea or vomiting. Tolerating full liquids.  Objective: Vital signs in last 24 hours: Vitals:   09/19/16 2122 09/20/16 0409  BP: (!) 134/91 128/89  Pulse: 94 72  Resp: 18 18  Temp: 98.8 F (37.1 C) 97.6 F (36.4 C)    Physical Exam: Gen: lethargic, no acute distress CV: RRR Chest: CTA B Abd: epigastric and RUQ tenderness with guarding, soft, nondistended, +BS Ext: no edema  Lab Results:  Recent Labs  09/19/16 0526 09/20/16 0542  NA 138 139  K 3.4* 3.5  CL 103 103  CO2 27 30  GLUCOSE 110* 114*  BUN <5* <5*  CREATININE 0.43* 0.42*  CALCIUM 8.7* 8.8*    Recent Labs  09/19/16 0526 09/20/16 0542  AST 140* 124*  ALT 192* 180*  ALKPHOS 217* 222*  BILITOT 8.0* 6.0*  PROT 6.5 5.8*  ALBUMIN 3.0* 2.8*    Recent Labs  09/19/16 0526  WBC 4.5  NEUTROABS 3.1  HGB 10.8*  HCT 32.4*  MCV 97.3  PLT 301   No results for input(s): LABPROT, INR in the last 72 hours.    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 Would recheck tomorrow and if LFTs continuing to decrease and can tolerate solid food then would d/c tomorrow with close f/u of LFTs. Soft diet started. Supportive care. Will follow.   [place] C. 09/20/2016, 3:04 PM  Pager [phone]  If no answer or after 5 PM call [phone] ID: Tina Vaughan, female   DOB: 14-Oct-1961, 55 y.o.   MRN: [id number]

## 2016-09-20 NOTE — Progress Notes (Signed)
PROGRESS NOTE    Tina Vaughan  [id number] DOB: Jun 04, 1961 DOA: 09/08/2016  PCP: Mackie Pai, PA-C   Brief Narrative:  55 y.o.femalewith medical history significant of metastatic breast cancer (s/p of surgery and currently on Ibrance and letrozole), GERD, depression, anxiety, OSA on CPAP, edema, who presents with abdominal pain.  Pt states that she has been having abdominal pain in the past 5 days, which is new. Her abdominal pain is located in the right upper quadrant, constant, 5 out of 10 in severity, radiating to the right back. She also hasnausea, but no vomiting. She has been having diarrhea in the past 4 days. She states that she was on stool softener, but she continues to have diarrhea after she stopped stool softer 3 days ago. She has 5-7 bowel movementswith loose stool each day, light colored stool.  No fever or chills. UA+ Patient denies symptoms of UTI.  She was found to have elevated LFTs in the ER. CT revealed periportal adenopathy. ERCP on 11/1 performed- CBD stent place for what looked like external compression of CBD. LFTs did not improve and therefore repeat ERCP performed 11/6 with removal of prior stent which was noted to be almost out of the CBD and insertion of a new stent. CBD stricture noted. Brushings from 11/1 unrevealing.    Subjective: Abdominal pain is better controlled today.  No nausea. Reports to bowel movement after enema yesterday, Urine still very dark.   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 - CT abd with fatty liver and periportal adenopathy  - Abd [name] with fatty liver   - MRCP demonstrated suspicion for CBD obstruction -  ERCP shows biliary stricture suspected to be from external compression (from lymph nodes) which was stented. She developed post ERCP acute pancreatitis which has resolved.  LFT's did  not improve therefore, ERCP repeated 11/6- stent was nearly out of CBD and was replaced with a metal stent- a CBD stricture was seen - Total bili trending down,8>6. - Diet recommendation per GI  Acute pancreatitis - due to ERCP-   - improving -  Continue diet per GI  Breast cancer of lower-outer quadrant of left female breast with bone mets and periportal lymphadenopathy- chronic pain -Patient is s/p mastectomy -Seen by Dr. Lindi Adie -Patient is continued on Ibrance and letrozole - appreciate palliative care assistance with pain management  Depression and anxiety: -Mood seems stable at present -Will continue Wellbutrin, Celexa and Ativan as tolerated  Recent Diarrhea/ grey stools -No further diarrhea noted, actually patient is constipated, will give some laxatives  Positive urinalysis:  -Initial UA was suggestive of possible UTI -Patient is now s/p fosfomycin  -Urine culture with multiple species present -Afebrile  Fatty liver - noted on imaging    DVT prophylaxis: Lovenox Code Status: DNR Family Communication:  Disposition Plan: home when stable Consultants:   GI  Palliative care Procedures:   ERCP 11/1 with stent placement, repeat ERCP 11/6 with metal stent placement Antimicrobials:  Anti-infectives    Start     Dose/Rate Route Frequency Ordered Stop   09/13/16 1145  ciprofloxacin (CIPRO) IVPB 400 mg     400 mg 200 mL/hr over 60 Minutes Intravenous  Once 09/13/16 1137 09/13/16 1154       Objective: Vitals:   09/19/16 0633 09/19/16 1317 09/19/16 2122 09/20/16 0409  BP: 128/80 110/73 (!) 134/91 128/89  Pulse: 90 (!) 107 94 72  Resp: 16 16 18 18   Temp: 97.5 F (36.4 C) 98.5 F (36.9 C) 98.8 F (37.1 C) 97.6 F (36.4 C)  TempSrc: Oral Oral Oral Oral  SpO2: 96% 94% 91% 100%  Weight:    99 kg (218 lb 4.1 oz)  Height:        Intake/Output Summary (Last 24 hours) at 09/20/16 1125 Last data filed at 09/20/16 0800  Gross per 24 hour  Intake           3006.66 ml  Output                0 ml  Net          3006.66 ml   Filed Weights   09/08/16 1843 09/09/16 0127 09/20/16 0409  Weight: 98.9 kg (218 lb) 96.7 kg (213 lb 3 oz) 99 kg (218 lb 4.1 oz)    Examination: General exam: Appears comfortable  HEENT: PERRLA, oral mucosa moist, Mildly sclera icterus no thrush Respiratory system: Clear to auscultation. Respiratory effort normal. Cardiovascular system: S1 & S2 heard, RRR.  No murmurs  Gastrointestinal system: Abdomen soft,  Mildly tender in epigastrium, nondistended. Normal bowel sound. No organomegaly Central nervous system: Alert and oriented. No focal neurological deficits. Extremities: No cyanosis, clubbing or edema Skin: No rashes or ulcers Psychiatry:  Mood & affect appropriate.     Data Reviewed: I have personally reviewed following labs and imaging studies  CBC:  Recent Labs Lab 09/14/16 0503 09/16/16 0500 09/19/16 0526  WBC 4.6 5.4 4.5  NEUTROABS 3.7 4.1 3.1  HGB 13.0 11.5* 10.8*  HCT 37.7 33.6* 32.4*  MCV 94.0 95.7 97.3  PLT 153 171 [id number]   Basic Metabolic Panel:  Recent Labs Lab 09/16/16 0500 09/17/16 0555 09/18/16 0438 09/19/16 0526 09/20/16 0542  NA 136 138 138 138 139  K 3.8 3.6 3.6 3.4* 3.5  CL 102 104 104 103 103  CO2 26 26 27 27 30   GLUCOSE 111* 109* 105* 110* 114*  BUN 6 <5* <5* <5* <5*  CREATININE 0.40* 0.41* 0.48 0.43* 0.42*  CALCIUM 8.9 8.9 9.1 8.7* 8.8*   GFR: Estimated Creatinine Clearance: 88.5 mL/min (by C-G formula based on SCr of 0.42 mg/dL (L)). Liver Function Tests:  Recent Labs Lab 09/16/16 0500 09/17/16 0555 09/18/16 0438 09/19/16 0526 09/20/16 0542  AST 140* 149* 179* 140* 124*  ALT 178* 194* 216* 192* 180*  ALKPHOS 177* 178* 191* 217* 222*  BILITOT 7.5* 7.9* 7.7* 8.0* 6.0*  PROT 6.4* 6.4* 6.8 6.5 5.8*  ALBUMIN 3.1* 3.1* 3.1* 3.0* 2.8*    Recent Labs Lab 09/14/16 0503 09/15/16 0625 09/16/16 0500  LIPASE 3,006* 1,326* 362*   No results for input(s): AMMONIA  in the last 168 hours. Coagulation Profile: No results for input(s): INR, PROTIME in the last 168 hours. Cardiac Enzymes: No results for input(s): CKTOTAL, CKMB, CKMBINDEX, TROPONINI in the last 168 hours. BNP (last 3 results) No results for input(s): PROBNP in the last 8760 hours. HbA1C: No results for input(s): HGBA1C in the last 72 hours. CBG:  Recent Labs Lab 09/16/16 0731 09/17/16 0735 09/18/16 0738 09/19/16 0746 09/20/16 0703  GLUCAP 105* 99 103* 97 117*   Lipid Profile: No results for input(s): CHOL, HDL, LDLCALC, TRIG, CHOLHDL, LDLDIRECT in the last 72 hours. Thyroid Function Tests: No results for input(s): TSH, T4TOTAL, FREET4, T3FREE, THYROIDAB in the last 72 hours. Anemia Panel: No results for input(s): VITAMINB12, FOLATE, FERRITIN, TIBC, IRON, RETICCTPCT in  the last 72 hours. Urine analysis:    Component Value Date/Time   COLORURINE ORANGE (A) 09/08/2016 2000   APPEARANCEUR CLEAR 09/08/2016 2000   LABSPEC 1.019 09/08/2016 2000   LABSPEC 1.015 01/18/2011 1144   PHURINE 6.5 09/08/2016 2000   GLUCOSEU NEGATIVE 09/08/2016 2000   HGBUR TRACE (A) 09/08/2016 2000   BILIRUBINUR LARGE (A) 09/08/2016 2000   BILIRUBINUR 1 06/04/2015 1122   BILIRUBINUR Negative 01/18/2011 1144   KETONESUR NEGATIVE 09/08/2016 2000   PROTEINUR NEGATIVE 09/08/2016 2000   UROBILINOGEN 0.2 06/04/2015 2235   NITRITE POSITIVE (A) 09/08/2016 2000   LEUKOCYTESUR SMALL (A) 09/08/2016 2000   LEUKOCYTESUR Trace 01/18/2011 1144   Sepsis Labs: @LABRCNTIP (procalcitonin:4,lacticidven:4) ) No results found for this or any previous visit (from the past 240 hour(s)).       Radiology Studies: Dg Abd 1 View  Result Date: 09/20/2016 CLINICAL DATA:  Constipation for 2 days with abdominal pain EXAM: ABDOMEN - 1 VIEW COMPARISON:  09/15/2016 FINDINGS: Scattered large and small bowel gas is noted. Contrast material is noted within the colon from priors examination. Mild retained fecal material is noted  which may represent some mild constipation. Multiple calcified uterine fibroids are seen. Vicarious excretion of contrast into the gallbladder is noted. The biliary stent is better positioned and more expanded than on the prior exam. No bony abnormality is seen. IMPRESSION: Mild constipation.  No obstructive changes are noted. Electronically Signed   By: Inez Catalina M.D.   On: 09/20/2016 11:01   Dg Ercp  Result Date: 09/18/2016 CLINICAL DATA:  Jaundice.  Stent exchange. EXAM: ERCP TECHNIQUE: Multiple spot images obtained with the fluoroscopic device and submitted for interpretation post-procedure. FLUOROSCOPY TIME:  Fluoroscopy Time:  2 minutes and 17 seconds Radiation Exposure Index (if provided by the fluoroscopic device): 78.4 mGy COMPARISON:  09/13/2016 FINDINGS: The metallic stent was removed. Wire advanced into the central intrahepatic ducts. There is contrast in the intrahepatic ducts. A new metallic biliary stent was placed. Again noted is narrowing in the distal common bile duct region. IMPRESSION: Biliary stent exchange. These images were submitted for radiologic interpretation only. Please see the procedural report for the amount of contrast and the fluoroscopy time utilized. Electronically Signed   By: Markus Daft M.D.   On: 09/18/2016 12:50      Scheduled Meds: . buPROPion  300 mg Oral Daily  . calcium carbonate  500 mg of elemental calcium Oral Daily  . cholecalciferol  1,000 Units Oral Daily  . citalopram  10 mg Oral Daily  . enoxaparin (LOVENOX) injection  40 mg Subcutaneous Q24H  . fentaNYL  75 mcg Transdermal Q72H  . gabapentin  300 mg Oral TID  . letrozole  2.5 mg Oral Daily  . LORazepam  1 mg Oral Daily  . LORazepam  2 mg Oral QHS  . magnesium citrate  1 Bottle Oral Once  . nystatin  5 mL Oral QID  . ondansetron (ZOFRAN) IV  4 mg Intravenous Q6H  . pantoprazole  40 mg Oral Daily  . polyethylene glycol  34 g Oral BID  . senna  2 tablet Oral QHS  . zolpidem  5 mg Oral QHS     Continuous Infusions: . sodium chloride 100 mL/hr at 09/20/16 0936     LOS: 11 days     Phillips Climes, MD Pager [phone] [facility] Pager: [email or web address] Password [email or web address] 09/20/2016, 11:25 AM

## 2016-09-21 DIAGNOSIS — K5903 Drug induced constipation: Secondary | ICD-10-CM

## 2016-09-21 DIAGNOSIS — T402X5A Adverse effect of other opioids, initial encounter: Secondary | ICD-10-CM

## 2016-09-21 LAB — COMPREHENSIVE METABOLIC PANEL
ALBUMIN: 3 g/dL — AB (ref 3.5–5.0)
ALK PHOS: 225 U/L — AB (ref 38–126)
ALT: 153 U/L — AB (ref 14–54)
AST: 85 U/L — AB (ref 15–41)
Anion gap: 7 (ref 5–15)
CALCIUM: 8.7 mg/dL — AB (ref 8.9–10.3)
CO2: 30 mmol/L (ref 22–32)
CREATININE: 0.47 mg/dL (ref 0.44–1.00)
Chloride: 101 mmol/L (ref 101–111)
GFR calc Af Amer: >60 mL/min (ref >60)
GFR calc non Af Amer: >60 mL/min (ref >60)
GLUCOSE: 129 mg/dL — AB (ref 65–99)
Potassium: 3.5 mmol/L (ref 3.5–5.1)
SODIUM: 138 mmol/L (ref 135–145)
Total Bilirubin: 4.3 mg/dL — ABNORMAL HIGH (ref 0.3–1.2)
Total Protein: 6.2 g/dL — ABNORMAL LOW (ref 6.5–8.1)

## 2016-09-21 LAB — GLUCOSE, CAPILLARY: Glucose-Capillary: 107 mg/dL — ABNORMAL HIGH (ref 65–99)

## 2016-09-21 MED ORDER — OXYCODONE HCL 5 MG PO TABS: 30.0000 mg | ORAL_TABLET | ORAL | Status: DC | PRN

## 2016-09-21 MED ORDER — HYDROMORPHONE HCL 1 MG/ML IJ SOLN: 1.0000 mg | INTRAMUSCULAR | Status: DC | PRN

## 2016-09-21 MED ORDER — OXYCODONE HCL 5 MG PO TABS
30.0000 mg | ORAL_TABLET | ORAL | Status: DC | PRN
  Administered 2016-09-21 – 2016-09-22 (×4): 30 mg via ORAL
  Filled 2016-09-21 (×4): qty 6

## 2016-09-21 MED ORDER — OXYCODONE HCL 5 MG PO TABS: 15.0000 mg | ORAL_TABLET | ORAL | Status: DC | PRN

## 2016-09-21 NOTE — Progress Notes (Signed)
Daily Progress Note   Patient Name: Tina Vaughan       Date: 09/21/2016 DOB: [birth year]  Age: 55 y.o. MRN#: [id number] Attending Physician: Albertine Patricia, MD Primary Care Physician: Elise Benne Admit Date: 09/08/2016  Reason for Consultation/Follow-up: Pain management   Subjective: Patient resting in bed on arrival.  Reports pain remains better controlled on current regimen.  She had fentanyl patch increased approximately 36 hours ago.  She has still been using rescue medication every 2-3 hours.  She reports that her pain remains well controlled on current regimen.  Current pain is 3/10.  We discussed plan to transition to oxycodone for breakthrough pain medication. She reports that she will be able to get this medication on discharge, however, she does pay for her medication out of pocket and this has been financially difficult for her.  Had BM following mag citrate.  Discussed need to continue bowel regimen on discharge.    She and her friend were also asking for clarification on hospice and when she may benefit from this service.  We discussed the role of hospice and how their services could benefit her if the time comes that she is no longer benefiting from further disease modifying therapy and the goals is focused more on aggressive symptom management.    Length of Stay: 12  Current Medications: Scheduled Meds:  . buPROPion  300 mg Oral Daily  . calcium carbonate  500 mg of elemental calcium Oral Daily  . cholecalciferol  1,000 Units Oral Daily  . citalopram  10 mg Oral Daily  . enoxaparin (LOVENOX) injection  40 mg Subcutaneous Q24H  . fentaNYL  75 mcg Transdermal Q72H  . gabapentin  300 mg Oral TID  . letrozole  2.5 mg Oral Daily  . LORazepam  1 mg Oral Daily  .  LORazepam  2 mg Oral QHS  . nystatin  5 mL Oral QID  . ondansetron (ZOFRAN) IV  4 mg Intravenous Q6H  . pantoprazole  40 mg Oral Daily  . polyethylene glycol  34 g Oral BID  . senna  2 tablet Oral QHS  . zolpidem  5 mg Oral QHS    Continuous Infusions: . sodium chloride 1,000 mL (09/20/16 2351)    PRN Meds: alum & mag hydroxide-simeth, fluocinonide cream, HYDROmorphone (DILAUDID) injection, ibuprofen,  lidocaine-prilocaine, oxyCODONE, sodium chloride flush  Physical Exam  Constitutional: She is oriented to person, place, and time. She appears well-developed.  HENT:  Head: Normocephalic and atraumatic.  Eyes: EOM are normal.  Neck: Normal range of motion.  Pulmonary/Chest: Effort normal. No respiratory distress.  Musculoskeletal: Normal range of motion.  Neurological: She is alert and oriented to person, place, and time.  Halting speech noted, slight short term memory deficit  Skin: Skin is warm and dry. There is pallor.  Psychiatric: She has a normal mood and affect. Her behavior is normal. Judgment and thought content normal. She exhibits abnormal recent memory.            Vital Signs: BP 130/85 (BP Location: Right Arm)   Pulse 100   Temp 99 F (37.2 C) (Oral)   Resp 16   Ht '5\' 2"'$  (1.575 m)   Wt 99 kg (218 lb 4.1 oz)   LMP 05/08/2016 Comment: pt signed preg test waiver today 09/20/16  SpO2 93%   BMI 39.92 kg/m  SpO2: SpO2: 93 % O2 Device: O2 Device: Not Delivered O2 Flow Rate: O2 Flow Rate (L/min): 2 L/min  Intake/output summary:   Intake/Output Summary (Last 24 hours) at 09/21/16 2993 Last data filed at 09/21/16 7169  Gross per 24 hour  Intake             1840 ml  Output                0 ml  Net             1840 ml   LBM: Last BM Date: 09/20/16 Baseline Weight: Weight: 98.9 kg (218 lb) Most recent weight: Weight: 99 kg (218 lb 4.1 oz)       Palliative Assessment/Data: PPS 70%    Flowsheet Rows   Flowsheet Row Most Recent Value  Intake Tab  Referral  Department  Hospitalist  Unit at Time of Referral  Oncology Unit  Palliative Care Primary Diagnosis  Cancer  Palliative Care Type  Return patient Palliative Care  Reason for referral  Pain  Date first seen by Palliative Care  09/14/16  Clinical Assessment  Palliative Performance Scale Score  40%  Pain Max last 24 hours  7  Pain Min Last 24 hours  4  Dyspnea Max Last 24 Hours  4  Dyspnea Min Last 24 hours  3  Nausea Max Last 24 Hours  3  Nausea Min Last 24 Hours  2  Anxiety Max Last 24 Hours  3  Psychosocial & Spiritual Assessment  Palliative Care Outcomes  Patient/Family meeting held?  Yes  Who was at the meeting?  patient who is decisional, sister was not in the room today   Palliative Care Outcomes  Improved pain interventions  Palliative Care follow-up planned  Yes, Facility      Patient Active Problem List   Diagnosis Date Noted  . Biliary obstruction   . Displacement of biliary stent   . Jaundice   . Abdominal pain, epigastric   . Constipation   . Common bile duct stricture   . 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 Acute pancreatitis without necrosis or infection, unspecified 09/15/2016  . Right upper quadrant abdominal pain   . Encounter for palliative care   . Depression 09/08/2016  . Abnormal liver function 09/08/2016  . Abdominal pain 09/08/2016  . Diarrhea 09/08/2016  . Speech impairment 03/09/2016  . Encounter for chemotherapy management 11/10/2015  . Bone metastasis ([place]) 11/02/2015  .  Morbid obesity ([place]) 08/28/2015  . Shortness of breath 08/04/2015  . Dyspnea 08/04/2015  . Tachycardia 08/04/2015  . Hyperventilation 08/04/2015  . Respiratory rate increased 08/04/2015  . Chemotherapy induced nausea and vomiting 07/13/2015  . Nausea without vomiting 06/08/2015  . Special screening for malignant neoplasms, colon 06/08/2015  . Bloating 06/08/2015  . Early satiety 06/08/2015  . Pain in the abdomen 06/04/2015  . Generalized anxiety disorder  05/21/2015  . Fatigue 05/21/2015  . Sleep apnea 05/21/2015  . Headache 10/22/2014  . Lymphedema of upper extremity 09/11/2014  . Status post prophylactic bilateral salpingo-oophorectomy for BRCA 03/19/2012  . Breast cancer of lower-outer quadrant of left female breast ([place]) 05/18/2011    Palliative Care Assessment & Plan   Patient Profile:   Assessment: Ms. Beauchamp is feeling better today after her pain medication was increased yesterday. Still denies effective bowel movement.  Recommendations/Plan:  Pain: Increased dose of 75 mcg on 11/7.  I anticipate that we may need to increase further moving forward, but as she has acute pain related to her abdomen that is driving her pain rather than this being progression of her long standing cancer pain (has been predominantly back pain).  Therefore, plan to be more aggressive with short acting regimen over the next several days to see if her pain does continue to decrease.  She will need short-term follow-up to continue to titrate her medications.  Plan for trial of oxycodone '30mg'$  for breakthrough medication.  She also has second line IV pain medication available to be given 60 minutes after oral route if oral route is ineffective.  Constipation, opioid related: Bowel regimen in place. Had BM following mag citrate.  Continue Zofran PRN and continue to monitor, denies nausea currently    Code Status:    Code Status Orders        Start     Ordered   09/08/16 2353  Do not attempt resuscitation (DNR)  Continuous    Question Answer Comment  In the event of cardiac or respiratory ARREST Do not call a "code blue"   In the event of cardiac or respiratory ARREST Do not perform Intubation, CPR, defibrillation or ACLS   In the event of cardiac or respiratory ARREST Use medication by any route, position, wound care, and other measures to relive pain and suffering. May use oxygen, suction and manual treatment of airway obstruction as needed for  comfort.      09/08/16 2353    Code Status History    Date Active Date Inactive Code Status Order ID Comments User Context   09/08/2016  8:55 PM 09/08/2016 11:53 PM DNR [id number]  Lorayne Bender, PA-C ED    Advance Directive Documentation   Flowsheet Row Most Recent Value  Type of Advance Directive  Healthcare Power of Attorney  Pre-existing out of facility DNR order (yellow form or pink MOST form)  Yellow form placed in chart (order not valid for inpatient use)  "MOST" Form in Place?  No data       Prognosis:   Unable to determine  Discharge Planning:  To Be Determined  Care plan was discussed with  Patient  Thank you for allowing the Palliative Medicine Team to assist in the care of this patient.   Time In: 0940 Time Out: 1010 Total Time 30 Prolonged Time Billed  no       Greater than 50%  of this time was spent counseling and coordinating care related to the above assessment and plan.  Micheline Rough, MD Please contact Palliative Medicine Team phone at [phone] for questions and concerns.

## 2016-09-21 NOTE — Progress Notes (Signed)
Pt prefers self placement when ready for bed.  RT to monitor and assess as needed.

## 2016-09-21 NOTE — Progress Notes (Signed)
PROGRESS NOTE    Tina Vaughan  [id number] DOB: Jan 22, 1961 DOA: 09/08/2016  PCP: Mackie Pai, PA-C   Brief Narrative:  55 y.o.femalewith medical history significant of metastatic breast cancer (s/p of surgery and currently on Ibrance and letrozole), GERD, depression, anxiety, OSA on CPAP, edema, who presents with abdominal pain.  No fever or chills. UA+ Patient denies symptoms of UTI.  She was found to have elevated LFTs in the ER. CT revealed periportal adenopathy. ERCP on 11/1 performed- CBD stent place for what looked like external compression of CBD. LFTs did not improve and therefore repeat ERCP performed 11/6 with removal of prior stent which was noted to be almost out of the CBD and insertion of a new stent. CBD stricture noted. Brushings from 11/1 unrevealing.    Subjective: Abdominal pain is better controlled today.  No nausea. 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 - CT abd with fatty liver and periportal adenopathy  - Abd [name] with fatty liver   - MRCP demonstrated suspicion for CBD obstruction -  ERCP shows biliary stricture suspected to be from external compression (from lymph nodes) which was stented. She developed post ERCP acute pancreatitis which has resolved.  LFT's did not improve therefore, ERCP repeated 11/6- stent was nearly out of CBD and was replaced with a metal stent- a CBD stricture was seen - Total bili trending down,8>6>4.3, tolerating soft diet, discussed with GI Dr. Michail Sermon, patient can be discharged with a follow-up with Dr. Watt Climes as  outpatient. - Patient still with significant IV pain requirement overnight, required IV Dilaudid 20 mg, management per palliative medicine, and to transition to by mouth regimen and monitor overnight, and  discharged in a.m. if pain is controlled on oral regimen.  Acute pancreatitis - due to ERCP-   - improving -  Continue diet per GI  Breast cancer of lower-outer quadrant of left female breast with bone mets and periportal lymphadenopathy- chronic pain -Patient is s/p mastectomy -Seen by Dr. Lindi Adie -Patient is continued on Ibrance and letrozole - appreciate palliative care assistance with pain management  Depression and anxiety: -Mood seems stable at present -Will continue Wellbutrin, Celexa and Ativan as tolerated  Recent Diarrhea/ grey stools -No further diarrhea noted, actually patient is constipated, will give some laxatives  Positive urinalysis:  -Initial UA was suggestive of possible UTI -Patient is now s/p fosfomycin  -Urine culture with multiple species present -Afebrile  Fatty liver - noted on imaging    DVT prophylaxis: Lovenox Code Status: DNR Family Communication: Friend at bedside, discussed with her permission Disposition Plan: home in a.m. if pain is controlled on current oral regimen Consultants:   GI  Palliative care Procedures:   ERCP 11/1 with stent placement, repeat ERCP 11/6 with metal stent placement Antimicrobials:  Anti-infectives    Start     Dose/Rate Route Frequency Ordered Stop   09/13/16 1145  ciprofloxacin (CIPRO) IVPB 400 mg     400 mg 200 mL/hr over 60 Minutes Intravenous  Once 09/13/16 1137 09/13/16 1154       Objective: Vitals:   09/20/16 2210 09/21/16 0541 09/21/16 1000 09/21/16 1015  BP: (!) 147/91 130/85 (!) 150/101 132/82  Pulse: 98 100 (!) 108 100  Resp: 16 16 18    Temp: 98.9 F (37.2 C) 99 F (37.2 C)  98.9 F (37.2 C)   TempSrc: Oral Oral Oral   SpO2: 94% 93%    Weight:      Height:        Intake/Output Summary (Last 24 hours) at 09/21/16 1129 Last data filed at 09/21/16 0916  Gross per 24 hour  Intake             1840 ml  Output                0 ml  Net             1840 ml   Filed Weights    09/08/16 1843 09/09/16 0127 09/20/16 0409  Weight: 98.9 kg (218 lb) 96.7 kg (213 lb 3 oz) 99 kg (218 lb 4.1 oz)    Examination: General exam: Appears comfortable  HEENT: PERRLA, oral mucosa moist, Mildly sclera icterus no thrush Respiratory system: Clear to auscultation. Respiratory effort normal. Cardiovascular system: S1 & S2 heard, RRR.  No murmurs  Gastrointestinal system: Abdomen soft,  Much less tender in epigastrium, nondistended. Normal bowel sound. No organomegaly Central nervous system: Alert and oriented. No focal neurological deficits. Extremities: No cyanosis, clubbing or edema Skin: No rashes or ulcers Psychiatry:  Mood & affect appropriate.     Data Reviewed: I have personally reviewed following labs and imaging studies  CBC:  Recent Labs Lab 09/16/16 0500 09/19/16 0526  WBC 5.4 4.5  NEUTROABS 4.1 3.1  HGB 11.5* 10.8*  HCT 33.6* 32.4*  MCV 95.7 97.3  PLT 171 [id number]   Basic Metabolic Panel:  Recent Labs Lab 09/17/16 0555 09/18/16 0438 09/19/16 0526 09/20/16 0542 09/21/16 0515  NA 138 138 138 139 138  K 3.6 3.6 3.4* 3.5 3.5  CL 104 104 103 103 101  CO2 26 27 27 30 30   GLUCOSE 109* 105* 110* 114* 129*  BUN <5* <5* <5* <5* <5*  CREATININE 0.41* 0.48 0.43* 0.42* 0.47  CALCIUM 8.9 9.1 8.7* 8.8* 8.7*   GFR: Estimated Creatinine Clearance: 88.5 mL/min (by C-G formula based on SCr of 0.47 mg/dL). Liver Function Tests:  Recent Labs Lab 09/17/16 0555 09/18/16 0438 09/19/16 0526 09/20/16 0542 09/21/16 0515  AST 149* 179* 140* 124* 85*  ALT 194* 216* 192* 180* 153*  ALKPHOS 178* 191* 217* 222* 225*  BILITOT 7.9* 7.7* 8.0* 6.0* 4.3*  PROT 6.4* 6.8 6.5 5.8* 6.2*  ALBUMIN 3.1* 3.1* 3.0* 2.8* 3.0*    Recent Labs Lab 09/15/16 0625 09/16/16 0500  LIPASE 1,326* 362*   No results for input(s): AMMONIA in the last 168 hours. Coagulation Profile: No results for input(s): INR, PROTIME in the last 168 hours. Cardiac Enzymes: No results for input(s):  CKTOTAL, CKMB, CKMBINDEX, TROPONINI in the last 168 hours. BNP (last 3 results) No results for input(s): PROBNP in the last 8760 hours. HbA1C: No results for input(s): HGBA1C in the last 72 hours. CBG:  Recent Labs Lab 09/17/16 0735 09/18/16 0738 09/19/16 0746 09/20/16 0703 09/21/16 0726  GLUCAP 99 103* 97 117* 107*   Lipid Profile: No results for input(s): CHOL, HDL, LDLCALC, TRIG, CHOLHDL, LDLDIRECT in the last 72 hours. Thyroid Function Tests: No results for input(s): TSH, T4TOTAL, FREET4, T3FREE, THYROIDAB in the last 72 hours. Anemia Panel: No results for input(s): VITAMINB12, FOLATE, FERRITIN, TIBC, IRON, RETICCTPCT in the last 72 hours. Urine analysis:    Component Value Date/Time   COLORURINE ORANGE (A) 09/08/2016 2000   APPEARANCEUR CLEAR 09/08/2016 2000   LABSPEC 1.019 09/08/2016 2000   LABSPEC 1.015  01/18/2011 1144   PHURINE 6.5 09/08/2016 2000   GLUCOSEU NEGATIVE 09/08/2016 2000   HGBUR TRACE (A) 09/08/2016 2000   BILIRUBINUR LARGE (A) 09/08/2016 2000   BILIRUBINUR 1 06/04/2015 1122   BILIRUBINUR Negative 01/18/2011 [place] 09/08/2016 2000   PROTEINUR NEGATIVE 09/08/2016 2000   UROBILINOGEN 0.2 06/04/2015 2235   NITRITE POSITIVE (A) 09/08/2016 2000   LEUKOCYTESUR SMALL (A) 09/08/2016 2000   LEUKOCYTESUR Trace 01/18/2011 1144   Sepsis Labs: @LABRCNTIP (procalcitonin:4,lacticidven:4) ) No results found for this or any previous visit (from the past 240 hour(s)).       Radiology Studies: Dg Abd 1 View  Result Date: 09/20/2016 CLINICAL DATA:  Constipation for 2 days with abdominal pain EXAM: ABDOMEN - 1 VIEW COMPARISON:  09/15/2016 FINDINGS: Scattered large and small bowel gas is noted. Contrast material is noted within the colon from priors examination. Mild retained fecal material is noted which may represent some mild constipation. Multiple calcified uterine fibroids are seen. Vicarious excretion of contrast into the gallbladder is  noted. The biliary stent is better positioned and more expanded than on the prior exam. No bony abnormality is seen. IMPRESSION: Mild constipation.  No obstructive changes are noted. Electronically Signed   By: Inez Catalina M.D.   On: 09/20/2016 11:01      Scheduled Meds: . buPROPion  300 mg Oral Daily  . calcium carbonate  500 mg of elemental calcium Oral Daily  . cholecalciferol  1,000 Units Oral Daily  . citalopram  10 mg Oral Daily  . enoxaparin (LOVENOX) injection  40 mg Subcutaneous Q24H  . fentaNYL  75 mcg Transdermal Q72H  . gabapentin  300 mg Oral TID  . letrozole  2.5 mg Oral Daily  . LORazepam  1 mg Oral Daily  . LORazepam  2 mg Oral QHS  . nystatin  5 mL Oral QID  . ondansetron (ZOFRAN) IV  4 mg Intravenous Q6H  . pantoprazole  40 mg Oral Daily  . polyethylene glycol  34 g Oral BID  . senna  2 tablet Oral QHS  . zolpidem  5 mg Oral QHS   Continuous Infusions: . sodium chloride 1,000 mL (09/20/16 2351)     LOS: 12 days     Phillips Climes, MD Pager [phone] [facility] Pager: [email or web address] Password [email or web address] 09/21/2016, 11:29 AM

## 2016-09-22 DIAGNOSIS — G893 Neoplasm related pain (acute) (chronic): Secondary | ICD-10-CM

## 2016-09-22 DIAGNOSIS — K5909 Other constipation: Secondary | ICD-10-CM

## 2016-09-22 LAB — COMPREHENSIVE METABOLIC PANEL
ALT: 130 U/L — AB (ref 14–54)
AST: 58 U/L — ABNORMAL HIGH (ref 15–41)
Albumin: 3.1 g/dL — ABNORMAL LOW (ref 3.5–5.0)
Alkaline Phosphatase: 233 U/L — ABNORMAL HIGH (ref 38–126)
Anion gap: 7 (ref 5–15)
BUN: <5 mg/dL — ABNORMAL LOW (ref 6–20)
CALCIUM: 9 mg/dL (ref 8.9–10.3)
CHLORIDE: 104 mmol/L (ref 101–111)
CO2: 28 mmol/L (ref 22–32)
CREATININE: 0.46 mg/dL (ref 0.44–1.00)
Glucose, Bld: 105 mg/dL — ABNORMAL HIGH (ref 65–99)
Potassium: 3.7 mmol/L (ref 3.5–5.1)
Sodium: 139 mmol/L (ref 135–145)
Total Bilirubin: 3.1 mg/dL — ABNORMAL HIGH (ref 0.3–1.2)
Total Protein: 6.7 g/dL (ref 6.5–8.1)

## 2016-09-22 LAB — GLUCOSE, CAPILLARY: Glucose-Capillary: 103 mg/dL — ABNORMAL HIGH (ref 65–99)

## 2016-09-22 MED ORDER — POLYETHYLENE GLYCOL 3350 17 G PO PACK: 34.0000 g | PACK | Freq: Two times a day (BID) | ORAL | 0 refills | Status: AC

## 2016-09-22 MED ORDER — HEPARIN SOD (PORK) LOCK FLUSH 100 UNIT/ML IV SOLN
500.0000 [IU] | INTRAVENOUS | Status: AC | PRN
  Administered 2016-09-22: 500 [IU]
  Filled 2016-09-22: qty 5

## 2016-09-22 MED ORDER — FENTANYL 75 MCG/HR TD PT72: 75.0000 ug | MEDICATED_PATCH | TRANSDERMAL | 0 refills | Status: DC

## 2016-09-22 MED ORDER — SENNA 8.6 MG PO TABS: 2.0000 | ORAL_TABLET | Freq: Every day | ORAL | 0 refills | Status: AC

## 2016-09-22 MED ORDER — OXYCODONE HCL 30 MG PO TABS: ORAL_TABLET | ORAL | 0 refills | Status: AC

## 2016-09-22 NOTE — Progress Notes (Signed)
[facility] Progress Note  Tina Vaughan 55 y.o. 03-06-1961   Subjective: Doing ok. Tolerating diet and tolerating transition to PO pain meds.  Objective: Vital signs in last 24 hours: Vitals:   09/21/16 2112 09/22/16 0457  BP:  (!) 137/95  Pulse:  92  Resp: 18 18  Temp:  98.2 F (36.8 C)    Physical Exam: Gen: lethargic, no acute distress CV: RRR Chest: CTA B Abd: minimal epigastric tenderness without guarding, soft, nondistended, +BS Ext: no edema  Lab Results:  Recent Labs  09/21/16 0515 09/22/16 0540  NA 138 139  K 3.5 3.7  CL 101 104  CO2 30 28  GLUCOSE 129* 105*  BUN <5* <5*  CREATININE 0.47 0.46  CALCIUM 8.7* 9.0    Recent Labs  09/21/16 0515 09/22/16 0540  AST 85* 58*  ALT 153* 130*  ALKPHOS 225* 233*  BILITOT 4.3* 3.1*  PROT 6.2* 6.7  ALBUMIN 3.0* 3.1*   No results for input(s): WBC, NEUTROABS, HGB, HCT, MCV, PLT in the last 72 hours. No results for input(s): LABPROT, INR in the last 72 hours.    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 Tolerating PO diet. Stable to go home today and will need f/u LFTs next week. F/U with Dr. Watt Climes in 1-2 weeks.   [place] C. 09/22/2016, 10:07 AM  Pager [phone]  If no answer or after 5 PM call [phone] ID: Tina Vaughan, female   DOB: [birth year], 55 y.o.   MRN: [id number]

## 2016-09-22 NOTE — Progress Notes (Signed)
Daily Progress Note   Patient Name: Tina Vaughan       Date: 09/22/2016 DOB: 05-May-1961  Age: 55 y.o. MRN#: [id number] Attending Physician: Albertine Patricia, MD Primary Care Physician: Elise Benne Admit Date: 09/08/2016  Reason for Consultation/Follow-up: Pain management   Subjective: Patient resting in bed on arrival.  Reports pain remains well controlled on current regimen.  States "not quite" as effective as IV regimen with dilaudid, but she feels as though pain is adequately controlled and is looking forward to discharge.  She has had 4 doses of rescue medication ('30mg'$  oxycodone) in the last 24 hours.    She reports that her pain remains well controlled on current regimen.  Current pain is 3/10.  She reports that she will be able to get this medication on discharge, however, she does pay for her medication out of pocket and this has been financially difficult for her.  Discussed need to continue bowel regimen on discharge.  She reports having miralax and stool softener (?colace) of some type at home.  Recommended continue miralax and senna as senna has been shown to be more efficacious than colace for opioid induced constipation.  Length of Stay: 13  Current Medications: Scheduled Meds:  . buPROPion  300 mg Oral Daily  . calcium carbonate  500 mg of elemental calcium Oral Daily  . cholecalciferol  1,000 Units Oral Daily  . citalopram  10 mg Oral Daily  . enoxaparin (LOVENOX) injection  40 mg Subcutaneous Q24H  . fentaNYL  75 mcg Transdermal Q72H  . gabapentin  300 mg Oral TID  . letrozole  2.5 mg Oral Daily  . LORazepam  1 mg Oral Daily  . LORazepam  2 mg Oral QHS  . nystatin  5 mL Oral QID  . ondansetron (ZOFRAN) IV  4 mg Intravenous Q6H  . pantoprazole  40 mg Oral  Daily  . polyethylene glycol  34 g Oral BID  . senna  2 tablet Oral QHS  . zolpidem  5 mg Oral QHS    Continuous Infusions: . sodium chloride 100 mL/hr at 09/22/16 0248    PRN Meds: alum & mag hydroxide-simeth, fluocinonide cream, heparin lock flush, HYDROmorphone (DILAUDID) injection, ibuprofen, lidocaine-prilocaine, oxyCODONE, sodium chloride flush  Physical Exam  Constitutional: She is oriented to person, place, and time.  She appears well-developed.  HENT:  Head: Normocephalic and atraumatic.  Eyes: EOM are normal.  Neck: Normal range of motion.  Pulmonary/Chest: Effort normal. No respiratory distress.  Musculoskeletal: Normal range of motion.  Neurological: She is alert and oriented to person, place, and time.  Halting speech noted, slight short term memory deficit  Skin: Skin is warm and dry. There is pallor.  Psychiatric: She has a normal mood and affect. Her behavior is normal. Judgment and thought content normal. She exhibits abnormal recent memory.            Vital Signs: BP (!) 137/95 (BP Location: Right Arm)   Pulse 92   Temp 98.2 F (36.8 C) (Oral)   Resp 18   Ht '5\' 2"'$  (1.575 m)   Wt 99 kg (218 lb 4.1 oz)   LMP 05/08/2016 Comment: pt signed preg test waiver today 09/20/16  SpO2 94%   BMI 39.92 kg/m  SpO2: SpO2: 94 % O2 Device: O2 Device: Not Delivered O2 Flow Rate: O2 Flow Rate (L/min): 2 L/min  Intake/output summary:   Intake/Output Summary (Last 24 hours) at 09/22/16 1122 Last data filed at 09/22/16 3710  Gross per 24 hour  Intake          3348.33 ml  Output                0 ml  Net          3348.33 ml   LBM: Last BM Date: 09/21/16 Baseline Weight: Weight: 98.9 kg (218 lb) Most recent weight: Weight: 99 kg (218 lb 4.1 oz)       Palliative Assessment/Data: PPS 70%    Flowsheet Rows   Flowsheet Row Most Recent Value  Intake Tab  Referral Department  Hospitalist  Unit at Time of Referral  Oncology Unit  Palliative Care Primary Diagnosis   Cancer  Palliative Care Type  Return patient Palliative Care  Reason for referral  Pain  Date first seen by Palliative Care  09/14/16  Clinical Assessment  Palliative Performance Scale Score  40%  Pain Max last 24 hours  7  Pain Min Last 24 hours  4  Dyspnea Max Last 24 Hours  4  Dyspnea Min Last 24 hours  3  Nausea Max Last 24 Hours  3  Nausea Min Last 24 Hours  2  Anxiety Max Last 24 Hours  3  Psychosocial & Spiritual Assessment  Palliative Care Outcomes  Patient/Family meeting held?  Yes  Who was at the meeting?  patient who is decisional, sister was not in the room today   Palliative Care Outcomes  Improved pain interventions  Palliative Care follow-up planned  Yes, Facility      Patient Active Problem List   Diagnosis Date Noted  . Biliary obstruction   . Displacement of biliary stent   . Jaundice   . Abdominal pain, epigastric   . Constipation   . Common bile duct stricture   . 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 Acute pancreatitis without necrosis or infection, unspecified 09/15/2016  . Right upper quadrant abdominal pain   . Encounter for palliative care   . Depression 09/08/2016  . Abnormal liver function 09/08/2016  . Abdominal pain 09/08/2016  . Diarrhea 09/08/2016  . Speech impairment 03/09/2016  . Encounter for chemotherapy management 11/10/2015  . Bone metastasis ([place]) 11/02/2015  . Morbid obesity ([place]) 08/28/2015  . Shortness of breath 08/04/2015  . Dyspnea 08/04/2015  . Tachycardia 08/04/2015  . Hyperventilation  08/04/2015  . Respiratory rate increased 08/04/2015  . Chemotherapy induced nausea and vomiting 07/13/2015  . Nausea without vomiting 06/08/2015  . Special screening for malignant neoplasms, colon 06/08/2015  . Bloating 06/08/2015  . Early satiety 06/08/2015  . Pain in the abdomen 06/04/2015  . Generalized anxiety disorder 05/21/2015  . Fatigue 05/21/2015  . Sleep apnea 05/21/2015  . Headache 10/22/2014  . Lymphedema of upper  extremity 09/11/2014  . Status post prophylactic bilateral salpingo-oophorectomy for BRCA 03/19/2012  . Breast cancer of lower-outer quadrant of left female breast ([place]) 05/18/2011    Palliative Care Assessment & Plan   Patient Profile:   Assessment: Continues to feel well following stent placement and transition to oral pain regimen.  Plan for d/c today.  Recommendations/Plan:  Pain: Increased dose of fentanyl to 75 mcg on 11/7.  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  Therefore, plan to be more aggressive with short acting regimen over the next several days to see if her pain does continue to decrease now that stent has been placed.  She will need short-term follow-up to continue to titrate her medications.  Has been doing well on oxycodone '30mg'$  for breakthrough medication.  She has had 4 doses in the last 24 hours.  On discharge recommend continue oxycodone '30mg'$ , 0.5-1 tab every 4 hours as needed for breakthrough pain.  Discussed plan for pain management with her at length today.  Constipation, opioid related: Bowel regimen in place. Had BM following mag citrate.  Continue Zofran PRN and continue to monitor, denies nausea currently    Code Status:    Code Status Orders        Start     Ordered   09/08/16 2353  Do not attempt resuscitation (DNR)  Continuous    Question Answer Comment  In the event of cardiac or respiratory ARREST Do not call a "code blue"   In the event of cardiac or respiratory ARREST Do not perform Intubation, CPR, defibrillation or ACLS   In the event of cardiac or respiratory ARREST Use medication by any route, position, wound care, and other measures to relive pain and suffering. May use oxygen, suction and manual treatment of airway obstruction as needed for comfort.       09/08/16 2353    Code Status History    Date Active Date Inactive Code Status Order ID Comments User Context   09/08/2016  8:55 PM 09/08/2016 11:53 PM DNR [id number]  Lorayne Bender, PA-C ED    Advance Directive Documentation   Flowsheet Row Most Recent Value  Type of Advance Directive  Healthcare Power of Attorney  Pre-existing out of facility DNR order (yellow form or pink MOST form)  Yellow form placed in chart (order not valid for inpatient use)  "MOST" Form in Place?  No data       Prognosis:   Unable to determine  Discharge Planning:  To Be Determined  Care plan was discussed with  Patient  Thank you for allowing the Palliative Medicine Team to assist in the care of this patient.   Time In: 1100 Time Out: 1125 Total Time 25 Prolonged Time Billed  no       Greater than 50%  of this time was spent counseling and coordinating care related to the above assessment and plan.  Nandika Stetzer Domingo Cocking,  MD Please contact Palliative Medicine Team phone at [phone] for questions and concerns.

## 2016-09-22 NOTE — Discharge Summary (Signed)
Tina Vaughan, is a 55 y.o. female  DOB 29-Nov-1960  MRN [id number].  Admission date:  09/08/2016  Admitting Physician  Ivor Costa, MD  Discharge Date:  09/22/2016   Primary MD  Saguier, Percell Miller, PA-C  Recommendations for primary care physician for things to follow:  - Please check CBC, CMP during next visit(within 1 week) - Please readdress and adjust current discharge pain regimen, as evidenced with improvement of pain after insertion the biliary stent, see if her fentanyl patch may need to be decreased.   Admission Diagnosis  Abdominal pain [R10.9]   Discharge Diagnosis  Abdominal pain [R10.9]    Principal Problem:   Abdominal pain Active Problems:   Breast cancer of lower-outer quadrant of left female breast (HCC)   Generalized anxiety disorder   Bone metastasis (HCC)   Depression   Abnormal liver function   Diarrhea   Right upper quadrant abdominal pain   Encounter for palliative care   Common bile duct (CBD) stricture   Acute pancreatitis without necrosis or infection, unspecified   Abdominal pain, epigastric   Constipation   Common bile duct stricture   Displacement of biliary stent   Jaundice   Biliary obstruction      Past Medical History:  Diagnosis Date  . Anxiety   . Breast cancer, stage 3 ([place]) 2012/ 05/2015   left; inflammatory  . Depression   . Eczema   . Frequent urination   . GERD (gastroesophageal reflux disease)   . Headache   . Lymphedema   . Neuromuscular disorder ([place])   . 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 Obesity   . Sleep apnea   . Thyroid disease    Possible would like to be evaluated    Past Surgical History:  Procedure Laterality Date  . ERCP N/A 09/13/2016   Procedure: ENDOSCOPIC RETROGRADE CHOLANGIOPANCREATOGRAPHY (ERCP);  Surgeon: Clarene Essex, MD;  Location: [facility];  Service: Endoscopy;  Laterality: N/A;  . ERCP N/A  09/18/2016   Procedure: ENDOSCOPIC RETROGRADE CHOLANGIOPANCREATOGRAPHY (ERCP);  Surgeon: Clarene Essex, MD;  Location: [facility];  Service: Endoscopy;  Laterality: N/A;  . LAPAROSCOPY  03/14/2012   Procedure: LAPAROSCOPY OPERATIVE;  Surgeon: Osborne Oman, MD;  Location: [facility];  Service: Gynecology;  Laterality: N/A;  . MASTECTOMY Bilateral 05/12/11   total mastectomy on right, radical mastectomy on left  . OOPHORECTOMY    . PORT-A-CATH REMOVAL  04/19/2012   Procedure: MINOR REMOVAL PORT-A-CATH;  Surgeon: Haywood Lasso, MD;  Location: [place];  Service: General;  Laterality: N/A;  . Portacath     Placement   . PORTACATH PLACEMENT    . SALPINGOOPHORECTOMY  03/14/2012   Procedure: SALPINGO OOPHERECTOMY;  Surgeon: Osborne Oman, MD;  Location: [facility];  Service: Gynecology;  Laterality: Bilateral;       History of present illness and  Hospital Course:     Kindly see H&P for history of present illness and admission details, please review complete Labs,  Consult reports and Test reports for all details in brief  HPI  from the history and physical done on the day of admission 09/08/2016 HPI: Tina Vaughan is a 55 y.o. female with medical history significant of metastatic breast cancer (s/p of surgery and currently on Ibrance and letrozole), GERD, depression, anxiety, OSA on CPAP, edema, who presents with abdominal pain.  Pt states that she has been having abdominal pain in the past 5 days, which is new. Her abdominal pain is located in the right upper quadrant, constant, 5 out of 10 in severity, radiating to the right back. She also has nausea, but no vomiting. She has been having diarrhea in the past 4 days. She states that she was on stool softener, but she continues to have diarrhea after she stopped stool softer 3 days ago. She has 5-7 bowel movements with loose stool each day. No fever or chills. Patient denies symptoms of UTI. She does not have chest pain, shortness of  breath, cough, unilateral weakness.  ED Course: pt was found to have WBC 2.9, INR 0.97, lipase 26, positive urinalysis with small amount of leukocytes and positive nitrates, abnormal liver function with ALP 101, AST 110, ALT 311 and total bilirubin 5.1. Temperature 99.3, mildly tachycardia, O2 stat 93% on room air. Patient is placed on MedSurg bed for observation.     Hospital Course  55 y.o.femalewith medical history significant of metastatic breast cancer (s/p of surgery and currently on Ibrance and letrozole), GERD, depression, anxiety, OSA on CPAP, edema, who presents with abdominal pain.  No fever or chills. UA+ Patient denies symptoms of UTI.  She was found to have elevated LFTs in the ER. CT revealed periportal adenopathy. ERCP on 11/1 performed- CBD stent place for what looked like external compression of CBD. LFTs did not improve and therefore repeat ERCP performed 11/6 with removal of prior stent which was noted to be almost out of the CBD and insertion of a new stent. CBD stricture noted. Brushings from 11/1 unrevealing.  Abdominal pain and Abnormal liver function secondary to presumed malignant biliary stricture:  - Presenting ALP 101, AST 110, ALT 311 and total bilirubin 5.1. - CT abd with fatty liver and periportal adenopathy  - Abd [name] with fatty liver   - MRCP demonstrated suspicion for CBD obstruction -  ERCP shows biliary stricture suspected to be from external compression (from lymph nodes) which was stented. She developed post ERCP acute pancreatitis which has resolved.  LFT's did not improve therefore, ERCP repeated 11/6- stent was nearly out of CBD and was replaced with a metal stent- a CBD stricture was seen - Total bili trending down,8>6>4.3>3.1, tolerating soft diet, discussed with GI Dr. Michail Sermon, patient can be discharged with a follow-up with Dr. Watt Climes as  outpatient.   Acute pancreatitis - due to ERCP-   - Resolved  Breast cancer of lower-outer quadrant of  left female breast with bone mets and periportal lymphadenopathy- chronic pain -Patient is s/p mastectomy -Seen by Dr. Lindi Adie -Patient is continued on letrozole -Has been seen by palliative medicine regarding her pain, medication regimen has been adjusted, fentanyl patch increased from 25-75 g every 72 hours, and oxycodone has been increased , we discharged on oxycodone 15-30 mg every 4 hours from her home dose of ,15 milligrams every 4 hours, this regimen will need to be reassessed in the near future as I would anticipate improvement of abdominal pain status post. Stent insertion.  Depression and anxiety: -Mood seems stable at  present -Will continue Wellbutrin, Celexa and Ativan as tolerated  Recent Diarrhea/ grey stools -No further diarrhea noted, actually constipated recently required good laxative regimen  Positive urinalysis:  -Initial UA was suggestive of possible UTI -Patient is now s/p fosfomycin  -Urine culture with multiple species present -Afebrile  Fatty liver - noted on imaging    Discharge Condition:  Stable   Follow UP  Follow-up Information    Saguier, Percell Miller, PA-C Follow up in 1 week(s).   Specialties:  Internal Medicine, Family Medicine Why:  post Hospital stay  follow-up, and to check labs including CMP Contact information: [address] [place] [address] [phone]        MAGOD,MARC E, MD. Schedule an appointment as soon as possible for a visit.   Specialty:  Gastroenterology Contact information: [id number] [place]  [id number] [phone]             Discharge Instructions  and  Discharge Medications     Discharge Instructions    Discharge instructions    Complete by:  As directed    Follow with Primary MD Saguier, Percell Miller, PA-C in 7 days   Get CBC, CMP,  checked  by Primary MD next visit.    Activity: As tolerated with Full fall precautions use walker/cane & assistance as  needed  Disposition Home    Diet: Heart Healthy , soft , with feeding assistance and aspiration precautions.  For Heart failure patients - Check your Weight same time everyday, if you gain over 2 pounds, or you develop in leg swelling, experience more shortness of breath or chest pain, call your Primary MD immediately. Follow Cardiac Low Salt Diet and 1.5 lit/day fluid restriction.   On your next visit with your primary care physician please Get Medicines reviewed and adjusted.   Please request your Prim.MD to go over all Hospital Tests and Procedure/Radiological results at the follow up, please get all Hospital records sent to your Prim MD by signing hospital release before you go home.   If you experience worsening of your admission symptoms, develop shortness of breath, life threatening emergency, suicidal or homicidal thoughts you must seek medical attention immediately by calling 911 or calling your MD immediately  if symptoms less severe.  You Must read complete instructions/literature along with all the possible adverse reactions/side effects for all the Medicines you take and that have been prescribed to you. Take any new Medicines after you have completely understood and accpet all the possible adverse reactions/side effects.   Do not drive, operating heavy machinery, perform activities at heights, swimming or participation in water activities or provide baby sitting services if your were admitted for syncope or siezures until you have seen by Primary MD or a Neurologist and advised to do so again.  Do not drive when taking Pain medications.    Do not take more than prescribed Pain, Sleep and Anxiety Medications  Special Instructions: If you have smoked or chewed Tobacco  in the last 2 yrs please stop smoking, stop any regular Alcohol  and or any Recreational drug use.  Wear Seat belts while driving.   Please note  You were cared for by a hospitalist during your hospital  stay. If you have any questions about your discharge medications or the care you received while you were in the hospital after you are discharged, you can call the unit and asked to speak with the hospitalist on call if the hospitalist that  took care of you is not available. Once you are discharged, your primary care physician will handle any further medical issues. Please note that NO REFILLS for any discharge medications will be authorized once you are discharged, as it is imperative that you return to your primary care physician (or establish a relationship with a primary care physician if you do not have one) for your aftercare needs so that they can reassess your need for medications and monitor your lab values.   Increase activity slowly    Complete by:  As directed        Medication List    STOP taking these medications   fentaNYL 25 MCG/HR patch Commonly known as:  DURAGESIC - dosed mcg/hr Replaced by:  fentaNYL 75 MCG/HR   naproxen sodium 220 MG tablet Commonly known as:  ANAPROX   palbociclib 125 MG capsule Commonly known as:  IBRANCE     TAKE these medications   buPROPion 300 MG 24 hr tablet Commonly known as:  WELLBUTRIN XL Take 300 mg by mouth daily.   calcium carbonate 1500 (600 Ca) MG Tabs tablet Commonly known as:  OSCAL Take 600 mg of elemental calcium by mouth daily.   cholecalciferol 1000 units tablet Commonly known as:  VITAMIN D Take 1,000 Units by mouth daily.   citalopram 40 MG tablet Commonly known as:  CELEXA Take 10 mg by mouth daily.   fentaNYL 75 MCG/HR Commonly known as:  DURAGESIC - dosed mcg/hr Place 1 patch (75 mcg total) onto the skin every 3 (three) days. Replaces:  fentaNYL 25 MCG/HR patch   fluocinonide cream 0.05 % Commonly known as:  LIDEX Apply 1 application topically 2 (two) times daily as needed (for rash.).   gabapentin 300 MG capsule Commonly known as:  NEURONTIN Take 300 mg by mouth 3 (three) times daily.   ibuprofen 200 MG  tablet Commonly known as:  ADVIL,MOTRIN Take 600 mg by mouth every 8 (eight) hours as needed for mild pain or moderate pain.   letrozole 2.5 MG tablet Commonly known as:  FEMARA Take 2.5 mg by mouth daily.   lidocaine-prilocaine cream Commonly known as:  EMLA Apply 1 application topically as needed (prior to accessing port).   LORazepam 1 MG tablet Commonly known as:  ATIVAN Take 1-2 mg by mouth 2 (two) times daily. Pt takes one tablet in the morning and two at bedtime.   omeprazole 20 MG capsule Commonly known as:  PRILOSEC Take 20 mg by mouth daily.   ondansetron 8 MG tablet Commonly known as:  ZOFRAN Take 8 mg by mouth every 8 (eight) hours as needed for nausea or vomiting.   oxycodone 30 MG immediate release tablet Commonly known as:  ROXICODONE Please take half tablet to 1 tablet every 4 hours as needed for pain. What changed:  medication strength  how much to take  how to take this  when to take this  reasons to take this  additional instructions   polyethylene glycol packet Commonly known as:  MIRALAX / GLYCOLAX Take 34 g by mouth 2 (two) times daily.   senna 8.6 MG Tabs tablet Commonly known as:  SENOKOT Take 2 tablets (17.2 mg total) by mouth at bedtime.   zolpidem 10 MG tablet Commonly known as:  AMBIEN Take 10 mg by mouth at bedtime.         Diet and Activity recommendation: See Discharge Instructions above   Consults obtained -   GI  Palliative care   Major  procedures and Radiology Reports - PLEASE review detailed and final reports for all details, in brief -    ERCP 11/1 with stent placement, repeat ERCP 11/6 with metal stent placement  Dg Abd 1 View  Result Date: 09/20/2016 CLINICAL DATA:  Constipation for 2 days with abdominal pain EXAM: ABDOMEN - 1 VIEW COMPARISON:  09/15/2016 FINDINGS: Scattered large and small bowel gas is noted. Contrast material is noted within the colon from priors examination. Mild retained fecal  material is noted which may represent some mild constipation. Multiple calcified uterine fibroids are seen. Vicarious excretion of contrast into the gallbladder is noted. The biliary stent is better positioned and more expanded than on the prior exam. No bony abnormality is seen. IMPRESSION: Mild constipation.  No obstructive changes are noted. Electronically Signed   By: Inez Catalina M.D.   On: 09/20/2016 11:01   Ct Chest W Contrast  Result Date: 09/13/2016 CLINICAL DATA:  Metastatic breast cancer. Restaging. Diagnosed in 2012 with bilateral mastectomy. Metastasis in 2016. Oral chemotherapy in progress. EXAM: CT CHEST WITH CONTRAST TECHNIQUE: Multidetector CT imaging of the chest was performed during intravenous contrast administration. CONTRAST:  49mL ISOVUE-300 IOPAMIDOL (ISOVUE-300) INJECTION 61% COMPARISON:  Chest CT of 05/08/2016.  Abdominal MRI of 09/11/2016. FINDINGS: Cardiovascular: A right-sided Port-A-Cath which terminates at the mid right atrium. Aortic atherosclerosis. Mild cardiomegaly, without pericardial effusion. No central pulmonary embolism, on this non-dedicated study. Mediastinum/Nodes: Multiple sub cm hypo attenuating thyroid lesions. No supraclavicular adenopathy. Bilateral mastectomy. Left axillary node dissection. No axillary adenopathy. No mediastinal or hilar adenopathy. No internal mammary adenopathy. Lungs/Pleura: No pleural fluid. Similar appearance of left upper lobe irregular interstitial opacities and septal thickening, likely lymphangitic tumor spread. Interval development of left greater than right basilar and dependent posterior right upper lobe airspace and ground-glass opacities. Upper Abdomen: Moderate hepatic steatosis. gallbladder contrast likely relates to today's ERCP. Possible gallbladder sludge. Pneumobilia. New biliary stent. No biliary duct dilatation. Normal imaged portions of the spleen, stomach, adrenal glands, kidneys. Since the 09/11/16 MRI, development of  peripancreatic edema which is moderate, including on image 131/ series 2. Pancreas enhances normally. Musculoskeletal: Subcutaneous nodularity about the anterior right shoulder measures 11 mm on image 22/series 2 and is similar to the prior exam (when remeasured). Widespread sclerotic osseous metastasis. Index lesion in the left-sided T11 is similar at 1.3 cm. Sternal manubrial lesion is also not significantly changed. No complicating vertebral compression deformity. IMPRESSION: 1. Status post bilateral mastectomy. No thoracic adenopathy to suggest nodal metastasis. 2. Similar left upper lobe interstitial thickening which likely represents lymphangitic tumor spread. Bibasilar and posterior right upper lobe patchy airspace and ground-glass opacities are favored to represent infection or aspiration. Progressive lymphangitic tumor spread felt less likely. 3. Since 09/11/2016, placement of a biliary stent with resolution of biliary duct dilatation. 4. Interval development of pancreatitis since 09/11/2016. 5. Hepatic steatosis. 6. Similar subcutaneous nodularity about the anterior right shoulder. 7. Similar widespread sclerotic osseous metastasis. Electronically Signed   By: Abigail Miyamoto M.D.   On: 09/13/2016 16:42   US Abdomen Complete  Result Date: 09/09/2016 CLINICAL DATA:  Abdominal pain EXAM: ABDOMEN ULTRASOUND COMPLETE COMPARISON:  CT abdomen 09/09/2016, 05/08/2016 FINDINGS: Gallbladder: No gallstones or wall thickening visualized. No sonographic Murphy sign noted by sonographer. Common bile duct: Diameter: 4 mm Liver: Small area of relative hypoechogenicity adjacent to the gallbladder fossa likely reflecting an area of focal fatty sparing. Increased hepatic parenchymal echogenicity. IVC: Limited visualization secondary to overlying bowel gas. Pancreas: Limited visualization secondary to  overlying bowel gas. Spleen: Size and appearance within normal limits. Right Kidney: Length: 11.3 cm. Echogenicity within  normal limits. No mass or hydronephrosis visualized. Left Kidney: Length: 11.1 cm. Echogenicity within normal limits. No mass or hydronephrosis visualized. Abdominal aorta: 2.8 cm in greatest AP diameter. Other findings: None. IMPRESSION: 1. No cholelithiasis or sonographic evidence of acute cholecystitis. 2. Hepatic steatosis. 3. Ectatic abdominal aorta at risk for aneurysm development. Recommend followup by ultrasound in 5 years. This recommendation follows ACR consensus guidelines: White Paper of the ACR Incidental Findings Committee II on Vascular Findings. J Am Coll Radiol 2013; 10:789-794. Electronically Signed   By: Kathreen Devoid   On: 09/09/2016 09:41   Ct Abdomen Pelvis W Contrast  Result Date: 09/09/2016 CLINICAL DATA:  History of breast cancer progressed to the lymph nodes. Right-sided abdominal pain with nausea EXAM: CT ABDOMEN AND PELVIS WITH CONTRAST TECHNIQUE: Multidetector CT imaging of the abdomen and pelvis was performed using the standard protocol following bolus administration of intravenous contrast. CONTRAST:  191mL ISOVUE-300 IOPAMIDOL (ISOVUE-300) INJECTION 61% COMPARISON:  05/08/2016, 11/01/2015 FINDINGS: Lower chest: Patchy dependent atelectasis. Hazy atelectasis versus infiltrate in the left lower lobe. No effusion. No discrete nodules. Hepatobiliary: Diffuse decreased density of the hepatic parenchyma consistent with fatty infiltration. A focal hypodensity near the falciform ligament is unchanged and could represent more focal geographic fatty change. No discrete enhancing hepatic mass visualized. No calcified stones in the gallbladder. No biliary dilatation. Pancreas: Unremarkable. No pancreatic ductal dilatation or surrounding inflammatory changes. Spleen: Normal in size without focal abnormality. Adrenals/Urinary Tract: Adrenal glands are unremarkable. Kidneys are normal, without renal calculi, focal lesion, or hydronephrosis. Bladder is unremarkable. Stomach/Bowel: Stomach is  nondilated. There is no dilated small bowel. Appendix is visualized and is normal. Vascular/Lymphatic: Non aneurysmal aorta. Slight interval increase in size of periportal lymph nodes. The larger of the 2 nodes measures 1.2 cm, compared with 8 mm previously. A node anterior to the pancreatic head neck measures 1 cm compared with 7 cm previous. No significantly enlarged retroperitoneal or pelvic nodes. Reproductive: Multiple calcified fibroids within the uterus. No gross adnexal masses. Other: No free air or free fluid. Musculoskeletal: Extensive foci of sclerosis involving the femurs, pelvic bones, and spine, compatible with skeletal metastatic disease. IMPRESSION: 1. Fatty infiltration of the liver. No definitive focal hepatic mass visualized. 2. Slight interval increase in periportal adenopathy, metastatic disease cannot be excluded. 3. Calcified uterine fibroids 4. 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 Electronically Signed   By: Donavan Foil M.D.   On: 09/09/2016 01:51   Mr 3d Recon At Scanner  Result Date: 09/11/2016 CLINICAL DATA:  Elevated bilirubin and liver enzymes. Known fatty liver. LFTs have risen overnight with increasing abdominal pain. EXAM: MRI ABDOMEN WITHOUT AND WITH CONTRAST (INCLUDING MRCP) TECHNIQUE: Multiplanar multisequence MR imaging of the abdomen was performed both before and after the administration of intravenous contrast. Heavily T2-weighted images of the biliary and pancreatic ducts were obtained, and three-dimensional MRCP images were rendered by post processing. Please note that the patient vomited after IV contrast and refused to complete the exam. Accordingly we are missing are variety of series. CONTRAST:  3mL MULTIHANCE GADOBENATE DIMEGLUMINE 529 MG/ML IV SOLN COMPARISON:  None. FINDINGS: Lower chest: Indistinct accentuated T2 signal primarily along the interstitium of the lower lobes with suspected airway thickening. This would be better  demonstrated by CT. Hepatobiliary: Diffuse hepatic steatosis. Abnormal intrahepatic biliary dilatation is observed along with truncation of a 4.2 cm segment of  the distal common hepatic duct and common bile duct as shown on image 98/400. There is a small segment of the distal CBD which is visible but the 4 cm intervening segment demonstrates tapered margins and nonvisualization of the duct in this vicinity. Looking back at the CT from 09/09/2016, there is potentially high-density or enhancement in this segment of the CBD. A well-defined filling defects/stones are not seen. The intrahepatic biliary system is dilated. There is not motion artifact but the CBD is difficult to make out on the single available post-contrast series which appears to be in the arterial phase. If anything there is high density and enhancement in the segment that is presumably occluded. Based on the subtraction images I believe that there is enhancement in this segment of the extrahepatic biliary tree. Periportal edema noted. Pancreas:  Unremarkable Spleen:  Unremarkable Adrenals/Urinary Tract:  Unremarkable Stomach/Bowel: Unremarkable Vascular/Lymphatic: Accentuated lymph nodes in the porta hepatis including a 1.4 cm portacaval lymph node. Other:  No supplemental non-categorized findings. Musculoskeletal: Degenerative endplate findings in the lumbar spine. IMPRESSION: 1. There is a 4.2 cm segment of the common bile duct and distal common hepatic duct which appears occluded with associated enhancement and intrahepatic biliary dilatation. My impression is that there is enhancement in this vicinity of occlusion and accordingly it is probably not simply due to a blood clot or stones. Ascending cholangitis causing wall thickening and resulting occlusion is raised as a possibility. The stranding and adenopathy along the porta hepatis may all be reactive. 2. Please note the patient experienced vomiting right after contrast and accordingly we only  have 1 arterial phase post-contrast series rather than the multiphasic analysis normally performed. The patient refused any further imaging after the vomiting. 3. Diffuse hepatic steatosis. 4. Interstitial accentuation in the lung bases. 5. Periportal edema, nonspecific. Electronically Signed   By: Van Clines M.D.   On: 09/11/2016 19:00   Dg Ercp  Result Date: 09/18/2016 CLINICAL DATA:  Jaundice.  Stent exchange. EXAM: ERCP TECHNIQUE: Multiple spot images obtained with the fluoroscopic device and submitted for interpretation post-procedure. FLUOROSCOPY TIME:  Fluoroscopy Time:  2 minutes and 17 seconds Radiation Exposure Index (if provided by the fluoroscopic device): 78.4 mGy COMPARISON:  09/13/2016 FINDINGS: The metallic stent was removed. Wire advanced into the central intrahepatic ducts. There is contrast in the intrahepatic ducts. A new metallic biliary stent was placed. Again noted is narrowing in the distal common bile duct region. IMPRESSION: Biliary stent exchange. These images were submitted for radiologic interpretation only. Please see the procedural report for the amount of contrast and the fluoroscopy time utilized. Electronically Signed   By: Markus Daft M.D.   On: 09/18/2016 12:50   Dg Ercp Biliary & Pancreatic Ducts  Result Date: 09/13/2016 CLINICAL DATA:  Stricture EXAM: ERCP TECHNIQUE: Multiple spot images obtained with the fluoroscopic device and submitted for interpretation post-procedure. FLUOROSCOPY TIME:  Fluoroscopy Time:  3 minutes and 25 seconds Radiation Exposure Index (if provided by the fluoroscopic device): 64.45 mil Number of Acquired Spot Images: 6 COMPARISON:  None. FINDINGS: Imaging demonstrates a long segment stricture in the common bile duct. Final image demonstrates placement of a metallic stent across the stricture. The stent is patent. IMPRESSION: See above. These images were submitted for radiologic interpretation only. Please see the procedural report for the  amount of contrast and the fluoroscopy time utilized. Electronically Signed   By: Marybelle Killings M.D.   On: 09/13/2016 14:15   Dg Abd 2 Views  Result Date:  09/15/2016 CLINICAL DATA:  Displacement of biliary stent. EXAM: ABDOMEN - 2 VIEW COMPARISON:  ERCP [id number] FINDINGS: Metallic biliary stent is seen in the right upper quadrant which appears to be in stable position when compared to prior he ERCP. There is pneumobilia noted within the liver. Nonobstructive bowel gas pattern with moderate gas in stool within the colon. Calcified fibroids in the pelvis. No free air. IMPRESSION: Metallic biliary stent appears to be in stable position when compared to prior ERCP. Pneumobilia. Moderate stool in the colon. Electronically Signed   By: Rolm Baptise M.D.   On: 09/15/2016 10:21   Dg Abd Portable 1v  Result Date: 09/11/2016 CLINICAL DATA:  History of bowel obstruction, right upper quadrant pain with nausea today. EXAM: PORTABLE ABDOMEN - 1 VIEW COMPARISON:  Abdominal and pelvic CT scan of September 09, 2016 FINDINGS: The colonic stool burden is moderate especially in the right colon. There is no small or large bowel obstructive pattern. No free extraluminal gas collections are observed calcifications in the pelvis likely reflect fibroids. Sclerotic foci are noted in both hips and throughout the bony pelvis and in the lumbar spine. The lung bases are grossly clear. IMPRESSION: Mildly increase colonic stool burden predominantly on the right without evidence of large or small bowel obstruction or perforation. Diffuse metastatic disease involving the lumbar spine, pelvis, and hips. Electronically Signed   By: David  Martinique M.D.   On: 09/11/2016 10:21   Dg Swallowing Func-speech Pathology  Result Date: 09/15/2016 Objective Swallowing Evaluation: Type of Study: MBS-Modified Barium Swallow Study Patient Details Name: KASMIRA LUKOWSKI MRN: [id number] Date of Birth: 1961/11/05 Today's Date: 09/15/2016 Time: SLP Start Time (ACUTE  ONLY): 1045-SLP Stop Time (ACUTE ONLY): 1100 SLP Time Calculation (min) (ACUTE ONLY): 15 min Past Medical History: Past Medical History: Diagnosis Date . Anxiety  . Breast cancer, stage 3 ([place]) 2012/ 05/2015  left; inflammatory . Depression  . Eczema  . Frequent urination  . GERD (gastroesophageal reflux disease)  . Headache  . Lymphedema  . Neuromuscular disorder ([place])  . 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 Obesity  . Sleep apnea  . Thyroid disease   Possible would like to be evaluated Past Surgical History: Past Surgical History: Procedure Laterality Date . ERCP N/A 09/13/2016  Procedure: ENDOSCOPIC RETROGRADE CHOLANGIOPANCREATOGRAPHY (ERCP);  Surgeon: Clarene Essex, MD;  Location: [facility];  Service: Endoscopy;  Laterality: N/A; . LAPAROSCOPY  03/14/2012  Procedure: LAPAROSCOPY OPERATIVE;  Surgeon: Osborne Oman, MD;  Location: [facility];  Service: Gynecology;  Laterality: N/A; . MASTECTOMY Bilateral 05/12/11  total mastectomy on right, radical mastectomy on left . OOPHORECTOMY   . PORT-A-CATH REMOVAL  04/19/2012  Procedure: MINOR REMOVAL PORT-A-CATH;  Surgeon: Haywood Lasso, MD;  Location: [facility];  Service: General;  Laterality: N/A; . Portacath    Placement  . PORTACATH PLACEMENT   . SALPINGOOPHORECTOMY  03/14/2012  Procedure: SALPINGO OOPHERECTOMY;  Surgeon: Osborne Oman, MD;  Location: [facility];  Service: Gynecology;  Laterality: Bilateral; HPI: Pt is a 55 y.o. female with PMH of metastatic breast cancer, GERD, depression, anxiety, OSA on CPAP, edema, who presented with abdominal pain on 10/28. Chest CT on 11/1 indicated RLL and posterior RUL airspace and ground-glass opacities. MD with concern for aspiration and ordered MBS to rule out. No Data Recorded Assessment / Plan / Recommendation CHL IP CLINICAL IMPRESSIONS 09/15/2016 Therapy Diagnosis WFL Clinical Impression Pt with normal swallow function. Swallow is timely. No penetration or aspiration  noted, no significant  residuals post-swallow. Will defer diet recommendation to MD as pt is currently on full liquid; pt had no oropharyngeal difficulty with regular solid. Will sign off at this time; please re-consult if needs arise.  Impact on safety and function Mild aspiration risk   CHL IP TREATMENT RECOMMENDATION 09/15/2016 Treatment Recommendations No treatment recommended at this time   No flowsheet data found. CHL IP DIET RECOMMENDATION 09/15/2016 SLP Diet Recommendations Other (Comment) Liquid Administration via Cup;Straw Medication Administration Whole meds with liquid Compensations -- Postural Changes Seated upright at 90 degrees   CHL IP OTHER RECOMMENDATIONS 09/15/2016 Recommended Consults -- Oral Care Recommendations Oral care BID Other Recommendations --   CHL IP FOLLOW UP RECOMMENDATIONS 09/15/2016 Follow up Recommendations None   No flowsheet data found.     CHL IP ORAL PHASE 09/15/2016 Oral Phase WFL Oral - Pudding Teaspoon -- Oral - Pudding Cup -- Oral - Honey Teaspoon -- Oral - Honey Cup -- Oral - Nectar Teaspoon -- Oral - Nectar Cup -- Oral - Nectar Straw -- Oral - Thin Teaspoon -- Oral - Thin Cup -- Oral - Thin Straw -- Oral - Puree -- Oral - Mech Soft -- Oral - Regular -- Oral - Multi-Consistency -- Oral - Pill -- Oral Phase - Comment --  CHL IP PHARYNGEAL PHASE 09/15/2016 Pharyngeal Phase WFL Pharyngeal- Pudding Teaspoon -- Pharyngeal -- Pharyngeal- Pudding Cup -- Pharyngeal -- Pharyngeal- Honey Teaspoon -- Pharyngeal -- Pharyngeal- Honey Cup -- Pharyngeal -- Pharyngeal- Nectar Teaspoon -- Pharyngeal -- Pharyngeal- Nectar Cup -- Pharyngeal -- Pharyngeal- Nectar Straw -- Pharyngeal -- Pharyngeal- Thin Teaspoon -- Pharyngeal -- Pharyngeal- Thin Cup -- Pharyngeal -- Pharyngeal- Thin Straw -- Pharyngeal -- Pharyngeal- Puree -- Pharyngeal -- Pharyngeal- Mechanical Soft -- Pharyngeal -- Pharyngeal- Regular -- Pharyngeal -- Pharyngeal- Multi-consistency -- Pharyngeal -- Pharyngeal- Pill -- Pharyngeal -- Pharyngeal Comment --   CHL IP CERVICAL ESOPHAGEAL PHASE 09/15/2016 Cervical Esophageal Phase WFL Pudding Teaspoon -- Pudding Cup -- Honey Teaspoon -- Honey Cup -- Nectar Teaspoon -- Nectar Cup -- Nectar Straw -- Thin Teaspoon -- Thin Cup -- Thin Straw -- Puree -- Mechanical Soft -- Regular -- Multi-consistency -- Pill -- Cervical Esophageal Comment -- CHL IP GO 03/27/2016 Functional Assessment Tool Used (None) Functional Limitations Motor speech Swallow Current Status [phone]) (None) Swallow Goal Status [id number]) (None) Swallow Discharge Status [id number]) (None) Motor Speech Current Status [id number]) CJ Motor Speech Goal Status UK:060616) CJ Motor Speech Goal Status SA:931536) CJ Spoken Language Comprehension Current Status MZ:5018135) (None) Spoken Language Comprehension Goal Status YD:1972797) (None) Spoken Language Comprehension Discharge Status UF:4533880) (None) Spoken Language Expression Current Status FP:837989) (None) Spoken Language Expression Goal Status LT:9098795) (None) Spoken Language Expression Discharge Status NF:1565649) (None) Attention Current Status OM:1732502) (None) Attention Goal Status EY:7266000) (None) Attention Discharge Status PJ:4613913) (None) Memory Current Status YL:3545582) (None) Memory Goal Status CF:3682075) (None) Memory Discharge Status QC:115444) (None) Voice Current Status BV:6183357) (None) Voice Goal Status EW:8517110) (None) Voice Discharge Status JH:9561856) (None) Other Speech-Language Pathology Functional Limitation UC:978821) (None) Other Speech-Language Pathology Functional Limitation Goal Status XD:1448828) (None) Other Speech-Language Pathology Functional Limitation Discharge Status [phone]) (None) Kern Reap, MA, CCC-SLP 09/15/2016, 11:24 AM [phone]             Mr Abdomen Mrcp W Wo Contast  Result Date: 09/11/2016 CLINICAL DATA:  Elevated bilirubin and liver enzymes. Known fatty liver. LFTs have risen overnight with increasing abdominal pain. EXAM: MRI ABDOMEN WITHOUT AND WITH CONTRAST (INCLUDING MRCP) TECHNIQUE: Multiplanar multisequence MR imaging of  the  abdomen was performed both before and after the administration of intravenous contrast. Heavily T2-weighted images of the biliary and pancreatic ducts were obtained, and three-dimensional MRCP images were rendered by post processing. Please note that the patient vomited after IV contrast and refused to complete the exam. Accordingly we are missing are variety of series. CONTRAST:  80mL MULTIHANCE GADOBENATE DIMEGLUMINE 529 MG/ML IV SOLN COMPARISON:  None. FINDINGS: Lower chest: Indistinct accentuated T2 signal primarily along the interstitium of the lower lobes with suspected airway thickening. This would be better demonstrated by CT. Hepatobiliary: Diffuse hepatic steatosis. Abnormal intrahepatic biliary dilatation is observed along with truncation of a 4.2 cm segment of the distal common hepatic duct and common bile duct as shown on image 98/400. There is a small segment of the distal CBD which is visible but the 4 cm intervening segment demonstrates tapered margins and nonvisualization of the duct in this vicinity. Looking back at the CT from 09/09/2016, there is potentially high-density or enhancement in this segment of the CBD. A well-defined filling defects/stones are not seen. The intrahepatic biliary system is dilated. There is not motion artifact but the CBD is difficult to make out on the single available post-contrast series which appears to be in the arterial phase. If anything there is high density and enhancement in the segment that is presumably occluded. Based on the subtraction images I believe that there is enhancement in this segment of the extrahepatic biliary tree. Periportal edema noted. Pancreas:  Unremarkable Spleen:  Unremarkable Adrenals/Urinary Tract:  Unremarkable Stomach/Bowel: Unremarkable Vascular/Lymphatic: Accentuated lymph nodes in the porta hepatis including a 1.4 cm portacaval lymph node. Other:  No supplemental non-categorized findings. Musculoskeletal: Degenerative endplate  findings in the lumbar spine. IMPRESSION: 1. There is a 4.2 cm segment of the common bile duct and distal common hepatic duct which appears occluded with associated enhancement and intrahepatic biliary dilatation. My impression is that there is enhancement in this vicinity of occlusion and accordingly it is probably not simply due to a blood clot or stones. Ascending cholangitis causing wall thickening and resulting occlusion is raised as a possibility. The stranding and adenopathy along the porta hepatis may all be reactive. 2. Please note the patient experienced vomiting right after contrast and accordingly we only have 1 arterial phase post-contrast series rather than the multiphasic analysis normally performed. The patient refused any further imaging after the vomiting. 3. Diffuse hepatic steatosis. 4. Interstitial accentuation in the lung bases. 5. Periportal edema, nonspecific. Electronically Signed   By: Van Clines M.D.   On: 09/11/2016 19:00    Micro Results    No results found for this or any previous visit (from the past 240 hour(s)).     Today   Subjective:   Tina Vaughan today has no headache,no chest Or abdominal pain,no new weakness tingling or numbness, feels much better wants to go home today.   Objective:   Blood pressure (!) 137/95, pulse 92, temperature 98.2 F (36.8 C), temperature source Oral, resp. rate 18, height 5\' 2"  (1.575 m), weight 99 kg (218 lb 4.1 oz), last menstrual period 05/08/2016, SpO2 94 %.   Intake/Output Summary (Last 24 hours) at 09/22/16 1117 Last data filed at 09/22/16 0959  Gross per 24 hour  Intake          3348.33 ml  Output                0 ml  Net  3348.33 ml    Exam Awake Alert, Oriented x 3, No new F.N deficits, Normal affect [place].AT,PERRAL Supple Neck,No JVD, .  Symmetrical Chest wall movement, Good air movement bilaterally, CTAB RRR,No Gallops,Rubs or new Murmurs, No Parasternal Heave +ve B.Sounds, Abd Soft, Non tender,  No organomegaly appriciated, No rebound -guarding or rigidity. No Cyanosis, Clubbing or edema, No new Rash or bruise  Data Review   CBC w Diff:  Lab Results  Component Value Date   WBC 4.5 09/19/2016   HGB 10.8 (L) 09/19/2016   HGB 12.3 09/07/2016   HCT 32.4 (L) 09/19/2016   HCT 34.9 09/07/2016   PLT 301 09/19/2016   PLT 139 (L) 09/07/2016   LYMPHOPCT 15 09/19/2016   LYMPHOPCT 25.2 09/07/2016   MONOPCT 13 09/19/2016   MONOPCT 6.9 09/07/2016   EOSPCT 3 09/19/2016   EOSPCT 0.9 09/07/2016   BASOPCT 1 09/19/2016   BASOPCT 1.4 09/07/2016    CMP:  Lab Results  Component Value Date   NA 139 09/22/2016   NA 143 09/07/2016   K 3.7 09/22/2016   K 3.5 09/07/2016   CL 104 09/22/2016   CL 108 (H) 07/30/2012   CO2 28 09/22/2016   CO2 24 09/07/2016   BUN <5 (L) 09/22/2016   BUN 6.6 (L) 09/07/2016   CREATININE 0.46 09/22/2016   CREATININE 0.7 09/07/2016   PROT 6.7 09/22/2016   PROT 7.0 09/07/2016   ALBUMIN 3.1 (L) 09/22/2016   ALBUMIN 3.5 09/07/2016   BILITOT 3.1 (H) 09/22/2016   BILITOT 3.99 (HH) 09/07/2016   ALKPHOS 233 (H) 09/22/2016   ALKPHOS 128 09/07/2016   AST 58 (H) 09/22/2016   AST 205 (HH) 09/07/2016   ALT 130 (H) 09/22/2016   ALT 507 (HH) 09/07/2016  .   Total Time in preparing paper work, data evaluation and todays exam - 35 minutes  ELGERGAWY, DAWOOD M.D on 09/22/2016 at 11:17 AM  [facility]   Office  [phone]

## 2016-09-22 NOTE — Progress Notes (Signed)
Patient d/c home. Stable. 

## 2016-09-22 NOTE — Care Management Important Message (Signed)
Important Message  Patient Details  Name: Tina Vaughan MRN: [id number] Date of Birth: [birth year]   Medicare Important Message Given:  Yes    Camillo Flaming 09/22/2016, 12:05 [place] Message  Patient Details  Name: Tina Vaughan MRN: [id number] Date of Birth: [birth year]   Medicare Important Message Given:  Yes    Camillo Flaming 09/22/2016, 12:04 [place] Message  Patient Details  Name: Tina Vaughan MRN: [id number] Date of Birth: August 20, 1961   Medicare Important Message Given:  Yes    Camillo Flaming 09/22/2016, 12:04 PM

## 2016-09-22 NOTE — Discharge Instructions (Signed)
Follow with Primary MD Saguier, Percell Miller, PA-C in 7 days   Get CBC, CMP,  checked  by Primary MD next visit.    Activity: As tolerated with Full fall precautions use walker/cane & assistance as needed  Disposition Home    Diet: Heart Healthy , soft , with feeding assistance and aspiration precautions.  For Heart failure patients - Check your Weight same time everyday, if you gain over 2 pounds, or you develop in leg swelling, experience more shortness of breath or chest pain, call your Primary MD immediately. Follow Cardiac Low Salt Diet and 1.5 lit/day fluid restriction.   On your next visit with your primary care physician please Get Medicines reviewed and adjusted.   Please request your Prim.MD to go over all Hospital Tests and Procedure/Radiological results at the follow up, please get all Hospital records sent to your Prim MD by signing hospital release before you go home.   If you experience worsening of your admission symptoms, develop shortness of breath, life threatening emergency, suicidal or homicidal thoughts you must seek medical attention immediately by calling 911 or calling your MD immediately  if symptoms less severe.  You Must read complete instructions/literature along with all the possible adverse reactions/side effects for all the Medicines you take and that have been prescribed to you. Take any new Medicines after you have completely understood and accpet all the possible adverse reactions/side effects.   Do not drive, operating heavy machinery, perform activities at heights, swimming or participation in water activities or provide baby sitting services if your were admitted for syncope or siezures until you have seen by Primary MD or a Neurologist and advised to do so again.  Do not drive when taking Pain medications.    Do not take more than prescribed Pain, Sleep and Anxiety Medications  Special Instructions: If you have smoked or chewed Tobacco  in the last 2  yrs please stop smoking, stop any regular Alcohol  and or any Recreational drug use.  Wear Seat belts while driving.   Please note  You were cared for by a hospitalist during your hospital stay. If you have any questions about your discharge medications or the care you received while you were in the hospital after you are discharged, you can call the unit and asked to speak with the hospitalist on call if the hospitalist that took care of you is not available. Once you are discharged, your primary care physician will handle any further medical issues. Please note that NO REFILLS for any discharge medications will be authorized once you are discharged, as it is imperative that you return to your primary care physician (or establish a relationship with a primary care physician if you do not have one) for your aftercare needs so that they can reassess your need for medications and monitor your lab values.

## 2016-09-22 NOTE — Progress Notes (Signed)
Patient was given multiple prescriptions. Discharge instructions rendered, verbalized understanding. All questions answered appropriately. Port a cath flushed and deaaceesd per protocol,site unremarkable. Patient denies pain, no c/o n/v.

## 2016-09-25 ENCOUNTER — Telehealth: Payer: Self-pay | Admitting: *Deleted

## 2016-09-25 NOTE — Telephone Encounter (Signed)
Pt taking a nap at time of TCM call and stated she would call back later.

## 2016-09-26 ENCOUNTER — Telehealth: Payer: Self-pay

## 2016-09-26 NOTE — Telephone Encounter (Signed)
PT called regarding wanting to set up follow up appt with Dr. Lindi Adie after getting out of the hospital. Called pt back to let her know that Dr. Lindi Adie is out of the office until Friday and will ask MD if he can see pt before her on her next lab and infusion day 11/24.  Pt verbalized understanding and states that she had already made appt with gi and pcp follow up appt in the next week or two. Told pt to call with any other additional concerns or questions. Will send scheduling request after discussing with Dr. Lindi Adie.

## 2016-09-26 NOTE — Telephone Encounter (Signed)
Transition Care Management Follow-up Telephone Call  Per Discharge Summary:  Admission date:  09/08/2016  Admitting Physician  Ivor Costa, MD  Discharge Date:  09/22/2016   Primary MD  Saguier, Percell Miller, PA-C  Recommendations for primary care physician for things to follow:  - Please check CBC, CMP during next visit(within 1 week) - Please readdress and adjust current discharge pain regimen, as evidenced with improvement of pain after insertion the biliary stent, see if her fentanyl patch may need to be decreased.   Admission Diagnosis  Abdominal pain [R10.9]   Discharge Diagnosis  Abdominal pain [R10.9]    Principal Problem:   Abdominal pain Active Problems:   Breast cancer of lower-outer quadrant of left female breast (HCC)   Generalized anxiety disorder   Bone metastasis (HCC)   Depression   Abnormal liver function   Diarrhea   Right upper quadrant abdominal pain   Encounter for palliative care   Common bile duct (CBD) stricture   Acute pancreatitis without necrosis or infection, unspecified   Abdominal pain, epigastric   Constipation   Common bile duct stricture   Displacement of biliary stent   Jaundice   Biliary obstruction  --   How have you been since you were released from the hospital? "I think I've been pretty good. I'm not in nearly as much pain as I was. I think that's a good thing."   Do you understand why you were in the hospital? yes   Do you understand the discharge instructions? yes   Where were you discharged to? Home   Items Reviewed:  Medications reviewed: yes  Allergies reviewed: yes  Dietary changes reviewed: yes  Referrals reviewed: no, none made    Functional Questionnaire:   Activities of Daily Living (ADLs):   She states they are independent in the following: ambulation, bathing and hygiene, feeding, continence, grooming, toileting and dressing States they require assistance with the following: bathing and  hygiene-tries not to take a shower unless someone else is home, but is able to do it by herself   Any transportation issues/concerns?: yes, 'I don't drive anymore, but I just signed up to get that SC[employer] program.'   Any patient concerns? no   Confirmed importance and date/time of follow-up visits scheduled yes  Provider Appointment booked with Mackie Pai, PA-C 10/04/16 @ 2:00pm  Confirmed with patient if condition begins to worsen call PCP or go to the ER.  Patient was given the office number and encouraged to call back with question or concerns.  : yes

## 2016-10-02 NOTE — Assessment & Plan Note (Signed)
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 BRCA 2 mutation underwent salpingo-oophorectomy Status post bilateral mastectomies and radiation to left chest wall and axilla currently on tamoxifen since October 2012) Prior treatment: Halaven day 1 and day 8 every 3 weeks 6 cycles started 06/29/2015 and completed 10/19/2015 PET/CT scan 11/01/2015:Focal patchy LUL opacities new/increased possibly reflecting infection or tumor, mild improving hypermet LN in Rt paratracheal and left infrahilar region, subcut mets to right shoulder and right post gluteal, ext bone mets CT angiogram 12/19/2015: No PE slight interval worsening of consolidative opacities within the left upper lobe ------------------------------------------------------------------------------------------------------------------------------------------------------- Current treatment: letrozole with Ibrance along with Zometa started 11/12/2015  Ibrance toxicities: 1. Peripheral Neuropathy 2. Neutropenia: grade 1: Did not require dose reduction. 3. Hot flashes related to letrozole  4. Nausea: Takes Zofran when necessary  Shortness of breath: CT scans do not reveal any underlying cause for shortness, it appears to have improved significantly Speech difficulties: Seen a speech pathologist. Severe anxiety and insomnia: Currently seeing psychiatrist Dr.Jonnalagaddawho has been of tremendous help to the patient.  Severe fatigue: We will start  administering B 12 injections once a month. Severe elevation of AST and ALT: I discussed with the patient that the differential is between metastatic disease versus drug effect. Leslee Home is not known to cause elevation of AST and ALT. We are trying to get her scans done sooner. I would like to bring her back in on Thursday to recheck on the labs.  Her blood count was reviewed and ANC is 1.3  Bone metastases: Zometa Q monthly, calcium plus vitamin D, pain from the bone metastases improved with Roxicodone. It is also much improved than before. Neck pain: Palliative radiation to the neck 02/16/2017only received 5 fractions, patient decided to discontinue Esophagitis: due to radiation. It has resolved Hospitalization: Elevated ASt and ALT and required ERCP and stent. Low back pain. I gave her a prescription for fentanyl 25 g in addition to oxycodone.  If she does have progression, she would be eligible to receive Olaparib.  RTC in 31month

## 2016-10-03 ENCOUNTER — Other Ambulatory Visit: Payer: Self-pay | Admitting: Hematology and Oncology

## 2016-10-04 ENCOUNTER — Telehealth: Payer: Self-pay | Admitting: Medical

## 2016-10-04 ENCOUNTER — Inpatient Hospital Stay: Payer: Medicare Other | Admitting: Medical

## 2016-10-04 ENCOUNTER — Other Ambulatory Visit: Payer: Self-pay

## 2016-10-04 MED ORDER — ONDANSETRON HCL 8 MG PO TABS
8.0000 mg | ORAL_TABLET | Freq: Three times a day (TID) | ORAL | 0 refills | Status: DC | PRN
Start: 2016-10-04 — End: 2016-10-06

## 2016-10-04 MED ORDER — ZOLPIDEM TARTRATE 10 MG PO TABS
10.0000 mg | ORAL_TABLET | Freq: Every day | ORAL | 0 refills | Status: DC
Start: 2016-10-04 — End: 2016-10-06

## 2016-10-04 NOTE — Telephone Encounter (Signed)
No charge. 

## 2016-10-04 NOTE — Telephone Encounter (Signed)
Patient lvm 10/04/16 at 9:53am cancelling her 2pm hospital follow up due to patient not feeling well, charge or no charge

## 2016-10-06 ENCOUNTER — Encounter: Payer: Self-pay | Admitting: Hematology and Oncology

## 2016-10-06 ENCOUNTER — Ambulatory Visit (HOSPITAL_BASED_OUTPATIENT_CLINIC_OR_DEPARTMENT_OTHER): Payer: Medicare Other

## 2016-10-06 ENCOUNTER — Ambulatory Visit (HOSPITAL_BASED_OUTPATIENT_CLINIC_OR_DEPARTMENT_OTHER): Payer: Medicare Other | Admitting: Hematology and Oncology

## 2016-10-06 ENCOUNTER — Other Ambulatory Visit (HOSPITAL_BASED_OUTPATIENT_CLINIC_OR_DEPARTMENT_OTHER): Payer: Medicare Other

## 2016-10-06 ENCOUNTER — Encounter: Payer: Self-pay | Admitting: *Deleted

## 2016-10-06 VITALS — BP 111/77 | HR 108 | Temp 98.0°F | Resp 18 | Ht 62.0 in | Wt 206.6 lb

## 2016-10-06 VITALS — BP 113/75 | HR 97 | Temp 98.2°F | Resp 20

## 2016-10-06 DIAGNOSIS — D702 Other drug-induced agranulocytosis: Secondary | ICD-10-CM | POA: Diagnosis not present

## 2016-10-06 DIAGNOSIS — C50512 Malignant neoplasm of lower-outer quadrant of left female breast: Secondary | ICD-10-CM

## 2016-10-06 DIAGNOSIS — Z170 Estrogen receptor positive status [ER+]: Secondary | ICD-10-CM

## 2016-10-06 DIAGNOSIS — C773 Secondary and unspecified malignant neoplasm of axilla and upper limb lymph nodes: Secondary | ICD-10-CM

## 2016-10-06 DIAGNOSIS — G620 Drug-induced polyneuropathy: Secondary | ICD-10-CM

## 2016-10-06 DIAGNOSIS — C7951 Secondary malignant neoplasm of bone: Secondary | ICD-10-CM

## 2016-10-06 LAB — CBC WITH DIFFERENTIAL/PLATELET
BASO%: 1.4 % (ref 0.0–2.0)
Basophils Absolute: 0 10*3/uL (ref 0.0–0.1)
EOS%: 3.6 % (ref 0.0–7.0)
Eosinophils Absolute: 0.1 10*3/uL (ref 0.0–0.5)
HEMATOCRIT: 33.5 % — AB (ref 34.8–46.6)
HGB: 11.3 g/dL — ABNORMAL LOW (ref 11.6–15.9)
LYMPH#: 0.5 10*3/uL — AB (ref 0.9–3.3)
LYMPH%: 13.4 % — ABNORMAL LOW (ref 14.0–49.7)
MCH: 31.8 pg (ref 25.1–34.0)
MCHC: 33.9 g/dL (ref 31.5–36.0)
MCV: 94 fL (ref 79.5–101.0)
MONO#: 0.2 10*3/uL (ref 0.1–0.9)
MONO%: 6.6 % (ref 0.0–14.0)
NEUT#: 2.6 10*3/uL (ref 1.5–6.5)
NEUT%: 75 % (ref 38.4–76.8)
Platelets: 228 10*3/uL (ref 145–400)
RBC: 3.56 10*6/uL — AB (ref 3.70–5.45)
RDW: 14.7 % — AB (ref 11.2–14.5)
WBC: 3.5 10*3/uL — ABNORMAL LOW (ref 3.9–10.3)

## 2016-10-06 LAB — COMPREHENSIVE METABOLIC PANEL
ALT: 61 U/L — AB (ref 0–55)
AST: 52 U/L — AB (ref 5–34)
Albumin: 3.3 g/dL — ABNORMAL LOW (ref 3.5–5.0)
Alkaline Phosphatase: 176 U/L — ABNORMAL HIGH (ref 40–150)
Anion Gap: 10 mEq/L (ref 3–11)
BUN: 6.7 mg/dL — AB (ref 7.0–26.0)
CALCIUM: 9.5 mg/dL (ref 8.4–10.4)
CHLORIDE: 106 meq/L (ref 98–109)
CO2: 24 meq/L (ref 22–29)
CREATININE: 0.8 mg/dL (ref 0.6–1.1)
EGFR: 85 mL/min/{1.73_m2} — ABNORMAL LOW (ref >90)
GLUCOSE: 152 mg/dL — AB (ref 70–140)
POTASSIUM: 3.8 meq/L (ref 3.5–5.1)
SODIUM: 140 meq/L (ref 136–145)
Total Bilirubin: 1.52 mg/dL — ABNORMAL HIGH (ref 0.20–1.20)
Total Protein: 7.1 g/dL (ref 6.4–8.3)

## 2016-10-06 MED ORDER — HEPARIN SOD (PORK) LOCK FLUSH 100 UNIT/ML IV SOLN
500.0000 [IU] | Freq: Once | INTRAVENOUS | Status: AC | PRN
  Administered 2016-10-06: 500 [IU] via INTRAVENOUS
  Filled 2016-10-06: qty 5

## 2016-10-06 MED ORDER — CYANOCOBALAMIN 1000 MCG/ML IJ SOLN
INTRAMUSCULAR | Status: AC
  Filled 2016-10-06: qty 1

## 2016-10-06 MED ORDER — OMEPRAZOLE 20 MG PO CPDR: 20.0000 mg | DELAYED_RELEASE_CAPSULE | Freq: Every day | ORAL | 3 refills | Status: AC

## 2016-10-06 MED ORDER — CYANOCOBALAMIN 1000 MCG/ML IJ SOLN
1000.0000 ug | Freq: Once | INTRAMUSCULAR | Status: AC
  Administered 2016-10-06: 1000 ug via INTRAMUSCULAR

## 2016-10-06 MED ORDER — FENTANYL 75 MCG/HR TD PT72: 75.0000 ug | MEDICATED_PATCH | TRANSDERMAL | 0 refills | Status: DC

## 2016-10-06 MED ORDER — ONDANSETRON HCL 8 MG PO TABS: 8.0000 mg | ORAL_TABLET | Freq: Three times a day (TID) | ORAL | 0 refills | Status: AC | PRN

## 2016-10-06 MED ORDER — ZOLPIDEM TARTRATE 10 MG PO TABS: 10.0000 mg | ORAL_TABLET | Freq: Every day | ORAL | 0 refills | Status: AC

## 2016-10-06 MED ORDER — ZOLEDRONIC ACID 4 MG/5ML IV CONC
4.0000 mg | Freq: Once | INTRAVENOUS | Status: AC
  Administered 2016-10-06: 4 mg via INTRAVENOUS
  Filled 2016-10-06: qty 5

## 2016-10-06 MED ORDER — SODIUM CHLORIDE 0.9 % IJ SOLN
10.0000 mL | INTRAMUSCULAR | Status: DC | PRN
  Administered 2016-10-06: 10 mL via INTRAVENOUS
  Filled 2016-10-06: qty 10

## 2016-10-06 NOTE — Progress Notes (Signed)
[place] Work  Clinical Social Work was referred by patient for assessment of psychosocial needs due to issues with SCAT.  Clinical Social Worker met with patient at [facility] during treatment to offer support and assess for needs.  Pt reports she had applied for SCAT, but has not had confirmation of approval to date. Pt to reach out to SCAT on Monday as offices closed today. Pt agrees to reach out to [place] team if other assistance is needed. Pt eager to have SCAT in place in order to attend medical appts and support programs.   Clinical Social Work interventions: Resource education  Loren Racer, [place] Worker [place]  [place] Phone: [phone] Fax: [phone]

## 2016-10-06 NOTE — Patient Instructions (Signed)

## 2016-10-06 NOTE — Progress Notes (Signed)
Patient Care Team: Mackie Pai, PA-C as PCP - General (Physician Assistant) Thea Silversmith, MD as Consulting Physician (Radiation Oncology) Marcy Panning, MD as Consulting Physician (Hematology and Oncology) Nicholas Lose, MD as Consulting Physician (Hematology and Oncology)  DIAGNOSIS:  Encounter Diagnoses  Name Primary?  . Bone metastasis ([place]) Yes  . 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      07/03/2011 - 08/17/2011 Radiation Therapy    Radiation therapy to the chest and axilla      09/11/2011 -  Anti-estrogen oral therapy    Tamoxifen later switched to Arimidex 07/2012      04/22/2012 Surgery    Bilateral salpingo-oophorectomy      06/10/2015 Imaging    Bone scan: uptake right lateral frontal bone, left malar region, manubrium, right 6,8 ribs; CT-CAP: New extensive infiltrate left & right hilar/mediastinal, right axillary, bil lower neck LN, 3.7 cm LUL consolidation, RML nodules,Lt Pl eff, RPLN      06/18/2015 Initial Biopsy    Right axillary lymph node biopsy: 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, ER 90%, PR 0%, Ki-67 60%, HER-2 negative      06/29/2015 - 10/19/2015 Chemotherapy    Halaven days 1 and 8 Q 3 weeks 6 cycles      11/01/2015 PET scan    Focal patchy LUL opacities new/increased possibly reflecting infection or tumor, mild improving hypermet LN in Rt paratracheal and left  infrahilar region, subcut mets to right shoulder and right post gluteal, ext bone mets      11/08/2015 -  Anti-estrogen oral therapy    Ibrance with Letrozole      12/30/2015 - 01/07/2016 Radiation Therapy    Palliative radiation to the neck (stopped early due to esophagitis)      09/05/2016 - 09/25/2016 Hospital Admission    Elevated Liver enzymes and Abdominal pain. Scans did not show disease progression       CHIEF COMPLIANT: Posthospitalization follow-up on Ibrance letrozole  INTERVAL HISTORY: Tina Vaughan is a 55 year old with above-mentioned history of metastatic breast cancer was admitted to the hospital with marked elevation of AST and ALT and severe jaundice. She was found to have an obstruction of the biliary system most likely from cancer. She underwent stent placement and she developed pancreatitis as well. Her symptoms including the jaundice have improved significantly and she was able to be discharged. She was seen by palliative care in the hospital and her pain medications were adjusted. She currently takes 75 g of fentanyl. She also takes 30 mg of oxycodone however it was felt to be too much and today I would reduce the dosage of oxycodone.  REVIEW OF SYSTEMS:   Constitutional: Denies fevers, chills or abnormal weight loss Eyes: Denies blurriness of vision Ears, nose, mouth, throat, and face: Denies mucositis or sore throat Respiratory: Denies cough, dyspnea or wheezes Cardiovascular: Denies palpitation, chest discomfort  Gastrointestinal:  Denies nausea, heartburn or change in bowel habits Skin: Denies abnormal skin rashes Lymphatics: Denies new lymphadenopathy or easy bruising Neurological:Denies numbness, tingling or new weaknesses Behavioral/Psych: Mood is stable, no new changes  Extremities: No lower extremity edema  All other systems were reviewed with the patient and are negative.  I have reviewed the past medical history, past surgical history, social history  and family history with the patient and they are unchanged from previous note.  ALLERGIES:  is allergic to doxycycline hydrochloride.  MEDICATIONS:  Current Outpatient Prescriptions  Medication Sig Dispense Refill  . buPROPion (WELLBUTRIN XL) 300 MG 24 hr tablet Take 300 mg by mouth daily.    . calcium carbonate (OSCAL) 1500 (600 Ca) MG TABS tablet Take 600 mg of elemental calcium by mouth daily.    . cholecalciferol (VITAMIN D) 1000 UNITS tablet Take 1,000 Units by mouth daily.     . citalopram (CELEXA) 40 MG tablet Take 10 mg by mouth daily.    . fentaNYL (DURAGESIC - DOSED MCG/HR) 75 MCG/HR Place 1 patch (75 mcg total) onto the skin every 3 (three) days. 5 patch 0  . fluocinonide cream (LIDEX) 9.62 % Apply 1 application topically 2 (two) times daily as needed (for rash.).    Marland Kitchen gabapentin (NEURONTIN) 300 MG capsule Take 300 mg by mouth 3 (three) times daily.    Marland Kitchen ibuprofen (ADVIL,MOTRIN) 200 MG tablet Take 600 mg by mouth every 8 (eight) hours as needed for mild pain or moderate pain.     Marland Kitchen letrozole (FEMARA) 2.5 MG tablet Take 2.5 mg by mouth daily.    Marland Kitchen lidocaine-prilocaine (EMLA) cream Apply 1 application topically as needed (prior to accessing port).    . LORazepam (ATIVAN) 1 MG tablet Take 1-2 mg by mouth 2 (two) times daily. Pt takes one tablet in the morning and two at bedtime.    Marland Kitchen omeprazole (PRILOSEC) 20 MG capsule Take 20 mg by mouth daily.    . ondansetron (ZOFRAN) 8 MG tablet Take 1 tablet (8 mg total) by mouth every 8 (eight) hours as needed for nausea or vomiting. 20 tablet 0  . oxyCODONE (ROXICODONE) 30 MG immediate release tablet Please take half tablet to 1 tablet every 4 hours as needed for pain. 30 tablet 0  . polyethylene glycol (MIRALAX / GLYCOLAX) packet Take 34 g by mouth 2 (two) times daily. 14 each 0  . senna (SENOKOT) 8.6 MG TABS tablet Take 2 tablets (17.2 mg total) by mouth at bedtime. 120 each 0  . zolpidem (AMBIEN) 10 MG tablet Take 1 tablet (10 mg total) by  mouth at bedtime. 30 tablet 0   No current facility-administered medications for this visit.     PHYSICAL EXAMINATION: ECOG PERFORMANCE STATUS: 2 - Symptomatic, <50% confined to bed  Vitals:   10/06/16 0952  BP: 111/77  Pulse: (!) 108  Resp: 18  Temp: 98 F (36.7 C)   Filed Weights   10/06/16 0952  Weight: 206 lb 9.6 oz (93.7 kg)    GENERAL:alert, no distress and comfortable SKIN: skin color, texture, turgor are normal, no rashes or significant lesions EYES: normal, Conjunctiva are pink and non-injected, sclera clear OROPHARYNX:no exudate, no erythema and lips, buccal mucosa, and tongue normal  NECK: supple, thyroid normal size, non-tender, without nodularity LYMPH:  no palpable lymphadenopathy in the cervical, axillary or inguinal LUNGS: clear to auscultation and percussion with normal breathing effort HEART: regular rate & rhythm and no murmurs and no lower extremity edema  ABDOMEN:abdomen soft, non-tender and normal bowel sounds MUSCULOSKELETAL:no cyanosis of digits and no clubbing  NEURO: alert & oriented x 3 with fluent speech, , generalized weakness and patient's been using a wheelchair to go outside the house. EXTREMITIES: No lower extremity edema  LABORATORY DATA:  I have reviewed the data as listed   Chemistry      Component Value Date/Time   NA 140 10/06/2016 0926   K 3.8 10/06/2016 0926   CL 104 09/22/2016 0540   CL 108 (H) 07/30/2012 1430   CO2 24 10/06/2016 0926   BUN 6.7 (L) 10/06/2016 0926   CREATININE 0.8 10/06/2016 0926      Component Value Date/Time   CALCIUM 9.5 10/06/2016 0926   ALKPHOS 176 (H) 10/06/2016 0926   AST 52 (H) 10/06/2016 0926   ALT 61 (H) 10/06/2016 0926   BILITOT 1.52 (H) 10/06/2016 0926       Lab Results  Component Value Date   WBC 3.5 (L) 10/06/2016   HGB 11.3 (L) 10/06/2016   HCT 33.5 (L) 10/06/2016   MCV 94.0 10/06/2016   PLT 228 10/06/2016   NEUTROABS 2.6 10/06/2016    ASSESSMENT & PLAN:  Breast cancer of  lower-outer quadrant of left female breast ([place]) Right axillary lymph node biopsy 06/18/2015: 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 BRCA 2 mutation underwent salpingo-oophorectomy Status post bilateral mastectomies and radiation to left chest wall and axilla currently on tamoxifen since October 2012) Prior treatment: Halaven day 1 and day 8 every 3 weeks 6 cycles started 06/29/2015 and completed 10/19/2015 PET/CT scan 11/01/2015:Focal patchy LUL opacities new/increased possibly reflecting infection or tumor, mild improving hypermet LN in Rt paratracheal and left infrahilar region, subcut mets to right shoulder and right post gluteal, ext bone mets CT angiogram 12/19/2015: No PE slight interval worsening of consolidative opacities within the left upper lobe ------------------------------------------------------------------------------------------------------------------------------------------------------- Current treatment: letrozole with Ibrance along with Zometa started 11/12/2015  Ibrance toxicities: 1. Peripheral Neuropathy 2. Neutropenia: grade 1: Did not require dose reduction. 3. Hot flashes related to letrozole  4. Nausea: Takes Zofran when necessary  Shortness of breath: CT scans do not reveal any underlying cause for shortness, it appears to have improved significantly Speech difficulties: Seen a speech pathologist. Severe anxiety and insomnia: Currently seeing psychiatrist Dr.Jonnalagaddawho has been of tremendous help to  the patient.  Severe fatigue: We will start administering B 12 injections once a month. Severe elevation of AST and ALT: I discussed with the patient that the differential is between metastatic disease versus drug effect. Leslee Home is not known to cause elevation of AST and ALT. We are trying to get her scans done sooner. I would like to bring her back in on Thursday to recheck on the labs.  Her blood count was reviewed and ANC is 1.3  Bone metastases: Zometa Q monthly, calcium plus vitamin D, pain from the bone metastases improved with Roxicodone. It is also much improved than before. Neck pain: Palliative radiation to the neck 02/16/2017only received 5 fractions, patient decided to discontinue Esophagitis: due to radiation. It has  resolved Hospitalization: Elevated ASt and ALT and required ERCP and stent.She improved significantly after the stent was placed. Today her liver function tests showed a bilirubin of 1.5 and AST and ALT are significantly lower.  Low back pain. In the hospital she was put on fentanyl 75 g. I renewed the prescription today. I also gave her prescription for oxycodone at the lower dose of 15 mg every 8 hours as needed.  Our plan is to see her back in 4 weeks with recheck of the blood work. We will also obtain scans in 2 months from now.  If she does have progression, she would be eligible to receive Olaparib.  RTC in 63month  No orders of the defined types were placed in this encounter.  The patient has a good understanding of the overall plan. she agrees with it. she will call with any problems that may develop before the next visit here.   GRulon Eisenmenger MD 10/06/16

## 2016-10-10 ENCOUNTER — Encounter: Payer: Self-pay | Admitting: *Deleted

## 2016-10-10 NOTE — Progress Notes (Signed)
[place] Work  [employer] representative received phone call from patient stating she was having difficulties with her SCAT application.  CSW contacted SCAT to discuss patients application.  SCAT requested Part B of the application be resubmitted.  CSW complete Part B and faxed to SCAT.  SCAT will contact patient once she is approved.  CSW informed patient of the status of the application and patient verbalized understanding.  CSW encouraged patient to call with any additional needs or concerns.    Johnnye Lana, MSW, LCSW, OSW-C Clinical Social Worker [facility] [phone]

## 2016-10-16 ENCOUNTER — Other Ambulatory Visit: Payer: Self-pay | Admitting: Hematology and Oncology

## 2016-10-16 DIAGNOSIS — R748 Abnormal levels of other serum enzymes: Secondary | ICD-10-CM | POA: Diagnosis not present

## 2016-10-16 DIAGNOSIS — R932 Abnormal findings on diagnostic imaging of liver and biliary tract: Secondary | ICD-10-CM | POA: Diagnosis not present

## 2016-10-17 ENCOUNTER — Telehealth: Payer: Self-pay

## 2016-10-17 NOTE — Telephone Encounter (Signed)
Pt called to verify her rx will be ready for pickup tomorrow. LVM that rx was printed 12/5 so should be ready for pickup.

## 2016-10-18 ENCOUNTER — Telehealth: Payer: Self-pay | Admitting: Medical

## 2016-10-18 NOTE — Telephone Encounter (Signed)
Patient would like me to call back in the morning to schedule medicare wellness in January 2018.

## 2016-10-19 ENCOUNTER — Encounter: Payer: Self-pay | Admitting: *Deleted

## 2016-10-19 NOTE — Telephone Encounter (Signed)
Reached out to patient unable to leave voicemail.

## 2016-10-19 NOTE — Progress Notes (Signed)
[place] Work  Clinical Social Work was referred by need to follow up with patient re SCAT. Clinical Social Worker contacted patient at home to offer support and assess for needs.  Pt reports SCAT is all set up and ready to use. No other needs noted.     Clinical Social Work interventions:  Film/video editor in [employer], [place] Social Worker [place]  [place] Phone: [phone] Fax: [phone]

## 2016-10-19 NOTE — Progress Notes (Signed)
Pt came to pick up fentanyl patch prescription.

## 2016-10-20 ENCOUNTER — Telehealth: Payer: Self-pay

## 2016-10-20 NOTE — Telephone Encounter (Signed)
Pt called with symptoms of nausea and bloating. Pt states that since she saw Dr.Gudena, she has developed some nausea and bloating over the past few days. She states that she had been taking zofran po every 8hrs and has not relieved her symptoms. Pt states that she would eat very little and would feel very bloated and starts to develop abdominal discomfort. She started taking gas x last night and it did help her. She states that she was also taking stool softeners and had a small bowel movement today. Told pt that she can break up her meals into 6 small meals instead of 3 big meals per day. Encouraged pt to increase her water intake to help flush and move her bowels better. Pt denies any vomiting at this time. Told pt to keep taking her gas x if it helps with her symptoms. Told pt that she could still be constipated with taking the zofran on the clock and all the other pain medication she takes at home, so water is very important in flushing out her system, along with taking her stool softeners. Pt states that she had been eating some sugary foods and it immediately made her bloated. Advised to refrain from eating anything with sugar, fat, grease and dairy to help lessen gas build up. Pt denies vomiting at this time. Told pt that she can also try to increase her prilosec to 40mg  before food to help with nausea.Pt wrote and read back nurse suggestions. Told pt to call back Monday if symptoms persist or go to the ED if abdominal pain she has uncontrolled n/v over the weekend. Pt verbalized understanding and will call back for any other concerns.

## 2016-10-23 ENCOUNTER — Emergency Department (HOSPITAL_COMMUNITY)
Admission: EM | Admit: 2016-10-23 | Discharge: 2016-10-23 | Disposition: A | Discharge status: Home / Self Care | Payer: Medicare Other | Attending: Emergency Medicine | Admitting: Emergency Medicine

## 2016-10-23 ENCOUNTER — Telehealth: Payer: Self-pay | Admitting: *Deleted

## 2016-10-23 ENCOUNTER — Emergency Department (HOSPITAL_COMMUNITY): Payer: Medicare Other

## 2016-10-23 ENCOUNTER — Encounter (HOSPITAL_COMMUNITY): Payer: Self-pay | Admitting: Emergency Medicine

## 2016-10-23 DIAGNOSIS — R0600 Dyspnea, unspecified: Secondary | ICD-10-CM | POA: Diagnosis not present

## 2016-10-23 DIAGNOSIS — R0602 Shortness of breath: Secondary | ICD-10-CM | POA: Insufficient documentation

## 2016-10-23 DIAGNOSIS — Z853 Personal history of malignant neoplasm of breast: Secondary | ICD-10-CM | POA: Insufficient documentation

## 2016-10-23 LAB — COMPREHENSIVE METABOLIC PANEL
ALBUMIN: 3.9 g/dL (ref 3.5–5.0)
ALT: 38 U/L (ref 14–54)
AST: 43 U/L — AB (ref 15–41)
Alkaline Phosphatase: 126 U/L (ref 38–126)
Anion gap: 8 (ref 5–15)
BUN: 13 mg/dL (ref 6–20)
CO2: 24 mmol/L (ref 22–32)
CREATININE: 0.76 mg/dL (ref 0.44–1.00)
Calcium: 9.2 mg/dL (ref 8.9–10.3)
Chloride: 106 mmol/L (ref 101–111)
GFR calc Af Amer: >60 mL/min (ref >60)
GFR calc non Af Amer: >60 mL/min (ref >60)
GLUCOSE: 121 mg/dL — AB (ref 65–99)
Potassium: 3.8 mmol/L (ref 3.5–5.1)
SODIUM: 138 mmol/L (ref 135–145)
Total Bilirubin: 1.1 mg/dL (ref 0.3–1.2)
Total Protein: 7 g/dL (ref 6.5–8.1)

## 2016-10-23 LAB — CBC WITH DIFFERENTIAL/PLATELET
BASOS ABS: 0 10*3/uL (ref 0.0–0.1)
BASOS PCT: 0 %
EOS PCT: 2 %
Eosinophils Absolute: 0 10*3/uL (ref 0.0–0.7)
HCT: 33.1 % — ABNORMAL LOW (ref 36.0–46.0)
Hemoglobin: 11.4 g/dL — ABNORMAL LOW (ref 12.0–15.0)
LYMPHS PCT: 19 %
Lymphs Abs: 0.4 10*3/uL — ABNORMAL LOW (ref 0.7–4.0)
MCH: 32.1 pg (ref 26.0–34.0)
MCHC: 34.4 g/dL (ref 30.0–36.0)
MCV: 93.2 fL (ref 78.0–100.0)
MONO ABS: 0.1 10*3/uL (ref 0.1–1.0)
Monocytes Relative: 5 %
Neutro Abs: 1.7 10*3/uL (ref 1.7–7.7)
Neutrophils Relative %: 74 %
PLATELETS: 190 10*3/uL (ref 150–400)
RBC: 3.55 MIL/uL — AB (ref 3.87–5.11)
RDW: 14.3 % (ref 11.5–15.5)
WBC: 2.2 10*3/uL — AB (ref 4.0–10.5)

## 2016-10-23 LAB — BRAIN NATRIURETIC PEPTIDE: B Natriuretic Peptide: 8.7 pg/mL (ref 0.0–100.0)

## 2016-10-23 LAB — TROPONIN I

## 2016-10-23 MED ORDER — IOPAMIDOL (ISOVUE-370) INJECTION 76%
100.0000 mL | Freq: Once | INTRAVENOUS | Status: AC | PRN
  Administered 2016-10-23: 100 mL via INTRAVENOUS

## 2016-10-23 MED ORDER — IOPAMIDOL (ISOVUE-370) INJECTION 76%
INTRAVENOUS | Status: AC
  Filled 2016-10-23: qty 100

## 2016-10-23 MED ORDER — SODIUM CHLORIDE 0.9 % IJ SOLN
INTRAMUSCULAR | Status: AC
  Filled 2016-10-23: qty 50

## 2016-10-23 MED ORDER — OXYCODONE HCL 5 MG PO TABS
15.0000 mg | ORAL_TABLET | Freq: Once | ORAL | Status: AC
  Administered 2016-10-23: 15 mg via ORAL
  Filled 2016-10-23: qty 3

## 2016-10-23 MED ORDER — SODIUM CHLORIDE 0.9 % IV SOLN
INTRAVENOUS | Status: DC
  Administered 2016-10-23: 20 mL/h via INTRAVENOUS

## 2016-10-23 MED ORDER — HEPARIN SOD (PORK) LOCK FLUSH 100 UNIT/ML IV SOLN
INTRAVENOUS | Status: AC
  Administered 2016-10-23: 500 [IU]
  Filled 2016-10-23: qty 5

## 2016-10-23 MED ORDER — ONDANSETRON HCL 4 MG PO TABS
8.0000 mg | ORAL_TABLET | Freq: Once | ORAL | Status: AC
  Administered 2016-10-23: 8 mg via ORAL
  Filled 2016-10-23: qty 2

## 2016-10-23 NOTE — ED Triage Notes (Signed)
Pt reports shortness of breath , breast cancer, oral chemo daily. Pt denies chest pain. denies cough nor urinary symptoms. No fever noted in triage.

## 2016-10-23 NOTE — ED Provider Notes (Signed)
[facility] DEPT Provider Note   CSN: [id number] Arrival date & time: 10/23/16  1105     History   Chief Complaint Chief Complaint  Patient presents with  . Shortness of Breath    chemo    HPI Tina Vaughan is a 55 y.o. female.  55 year old female with history of metastatic breast cancer stage IV who presents with increasing dyspnea which awoke her from her sleep. Denied any associated chest pain or chest pressure. No leg pain or swelling. No recent history of blood loss. She is currently taking oral chemotherapy daily. No reported fever or chills. Vomiting or diarrhea. Denies any orthopnea but has had some dyspnea on exertion. Does have a history of sleep apnea and uses a C Pap machine. No tubing use prior to arrival      Past Medical History:  Diagnosis Date  . Anxiety   . Breast cancer, stage 3 ([place]) 2012/ 05/2015   left; inflammatory  . Depression   . Eczema   . Frequent urination   . GERD (gastroesophageal reflux disease)   . Headache   . Lymphedema   . Neuromuscular disorder ([place])   . 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 Obesity   . Sleep apnea   . Thyroid disease    Possible would like to be evaluated    Patient Active Problem List   Diagnosis Date Noted  . Neoplasm related pain   . Biliary obstruction   . Displacement of biliary stent   . Jaundice   . Abdominal pain, epigastric   . Constipation   . Common bile duct stricture   . 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 Acute pancreatitis without necrosis or infection, unspecified 09/15/2016  . Right upper quadrant abdominal pain   . Encounter for palliative care   . Depression 09/08/2016  . Abnormal liver function 09/08/2016  . Abdominal pain 09/08/2016  . Diarrhea 09/08/2016  . Speech impairment 03/09/2016  . Encounter for chemotherapy management 11/10/2015  . Bone metastasis ([place]) 11/02/2015  . Morbid obesity ([place]) 08/28/2015  . Shortness of breath 08/04/2015  .  Dyspnea 08/04/2015  . Tachycardia 08/04/2015  . Hyperventilation 08/04/2015  . Respiratory rate increased 08/04/2015  . Chemotherapy induced nausea and vomiting 07/13/2015  . Nausea without vomiting 06/08/2015  . Special screening for malignant neoplasms, colon 06/08/2015  . Bloating 06/08/2015  . Early satiety 06/08/2015  . Pain in the abdomen 06/04/2015  . Generalized anxiety disorder 05/21/2015  . Fatigue 05/21/2015  . Sleep apnea 05/21/2015  . Headache 10/22/2014  . Lymphedema of upper extremity 09/11/2014  . Status post prophylactic bilateral salpingo-oophorectomy for BRCA 03/19/2012  . Breast cancer of lower-outer quadrant of left female breast ([place]) 05/18/2011    Past Surgical History:  Procedure Laterality Date  . ERCP N/A 09/13/2016   Procedure: ENDOSCOPIC RETROGRADE CHOLANGIOPANCREATOGRAPHY (ERCP);  Surgeon: Clarene Essex, MD;  Location: [facility];  Service: Endoscopy;  Laterality: N/A;  . ERCP N/A 09/18/2016   Procedure: ENDOSCOPIC RETROGRADE CHOLANGIOPANCREATOGRAPHY (ERCP);  Surgeon: Clarene Essex, MD;  Location: [facility];  Service: Endoscopy;  Laterality: N/A;  . LAPAROSCOPY  03/14/2012   Procedure: LAPAROSCOPY OPERATIVE;  Surgeon: Osborne Oman, MD;  Location: [facility];  Service: Gynecology;  Laterality: N/A;  . MASTECTOMY Bilateral 05/12/11   total mastectomy on right, radical mastectomy on left  . OOPHORECTOMY    . PORT-A-CATH REMOVAL  04/19/2012   Procedure: MINOR REMOVAL PORT-A-CATH;  Surgeon: Haywood Lasso,  MD;  Location: [facility];  Service: General;  Laterality: N/A;  . Portacath     Placement   . PORTACATH PLACEMENT    . SALPINGOOPHORECTOMY  03/14/2012   Procedure: SALPINGO OOPHERECTOMY;  Surgeon: Osborne Oman, MD;  Location: [facility];  Service: Gynecology;  Laterality: Bilateral;    OB History    Gravida Para Term Preterm AB Living   1       1     SAB TAB Ectopic Multiple Live Births     1             Home Medications    Prior  to Admission medications   Medication Sig Start Date End Date Taking? Authorizing Provider  buPROPion (WELLBUTRIN XL) 300 MG 24 hr tablet Take 300 mg by mouth daily.    Historical Provider, MD  calcium carbonate (OSCAL) 1500 (600 Ca) MG TABS tablet Take 600 mg of elemental calcium by mouth daily.    Historical Provider, MD  cholecalciferol (VITAMIN D) 1000 UNITS tablet Take 1,000 Units by mouth daily.     Historical Provider, MD  citalopram (CELEXA) 40 MG tablet Take 10 mg by mouth daily.    Historical Provider, MD  fentaNYL (DURAGESIC - DOSED MCG/HR) 75 MCG/HR APPLY 1 PATCH TO SKIN AND REPLACE EVERY 3 DAYS. 10/17/16   Nicholas Lose, MD  fluocinonide cream (LIDEX) 9.47 % Apply 1 application topically 2 (two) times daily as needed (for rash.).    Historical Provider, MD  gabapentin (NEURONTIN) 300 MG capsule Take 300 mg by mouth 3 (three) times daily.    Historical Provider, MD  ibuprofen (ADVIL,MOTRIN) 200 MG tablet Take 600 mg by mouth every 8 (eight) hours as needed for mild pain or moderate pain.     Historical Provider, MD  letrozole (FEMARA) 2.5 MG tablet Take 2.5 mg by mouth daily.    Historical Provider, MD  lidocaine-prilocaine (EMLA) cream Apply 1 application topically as needed (prior to accessing port).    Historical Provider, MD  omeprazole (PRILOSEC) 20 MG capsule Take 1 capsule (20 mg total) by mouth daily. 10/06/16   Nicholas Lose, MD  ondansetron (ZOFRAN) 8 MG tablet Take 1 tablet (8 mg total) by mouth every 8 (eight) hours as needed for nausea or vomiting. 10/06/16   Nicholas Lose, MD  oxyCODONE (ROXICODONE) 30 MG immediate release tablet Please take half tablet to 1 tablet every 4 hours as needed for pain. 09/22/16   Silver Huguenin Elgergawy, MD  polyethylene glycol (MIRALAX / GLYCOLAX) packet Take 34 g by mouth 2 (two) times daily. 09/22/16   Silver Huguenin Elgergawy, MD  senna (SENOKOT) 8.6 MG TABS tablet Take 2 tablets (17.2 mg total) by mouth at bedtime. 09/22/16   Silver Huguenin Elgergawy, MD    zolpidem (AMBIEN) 10 MG tablet Take 1 tablet (10 mg total) by mouth at bedtime. 10/06/16   Nicholas Lose, MD    Family History Family History  Problem Relation Age of Onset  . Breast cancer Mother   . Pancreatic cancer Mother     pancreatic cancer x 2  . Diabetes Mother     after pancreas removed  . Stroke Father     heavy smoker  . Hypertension Father   . Heart disease Father   . Healthy Sister   . Migraines Sister   . Emphysema Paternal Grandfather     smoked a pipe  . Colon cancer Neg Hx   . Esophageal cancer Neg Hx   .  Colitis Neg Hx   . Crohn's disease Neg Hx     Social History Social History  Substance Use Topics  . Smoking status: Never Smoker  . Smokeless tobacco: Never Used  . Alcohol use 0.0 oz/week     Comment: rare     Allergies   Doxycycline hydrochloride   Review of Systems Review of Systems  All other systems reviewed and are negative.    Physical Exam Updated Vital Signs BP 114/83   Pulse 100   Temp 97.8 F (36.6 C)   Resp 24   LMP 05/08/2016 Comment: pt signed preg test waiver today 09/20/16  SpO2 100%   Physical Exam  Constitutional: She is oriented to person, place, and time. She appears well-developed and well-nourished.  Non-toxic appearance. No distress.  HENT:  Head: Normocephalic and atraumatic.  Eyes: Conjunctivae, EOM and lids are normal. Pupils are equal, round, and reactive to light.  Neck: Normal range of motion. Neck supple. No tracheal deviation present. No thyroid mass present.  Cardiovascular: Normal rate, regular rhythm and normal heart sounds.  Exam reveals no gallop.   No murmur heard. Pulmonary/Chest: Effort normal and breath sounds normal. No stridor. No respiratory distress. She has no decreased breath sounds. She has no wheezes. She has no rhonchi. She has no rales.  Abdominal: Soft. Normal appearance and bowel sounds are normal. She exhibits no distension. There is no tenderness. There is no rebound and no CVA  tenderness.  Musculoskeletal: Normal range of motion. She exhibits no edema or tenderness.  Neurological: She is alert and oriented to person, place, and time. She has normal strength. No cranial nerve deficit or sensory deficit. GCS eye subscore is 4. GCS verbal subscore is 5. GCS motor subscore is 6.  Skin: Skin is warm and dry. No abrasion and no rash noted.  Psychiatric: She has a normal mood and affect. Her speech is normal and behavior is normal.  Nursing note and vitals reviewed.    ED Treatments / Results  Labs (all labs ordered are listed, but only abnormal results are displayed) Labs Reviewed - No data to display  EKG  EKG Interpretation None       Radiology No results found.  Procedures Procedures (including critical care time)  Medications Ordered in ED Medications - No data to display   Initial Impression / Assessment and Plan / ED Course  I have reviewed the triage vital signs and the nursing notes.  Pertinent labs & imaging results that were available during my care of the patient were reviewed by me and considered in my medical decision making (see chart for details).  Clinical Course     Patient's workup here was significant for mild leukopenia which patient has a history of due to current treatment. CT of the chest was negative for PE. EKG and cardiac markers are negative. Patient states that her voice is unchanged from prior. Patient be discharged with follow-up precautions.   Final Clinical Impressions(s) / ED Diagnoses   Final diagnoses:  None    New Prescriptions New Prescriptions   No medications on file     Lacretia [name], MD 10/23/16 1538

## 2016-10-23 NOTE — Telephone Encounter (Signed)
Patient called @10 :17 and left message.  Called patient back at 10:30 and spoke with patient.  She has been having SOB at night, but she woke up this morning with SOB.  Let her know that she needs to go to the ED.  She is by herself, but she thinks she can get someone to come and take her.  Let her know that she is unable to find someone she will need to call 911.  Pt. Agrees. Let her know I will check on her in 5 minutes to make sure she has a ride. 1040.  Spoke with patient.  Her sister is on the way and they can be at the ED in 10-15 minutes.  Made sure her door is unlocked.   Let her know that if her SOB gets worse to call 911.  I will check on her in 10 minutes to make sure her sister arrived and she is on her way. 1052.   Spoke with patient.  Sister is there and they are on their way to ED.  She does not think she needs 911.

## 2016-10-23 NOTE — Telephone Encounter (Signed)
Thank you, will f/u with pt when home.

## 2016-10-23 NOTE — ED Notes (Signed)
Nurse is going to access patient port to collect labs

## 2016-10-26 ENCOUNTER — Other Ambulatory Visit: Payer: Self-pay

## 2016-10-26 DIAGNOSIS — Z170 Estrogen receptor positive status [ER+]: Principal | ICD-10-CM

## 2016-10-26 DIAGNOSIS — C7951 Secondary malignant neoplasm of bone: Secondary | ICD-10-CM

## 2016-10-26 DIAGNOSIS — C50512 Malignant neoplasm of lower-outer quadrant of left female breast: Secondary | ICD-10-CM

## 2016-10-26 MED ORDER — PROCHLORPERAZINE MALEATE 10 MG PO TABS: 10.0000 mg | ORAL_TABLET | Freq: Four times a day (QID) | ORAL | 0 refills | Status: AC | PRN

## 2016-10-26 NOTE — Progress Notes (Signed)
Pt called back to set up ivf's tomorrow morning. Sent high priority msg to scheduling to put pt on tomorrow for SM (ivf's) at 9am. Pt unable to come today due to transportation but will arrange for a ride tomorrow AM.

## 2016-10-26 NOTE — Progress Notes (Signed)
Pt calling states that she still has n/v and zofran not helping. Will try to give pt compazine every 6-8 hrs as needed. Advised pt to not drive while on medication d/t the medicine causing drowsiness.Pt verbalized understanding and will try this nausea medication. Pt will call back to give update if compazine helps or not in the next few days. Offered to for pt to also come in for IVF's if her nausea persist and not tolerating food down. Pt states that she will call back tomorrow to let me know. No further concerns at this time.

## 2016-10-27 ENCOUNTER — Encounter: Payer: Self-pay | Admitting: Hematology and Oncology

## 2016-10-27 ENCOUNTER — Ambulatory Visit (HOSPITAL_BASED_OUTPATIENT_CLINIC_OR_DEPARTMENT_OTHER): Payer: Medicare Other | Admitting: Nurse Practitioner

## 2016-10-27 ENCOUNTER — Other Ambulatory Visit: Payer: Self-pay

## 2016-10-27 ENCOUNTER — Ambulatory Visit (HOSPITAL_BASED_OUTPATIENT_CLINIC_OR_DEPARTMENT_OTHER): Payer: Medicare Other | Admitting: Hematology and Oncology

## 2016-10-27 VITALS — BP 105/72 | HR 103 | Temp 98.5°F | Resp 18 | Ht 62.0 in | Wt 193.8 lb

## 2016-10-27 DIAGNOSIS — R1011 Right upper quadrant pain: Secondary | ICD-10-CM

## 2016-10-27 DIAGNOSIS — C50512 Malignant neoplasm of lower-outer quadrant of left female breast: Secondary | ICD-10-CM

## 2016-10-27 DIAGNOSIS — C7951 Secondary malignant neoplasm of bone: Secondary | ICD-10-CM

## 2016-10-27 DIAGNOSIS — Z170 Estrogen receptor positive status [ER+]: Principal | ICD-10-CM

## 2016-10-27 DIAGNOSIS — R112 Nausea with vomiting, unspecified: Secondary | ICD-10-CM | POA: Diagnosis present

## 2016-10-27 DIAGNOSIS — N951 Menopausal and female climacteric states: Secondary | ICD-10-CM

## 2016-10-27 DIAGNOSIS — C773 Secondary and unspecified malignant neoplasm of axilla and upper limb lymph nodes: Secondary | ICD-10-CM | POA: Diagnosis not present

## 2016-10-27 DIAGNOSIS — K831 Obstruction of bile duct: Secondary | ICD-10-CM

## 2016-10-27 DIAGNOSIS — R0602 Shortness of breath: Secondary | ICD-10-CM | POA: Diagnosis not present

## 2016-10-27 DIAGNOSIS — R110 Nausea: Secondary | ICD-10-CM | POA: Diagnosis not present

## 2016-10-27 DIAGNOSIS — G620 Drug-induced polyneuropathy: Secondary | ICD-10-CM | POA: Diagnosis not present

## 2016-10-27 DIAGNOSIS — M549 Dorsalgia, unspecified: Secondary | ICD-10-CM | POA: Diagnosis not present

## 2016-10-27 MED ORDER — ONDANSETRON HCL 4 MG/2ML IJ SOLN
INTRAMUSCULAR | Status: AC
  Filled 2016-10-27: qty 4

## 2016-10-27 MED ORDER — SODIUM CHLORIDE 0.9 % IV SOLN: 8.0000 mg | Freq: Once | INTRAVENOUS | Status: DC | PRN

## 2016-10-27 MED ORDER — SODIUM CHLORIDE 0.9 % IJ SOLN
10.0000 mL | INTRAMUSCULAR | Status: DC | PRN
  Administered 2016-10-27: 10 mL via INTRAVENOUS
  Filled 2016-10-27: qty 10

## 2016-10-27 MED ORDER — ZOLEDRONIC ACID 4 MG/5ML IV CONC: 4.0000 mg | Freq: Once | INTRAVENOUS | Status: DC

## 2016-10-27 MED ORDER — FENTANYL 75 MCG/HR TD PT72: 75.0000 ug | MEDICATED_PATCH | TRANSDERMAL | 0 refills | Status: AC

## 2016-10-27 MED ORDER — SODIUM CHLORIDE 0.9 % IV SOLN
INTRAVENOUS | Status: AC
  Administered 2016-10-27: 10:00:00 via INTRAVENOUS

## 2016-10-27 MED ORDER — PROMETHAZINE HCL 25 MG PO TABS: 25.0000 mg | ORAL_TABLET | Freq: Four times a day (QID) | ORAL | 3 refills | Status: AC | PRN

## 2016-10-27 MED ORDER — ONDANSETRON HCL 4 MG/2ML IJ SOLN
8.0000 mg | Freq: Once | INTRAMUSCULAR | Status: AC
  Administered 2016-10-27: 8 mg via INTRAVENOUS

## 2016-10-27 MED ORDER — ALBUTEROL SULFATE HFA 108 (90 BASE) MCG/ACT IN AERS: 2.0000 | INHALATION_SPRAY | Freq: Four times a day (QID) | RESPIRATORY_TRACT | 2 refills | Status: AC | PRN

## 2016-10-27 MED ORDER — LORAZEPAM 1 MG PO TABS: 1.0000 mg | ORAL_TABLET | Freq: Once | ORAL | Status: DC

## 2016-10-27 MED ORDER — HEPARIN SOD (PORK) LOCK FLUSH 100 UNIT/ML IV SOLN
500.0000 [IU] | Freq: Once | INTRAVENOUS | Status: AC | PRN
  Administered 2016-10-27: 500 [IU] via INTRAVENOUS
  Filled 2016-10-27: qty 5

## 2016-10-27 NOTE — Progress Notes (Signed)
Patient Care Team: Mackie Pai, PA-C as PCP - General (Physician Assistant) Thea Silversmith, MD as Consulting Physician (Radiation Oncology) Marcy Panning, MD as Consulting Physician (Hematology and Oncology) Nicholas Lose, MD as Consulting Physician (Hematology and Oncology)  DIAGNOSIS:  Encounter Diagnosis  Name Primary?  . 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      07/03/2011 - 08/17/2011 Radiation Therapy    Radiation therapy to the chest and axilla      09/11/2011 -  Anti-estrogen oral therapy    Tamoxifen later switched to Arimidex 07/2012      04/22/2012 Surgery    Bilateral salpingo-oophorectomy      06/10/2015 Imaging    Bone scan: uptake right lateral frontal bone, left malar region, manubrium, right 6,8 ribs; CT-CAP: New extensive infiltrate left & right hilar/mediastinal, right axillary, bil lower neck LN, 3.7 cm LUL consolidation, RML nodules,Lt Pl eff, RPLN      06/18/2015 Initial Biopsy    Right axillary lymph node biopsy: 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, ER 90%, PR 0%, Ki-67 60%, HER-2 negative      06/29/2015 - 10/19/2015 Chemotherapy    Halaven days 1 and 8 Q 3 weeks 6 cycles      11/01/2015 PET scan    Focal patchy LUL opacities new/increased possibly reflecting infection or tumor, mild improving hypermet LN in Rt paratracheal and left infrahilar region, subcut mets to  right shoulder and right post gluteal, ext bone mets      11/08/2015 -  Anti-estrogen oral therapy    Ibrance with Letrozole      12/30/2015 - 01/07/2016 Radiation Therapy    Palliative radiation to the neck (stopped early due to esophagitis)      09/05/2016 - 09/25/2016 Hospital Admission    Elevated Liver enzymes and Abdominal pain. Scans did not show disease progression       CHIEF COMPLIANT: Recent emergency room visit for shortness of breath  INTERVAL HISTORY: Tina Vaughan is a 55 year old with metastatic breast cancer who is currently on Ibrance with letrozole for the past 1 year. She was recently in the emergency room complaining of shortness of breath. She had a CT of the chest which did not show any evidence of pulmonary embolism. There was no other problems or concerns. The biliary stent appears to be working fine. She has been having intractable nausea and vomiting for the past 2 weeks. Because of how miserable she feels she has thought that her cancer is progressing and that she is actively dying. She was in tears seeing [name] today. She received IV fluids and Zofran today. She does the oral Zofran and Compazine are not doing the job for nausea. Because of nausea she has not been eating much and she thinks she is losing weight drastically. She is also having right upper quadrant abdominal discomfort.  REVIEW OF SYSTEMS:  Constitutional: Denies fevers, chills or abnormal weight loss Eyes: Denies blurriness of vision Ears, nose, mouth, throat, and face: Denies mucositis or sore throat Respiratory: Denies cough, dyspnea or wheezes Cardiovascular: Denies palpitation, chest discomfort Gastrointestinal:  Nausea and abdominal pain Skin: Denies abnormal skin rashes Lymphatics: Denies new lymphadenopathy or easy bruising Neurological:Denies numbness, tingling or new weaknesses Behavioral/Psych: Mood is stable, no new changes  Extremities: No lower extremity edema All other systems were  reviewed with the patient and are negative.  I have reviewed the past medical history, past surgical history, social history and family history with the patient and they are unchanged from previous note.  ALLERGIES:  is allergic to doxycycline hydrochloride.  MEDICATIONS:  Current Outpatient Prescriptions  Medication Sig Dispense Refill  . albuterol (PROVENTIL HFA;VENTOLIN HFA) 108 (90 Base) MCG/ACT inhaler Inhale 2 puffs into the lungs every 6 (six) hours as needed for wheezing or shortness of breath. 1 Inhaler 2  . buPROPion (WELLBUTRIN XL) 300 MG 24 hr tablet Take 300 mg by mouth daily.    . calcium carbonate (OSCAL) 1500 (600 Ca) MG TABS tablet Take 600 mg of elemental calcium by mouth daily.    . cholecalciferol (VITAMIN D) 1000 UNITS tablet Take 1,000 Units by mouth daily.     . fentaNYL (DURAGESIC - DOSED MCG/HR) 75 MCG/HR Place 1 patch (75 mcg total) onto the skin every 3 (three) days. 10 patch 0  . fluocinonide cream (LIDEX) 7.34 % Apply 1 application topically 2 (two) times daily as needed (for rash.).    Marland Kitchen gabapentin (NEURONTIN) 300 MG capsule Take 300 mg by mouth 3 (three) times daily.    Marland Kitchen ibuprofen (ADVIL,MOTRIN) 200 MG tablet Take 600 mg by mouth every 8 (eight) hours as needed for mild pain or moderate pain.     Marland Kitchen letrozole (FEMARA) 2.5 MG tablet Take 2.5 mg by mouth daily.    Marland Kitchen lidocaine-prilocaine (EMLA) cream Apply 1 application topically as needed (prior to accessing port).    Marland Kitchen omeprazole (PRILOSEC) 20 MG capsule Take 1 capsule (20 mg total) by mouth daily. (Patient taking differently: Take 40 mg by mouth daily. ) 90 capsule 3  . ondansetron (ZOFRAN) 8 MG tablet Take 1 tablet (8 mg total) by mouth every 8 (eight) hours as needed for nausea or vomiting. (Patient not taking: Reported on 10/27/2016) 20 tablet 0  . OVER THE COUNTER MEDICATION Place 1 application into both eyes daily as needed (Dry eyes).    Marland Kitchen oxyCODONE (ROXICODONE) 15 MG immediate release tablet Take 15 mg by  mouth every 4 (four) hours as needed for pain.    Marland Kitchen oxyCODONE (ROXICODONE) 30 MG immediate release tablet Please take half tablet to 1 tablet every 4 hours as needed for pain. (Patient not taking: Reported on 10/27/2016) 30 tablet 0  . palbociclib (IBRANCE) 125 MG capsule Take 125 mg by mouth daily with breakfast. Take whole with food. On 21 days off 7 days    . polyethylene glycol (MIRALAX / GLYCOLAX) packet Take 34 g by mouth 2 (two) times daily. 14 each 0  . prochlorperazine (COMPAZINE) 10 MG tablet Take 1 tablet (10 mg total) by mouth every 6 (six) hours as needed for nausea or vomiting. 30 tablet 0  . promethazine (PHENERGAN) 25 MG tablet Take 1 tablet (25 mg total) by mouth every 6 (six) hours as needed for nausea. 60 tablet 3  . senna (SENOKOT) 8.6 MG TABS tablet Take 2 tablets (17.2 mg total) by mouth at bedtime. [place]  each 0  . zolpidem (AMBIEN) 10 MG tablet Take 1 tablet (10 mg total) by mouth at bedtime. 30 tablet 0   No current facility-administered medications for this visit.    Facility-Administered Medications Ordered in Other Visits  Medication Dose Route Frequency Provider Last Rate Last Dose  . sodium chloride 0.9 % injection 10 mL  10 mL Intravenous PRN Serena Croissant, MD   10 mL at 10/27/16 1149    PHYSICAL EXAMINATION: ECOG PERFORMANCE STATUS: 2 - Symptomatic, <50% confined to bed  Vitals:   10/27/16 1200  BP: 105/72  Pulse: (!) 103  Resp: 18  Temp: 98.5 F (36.9 C)   Filed Weights   10/27/16 1200  Weight: 193 lb 12.8 oz (87.9 kg)    GENERAL:alert, no distress and comfortable SKIN: skin color, texture, turgor are normal, no rashes or significant lesions EYES: normal, Conjunctiva are pink and non-injected, sclera clear OROPHARYNX:no exudate, no erythema and lips, buccal mucosa, and tongue normal  NECK: supple, thyroid normal size, non-tender, without nodularity LYMPH:  no palpable lymphadenopathy in the cervical, axillary or inguinal LUNGS: clear to auscultation  and percussion with normal breathing effort HEART: regular rate & rhythm and no murmurs and no lower extremity edema ABDOMEN:Right upper quadrant abdominal pain MUSCULOSKELETAL:no cyanosis of digits and no clubbing  NEURO: alert & oriented x 3 with fluent speech, no focal motor/sensory deficits EXTREMITIES: No lower extremity edema   LABORATORY DATA:  I have reviewed the data as listed   Chemistry      Component Value Date/Time   NA 138 10/23/2016 1223   NA 140 10/06/2016 0926   K 3.8 10/23/2016 1223   K 3.8 10/06/2016 0926   CL 106 10/23/2016 1223   CL 108 (H) 07/30/2012 1430   CO2 24 10/23/2016 1223   CO2 24 10/06/2016 0926   BUN 13 10/23/2016 1223   BUN 6.7 (L) 10/06/2016 0926   CREATININE 0.76 10/23/2016 1223   CREATININE 0.8 10/06/2016 0926      Component Value Date/Time   CALCIUM 9.2 10/23/2016 1223   CALCIUM 9.5 10/06/2016 0926   ALKPHOS 126 10/23/2016 1223   ALKPHOS 176 (H) 10/06/2016 0926   AST 43 (H) 10/23/2016 1223   AST 52 (H) 10/06/2016 0926   ALT 38 10/23/2016 1223   ALT 61 (H) 10/06/2016 0926   BILITOT 1.1 10/23/2016 1223   BILITOT 1.52 (H) 10/06/2016 0926       Lab Results  Component Value Date   WBC 2.2 (L) 10/23/2016   HGB 11.4 (L) 10/23/2016   HCT 33.1 (L) 10/23/2016   MCV 93.2 10/23/2016   PLT 190 10/23/2016   NEUTROABS 1.7 10/23/2016    ASSESSMENT & PLAN:  Breast cancer of lower-outer quadrant of left female breast (H[facility]) Right axillary lymph node biopsy 06/18/2015: 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 BRCA 2 mutation underwent salpingo-oophorectomy Status post bilateral mastectomies and radiation to left chest wall and axilla currently on tamoxifen since October 2012) Prior treatment: Halaven day 1 and day 8 every 3 weeks 6 cycles started 06/29/2015  and completed 10/19/2015 PET/CT scan 11/01/2015:Focal patchy LUL opacities new/increased possibly reflecting infection or tumor, mild improving hypermet LN in Rt paratracheal and left infrahilar region, subcut mets to right shoulder and right post gluteal, ext bone mets CT angiogram 12/19/2015: No PE slight interval worsening of consolidative opacities within the left upper lobe ------------------------------------------------------------------------------------------------------------------------------------------------------- Current treatment: letrozole with Ibrance along with Zometa started 11/12/2015  Ibrance toxicities: 1. Peripheral Neuropathy 2. Neutropenia: grade 1: Did not require dose reduction. 3. Hot flashes related to letrozole  4. Nausea: Takes Zofran when necessary  Shortness of breath:CT angiogram in the emergency room 10/23/2016 : No PE, stable bone metastases, no liver metastases Speech difficulties: Related to her emotional stress. Severe anxiety and insomnia: Currently seeing psychiatrist Dr.Jonnalagaddawho has been of tremendous help to the patient.  Severe fatigue: Related to metastatic cancer and medications. Biliary obstruction: There is no evidence of metastatic disease in the liver. Currently the stent is working. Bilirubin and AST ALT are normal.  Bone metastases: Zometa changed to every 3 months.  Neck pain: Palliative radiation to the neck 02/16/2017only received 5 fractions, patient decided to discontinue Esophagitis: due to radiation. It has resolved Low back pain. I gave her a prescription for fentanyl 75 g in addition to oxycodone.  Today we spent 30 minutes and reassuring her that  there is no dramatic increase in activity of her breast cancer. Hence there is no indication to change of treatment. Patient has emotionally be in a train wreck assuming that she is actively dying. We spent a lot of time going over the tests and results in reassuring her that there is no indication of disease progression and that we need to stay with the same treatment.  We will push her PET/CT scan to March 2018. RTC in February for follow-up along with Zometa. Patient does not have prescription coverage and these fentanyl patches and treatments have become very expensive. I instructed her to meet with a light foundation to discuss if they have any grams available.   No orders of the defined types were placed in this encounter.  The patient has a good understanding of the overall plan. she agrees with it. she will call with any problems that may develop before the next visit here.   Rulon Eisenmenger, MD 10/27/16
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
## 2016-10-27 NOTE — Progress Notes (Signed)
Called patient to follow up on a concern she had regarding Fentanyl. There are currently no foundations available with open funds for her diagnosis/insurance. Left patient a voicemail to return my call with my contact name and number. Will also ask patient if she has applied for Medicaid.

## 2016-10-27 NOTE — Patient Instructions (Signed)
Dehydration, Adult Dehydration is a condition in which there is not enough fluid or water in the body. This happens when you lose more fluids than you take in. Important organs, such as the kidneys, brain, and heart, cannot function without a proper amount of fluids. Any loss of fluids from the body can lead to dehydration. Dehydration can range from mild to severe. This condition should be treated right away to prevent it from becoming severe. What are the causes? This condition may be caused by:  Vomiting.  Diarrhea.  Excessive sweating, such as from heat exposure or exercise.  Not drinking enough fluid, especially:  When ill.  While doing activity that requires a lot of energy.  Excessive urination.  Fever.  Infection.  Certain medicines, such as medicines that cause the body to lose excess fluid (diuretics).  Inability to access safe drinking water.  Reduced physical ability to get adequate water and food. What increases the risk? This condition is more likely to develop in people:  Who have a poorly controlled long-term (chronic) illness, such as diabetes, heart disease, or kidney disease.  Who are age 65 or older.  Who are disabled.  Who live in a place with high altitude.  Who play endurance sports. What are the signs or symptoms? Symptoms of mild dehydration may include:   Thirst.  Dry lips.  Slightly dry mouth.  Dry, warm skin.  Dizziness. Symptoms of moderate dehydration may include:   Very dry mouth.  Muscle cramps.  Dark urine. Urine may be the color of tea.  Decreased urine production.  Decreased tear production.  Heartbeat that is irregular or faster than normal (palpitations).  Headache.  Light-headedness, especially when you stand up from a sitting position.  Fainting (syncope). Symptoms of severe dehydration may include:   Changes in skin, such as:  Cold and clammy skin.  Blotchy (mottled) or pale skin.  Skin that does  not quickly return to normal after being lightly pinched and released (poor skin turgor).  Changes in body fluids, such as:  Extreme thirst.  No tear production.  Inability to sweat when body temperature is high, such as in hot weather.  Very little urine production.  Changes in vital signs, such as:  Weak pulse.  Pulse that is more than 100 beats a minute when sitting still.  Rapid breathing.  Low blood pressure.  Other changes, such as:  Sunken eyes.  Cold hands and feet.  Confusion.  Lack of energy (lethargy).  Difficulty waking up from sleep.  Short-term weight loss.  Unconsciousness. How is this diagnosed? This condition is diagnosed based on your symptoms and a physical exam. Blood and urine tests may be done to help confirm the diagnosis. How is this treated? Treatment for this condition depends on the severity. Mild or moderate dehydration can often be treated at home. Treatment should be started right away. Do not wait until dehydration becomes severe. Severe dehydration is an emergency and it needs to be treated in a hospital. Treatment for mild dehydration may include:   Drinking more fluids.  Replacing salts and minerals in your blood (electrolytes) that you may have lost. Treatment for moderate dehydration may include:   Drinking an oral rehydration solution (ORS). This is a drink that helps you replace fluids and electrolytes (rehydrate). It can be found at pharmacies and retail stores. Treatment for severe dehydration may include:   Receiving fluids through an IV tube.  Receiving an electrolyte solution through a feeding tube that is   passed through your nose and into your stomach (nasogastric tube, or NG tube).  Correcting any abnormalities in electrolytes.  Treating the underlying cause of dehydration. Follow these instructions at home:  If directed by your health care provider, drink an ORS:  Make an ORS by following instructions on the  package.  Start by drinking small amounts, about  cup (120 mL) every 5-10 minutes.  Slowly increase how much you drink until you have taken the amount recommended by your health care provider.  Drink enough clear fluid to keep your urine clear or pale yellow. If you were told to drink an ORS, finish the ORS first, then start slowly drinking other clear fluids. Drink fluids such as:  Water. Do not drink only water. Doing that can lead to having too little salt (sodium) in the body (hyponatremia).  Ice chips.  Fruit juice that you have added water to (diluted fruit juice).  Low-calorie sports drinks.  Avoid:  Alcohol.  Drinks that contain a lot of sugar. These include high-calorie sports drinks, fruit juice that is not diluted, and soda.  Caffeine.  Foods that are greasy or contain a lot of fat or sugar.  Take over-the-counter and prescription medicines only as told by your health care provider.  Do not take sodium tablets. This can lead to having too much sodium in the body (hypernatremia).  Eat foods that contain a healthy balance of electrolytes, such as bananas, oranges, potatoes, tomatoes, and spinach.  Keep all follow-up visits as told by your health care provider. This is important. Contact a health care provider if:  You have abdominal pain that:  Gets worse.  Stays in one area (localizes).  You have a rash.  You have a stiff neck.  You are more irritable than usual.  You are sleepier or more difficult to wake up than usual.  You feel weak or dizzy.  You feel very thirsty.  You have urinated only a small amount of very dark urine over 6-8 hours. Get help right away if:  You have symptoms of severe dehydration.  You cannot drink fluids without vomiting.  Your symptoms get worse with treatment.  You have a fever.  You have a severe headache.  You have vomiting or diarrhea that:  Gets worse.  Does not go away.  You have blood or green matter  (bile) in your vomit.  You have blood in your stool. This may cause stool to look black and tarry.  You have not urinated in 6-8 hours.  You faint.  Your heart rate while sitting still is over 100 beats a minute.  You have trouble breathing. This information is not intended to replace advice given to you by your health care provider. Make sure you discuss any questions you have with your health care provider. Document Released: 10/30/2005 Document Revised: 05/26/2016 Document Reviewed: 12/24/2015 Elsevier Interactive Patient Education  2017 Elsevier Inc.  

## 2016-10-27 NOTE — Progress Notes (Signed)
To see Dr. Lindi Adie after IV fluids are completed. Takne there via w/c per pt's friend.

## 2016-10-31 ENCOUNTER — Other Ambulatory Visit: Payer: Self-pay | Admitting: Hematology and Oncology

## 2016-11-01 ENCOUNTER — Encounter: Payer: Self-pay | Admitting: *Deleted

## 2016-11-01 NOTE — Progress Notes (Signed)
[place] Work  Clinical Social Work was referred by patient for assessment of psychosocial needs due to possible financial concerns.  Clinical Social Worker contacted patient at home and left message to offer support and assess for needs.  CSW explained there may be some options for assistance. Financial counselor has also reached out to pt. CSW encouraged pt to return call.     Loren Racer, [place] Worker [place]  [place] Phone: [phone] Fax: [phone]

## 2016-11-02 ENCOUNTER — Ambulatory Visit: Payer: Medicare Other

## 2016-11-02 ENCOUNTER — Telehealth: Payer: Self-pay

## 2016-11-02 ENCOUNTER — Ambulatory Visit: Payer: Medicare Other | Admitting: Hematology and Oncology

## 2016-11-02 ENCOUNTER — Other Ambulatory Visit: Payer: Medicare Other

## 2016-11-02 NOTE — Telephone Encounter (Signed)
Pt lvm regarding feeling very dehydrated. Called pt back and spoke with her about her symptoms. Pt states that she is still nauseated and she feels that she is dehydrated and unable to drink enough fluids. Pt denies fever, chills or vomiting. Pt is still able to eat small bites at a time. She had ivf's with [facility] a week or so ago and had felt so much improved. Pt is now feeling like she used to before she had IVF's and feels that she needs to come in for fluids. Called infusion to request for pt to be scheduled tomorrow morning. Confirmed time @10 :15 for infusion. Will add lab to be drawn in infusion as well. Pt voiced understanding and will call if cannot make appt. In addition, pt may also pick up pain medication refill script for roxicodone. Told pt that Denyse Amass from pharmacy can assist pt in enrolling her for patient assistance program for ibrance medication to help with cost. Told pt that she will need to sign form in order to process papers. Pt aware of this plan and agrees to sign form.

## 2016-11-03 ENCOUNTER — Other Ambulatory Visit: Payer: Self-pay

## 2016-11-03 ENCOUNTER — Ambulatory Visit (HOSPITAL_BASED_OUTPATIENT_CLINIC_OR_DEPARTMENT_OTHER): Payer: Medicare Other

## 2016-11-03 ENCOUNTER — Other Ambulatory Visit: Payer: Medicare Other

## 2016-11-03 ENCOUNTER — Other Ambulatory Visit (HOSPITAL_BASED_OUTPATIENT_CLINIC_OR_DEPARTMENT_OTHER): Payer: Medicare Other

## 2016-11-03 ENCOUNTER — Ambulatory Visit: Payer: Medicare Other

## 2016-11-03 VITALS — BP 100/72 | HR 97 | Temp 99.8°F | Resp 18

## 2016-11-03 DIAGNOSIS — C7951 Secondary malignant neoplasm of bone: Secondary | ICD-10-CM

## 2016-11-03 DIAGNOSIS — C773 Secondary and unspecified malignant neoplasm of axilla and upper limb lymph nodes: Secondary | ICD-10-CM

## 2016-11-03 DIAGNOSIS — D110 Benign neoplasm of parotid gland: Secondary | ICD-10-CM

## 2016-11-03 DIAGNOSIS — C50512 Malignant neoplasm of lower-outer quadrant of left female breast: Secondary | ICD-10-CM

## 2016-11-03 DIAGNOSIS — Z170 Estrogen receptor positive status [ER+]: Principal | ICD-10-CM

## 2016-11-03 DIAGNOSIS — Z95828 Presence of other vascular implants and grafts: Secondary | ICD-10-CM

## 2016-11-03 LAB — CBC WITH DIFFERENTIAL/PLATELET
BASO%: 0.7 % (ref 0.0–2.0)
BASOS ABS: 0 10*3/uL (ref 0.0–0.1)
EOS%: 1.3 % (ref 0.0–7.0)
Eosinophils Absolute: 0 10*3/uL (ref 0.0–0.5)
HEMATOCRIT: 35.2 % (ref 34.8–46.6)
HEMOGLOBIN: 12.1 g/dL (ref 11.6–15.9)
LYMPH#: 0.5 10*3/uL — AB (ref 0.9–3.3)
LYMPH%: 15.3 % (ref 14.0–49.7)
MCH: 31.4 pg (ref 25.1–34.0)
MCHC: 34.4 g/dL (ref 31.5–36.0)
MCV: 91.4 fL (ref 79.5–101.0)
MONO#: 0.2 10*3/uL (ref 0.1–0.9)
MONO%: 5.3 % (ref 0.0–14.0)
NEUT#: 2.3 10*3/uL (ref 1.5–6.5)
NEUT%: 77.4 % — AB (ref 38.4–76.8)
PLATELETS: 171 10*3/uL (ref 145–400)
RBC: 3.85 10*6/uL (ref 3.70–5.45)
RDW: 14.5 % (ref 11.2–14.5)
WBC: 3 10*3/uL — ABNORMAL LOW (ref 3.9–10.3)

## 2016-11-03 LAB — COMPREHENSIVE METABOLIC PANEL
ALBUMIN: 3.3 g/dL — AB (ref 3.5–5.0)
ALK PHOS: 163 U/L — AB (ref 40–150)
ALT: 29 U/L (ref 0–55)
ANION GAP: 9 meq/L (ref 3–11)
AST: 22 U/L (ref 5–34)
BUN: 8.6 mg/dL (ref 7.0–26.0)
CALCIUM: 9.4 mg/dL (ref 8.4–10.4)
CHLORIDE: 104 meq/L (ref 98–109)
CO2: 26 mEq/L (ref 22–29)
CREATININE: 0.7 mg/dL (ref 0.6–1.1)
EGFR: >90 mL/min/{1.73_m2} (ref >90)
Glucose: 144 mg/dl — ABNORMAL HIGH (ref 70–140)
POTASSIUM: 3.3 meq/L — AB (ref 3.5–5.1)
Sodium: 139 mEq/L (ref 136–145)
Total Bilirubin: 1.18 mg/dL (ref 0.20–1.20)
Total Protein: 7.1 g/dL (ref 6.4–8.3)

## 2016-11-03 MED ORDER — SODIUM CHLORIDE 0.9% FLUSH
10.0000 mL | INTRAVENOUS | Status: DC | PRN
  Administered 2016-11-03: 10 mL via INTRAVENOUS
  Filled 2016-11-03: qty 10

## 2016-11-03 MED ORDER — HEPARIN SOD (PORK) LOCK FLUSH 100 UNIT/ML IV SOLN
500.0000 [IU] | Freq: Once | INTRAVENOUS | Status: AC
  Administered 2016-11-03: 500 [IU] via INTRAVENOUS
  Filled 2016-11-03: qty 5

## 2016-11-03 MED ORDER — SODIUM CHLORIDE 0.9 % IV SOLN
Freq: Once | INTRAVENOUS | Status: AC
  Administered 2016-11-03: 11:00:00 via INTRAVENOUS

## 2016-11-03 MED ORDER — ONDANSETRON HCL 4 MG/2ML IJ SOLN
8.0000 mg | Freq: Once | INTRAMUSCULAR | Status: AC
  Administered 2016-11-03: 8 mg via INTRAVENOUS

## 2016-11-03 MED ORDER — SODIUM CHLORIDE 0.9 % IV SOLN: 8.0000 mg | Freq: Once | INTRAVENOUS | Status: DC | PRN

## 2016-11-03 MED ORDER — ONDANSETRON HCL 4 MG/2ML IJ SOLN
INTRAMUSCULAR | Status: AC
  Filled 2016-11-03: qty 4

## 2016-11-03 NOTE — Progress Notes (Signed)
Potassium 3.3, pt educated on adding Potassium rich foods into daily diet. Pt verbalizes understanding. Pt and VS stable at discharge.

## 2016-11-03 NOTE — Patient Instructions (Addendum)
Dehydration, Adult Dehydration is a condition in which there is not enough fluid or water in the body. This happens when you lose more fluids than you take in. Important organs, such as the kidneys, brain, and heart, cannot function without a proper amount of fluids. Any loss of fluids from the body can lead to dehydration. Dehydration can range from mild to severe. This condition should be treated right away to prevent it from becoming severe. What are the causes? This condition may be caused by:  Vomiting.  Diarrhea.  Excessive sweating, such as from heat exposure or exercise.  Not drinking enough fluid, especially:  When ill.  While doing activity that requires a lot of energy.  Excessive urination.  Fever.  Infection.  Certain medicines, such as medicines that cause the body to lose excess fluid (diuretics).  Inability to access safe drinking water.  Reduced physical ability to get adequate water and food. What increases the risk? This condition is more likely to develop in people:  Who have a poorly controlled long-term (chronic) illness, such as diabetes, heart disease, or kidney disease.  Who are age 65 or older.  Who are disabled.  Who live in a place with high altitude.  Who play endurance sports. What are the signs or symptoms? Symptoms of mild dehydration may include:   Thirst.  Dry lips.  Slightly dry mouth.  Dry, warm skin.  Dizziness. Symptoms of moderate dehydration may include:   Very dry mouth.  Muscle cramps.  Dark urine. Urine may be the color of tea.  Decreased urine production.  Decreased tear production.  Heartbeat that is irregular or faster than normal (palpitations).  Headache.  Light-headedness, especially when you stand up from a sitting position.  Fainting (syncope). Symptoms of severe dehydration may include:   Changes in skin, such as:  Cold and clammy skin.  Blotchy (mottled) or pale skin.  Skin that does  not quickly return to normal after being lightly pinched and released (poor skin turgor).  Changes in body fluids, such as:  Extreme thirst.  No tear production.  Inability to sweat when body temperature is high, such as in hot weather.  Very little urine production.  Changes in vital signs, such as:  Weak pulse.  Pulse that is more than 100 beats a minute when sitting still.  Rapid breathing.  Low blood pressure.  Other changes, such as:  Sunken eyes.  Cold hands and feet.  Confusion.  Lack of energy (lethargy).  Difficulty waking up from sleep.  Short-term weight loss.  Unconsciousness. How is this diagnosed? This condition is diagnosed based on your symptoms and a physical exam. Blood and urine tests may be done to help confirm the diagnosis. How is this treated? Treatment for this condition depends on the severity. Mild or moderate dehydration can often be treated at home. Treatment should be started right away. Do not wait until dehydration becomes severe. Severe dehydration is an emergency and it needs to be treated in a hospital. Treatment for mild dehydration may include:   Drinking more fluids.  Replacing salts and minerals in your blood (electrolytes) that you may have lost. Treatment for moderate dehydration may include:   Drinking an oral rehydration solution (ORS). This is a drink that helps you replace fluids and electrolytes (rehydrate). It can be found at pharmacies and retail stores. Treatment for severe dehydration may include:   Receiving fluids through an IV tube.  Receiving an electrolyte solution through a feeding tube that is   passed through your nose and into your stomach (nasogastric tube, or NG tube).  Correcting any abnormalities in electrolytes.  Treating the underlying cause of dehydration. Follow these instructions at home:  If directed by your health care provider, drink an ORS:  Make an ORS by following instructions on the  package.  Start by drinking small amounts, about  cup (120 mL) every 5-10 minutes.  Slowly increase how much you drink until you have taken the amount recommended by your health care provider.  Drink enough clear fluid to keep your urine clear or pale yellow. If you were told to drink an ORS, finish the ORS first, then start slowly drinking other clear fluids. Drink fluids such as:  Water. Do not drink only water. Doing that can lead to having too little salt (sodium) in the body (hyponatremia).  Ice chips.  Fruit juice that you have added water to (diluted fruit juice).  Low-calorie sports drinks.  Avoid:  Alcohol.  Drinks that contain a lot of sugar. These include high-calorie sports drinks, fruit juice that is not diluted, and soda.  Caffeine.  Foods that are greasy or contain a lot of fat or sugar.  Take over-the-counter and prescription medicines only as told by your health care provider.  Do not take sodium tablets. This can lead to having too much sodium in the body (hypernatremia).  Eat foods that contain a healthy balance of electrolytes, such as bananas, oranges, potatoes, tomatoes, and spinach.  Keep all follow-up visits as told by your health care provider. This is important. Contact a health care provider if:  You have abdominal pain that:  Gets worse.  Stays in one area (localizes).  You have a rash.  You have a stiff neck.  You are more irritable than usual.  You are sleepier or more difficult to wake up than usual.  You feel weak or dizzy.  You feel very thirsty.  You have urinated only a small amount of very dark urine over 6-8 hours. Get help right away if:  You have symptoms of severe dehydration.  You cannot drink fluids without vomiting.  Your symptoms get worse with treatment.  You have a fever.  You have a severe headache.  You have vomiting or diarrhea that:  Gets worse.  Does not go away.  You have blood or green matter  (bile) in your vomit.  You have blood in your stool. This may cause stool to look black and tarry.  You have not urinated in 6-8 hours.  You faint.  Your heart rate while sitting still is over 100 beats a minute.  You have trouble breathing. This information is not intended to replace advice given to you by your health care provider. Make sure you discuss any questions you have with your health care provider. Document Released: 10/30/2005 Document Revised: 05/26/2016 Document Reviewed: 12/24/2015 Elsevier Interactive Patient Education  2017 Elsevier Inc.  

## 2016-11-09 ENCOUNTER — Telehealth: Payer: Self-pay | Admitting: *Deleted

## 2016-11-09 NOTE — Telephone Encounter (Signed)
"  This is Agricultural engineer with [facility] [phone]).  This patient has not been seen since November 16 th by our Nurse Practitioner.  We continue to call and leave messages but no return call.   Need orders from Dr. Lindi Adie to continue care or sign off. "

## 2016-11-15 ENCOUNTER — Telehealth: Payer: Self-pay | Admitting: Pharmacist

## 2016-11-15 ENCOUNTER — Telehealth: Payer: Self-pay | Admitting: Emergency Medicine

## 2016-11-15 NOTE — Telephone Encounter (Signed)
Spoke with patient; she received Ibrance shipment today. Informed her that the information is being sent to reapply for the 2018 year for Ibrance. Patient advised to call this office if she runs out of medication prior to receiving next months shipment. Patient verbalized understanding.

## 2016-11-22 ENCOUNTER — Telehealth: Payer: Self-pay | Admitting: Emergency Medicine

## 2016-11-22 ENCOUNTER — Ambulatory Visit (HOSPITAL_BASED_OUTPATIENT_CLINIC_OR_DEPARTMENT_OTHER): Payer: Medicare Other

## 2016-11-22 ENCOUNTER — Other Ambulatory Visit: Payer: Self-pay | Admitting: Emergency Medicine

## 2016-11-22 VITALS — BP 125/75 | HR 117 | Temp 99.1°F | Resp 18

## 2016-11-22 DIAGNOSIS — C773 Secondary and unspecified malignant neoplasm of axilla and upper limb lymph nodes: Secondary | ICD-10-CM

## 2016-11-22 DIAGNOSIS — C7951 Secondary malignant neoplasm of bone: Secondary | ICD-10-CM | POA: Diagnosis not present

## 2016-11-22 DIAGNOSIS — C50512 Malignant neoplasm of lower-outer quadrant of left female breast: Secondary | ICD-10-CM

## 2016-11-22 DIAGNOSIS — Z170 Estrogen receptor positive status [ER+]: Principal | ICD-10-CM

## 2016-11-22 DIAGNOSIS — R630 Anorexia: Secondary | ICD-10-CM

## 2016-11-22 MED ORDER — HEPARIN SOD (PORK) LOCK FLUSH 100 UNIT/ML IV SOLN
500.0000 [IU] | Freq: Once | INTRAVENOUS | Status: AC | PRN
  Administered 2016-11-22: 500 [IU] via INTRAVENOUS
  Filled 2016-11-22: qty 5

## 2016-11-22 MED ORDER — SODIUM CHLORIDE 0.9 % IV SOLN
INTRAVENOUS | Status: DC
  Administered 2016-11-22: 16:00:00 via INTRAVENOUS

## 2016-11-22 MED ORDER — SODIUM CHLORIDE 0.9 % IV SOLN: Freq: Once | INTRAVENOUS | Status: DC

## 2016-11-22 MED ORDER — ONDANSETRON HCL 4 MG/2ML IJ SOLN
INTRAMUSCULAR | Status: AC
  Filled 2016-11-22: qty 4

## 2016-11-22 MED ORDER — ONDANSETRON HCL 4 MG/2ML IJ SOLN
8.0000 mg | Freq: Once | INTRAMUSCULAR | Status: DC
  Administered 2016-11-22: 8 mg via INTRAVENOUS

## 2016-11-22 MED ORDER — SODIUM CHLORIDE 0.9 % IJ SOLN
10.0000 mL | INTRAMUSCULAR | Status: DC | PRN
  Administered 2016-11-22: 10 mL via INTRAVENOUS
  Filled 2016-11-22: qty 10

## 2016-11-22 NOTE — Patient Instructions (Signed)
Dehydration, Adult Dehydration is a condition in which there is not enough fluid or water in the body. This happens when you lose more fluids than you take in. Important organs, such as the kidneys, brain, and heart, cannot function without a proper amount of fluids. Any loss of fluids from the body can lead to dehydration. Dehydration can range from mild to severe. This condition should be treated right away to prevent it from becoming severe. What are the causes? This condition may be caused by:  Vomiting.  Diarrhea.  Excessive sweating, such as from heat exposure or exercise.  Not drinking enough fluid, especially:  When ill.  While doing activity that requires a lot of energy.  Excessive urination.  Fever.  Infection.  Certain medicines, such as medicines that cause the body to lose excess fluid (diuretics).  Inability to access safe drinking water.  Reduced physical ability to get adequate water and food. What increases the risk? This condition is more likely to develop in people:  Who have a poorly controlled long-term (chronic) illness, such as diabetes, heart disease, or kidney disease.  Who are age 65 or older.  Who are disabled.  Who live in a place with high altitude.  Who play endurance sports. What are the signs or symptoms? Symptoms of mild dehydration may include:   Thirst.  Dry lips.  Slightly dry mouth.  Dry, warm skin.  Dizziness. Symptoms of moderate dehydration may include:   Very dry mouth.  Muscle cramps.  Dark urine. Urine may be the color of tea.  Decreased urine production.  Decreased tear production.  Heartbeat that is irregular or faster than normal (palpitations).  Headache.  Light-headedness, especially when you stand up from a sitting position.  Fainting (syncope). Symptoms of severe dehydration may include:   Changes in skin, such as:  Cold and clammy skin.  Blotchy (mottled) or pale skin.  Skin that does  not quickly return to normal after being lightly pinched and released (poor skin turgor).  Changes in body fluids, such as:  Extreme thirst.  No tear production.  Inability to sweat when body temperature is high, such as in hot weather.  Very little urine production.  Changes in vital signs, such as:  Weak pulse.  Pulse that is more than 100 beats a minute when sitting still.  Rapid breathing.  Low blood pressure.  Other changes, such as:  Sunken eyes.  Cold hands and feet.  Confusion.  Lack of energy (lethargy).  Difficulty waking up from sleep.  Short-term weight loss.  Unconsciousness. How is this diagnosed? This condition is diagnosed based on your symptoms and a physical exam. Blood and urine tests may be done to help confirm the diagnosis. How is this treated? Treatment for this condition depends on the severity. Mild or moderate dehydration can often be treated at home. Treatment should be started right away. Do not wait until dehydration becomes severe. Severe dehydration is an emergency and it needs to be treated in a hospital. Treatment for mild dehydration may include:   Drinking more fluids.  Replacing salts and minerals in your blood (electrolytes) that you may have lost. Treatment for moderate dehydration may include:   Drinking an oral rehydration solution (ORS). This is a drink that helps you replace fluids and electrolytes (rehydrate). It can be found at pharmacies and retail stores. Treatment for severe dehydration may include:   Receiving fluids through an IV tube.  Receiving an electrolyte solution through a feeding tube that is   passed through your nose and into your stomach (nasogastric tube, or NG tube).  Correcting any abnormalities in electrolytes.  Treating the underlying cause of dehydration. Follow these instructions at home:  If directed by your health care provider, drink an ORS:  Make an ORS by following instructions on the  package.  Start by drinking small amounts, about  cup (120 mL) every 5-10 minutes.  Slowly increase how much you drink until you have taken the amount recommended by your health care provider.  Drink enough clear fluid to keep your urine clear or pale yellow. If you were told to drink an ORS, finish the ORS first, then start slowly drinking other clear fluids. Drink fluids such as:  Water. Do not drink only water. Doing that can lead to having too little salt (sodium) in the body (hyponatremia).  Ice chips.  Fruit juice that you have added water to (diluted fruit juice).  Low-calorie sports drinks.  Avoid:  Alcohol.  Drinks that contain a lot of sugar. These include high-calorie sports drinks, fruit juice that is not diluted, and soda.  Caffeine.  Foods that are greasy or contain a lot of fat or sugar.  Take over-the-counter and prescription medicines only as told by your health care provider.  Do not take sodium tablets. This can lead to having too much sodium in the body (hypernatremia).  Eat foods that contain a healthy balance of electrolytes, such as bananas, oranges, potatoes, tomatoes, and spinach.  Keep all follow-up visits as told by your health care provider. This is important. Contact a health care provider if:  You have abdominal pain that:  Gets worse.  Stays in one area (localizes).  You have a rash.  You have a stiff neck.  You are more irritable than usual.  You are sleepier or more difficult to wake up than usual.  You feel weak or dizzy.  You feel very thirsty.  You have urinated only a small amount of very dark urine over 6-8 hours. Get help right away if:  You have symptoms of severe dehydration.  You cannot drink fluids without vomiting.  Your symptoms get worse with treatment.  You have a fever.  You have a severe headache.  You have vomiting or diarrhea that:  Gets worse.  Does not go away.  You have blood or green matter  (bile) in your vomit.  You have blood in your stool. This may cause stool to look black and tarry.  You have not urinated in 6-8 hours.  You faint.  Your heart rate while sitting still is over 100 beats a minute.  You have trouble breathing. This information is not intended to replace advice given to you by your health care provider. Make sure you discuss any questions you have with your health care provider. Document Released: 10/30/2005 Document Revised: 05/26/2016 Document Reviewed: 12/24/2015 Elsevier Interactive Patient Education  2017 Elsevier Inc.  

## 2016-11-22 NOTE — Telephone Encounter (Signed)
Patient called complaining of decreased appetite and low oral intake. Patient requesting IV fluids today. Patient denies diarrhea; states some nausea. Patient scheduled IV fluids today per Dr Geralyn Flash orders. Patient aware of appointment.

## 2016-11-22 NOTE — Addendum Note (Signed)
Addended by: Henreitta Leber E on: 11/22/2016 02:38 PM   Modules accepted: Orders

## 2016-11-24 DIAGNOSIS — C50919 Malignant neoplasm of unspecified site of unspecified female breast: Secondary | ICD-10-CM | POA: Diagnosis not present

## 2016-11-24 DIAGNOSIS — R112 Nausea with vomiting, unspecified: Secondary | ICD-10-CM | POA: Diagnosis not present

## 2016-11-28 NOTE — Telephone Encounter (Signed)
Oral Chemotherapy Pharmacist Encounter  We had been assisting patient with re-enrollment application for [employer] patient assistance program to obtain [employer] free-of-charge from the manufacturer. I called patient today to follow up when she would be bringing proof of income documentation to the office. She will also need to sign application before it is faxed to [employer].  Patient informed me that she is no longer under the care of Dr. Lindi Adie and would transferring care to another Oncologist (not in our office) this coming Friday. I informed patient that our office will not be able to assist with the current application as signed by Dr. Lindi Adie if she was no longer under his care.  I instructed patient to inform new oncologist's office that she needs support obtaining her medication.  Patient expressed understanding.   [facility] will discard application we had started and sign off. Please let us know if we can be of help in the future.  Johny Drilling, PharmD, BCPS, BCOP 11/28/2016  4:08 PM [facility] [phone]

## 2016-11-28 NOTE — Progress Notes (Signed)
Called pt to follow up with pt regarding changing oncologist. Pt states that she is now seeing Dr.Khan and needs a 2nd opinion at this time. Pt appreciative of Dr.Gudena's care and staff. Told pt that pharmacy cant apply for asssitance with her Ibrance under Dr.Gudena if she is no longer under his care. Pt will need to speak with Dr.Khan about getting her patient assistance with [employer] [facility], if she needs to continue with this treatment. Pt understands and will mention at her appt this week.Told pt that she may call us any time for any questions or concerns.

## 2016-12-01 ENCOUNTER — Telehealth: Payer: Self-pay | Admitting: *Deleted

## 2016-12-01 ENCOUNTER — Ambulatory Visit: Payer: Medicare Other

## 2016-12-01 ENCOUNTER — Other Ambulatory Visit: Payer: Medicare Other

## 2016-12-01 DIAGNOSIS — R638 Other symptoms and signs concerning food and fluid intake: Secondary | ICD-10-CM | POA: Diagnosis not present

## 2016-12-01 DIAGNOSIS — R112 Nausea with vomiting, unspecified: Secondary | ICD-10-CM | POA: Diagnosis not present

## 2016-12-01 NOTE — Telephone Encounter (Signed)
"  Ronalee Belts with [employer] [phone]) calling about mutual patient we're unable to reach and no return calls.  We need an application and proof of income faxed to Fax: [phone] with a cover sheet.  Please call if any questoins."

## 2016-12-08 DIAGNOSIS — C787 Secondary malignant neoplasm of liver and intrahepatic bile duct: Secondary | ICD-10-CM | POA: Diagnosis not present

## 2016-12-08 DIAGNOSIS — Z9013 Acquired absence of bilateral breasts and nipples: Secondary | ICD-10-CM | POA: Diagnosis not present

## 2016-12-08 DIAGNOSIS — C229 Malignant neoplasm of liver, not specified as primary or secondary: Secondary | ICD-10-CM | POA: Diagnosis not present

## 2016-12-08 DIAGNOSIS — J90 Pleural effusion, not elsewhere classified: Secondary | ICD-10-CM | POA: Diagnosis not present

## 2016-12-08 DIAGNOSIS — R109 Unspecified abdominal pain: Secondary | ICD-10-CM | POA: Diagnosis not present

## 2016-12-08 DIAGNOSIS — C7951 Secondary malignant neoplasm of bone: Secondary | ICD-10-CM | POA: Diagnosis not present

## 2016-12-08 DIAGNOSIS — C50919 Malignant neoplasm of unspecified site of unspecified female breast: Secondary | ICD-10-CM | POA: Diagnosis not present

## 2016-12-08 DIAGNOSIS — R112 Nausea with vomiting, unspecified: Secondary | ICD-10-CM | POA: Diagnosis not present

## 2016-12-08 DIAGNOSIS — D61811 Other drug-induced pancytopenia: Secondary | ICD-10-CM | POA: Diagnosis not present

## 2016-12-08 DIAGNOSIS — Z853 Personal history of malignant neoplasm of breast: Secondary | ICD-10-CM | POA: Diagnosis not present

## 2016-12-08 DIAGNOSIS — R740 Nonspecific elevation of levels of transaminase and lactic acid dehydrogenase [LDH]: Secondary | ICD-10-CM | POA: Diagnosis not present

## 2016-12-08 DIAGNOSIS — Z79811 Long term (current) use of aromatase inhibitors: Secondary | ICD-10-CM | POA: Diagnosis not present

## 2016-12-08 DIAGNOSIS — T48995A Adverse effect of other agents primarily acting on the respiratory system, initial encounter: Secondary | ICD-10-CM | POA: Diagnosis present

## 2016-12-08 DIAGNOSIS — G893 Neoplasm related pain (acute) (chronic): Secondary | ICD-10-CM | POA: Diagnosis not present

## 2016-12-08 DIAGNOSIS — E860 Dehydration: Secondary | ICD-10-CM | POA: Diagnosis not present

## 2016-12-08 DIAGNOSIS — R1084 Generalized abdominal pain: Secondary | ICD-10-CM | POA: Diagnosis not present

## 2016-12-12 DIAGNOSIS — K56609 Unspecified intestinal obstruction, unspecified as to partial versus complete obstruction: Secondary | ICD-10-CM | POA: Diagnosis not present

## 2016-12-12 DIAGNOSIS — R110 Nausea: Secondary | ICD-10-CM | POA: Diagnosis not present

## 2016-12-12 DIAGNOSIS — K311 Adult hypertrophic pyloric stenosis: Secondary | ICD-10-CM | POA: Diagnosis not present

## 2016-12-12 DIAGNOSIS — K5903 Drug induced constipation: Secondary | ICD-10-CM | POA: Diagnosis not present

## 2016-12-13 DIAGNOSIS — C50912 Malignant neoplasm of unspecified site of left female breast: Secondary | ICD-10-CM | POA: Diagnosis not present

## 2016-12-13 DIAGNOSIS — Z881 Allergy status to other antibiotic agents status: Secondary | ICD-10-CM | POA: Diagnosis not present

## 2016-12-13 DIAGNOSIS — R531 Weakness: Secondary | ICD-10-CM | POA: Diagnosis not present

## 2016-12-13 DIAGNOSIS — E860 Dehydration: Secondary | ICD-10-CM | POA: Diagnosis not present

## 2016-12-13 DIAGNOSIS — Z170 Estrogen receptor positive status [ER+]: Secondary | ICD-10-CM | POA: Diagnosis not present

## 2016-12-13 DIAGNOSIS — R112 Nausea with vomiting, unspecified: Secondary | ICD-10-CM | POA: Diagnosis not present

## 2016-12-13 DIAGNOSIS — C787 Secondary malignant neoplasm of liver and intrahepatic bile duct: Secondary | ICD-10-CM | POA: Diagnosis not present

## 2016-12-13 DIAGNOSIS — Z79899 Other long term (current) drug therapy: Secondary | ICD-10-CM | POA: Diagnosis not present

## 2016-12-13 DIAGNOSIS — R410 Disorientation, unspecified: Secondary | ICD-10-CM | POA: Diagnosis not present

## 2016-12-13 DIAGNOSIS — R140 Abdominal distension (gaseous): Secondary | ICD-10-CM | POA: Diagnosis not present

## 2016-12-13 DIAGNOSIS — C7951 Secondary malignant neoplasm of bone: Secondary | ICD-10-CM | POA: Diagnosis not present

## 2016-12-13 DIAGNOSIS — F99 Mental disorder, not otherwise specified: Secondary | ICD-10-CM | POA: Diagnosis not present

## 2016-12-13 DIAGNOSIS — K831 Obstruction of bile duct: Secondary | ICD-10-CM | POA: Diagnosis not present

## 2016-12-13 DIAGNOSIS — Z7951 Long term (current) use of inhaled steroids: Secondary | ICD-10-CM | POA: Diagnosis not present

## 2016-12-13 DIAGNOSIS — Z79811 Long term (current) use of aromatase inhibitors: Secondary | ICD-10-CM | POA: Diagnosis not present

## 2016-12-13 DIAGNOSIS — F419 Anxiety disorder, unspecified: Secondary | ICD-10-CM | POA: Diagnosis not present

## 2016-12-13 DIAGNOSIS — F329 Major depressive disorder, single episode, unspecified: Secondary | ICD-10-CM | POA: Diagnosis not present

## 2016-12-13 DIAGNOSIS — C7989 Secondary malignant neoplasm of other specified sites: Secondary | ICD-10-CM | POA: Diagnosis not present

## 2016-12-13 DIAGNOSIS — R630 Anorexia: Secondary | ICD-10-CM | POA: Diagnosis not present

## 2016-12-13 DIAGNOSIS — C50512 Malignant neoplasm of lower-outer quadrant of left female breast: Secondary | ICD-10-CM | POA: Diagnosis not present

## 2016-12-13 DIAGNOSIS — C771 Secondary and unspecified malignant neoplasm of intrathoracic lymph nodes: Secondary | ICD-10-CM | POA: Diagnosis not present

## 2016-12-13 DIAGNOSIS — C773 Secondary and unspecified malignant neoplasm of axilla and upper limb lymph nodes: Secondary | ICD-10-CM | POA: Diagnosis not present

## 2016-12-13 DIAGNOSIS — R918 Other nonspecific abnormal finding of lung field: Secondary | ICD-10-CM | POA: Diagnosis not present

## 2016-12-13 DIAGNOSIS — Z9221 Personal history of antineoplastic chemotherapy: Secondary | ICD-10-CM | POA: Diagnosis not present

## 2016-12-13 DIAGNOSIS — Z923 Personal history of irradiation: Secondary | ICD-10-CM | POA: Diagnosis not present

## 2016-12-15 DIAGNOSIS — R112 Nausea with vomiting, unspecified: Secondary | ICD-10-CM | POA: Diagnosis not present

## 2016-12-15 DIAGNOSIS — R638 Other symptoms and signs concerning food and fluid intake: Secondary | ICD-10-CM | POA: Diagnosis not present

## 2016-12-21 DIAGNOSIS — Z452 Encounter for adjustment and management of vascular access device: Secondary | ICD-10-CM | POA: Diagnosis not present

## 2016-12-21 DIAGNOSIS — K861 Other chronic pancreatitis: Secondary | ICD-10-CM | POA: Diagnosis not present

## 2016-12-21 DIAGNOSIS — C778 Secondary and unspecified malignant neoplasm of lymph nodes of multiple regions: Secondary | ICD-10-CM | POA: Diagnosis not present

## 2016-12-21 DIAGNOSIS — C7951 Secondary malignant neoplasm of bone: Secondary | ICD-10-CM | POA: Diagnosis not present

## 2016-12-21 DIAGNOSIS — Z853 Personal history of malignant neoplasm of breast: Secondary | ICD-10-CM | POA: Diagnosis not present

## 2016-12-21 DIAGNOSIS — C787 Secondary malignant neoplasm of liver and intrahepatic bile duct: Secondary | ICD-10-CM | POA: Diagnosis not present

## 2016-12-21 DIAGNOSIS — Z9013 Acquired absence of bilateral breasts and nipples: Secondary | ICD-10-CM | POA: Diagnosis not present

## 2016-12-22 DIAGNOSIS — C7951 Secondary malignant neoplasm of bone: Secondary | ICD-10-CM | POA: Diagnosis not present

## 2016-12-22 DIAGNOSIS — Z853 Personal history of malignant neoplasm of breast: Secondary | ICD-10-CM | POA: Diagnosis not present

## 2016-12-22 DIAGNOSIS — C778 Secondary and unspecified malignant neoplasm of lymph nodes of multiple regions: Secondary | ICD-10-CM | POA: Diagnosis not present

## 2016-12-22 DIAGNOSIS — Z452 Encounter for adjustment and management of vascular access device: Secondary | ICD-10-CM | POA: Diagnosis not present

## 2016-12-22 DIAGNOSIS — Z9013 Acquired absence of bilateral breasts and nipples: Secondary | ICD-10-CM | POA: Diagnosis not present

## 2016-12-22 DIAGNOSIS — C787 Secondary malignant neoplasm of liver and intrahepatic bile duct: Secondary | ICD-10-CM | POA: Diagnosis not present

## 2016-12-23 DIAGNOSIS — C778 Secondary and unspecified malignant neoplasm of lymph nodes of multiple regions: Secondary | ICD-10-CM | POA: Diagnosis not present

## 2016-12-23 DIAGNOSIS — Z452 Encounter for adjustment and management of vascular access device: Secondary | ICD-10-CM | POA: Diagnosis not present

## 2016-12-23 DIAGNOSIS — C7951 Secondary malignant neoplasm of bone: Secondary | ICD-10-CM | POA: Diagnosis not present

## 2016-12-23 DIAGNOSIS — Z853 Personal history of malignant neoplasm of breast: Secondary | ICD-10-CM | POA: Diagnosis not present

## 2016-12-23 DIAGNOSIS — C787 Secondary malignant neoplasm of liver and intrahepatic bile duct: Secondary | ICD-10-CM | POA: Diagnosis not present

## 2016-12-23 DIAGNOSIS — Z9013 Acquired absence of bilateral breasts and nipples: Secondary | ICD-10-CM | POA: Diagnosis not present

## 2016-12-27 NOTE — Telephone Encounter (Signed)
lvm advising patient to schedule AWV °

## 2017-01-01 ENCOUNTER — Ambulatory Visit: Payer: Medicare Other | Admitting: Hematology and Oncology

## 2017-01-01 ENCOUNTER — Other Ambulatory Visit: Payer: Medicare Other

## 2017-01-01 ENCOUNTER — Ambulatory Visit: Payer: Medicare Other

## 2017-01-11 DEATH — deceased

## 2017-10-19 IMAGING — DX DG ABDOMEN 2V
3 series · 3 of 3 positions shown · non-contrast
Comparison: ERCP 09/13/2016

CLINICAL DATA: Displacement of biliary stent.

EXAM:
ABDOMEN - 2 VIEW

[abdomen erect]
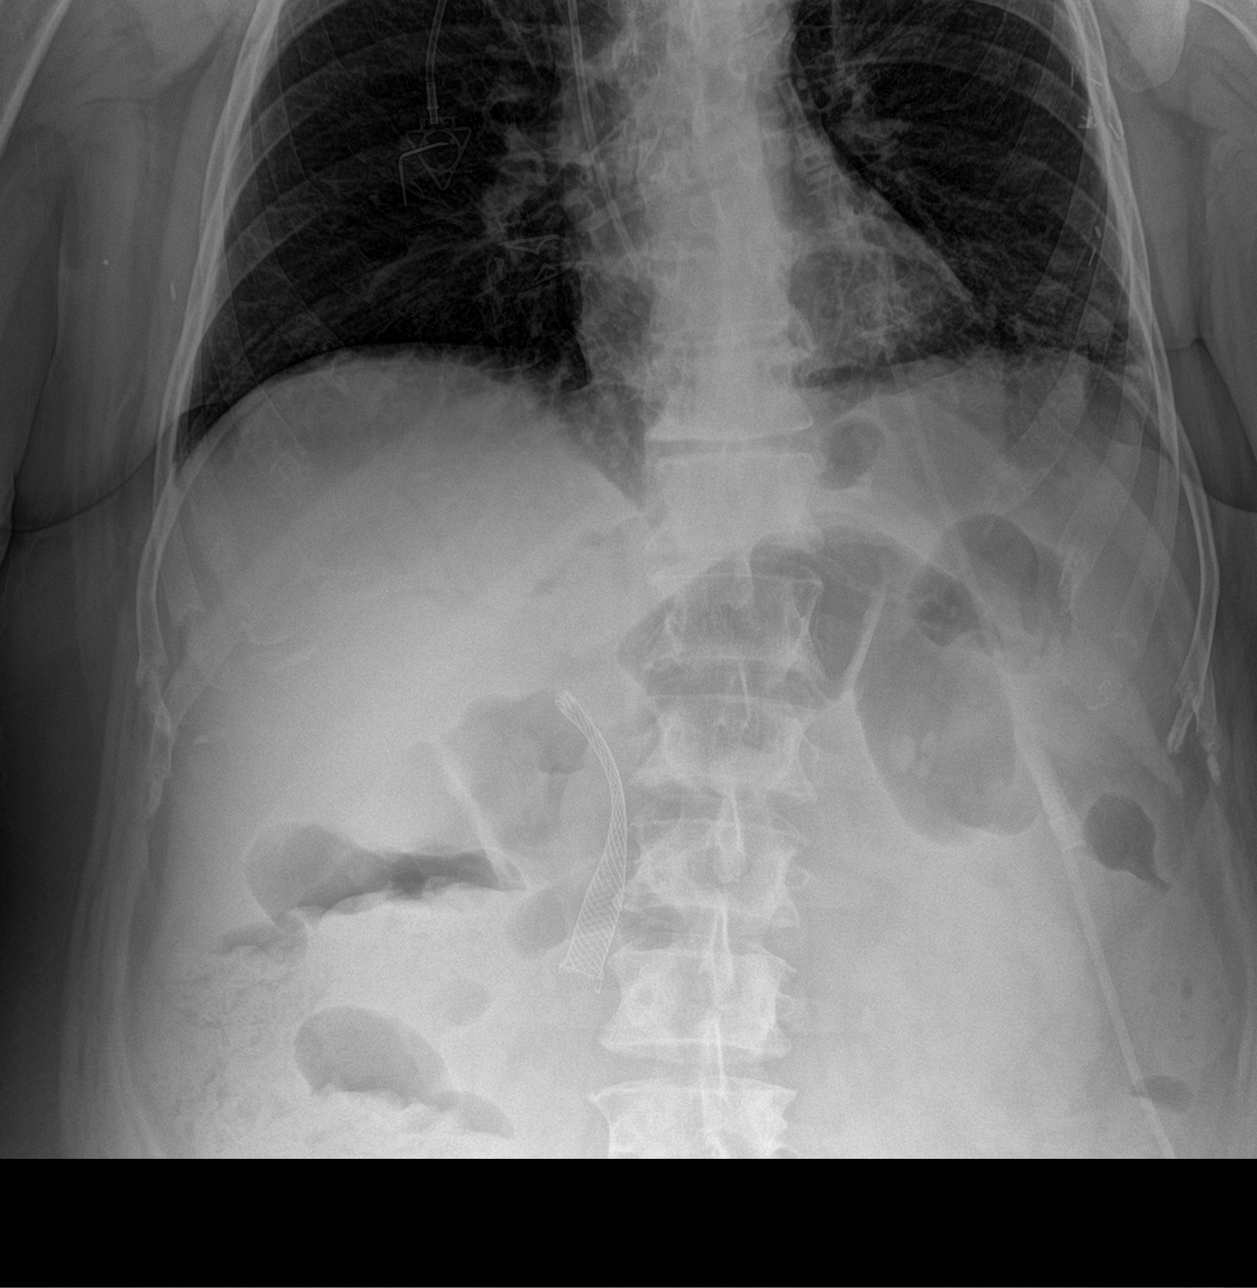

[abdomen supine (1 of 2)]
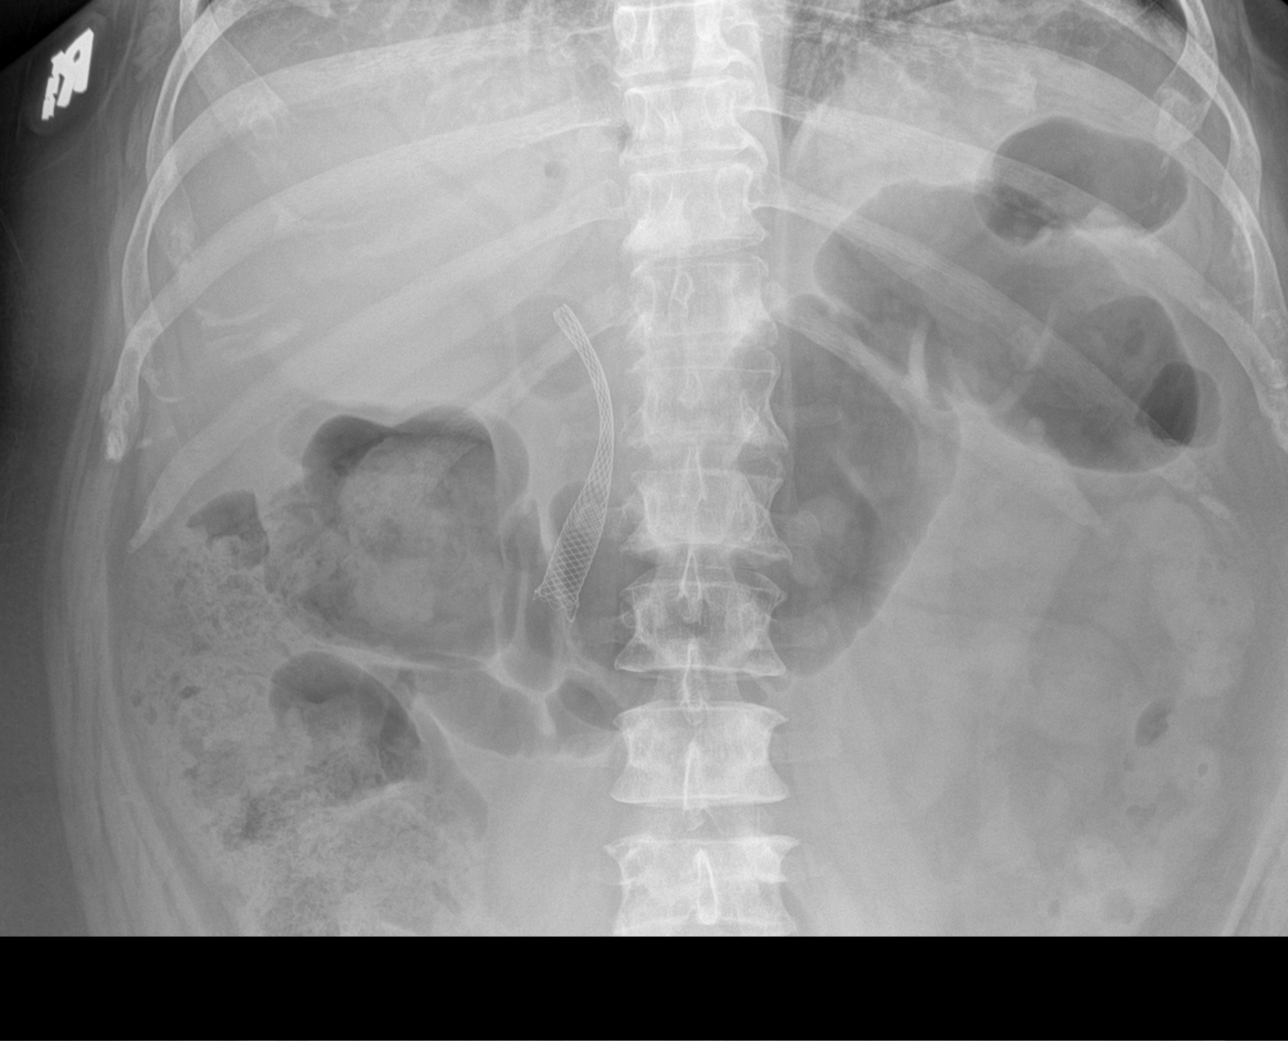

[abdomen supine (2 of 2)]
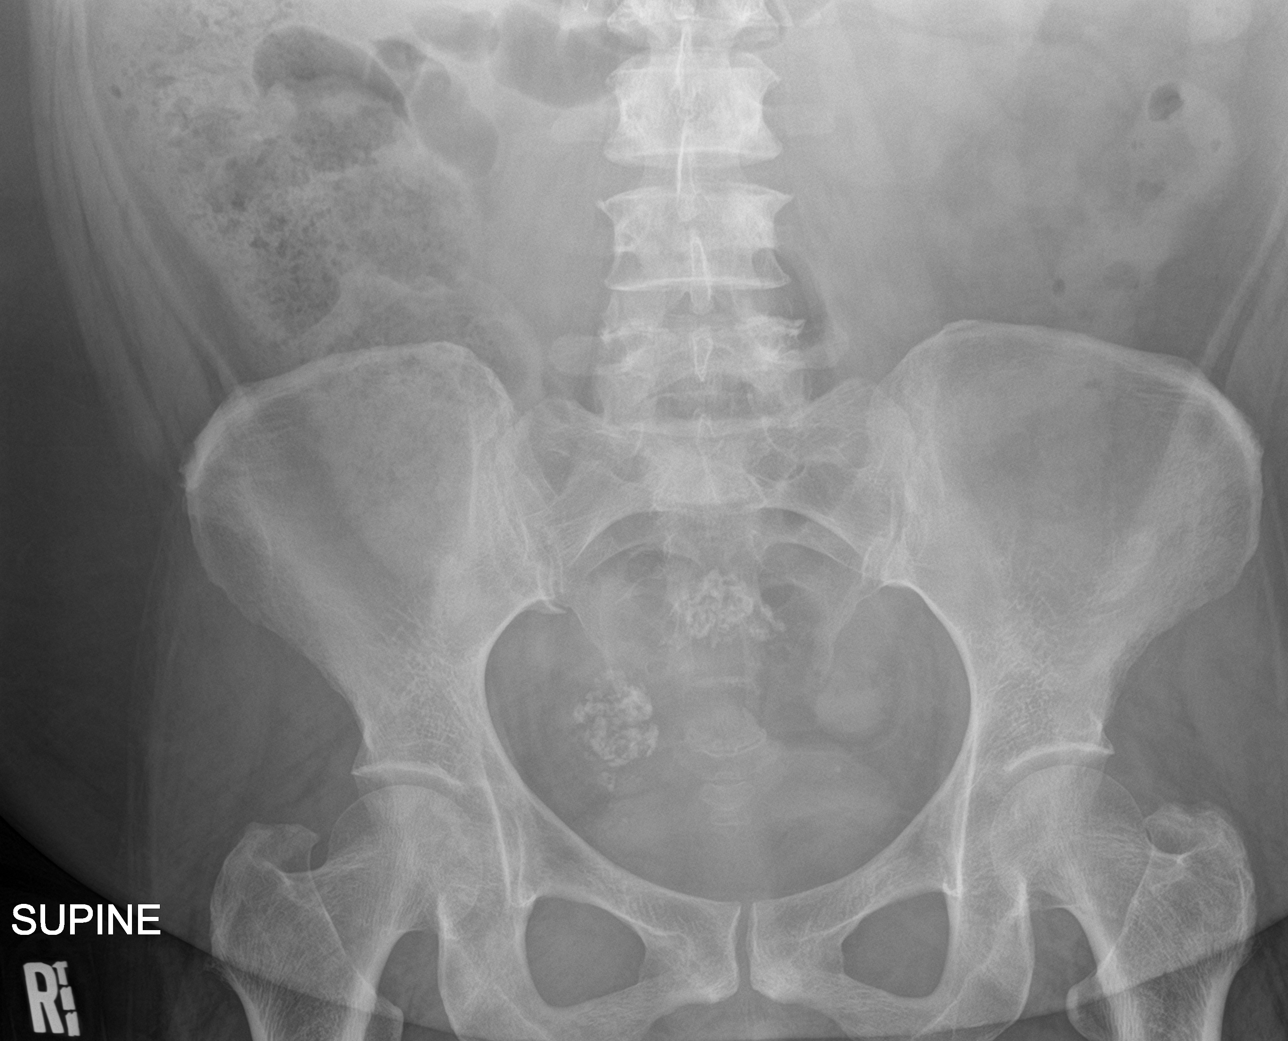

[3 of 3 positions shown; findings below may reference images not displayed]

FINDINGS: Metallic biliary stent is seen in the right upper quadrant which
appears to be in stable position when compared to prior he ERCP.
There is pneumobilia noted within the liver. Nonobstructive bowel
gas pattern with moderate gas in stool within the colon. Calcified
fibroids in the pelvis. No free air.
IMPRESSION: Metallic biliary stent appears to be in stable position when
compared to prior ERCP.

Pneumobilia.

Moderate stool in the colon.

## 2017-10-19 IMAGING — RF DG SWALLOWING FUNCTION - NRPT [facility]
6 series · 20 of 23 positions shown · non-contrast
Comparison: none

[Series 1: cp_standard · 0.34mm/px · 3 of 31 frames shown (1 of 6)]
[frame 5/31]
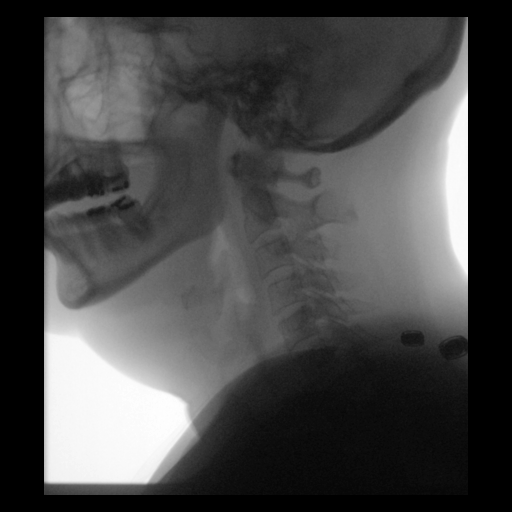
[frame 13/31]
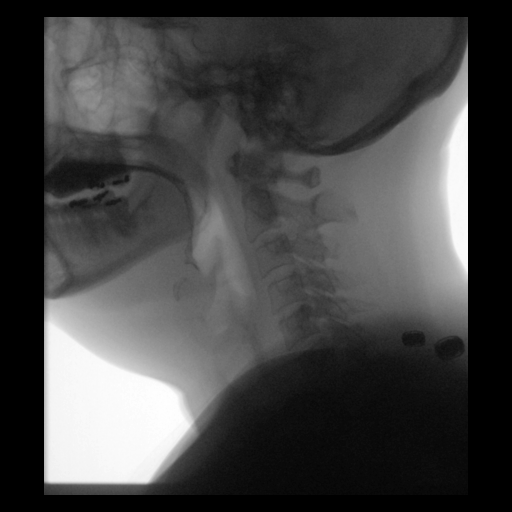
[frame 16/31]
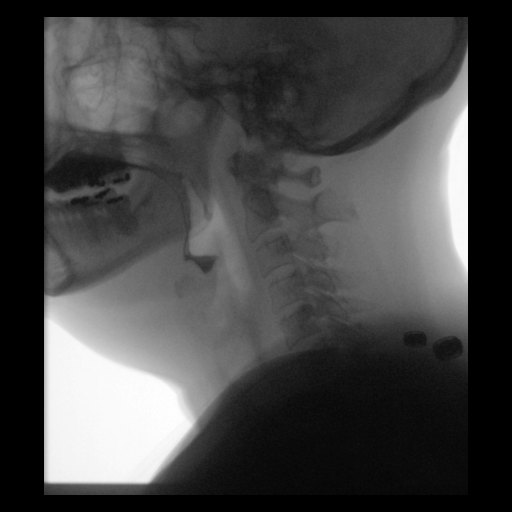

[Series 2: cp_standard · 0.34mm/px · 3 of 32 frames shown (2 of 6)]
[frame 5/32]
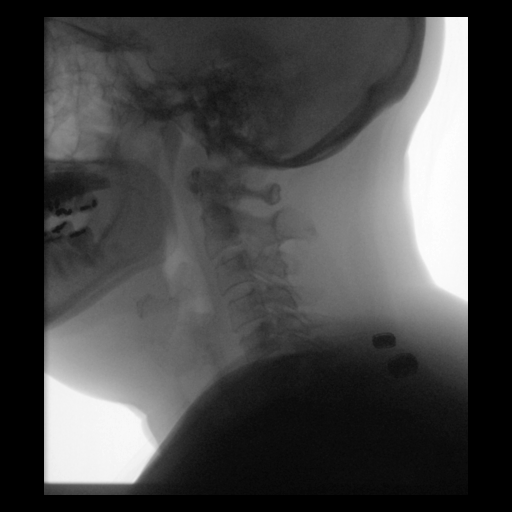
[frame 17/32]
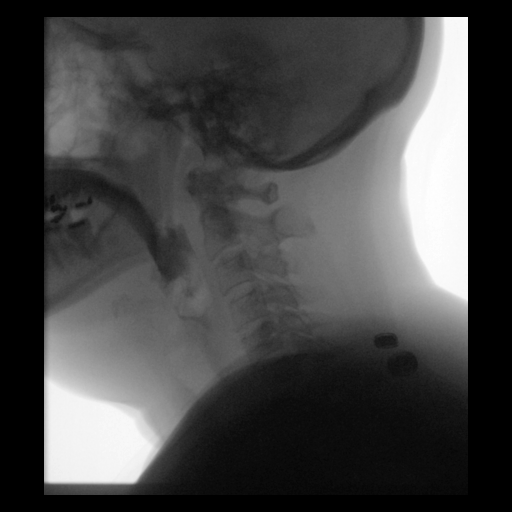
[frame 28/32]
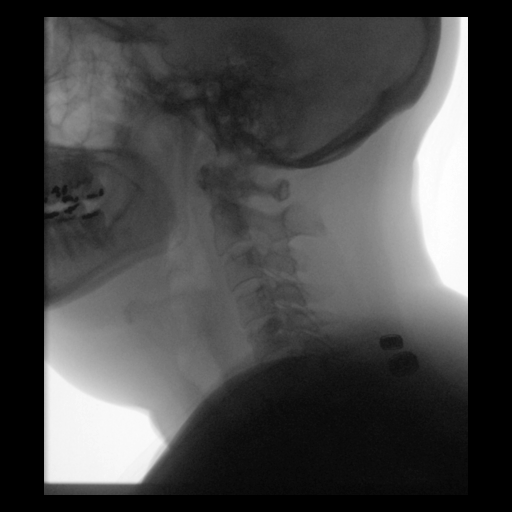

[Series 3: cp_standard · 0.34mm/px · 4 of 33 frames shown (3 of 6)]
[frame 5/33]
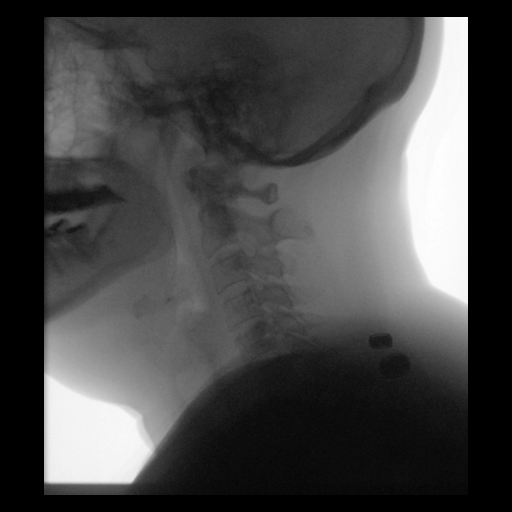
[frame 17/33]
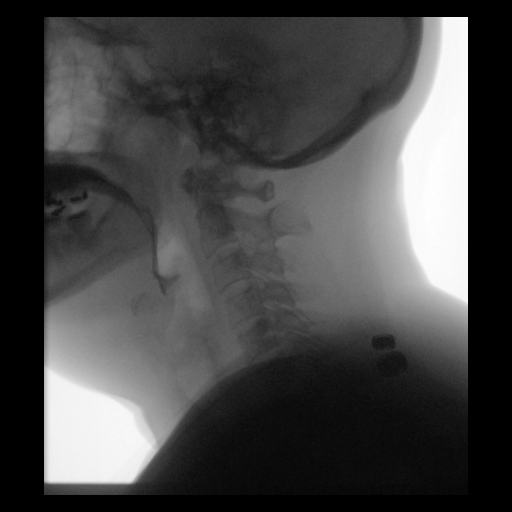
[frame 29/33]
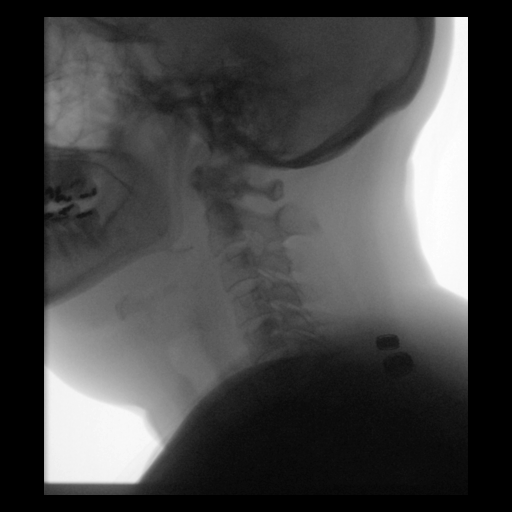
[frame 30/33]
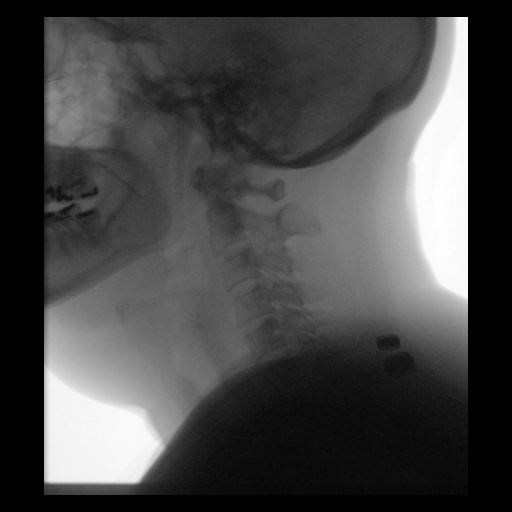

[Series 4: cp_standard · 0.34mm/px · 3 of 49 frames shown (4 of 6)]
[frame 20/49]
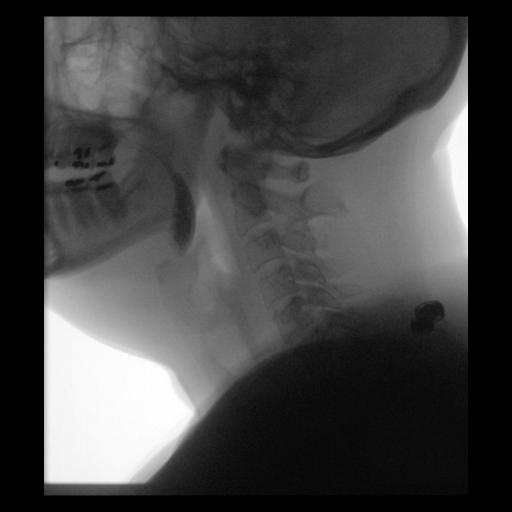
[frame 25/49]
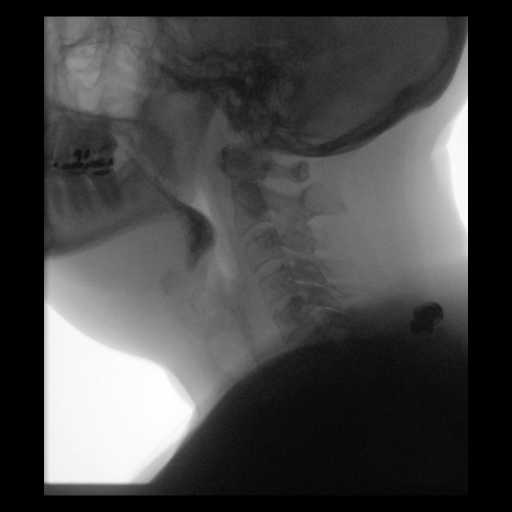
[frame 42/49]
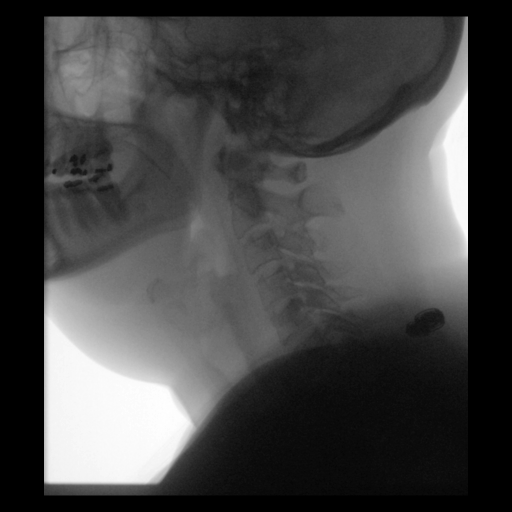

[Series 5: cp_standard · 0.34mm/px · 4 of 52 frames shown (5 of 6)]
[frame 1/52]
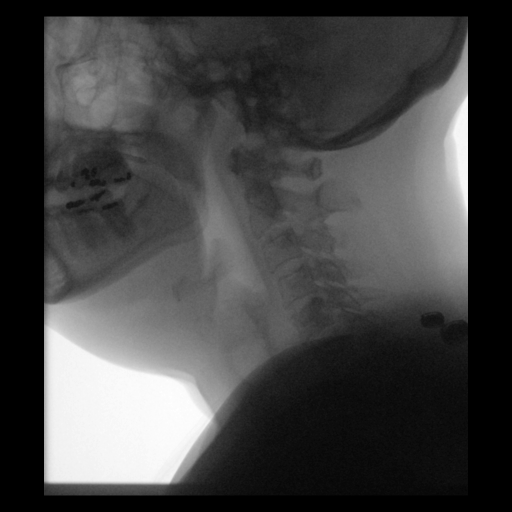
[frame 8/52]
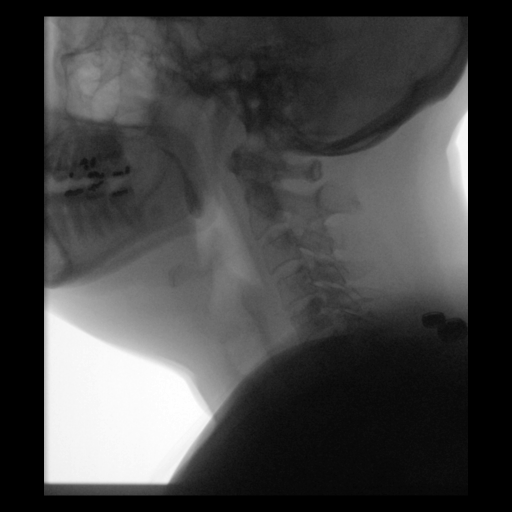
[frame 27/52]
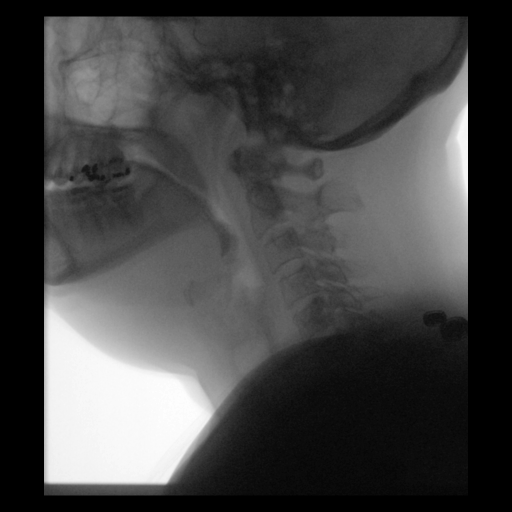
[frame 45/52]
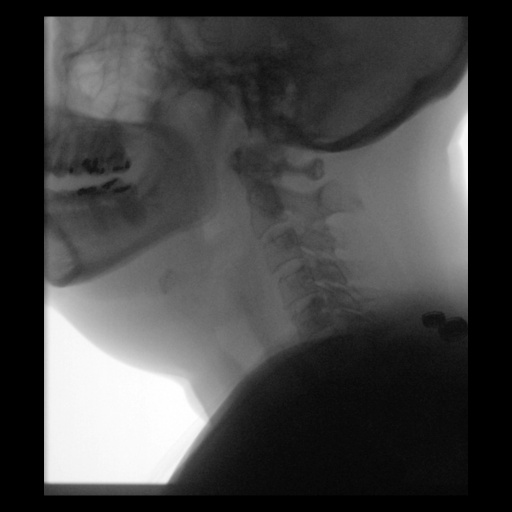

[Series 6: cp_standard · 0.34mm/px · 3 of 41 frames shown (6 of 6)]
[frame 21/41]
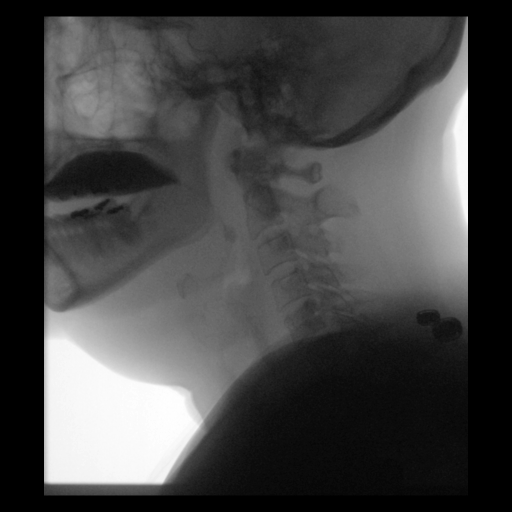
[frame 35/41]
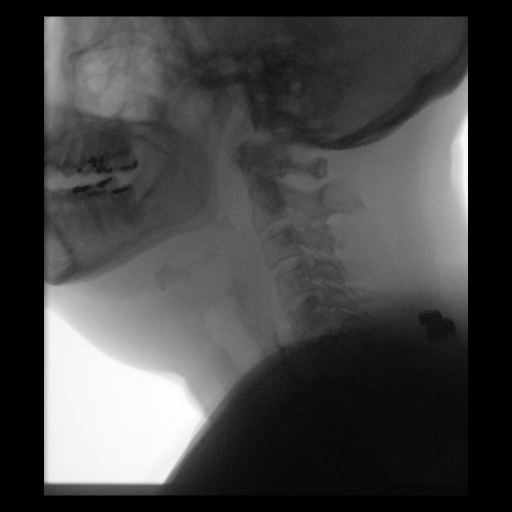
[frame 37/41]
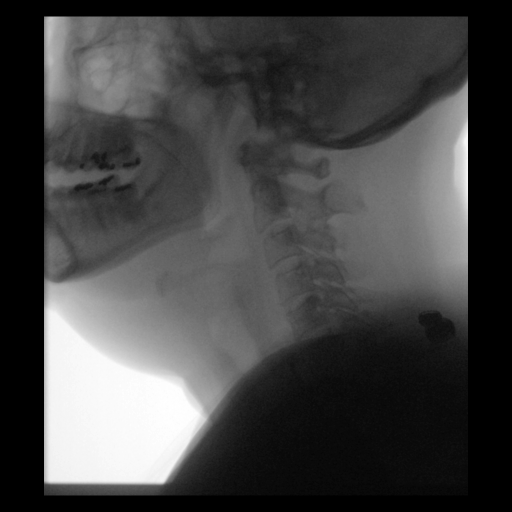

[20 of 23 positions shown; findings below may reference images not displayed]

FLUOROSCOPY FOR SWALLOWING FUNCTION STUDY:
Fluoroscopy was provided for swallowing function study, which was administered by a speech pathologist.  Final results and recommendations from this study are contained within the speech pathology report.

## 2018-01-21 ENCOUNTER — Encounter: Payer: Self-pay | Admitting: Genetics

## 2020-06-25 ENCOUNTER — Encounter: Payer: Self-pay | Admitting: Genetic Counselor
